# Patient Record
Sex: Female | Born: 1940
Health system: Southern US, Community
[De-identification: ages and names within clinical notes are randomized; demographics above are authoritative.]

## PROBLEM LIST (undated history)

## (undated) DIAGNOSIS — Z2239 Carrier of other specified bacterial diseases: Secondary | ICD-10-CM

## (undated) DIAGNOSIS — M3500 Sicca syndrome, unspecified: Secondary | ICD-10-CM

## (undated) DIAGNOSIS — J449 Chronic obstructive pulmonary disease, unspecified: Secondary | ICD-10-CM

## (undated) DIAGNOSIS — J189 Pneumonia, unspecified organism: Secondary | ICD-10-CM

## (undated) DIAGNOSIS — H9190 Unspecified hearing loss, unspecified ear: Secondary | ICD-10-CM

## (undated) DIAGNOSIS — I10 Essential (primary) hypertension: Secondary | ICD-10-CM

## (undated) DIAGNOSIS — R682 Dry mouth, unspecified: Secondary | ICD-10-CM

## (undated) DIAGNOSIS — M069 Rheumatoid arthritis, unspecified: Secondary | ICD-10-CM

## (undated) DIAGNOSIS — K117 Disturbances of salivary secretion: Secondary | ICD-10-CM

## (undated) DIAGNOSIS — R918 Other nonspecific abnormal finding of lung field: Secondary | ICD-10-CM

## (undated) DIAGNOSIS — J479 Bronchiectasis, uncomplicated: Secondary | ICD-10-CM

## (undated) DIAGNOSIS — Z9221 Personal history of antineoplastic chemotherapy: Secondary | ICD-10-CM

## (undated) DIAGNOSIS — C8590 Non-Hodgkin lymphoma, unspecified, unspecified site: Secondary | ICD-10-CM

## (undated) HISTORY — PX: APPENDECTOMY: SHX54

## (undated) HISTORY — DX: Carrier of other specified bacterial diseases: Z22.39

## (undated) HISTORY — PX: TUBAL LIGATION: SHX77

---

## 2001-11-21 DIAGNOSIS — C8590 Non-Hodgkin lymphoma, unspecified, unspecified site: Secondary | ICD-10-CM

## 2001-11-21 HISTORY — DX: Non-Hodgkin lymphoma, unspecified, unspecified site: C85.90

## 2004-11-10 ENCOUNTER — Ambulatory Visit: Payer: Self-pay | Admitting: Oncology

## 2004-11-21 ENCOUNTER — Ambulatory Visit: Payer: Self-pay | Admitting: Oncology

## 2004-12-28 ENCOUNTER — Ambulatory Visit: Payer: Self-pay | Admitting: Oncology

## 2005-03-30 ENCOUNTER — Ambulatory Visit: Payer: Self-pay | Admitting: Oncology

## 2005-04-21 ENCOUNTER — Ambulatory Visit: Payer: Self-pay | Admitting: Oncology

## 2005-08-25 ENCOUNTER — Ambulatory Visit: Payer: Self-pay | Admitting: Oncology

## 2005-08-26 ENCOUNTER — Ambulatory Visit: Payer: Self-pay | Admitting: Oncology

## 2005-09-21 ENCOUNTER — Ambulatory Visit: Payer: Self-pay | Admitting: Oncology

## 2006-01-19 ENCOUNTER — Ambulatory Visit: Payer: Self-pay | Admitting: Oncology

## 2006-02-09 ENCOUNTER — Ambulatory Visit: Payer: Self-pay

## 2006-02-19 ENCOUNTER — Ambulatory Visit: Payer: Self-pay | Admitting: Oncology

## 2006-04-07 ENCOUNTER — Ambulatory Visit: Payer: Self-pay | Admitting: Oncology

## 2006-04-25 ENCOUNTER — Ambulatory Visit: Payer: Self-pay | Admitting: Oncology

## 2006-04-28 ENCOUNTER — Ambulatory Visit: Payer: Self-pay | Admitting: Oncology

## 2006-06-09 ENCOUNTER — Ambulatory Visit: Payer: Self-pay | Admitting: Oncology

## 2006-06-14 ENCOUNTER — Ambulatory Visit: Payer: Self-pay | Admitting: Oncology

## 2006-09-05 ENCOUNTER — Ambulatory Visit: Payer: Self-pay | Admitting: Oncology

## 2006-09-14 ENCOUNTER — Ambulatory Visit: Payer: Self-pay | Admitting: Oncology

## 2006-09-21 ENCOUNTER — Ambulatory Visit: Payer: Self-pay | Admitting: Oncology

## 2006-12-23 ENCOUNTER — Emergency Department: Payer: Self-pay | Admitting: Unknown Physician Specialty

## 2007-01-12 ENCOUNTER — Ambulatory Visit: Payer: Self-pay | Admitting: Nurse Practitioner

## 2007-01-17 ENCOUNTER — Ambulatory Visit: Payer: Self-pay | Admitting: Oncology

## 2007-01-20 ENCOUNTER — Ambulatory Visit: Payer: Self-pay | Admitting: Internal Medicine

## 2007-01-22 ENCOUNTER — Ambulatory Visit: Payer: Self-pay | Admitting: Internal Medicine

## 2007-02-20 ENCOUNTER — Ambulatory Visit: Payer: Self-pay | Admitting: Internal Medicine

## 2007-03-21 ENCOUNTER — Ambulatory Visit: Payer: Self-pay | Admitting: Oncology

## 2007-03-22 ENCOUNTER — Ambulatory Visit: Payer: Self-pay | Admitting: Oncology

## 2007-03-22 ENCOUNTER — Ambulatory Visit: Payer: Self-pay | Admitting: Internal Medicine

## 2007-04-22 ENCOUNTER — Ambulatory Visit: Payer: Self-pay | Admitting: Oncology

## 2007-04-25 ENCOUNTER — Ambulatory Visit: Payer: Self-pay | Admitting: Oncology

## 2007-04-27 ENCOUNTER — Ambulatory Visit: Payer: Self-pay | Admitting: Oncology

## 2007-05-22 ENCOUNTER — Ambulatory Visit: Payer: Self-pay | Admitting: Oncology

## 2007-05-31 ENCOUNTER — Ambulatory Visit: Payer: Self-pay | Admitting: Internal Medicine

## 2007-07-12 ENCOUNTER — Ambulatory Visit: Payer: Self-pay | Admitting: Specialist

## 2007-11-22 ENCOUNTER — Ambulatory Visit: Payer: Self-pay | Admitting: Oncology

## 2007-11-30 ENCOUNTER — Ambulatory Visit: Payer: Self-pay | Admitting: Oncology

## 2007-12-23 ENCOUNTER — Ambulatory Visit: Payer: Self-pay | Admitting: Oncology

## 2008-02-20 ENCOUNTER — Ambulatory Visit: Payer: Self-pay | Admitting: Oncology

## 2008-02-28 ENCOUNTER — Ambulatory Visit: Payer: Self-pay | Admitting: Oncology

## 2008-03-06 ENCOUNTER — Ambulatory Visit: Payer: Self-pay | Admitting: Oncology

## 2008-03-21 ENCOUNTER — Ambulatory Visit: Payer: Self-pay | Admitting: Oncology

## 2008-03-31 ENCOUNTER — Emergency Department: Payer: Self-pay | Admitting: Emergency Medicine

## 2008-03-31 ENCOUNTER — Other Ambulatory Visit: Payer: Self-pay

## 2008-04-08 ENCOUNTER — Ambulatory Visit: Payer: Self-pay | Admitting: Rheumatology

## 2008-06-21 ENCOUNTER — Ambulatory Visit: Payer: Self-pay | Admitting: Oncology

## 2008-07-14 ENCOUNTER — Ambulatory Visit: Payer: Self-pay | Admitting: Oncology

## 2008-07-22 ENCOUNTER — Ambulatory Visit: Payer: Self-pay | Admitting: Oncology

## 2008-09-16 ENCOUNTER — Emergency Department: Payer: Self-pay | Admitting: Emergency Medicine

## 2008-09-21 ENCOUNTER — Ambulatory Visit: Payer: Self-pay | Admitting: Oncology

## 2008-09-24 ENCOUNTER — Inpatient Hospital Stay: Payer: Self-pay | Admitting: Internal Medicine

## 2008-10-13 ENCOUNTER — Ambulatory Visit: Payer: Self-pay | Admitting: Oncology

## 2008-10-21 ENCOUNTER — Ambulatory Visit: Payer: Self-pay | Admitting: Oncology

## 2008-11-10 ENCOUNTER — Ambulatory Visit: Payer: Self-pay | Admitting: Oncology

## 2008-11-21 ENCOUNTER — Ambulatory Visit: Payer: Self-pay | Admitting: Oncology

## 2008-12-24 ENCOUNTER — Ambulatory Visit: Payer: Self-pay | Admitting: Oncology

## 2008-12-26 ENCOUNTER — Ambulatory Visit: Payer: Self-pay | Admitting: Oncology

## 2009-03-21 ENCOUNTER — Ambulatory Visit: Payer: Self-pay | Admitting: Oncology

## 2009-04-07 ENCOUNTER — Emergency Department: Payer: Self-pay | Admitting: Emergency Medicine

## 2009-04-17 ENCOUNTER — Ambulatory Visit: Payer: Self-pay | Admitting: Oncology

## 2009-04-21 ENCOUNTER — Ambulatory Visit: Payer: Self-pay | Admitting: Oncology

## 2009-05-21 ENCOUNTER — Ambulatory Visit: Payer: Self-pay | Admitting: Oncology

## 2009-05-28 ENCOUNTER — Ambulatory Visit: Payer: Self-pay | Admitting: Oncology

## 2009-06-21 ENCOUNTER — Ambulatory Visit: Payer: Self-pay | Admitting: Oncology

## 2009-11-21 ENCOUNTER — Ambulatory Visit: Payer: Self-pay | Admitting: Internal Medicine

## 2009-11-21 DIAGNOSIS — J479 Bronchiectasis, uncomplicated: Secondary | ICD-10-CM

## 2009-11-21 HISTORY — DX: Bronchiectasis, uncomplicated: J47.9

## 2009-12-14 ENCOUNTER — Ambulatory Visit: Payer: Self-pay | Admitting: Oncology

## 2009-12-22 ENCOUNTER — Ambulatory Visit: Payer: Self-pay | Admitting: Internal Medicine

## 2009-12-22 ENCOUNTER — Ambulatory Visit: Payer: Self-pay | Admitting: Oncology

## 2010-05-07 ENCOUNTER — Ambulatory Visit: Payer: Self-pay | Admitting: Internal Medicine

## 2010-06-25 ENCOUNTER — Ambulatory Visit: Payer: Self-pay | Admitting: Endocrinology

## 2010-09-27 ENCOUNTER — Inpatient Hospital Stay: Payer: Self-pay | Admitting: Endocrinology

## 2011-04-16 ENCOUNTER — Emergency Department: Payer: Self-pay | Admitting: Emergency Medicine

## 2015-02-10 ENCOUNTER — Ambulatory Visit: Payer: Self-pay | Admitting: Rheumatology

## 2015-02-18 DIAGNOSIS — G629 Polyneuropathy, unspecified: Secondary | ICD-10-CM | POA: Insufficient documentation

## 2015-02-18 DIAGNOSIS — R29898 Other symptoms and signs involving the musculoskeletal system: Secondary | ICD-10-CM | POA: Insufficient documentation

## 2015-02-19 DIAGNOSIS — G729 Myopathy, unspecified: Secondary | ICD-10-CM | POA: Insufficient documentation

## 2015-03-25 DIAGNOSIS — R200 Anesthesia of skin: Secondary | ICD-10-CM | POA: Insufficient documentation

## 2015-03-25 DIAGNOSIS — R202 Paresthesia of skin: Secondary | ICD-10-CM | POA: Insufficient documentation

## 2016-01-25 ENCOUNTER — Other Ambulatory Visit: Payer: Self-pay | Admitting: Internal Medicine

## 2016-01-25 ENCOUNTER — Other Ambulatory Visit: Payer: Self-pay | Admitting: Physician Assistant

## 2016-01-25 DIAGNOSIS — R059 Cough, unspecified: Secondary | ICD-10-CM

## 2016-01-25 DIAGNOSIS — R0602 Shortness of breath: Secondary | ICD-10-CM

## 2016-01-25 DIAGNOSIS — R05 Cough: Secondary | ICD-10-CM

## 2016-02-02 ENCOUNTER — Ambulatory Visit
Admission: RE | Admit: 2016-02-02 | Discharge: 2016-02-02 | Disposition: A | Discharge status: Home / Self Care | Payer: Medicare Other | Source: Ambulatory Visit | Attending: Physician Assistant | Admitting: Physician Assistant

## 2016-02-02 DIAGNOSIS — R059 Cough, unspecified: Secondary | ICD-10-CM

## 2016-02-02 DIAGNOSIS — R918 Other nonspecific abnormal finding of lung field: Secondary | ICD-10-CM | POA: Insufficient documentation

## 2016-02-02 DIAGNOSIS — R0602 Shortness of breath: Secondary | ICD-10-CM | POA: Diagnosis not present

## 2016-02-02 DIAGNOSIS — R05 Cough: Secondary | ICD-10-CM | POA: Diagnosis present

## 2016-02-02 DIAGNOSIS — K8020 Calculus of gallbladder without cholecystitis without obstruction: Secondary | ICD-10-CM | POA: Diagnosis not present

## 2016-02-02 DIAGNOSIS — J479 Bronchiectasis, uncomplicated: Secondary | ICD-10-CM | POA: Diagnosis not present

## 2016-02-20 HISTORY — PX: BRONCHOSCOPY: SUR163

## 2016-03-16 ENCOUNTER — Encounter
Admission: RE | Disposition: A | Discharge status: Home / Self Care | Payer: Self-pay | Source: Ambulatory Visit | Attending: Internal Medicine

## 2016-03-16 ENCOUNTER — Ambulatory Visit
Admission: RE | Admit: 2016-03-16 | Discharge: 2016-03-16 | Disposition: A | Discharge status: Home / Self Care | Payer: Medicare Other | Source: Ambulatory Visit | Attending: Internal Medicine | Admitting: Internal Medicine

## 2016-03-16 DIAGNOSIS — Z888 Allergy status to other drugs, medicaments and biological substances status: Secondary | ICD-10-CM | POA: Diagnosis not present

## 2016-03-16 DIAGNOSIS — Z8572 Personal history of non-Hodgkin lymphomas: Secondary | ICD-10-CM | POA: Diagnosis not present

## 2016-03-16 DIAGNOSIS — Z79899 Other long term (current) drug therapy: Secondary | ICD-10-CM | POA: Insufficient documentation

## 2016-03-16 DIAGNOSIS — Z9221 Personal history of antineoplastic chemotherapy: Secondary | ICD-10-CM | POA: Diagnosis not present

## 2016-03-16 DIAGNOSIS — M069 Rheumatoid arthritis, unspecified: Secondary | ICD-10-CM | POA: Diagnosis not present

## 2016-03-16 DIAGNOSIS — J449 Chronic obstructive pulmonary disease, unspecified: Secondary | ICD-10-CM | POA: Insufficient documentation

## 2016-03-16 DIAGNOSIS — J9811 Atelectasis: Secondary | ICD-10-CM | POA: Diagnosis not present

## 2016-03-16 DIAGNOSIS — J479 Bronchiectasis, uncomplicated: Secondary | ICD-10-CM | POA: Diagnosis present

## 2016-03-16 DIAGNOSIS — R911 Solitary pulmonary nodule: Secondary | ICD-10-CM | POA: Diagnosis present

## 2016-03-16 HISTORY — DX: Rheumatoid arthritis, unspecified: M06.9

## 2016-03-16 HISTORY — DX: Non-Hodgkin lymphoma, unspecified, unspecified site: C85.90

## 2016-03-16 HISTORY — DX: Disturbances of salivary secretion: K11.7

## 2016-03-16 HISTORY — PX: FLEXIBLE BRONCHOSCOPY: SHX5094

## 2016-03-16 HISTORY — DX: Dry mouth, unspecified: R68.2

## 2016-03-16 SURGERY — BRONCHOSCOPY, FLEXIBLE
Anesthesia: Moderate Sedation | Laterality: Bilateral

## 2016-03-16 MED ORDER — MIDAZOLAM HCL 5 MG/5ML IJ SOLN
INTRAMUSCULAR | Status: AC
  Filled 2016-03-16: qty 10

## 2016-03-16 MED ORDER — FENTANYL CITRATE (PF) 100 MCG/2ML IJ SOLN
INTRAMUSCULAR | Status: AC | PRN
  Administered 2016-03-16: 50 ug via INTRAVENOUS

## 2016-03-16 MED ORDER — IPRATROPIUM-ALBUTEROL 0.5-2.5 (3) MG/3ML IN SOLN
3.0000 mL | Freq: Once | RESPIRATORY_TRACT | Status: AC
  Administered 2016-03-16: 3 mL via RESPIRATORY_TRACT

## 2016-03-16 MED ORDER — FENTANYL CITRATE (PF) 100 MCG/2ML IJ SOLN
INTRAMUSCULAR | Status: AC
  Filled 2016-03-16: qty 4

## 2016-03-16 MED ORDER — IPRATROPIUM-ALBUTEROL 0.5-2.5 (3) MG/3ML IN SOLN
RESPIRATORY_TRACT | Status: AC
  Administered 2016-03-16: 3 mL
  Filled 2016-03-16: qty 3

## 2016-03-16 MED ORDER — MIDAZOLAM HCL 2 MG/2ML IJ SOLN
INTRAMUSCULAR | Status: AC | PRN
  Administered 2016-03-16: 2 mg via INTRAVENOUS

## 2016-03-16 MED ORDER — SODIUM CHLORIDE 0.9 % IV SOLN
INTRAVENOUS | Status: DC
  Administered 2016-03-16: 13:00:00 via INTRAVENOUS

## 2016-03-16 NOTE — Op Note (Signed)
[facility] Patient Name: Breanna Christian Procedure Date: 03/16/2016 12:43 PM MRN: [id number] Account #: [id number] Date of Birth: [birth year] Admit Type: Outpatient Age: 75 Room: [facility] PROCEDURE RM 01 Gender: Female Note Status: Finalized Attending MD: Allyne Gee , MD Procedure:         Bronchoscopy Indications:       Bilateral atelectasis Providers:         Allyne Gee, MD Referring MD:       Medicines:         Midazolam 2 mg IV, Fentanyl 50 mcg IV Complications:     No immediate complications Procedure:         Pre-Anesthesia Assessment:                    - A History and Physical has been performed. The patient's                     medications, allergies and sensitivities have been                     reviewed.                    - The risks and benefits of the procedure and the sedation                     options and risks were discussed with the patient. All                     questions were answered and informed consent was obtained.                    - Pre-procedure physical examination revealed no                     contraindications to sedation.                    - ASA Grade Assessment: II - A patient with mild systemic                     disease.                    - After reviewing the risks and benefits, the patient was                     deemed in satisfactory condition to undergo the procedure.                    - The anesthesia plan was to use moderate                     sedation/analgesia.                    - Immediately prior to administration of medications, the                     patient was re-assessed for adequacy to receive sedatives.                    - The heart rate, respiratory rate, oxygen saturations,                     blood pressure, adequacy of pulmonary ventilation, and  response to care were monitored throughout the procedure.                    - The physical status of the patient was  re-assessed after                     the procedure.                    After obtaining informed consent, the bronchoscope was                     passed under direct vision. Throughout the procedure, the                     patient's blood pressure, pulse, and oxygen saturations                     were monitored continuously. the Bronchoscope Olympus                     [id number] was introduced through the right                     nostril and advanced to the tracheobronchial tree of both                     lungs. The procedure was accomplished without difficulty.                     The patient tolerated the procedure well. The total                     duration of the procedure was 20 minutes. Findings:      The nasopharynx/oropharynx appears normal. The larynx appears normal.       The vocal cords appear normal. The subglottic space is normal. The       trachea is of normal caliber. The carina is sharp. The tracheobronchial       tree of the left lung was examined to at least the first subsegmental       level. Bronchial mucosa and anatomy in the left lung are normal; there       are no endobronchial lesions, and no secretions.      The nasopharynx/oropharynx appears normal. The larynx appears normal.       The vocal cords appear normal. The subglottic space is normal. The       trachea is of normal caliber. The carina is sharp. The tracheobronchial       tree of the right lung was examined to at least the first subsegmental       level. Bronchial mucosa and anatomy in the right lung are normal; there       are no endobronchial lesions, and no secretions.      Bronchoalveolar lavage was performed in the RML lateral segment (B4) and       in the RML medial segment (B5) of the lung and sent for cell count,       bacterial culture, viral smears & culture, and fungal & AFB analysis and       cytology. 120 mL of fluid were instilled. 60 mL were returned. The       return was  blood-tinged. There were no mucoid plugs in the return fluid.       Multiple specimens were  obtained, and each sent for analysis.      Verification of patient identification for the specimen was done by the       physician and nurse using the patient's name, birth date and medical       record number. Impression:        - Bilateral atelectasis                    - The left lung was normal.                    - The right lung was normal.                    - Bronchoalveolar lavage was performed.                    - The examination was normal. Recommendation:    - Await BAL results. Devona Konig, MD Allyne Gee, MD 03/16/2016 1:47:15 PM This report has been signed electronically. Number of Addenda: 0 Note Initiated On: 03/16/2016 12:43 PM      [facility]

## 2016-03-16 NOTE — Sedation Documentation (Signed)
Patient tolerated procedure well. Currently reporting no pain, total of 59mcg of fentanyl and 2mg  versed administered. Will transport to recovery.

## 2016-03-18 LAB — CYTOLOGY - NON PAP

## 2016-03-19 LAB — CULTURE, RESPIRATORY W GRAM STAIN: Special Requests: NORMAL

## 2016-03-19 LAB — CULTURE, RESPIRATORY

## 2016-04-06 LAB — CULTURE, FUNGUS WITHOUT SMEAR: Special Requests: NORMAL

## 2016-04-25 ENCOUNTER — Encounter: Payer: Medicare Other | Attending: Internal Medicine | Admitting: *Deleted

## 2016-04-25 VITALS — Ht 62.5 in | Wt 108.7 lb

## 2016-04-25 DIAGNOSIS — J449 Chronic obstructive pulmonary disease, unspecified: Secondary | ICD-10-CM | POA: Diagnosis not present

## 2016-04-26 NOTE — Patient Instructions (Signed)
Patient Instructions  Patient Details  Name: Breanna Christian MRN: [id number] Date of Birth: [birth year] Referring Provider:  Lavera Guise, MD  Below are the personal goals you chose as well as exercise and nutrition goals. Our goal is to help you keep on track towards obtaining and maintaining your goals. We will be discussing your progress on these goals with you throughout the program.  Initial Exercise Prescription:     Initial Exercise Prescription - 04/25/16 1500    Date of Initial Exercise RX and Referring Provider   Date 04/25/16   Treadmill   MPH 2   Grade 0   Minutes 10   NuStep   Level 3   Watts 30   Minutes 15   Biostep-RELP   Level 2   Watts 25   Minutes 10   Prescription Details   Frequency (times per week) 3   Duration Progress to 30 minutes of continuous aerobic without signs/symptoms of physical distress   Intensity   THRR 40-80% of Max Heartrate 135-149   Ratings of Perceived Exertion 11-15   Perceived Dyspnea 0-4   Progression   Progression Continue progressive overload as per policy without signs/symptoms or physical distress.   Resistance Training   Training Prescription Yes   Weight 2   Reps 10-15      Exercise Goals: Frequency: Be able to perform aerobic exercise three times per week working toward 3-5 days per week.  Intensity: Work with a perceived exertion of 11 (fairly light) - 15 (hard) as tolerated. Follow your new exercise prescription and watch for changes in prescription as you progress with the program. Changes will be reviewed with you when they are made.  Duration: You should be able to do 30 minutes of continuous aerobic exercise in addition to a 5 minute warm-up and a 5 minute cool-down routine.  Nutrition Goals: Your personal nutrition goals will be established when you do your nutrition analysis with the dietician.  The following are nutrition guidelines to follow: Cholesterol < 200mg /day Sodium < 1500mg /day Fiber: Women  over 50 yrs - 21 grams per day  Personal Goals:     Personal Goals and Risk Factors at Admission - 04/25/16 1456    Core Components/Risk Factors/Patient Goals on Admission    Weight Management Weight Gain;Yes   Intervention Weight Management: Develop a combined nutrition and exercise program designed to reach desired caloric intake, while maintaining appropriate intake of nutrient and fiber, sodium and fats, and appropriate energy expenditure required for the weight goal.   Admit Weight 109 lb (49.442 kg)   Goal Weight: Short Term 115 lb (52.164 kg)   Goal Weight: Long Term 125 lb (56.7 kg)   Expected Outcomes Short Term: Continue to assess and modify interventions until short term weight is achieved;Long Term: Adherence to nutrition and physical activity/exercise program aimed toward attainment of established weight goal;Weight Gain: Understanding of general recommendations for a high calorie, high protein meal plan that promotes weight gain by distributing calorie intake throughout the day with the consumption for 4-5 meals, snacks, and/or supplements   Sedentary Yes   Intervention Provide advice, education, support and counseling about physical activity/exercise needs.;Develop an individualized exercise prescription for aerobic and resistive training based on initial evaluation findings, risk stratification, comorbidities and participant's personal goals.   Expected Outcomes Achievement of increased cardiorespiratory fitness and enhanced flexibility, muscular endurance and strength shown through measurements of functional capacity and personal statement of participant.   Increase Strength and Stamina Yes  Intervention Provide advice, education, support and counseling about physical activity/exercise needs.;Develop an individualized exercise prescription for aerobic and resistive training based on initial evaluation findings, risk stratification, comorbidities and participant's personal goals.    Expected Outcomes Achievement of increased cardiorespiratory fitness and enhanced flexibility, muscular endurance and strength shown through measurements of functional capacity and personal statement of participant.   Improve shortness of breath with ADL's Yes   Intervention Provide education, individualized exercise plan and daily activity instruction to help decrease symptoms of SOB with activities of daily living.   Expected Outcomes Short Term: Achieves a reduction of symptoms when performing activities of daily living.   Develop more efficient breathing techniques such as purse lipped breathing and diaphragmatic breathing; and practicing self-pacing with activity Yes   Intervention Provide education, demonstration and support about specific breathing techniuqes utilized for more efficient breathing. Include techniques such as pursed lipped breathing, diaphragmatic breathing and self-pacing activity.   Expected Outcomes Short Term: Participant will be able to demonstrate and use breathing techniques as needed throughout daily activities.   Increase knowledge of respiratory medications and ability to use respiratory devices properly  Yes   Intervention Provide education and demonstration as needed of appropriate use of medications, inhalers, and oxygen therapy.   Expected Outcomes Short Term: Achieves understanding of medications use. Understands that oxygen is a medication prescribed by physician. Demonstrates appropriate use of inhaler and oxygen therapy.      Tobacco Use Initial Evaluation: History  Smoking status  . Never Smoker   Smokeless tobacco  . Not on file    Copy of goals given to participant.

## 2016-04-26 NOTE — Progress Notes (Signed)
Pulmonary Individual Treatment Plan  Patient Details  Name: BRAELYN HINCK MRN: [id number] Date of Birth: [birth year] Referring Provider:    Initial Encounter Date:       Pulmonary Rehab from 04/25/2016 in [facility] Cardiac and Pulmonary Rehab   Date  04/25/16      Visit Diagnosis: COPD, moderate ([place])  Patient's Home Medications on Admission:  Current outpatient prescriptions:  .  aspirin EC 81 MG tablet, Take by mouth., Disp: , Rfl:  .  Calcium Carbonate-Vitamin D (CALCIUM-VITAMIN D) 500-200 MG-UNIT tablet, Take by mouth., Disp: , Rfl:  .  Cyanocobalamin (RA VITAMIN B-12 TR) 1000 MCG TBCR, Take by mouth., Disp: , Rfl:  .  diltiazem (TIAZAC) 240 MG 24 hr capsule, , Disp: , Rfl:  .  Fluticasone-Salmeterol (ADVAIR) 250-50 MCG/DOSE AEPB, Inhale 1 puff into the lungs 2 (two) times daily., Disp: , Rfl:  .  folic acid (FOLVITE) 1 MG tablet, Take by mouth., Disp: , Rfl:  .  ipratropium-albuterol (DUONEB) 0.5-2.5 (3) MG/3ML SOLN, Take 3 mLs by nebulization., Disp: , Rfl:  .  Multiple Vitamin (MULTI-VITAMINS) TABS, Take by mouth. Reported on 04/25/2016, Disp: , Rfl:  .  roflumilast (DALIRESP) 500 MCG TABS tablet, Take 500 mcg by mouth daily., Disp: , Rfl:  .  tiotropium (SPIRIVA) 18 MCG inhalation capsule, Place 18 mcg into inhaler and inhale daily., Disp: , Rfl:  .  triamterene-hydrochlorothiazide (DYAZIDE) 37.5-25 MG capsule, Take by mouth., Disp: , Rfl:  .  Vitamin D, Ergocalciferol, (DRISDOL) 50000 units CAPS capsule, Take by mouth., Disp: , Rfl:  .  benzonatate (TESSALON) 100 MG capsule, Take by mouth 3 (three) times daily as needed for cough., Disp: , Rfl:  .  predniSONE (DELTASONE) 10 MG tablet, Take 10 mg by mouth daily with breakfast. Reported on 04/25/2016, Disp: , Rfl:   Past Medical History: Past Medical History  Diagnosis Date  . Non-Hodgkin lymphoma ([place]) 2003  . Rheumatoid arthritis ([place])   . Xerostomia     Tobacco Use: History  Smoking status  . Never Smoker   Smokeless  tobacco  . Not on file    Labs: Recent Review Flowsheet Data    There is no flowsheet data to display.       ADL [facility]:     Pulmonary Assessment Scores      04/26/16 1021       ADL [facility]   ADL Phase Entry     SOB Score total 48     Rest 1     Walk 2     Stairs 5     Bath 2     Dress 1     Shop 3        Pulmonary Function Assessment:     Pulmonary Function Assessment - 02/10/16 1017    Pulmonary Function Tests   RV% 154 %   DLCO% 47 %   Initial Spirometry Results   FVC% 55 %   FEV1% 53 %   FEV1/FVC Ratio 77   Post Bronchodilator Spirometry Results   FVC% 68 %   FEV1% 56 %   FEV1/FVC Ratio 66   Breath   Shortness of Breath Yes;Limiting activity      Exercise Target Goals: Date: 04/25/16  Exercise Program Goal: Individual exercise prescription set with THRR, safety & activity barriers. Participant demonstrates ability to understand and report RPE using BORG scale, to self-measure pulse accurately, and to acknowledge the importance of the exercise prescription.  Exercise Prescription Goal: Starting with aerobic  activity 30 plus minutes a day, 3 days per week for initial exercise prescription. Provide home exercise prescription and guidelines that participant acknowledges understanding prior to discharge.  Activity Barriers & Risk Stratification:     Activity Barriers & Cardiac Risk Stratification - 04/25/16 1445    Activity Barriers & Cardiac Risk Stratification   Activity Barriers Deconditioning;Muscular Weakness;Shortness of Breath      6 Minute Walk:     6 Minute Walk      04/25/16 1542       6 Minute Walk   Phase Initial     Distance 1160 feet     Distance % Change 0 %     Walk Time 6 minutes     # of Rest Breaks 0     MPH 2.2     METS 2.68     RPE 12     Perceived Dyspnea  2     Symptoms No     Resting HR 114 bpm     Resting BP 104/62 mmHg     Max Ex. HR 140 bpm     Max Ex. BP 130/62 mmHg        Initial Exercise  Prescription:     Initial Exercise Prescription - 04/25/16 1500    Date of Initial Exercise RX and Referring Provider   Date 04/25/16   Treadmill   MPH 2   Grade 0   Minutes 10   NuStep   Level 3   Watts 30   Minutes 15   Biostep-RELP   Level 2   Watts 25   Minutes 10   Prescription Details   Frequency (times per week) 3   Duration Progress to 30 minutes of continuous aerobic without signs/symptoms of physical distress   Intensity   THRR 40-80% of Max Heartrate 135-149   Ratings of Perceived Exertion 11-15   Perceived Dyspnea 0-4   Progression   Progression Continue progressive overload as per policy without signs/symptoms or physical distress.   Resistance Training   Training Prescription Yes   Weight 2   Reps 10-15      Perform Capillary Blood Glucose checks as needed.  Exercise Prescription Changes:   Exercise Comments:   Discharge Exercise Prescription (Final Exercise Prescription Changes):    Nutrition:  Target Goals: Understanding of nutrition guidelines, daily intake of sodium 1500mg , cholesterol 200mg , calories 30% from fat and 7% or less from saturated fats, daily to have 5 or more servings of fruits and vegetables.  Biometrics:     Pre Biometrics - 04/25/16 1554    Pre Biometrics   Height 5' 2.5" (1.588 m)   Weight 108 lb 11.2 oz (49.306 kg)   Waist Circumference 28.3 inches   Hip Circumference 34 inches   Waist to Hip Ratio 0.83 %   BMI (Calculated) 19.6       Nutrition Therapy Plan and Nutrition Goals:     Nutrition Therapy & Goals - 04/25/16 1440    Intervention Plan   Intervention Prescribe, educate and counsel regarding individualized specific dietary modifications aiming towards targeted core components such as weight, hypertension, lipid management, diabetes, heart failure and other comorbidities.   Expected Outcomes Short Term Goal: Understand basic principles of dietary content, such as calories, fat, sodium, cholesterol and  nutrients.;Short Term Goal: A plan has been developed with personal nutrition goals set during dietitian appointment.;Long Term Goal: Adherence to prescribed nutrition plan.      Nutrition Discharge: Rate Your Plate Scores:  Psychosocial: Target Goals: Acknowledge presence or absence of depression, maximize coping skills, provide positive support system. Participant is able to verbalize types and ability to use techniques and skills needed for reducing stress and depression.  Initial Review & Psychosocial Screening:     Initial Psych Review & Screening - 04/25/16 1441    Initial Review   Current issues with Current Sleep Concerns   Family Dynamics   Good Support System? Yes  Children nearby   Concerns Recent loss of significant other   Comments Spouse died this past 15-Mar-2023  Married since 1973/03/14         Barriers   Psychosocial barriers to participate in program There are no identifiable barriers or psychosocial needs.;The patient should benefit from training in stress management and relaxation.   Screening Interventions   Interventions Encouraged to exercise      Quality of Life Scores:     Quality of Life - 04/26/16 1027    Quality of Life Scores   Health/Function Pre 11.53 %   Socioeconomic Pre 17.1 %   Psych/Spiritual Pre 23.71 %   Family Pre 25.5 %   GLOBAL Pre 16.98 %      PHQ-9:     Recent Review Flowsheet Data    Depression screen [facility] 2/9 04/25/2016   Decreased Interest 0   Down, Depressed, Hopeless 0   PHQ - 2 Score 0   Altered sleeping 3   Tired, decreased energy 3   Change in appetite 3   Feeling bad or failure about yourself  0   Trouble concentrating 0   Moving slowly or fidgety/restless 0   Suicidal thoughts 0   PHQ-9 Score 9   Difficult doing work/chores Somewhat difficult      Psychosocial Evaluation and Intervention:   Psychosocial Re-Evaluation:  Education: Education Goals: Education classes will be provided on a weekly basis, covering  required topics. Participant will state understanding/return demonstration of topics presented.  Learning Barriers/Preferences:     Learning Barriers/Preferences - 04/25/16 1445    Learning Barriers/Preferences   Learning Barriers None   Learning Preferences None      Education Topics: Initial Evaluation Education: - Verbal, written and demonstration of respiratory meds, RPE/PD scales, oximetry and breathing techniques. Instruction on use of nebulizers and MDIs: cleaning and proper use, rinsing mouth with steroid doses and importance of monitoring MDI activations.   General Nutrition Guidelines/Fats and Fiber: -Group instruction provided by verbal, written material, models and posters to present the general guidelines for heart healthy nutrition. Gives an explanation and review of dietary fats and fiber.   Controlling Sodium/Reading Food Labels: -Group verbal and written material supporting the discussion of sodium use in heart healthy nutrition. Review and explanation with models, verbal and written materials for utilization of the food label.   Exercise Physiology & Risk Factors: - Group verbal and written instruction with models to review the exercise physiology of the cardiovascular system and associated critical values. Details cardiovascular disease risk factors and the goals associated with each risk factor.   Aerobic Exercise & Resistance Training: - Gives group verbal and written discussion on the health impact of inactivity. On the components of aerobic and resistive training programs and the benefits of this training and how to safely progress through these programs.   Flexibility, Balance, General Exercise Guidelines: - Provides group verbal and written instruction on the benefits of flexibility and balance training programs. Provides general exercise guidelines with specific guidelines to those with heart or lung disease.  Demonstration and skill practice  provided.   Stress Management: - Provides group verbal and written instruction about the health risks of elevated stress, cause of high stress, and healthy ways to reduce stress.   Depression: - Provides group verbal and written instruction on the correlation between heart/lung disease and depressed mood, treatment options, and the stigmas associated with seeking treatment.   Exercise & Equipment Safety: - Individual verbal instruction and demonstration of equipment use and safety with use of the equipment.          Pulmonary Rehab from 04/25/2016 in [facility] Cardiac and Pulmonary Rehab   Date  04/25/16   Educator  SB   Instruction Review Code  2- meets goals/outcomes      Infection Prevention: - Provides verbal and written material to individual with discussion of infection control including proper hand washing and proper equipment cleaning during exercise session.      Pulmonary Rehab from 04/25/2016 in [facility] Cardiac and Pulmonary Rehab   Date  04/25/16   Educator  Sb   Instruction Review Code  2- meets goals/outcomes      Falls Prevention: - Provides verbal and written material to individual with discussion of falls prevention and safety.      Pulmonary Rehab from 04/25/2016 in [facility] Cardiac and Pulmonary Rehab   Date  04/25/16   Educator  Sb   Instruction Review Code  2- meets goals/outcomes      Diabetes: - Individual verbal and written instruction to review signs/symptoms of diabetes, desired ranges of glucose level fasting, after meals and with exercise. Advice that pre and post exercise glucose checks will be done for 3 sessions at entry of program.   Chronic Lung Diseases: - Group verbal and written instruction to review new updates, new respiratory medications, new advancements in procedures and treatments. Provide informative websites and "800" numbers of self-education.   Lung Procedures: - Group verbal and written instruction to describe testing methods done to  diagnose lung disease. Review the outcome of test results. Describe the treatment choices: Pulmonary Function Tests, ABGs and oximetry.   Energy Conservation: - Provide group verbal and written instruction for methods to conserve energy, plan and organize activities. Instruct on pacing techniques, use of adaptive equipment and posture/positioning to relieve shortness of breath.   Triggers: - Group verbal and written instruction to review types of environmental controls: home humidity, furnaces, filters, dust mite/pet prevention, HEPA vacuums. To discuss weather changes, air quality and the benefits of nasal washing.   Exacerbations: - Group verbal and written instruction to provide: warning signs, infection symptoms, calling MD promptly, preventive modes, and value of vaccinations. Review: effective airway clearance, coughing and/or vibration techniques. Create an Sports administrator.   Oxygen: - Individual and group verbal and written instruction on oxygen therapy. Includes supplement oxygen, available portable oxygen systems, continuous and intermittent flow rates, oxygen safety, concentrators, and Medicare reimbursement for oxygen.   Respiratory Medications: - Group verbal and written instruction to review medications for lung disease. Drug class, frequency, complications, importance of spacers, rinsing mouth after steroid MDI's, and proper cleaning methods for nebulizers.   AED/CPR: - Group verbal and written instruction with the use of models to demonstrate the basic use of the AED with the basic ABC's of resuscitation.   Breathing Retraining: - Provides individuals verbal and written instruction on purpose, frequency, and proper technique of diaphragmatic breathing and pursed-lipped breathing. Applies individual practice skills.   Anatomy and Physiology of the Lungs: - Group verbal and written  instruction with the use of models to provide basic lung anatomy and physiology related to  function, structure and complications of lung disease.   Heart Failure: - Group verbal and written instruction on the basics of heart failure: signs/symptoms, treatments, explanation of ejection fraction, enlarged heart and cardiomyopathy.   Sleep Apnea: - Individual verbal and written instruction to review Obstructive Sleep Apnea. Review of risk factors, methods for diagnosing and types of masks and machines for OSA.   Anxiety: - Provides group, verbal and written instruction on the correlation between heart/lung disease and anxiety, treatment options, and management of anxiety.   Relaxation: - Provides group, verbal and written instruction about the benefits of relaxation for patients with heart/lung disease. Also provides patients with examples of relaxation techniques.   Knowledge Questionnaire Score:     Knowledge Questionnaire Score - 04/26/16 1013    Knowledge Questionnaire Score   Pre Score 3/10       Core Components/Risk Factors/Patient Goals at Admission:     Personal Goals and Risk Factors at Admission - 04/25/16 1456    Core Components/Risk Factors/Patient Goals on Admission    Weight Management Weight Gain;Yes   Intervention Weight Management: Develop a combined nutrition and exercise program designed to reach desired caloric intake, while maintaining appropriate intake of nutrient and fiber, sodium and fats, and appropriate energy expenditure required for the weight goal.   Admit Weight 109 lb (49.442 kg)   Goal Weight: Short Term 115 lb (52.164 kg)   Goal Weight: Long Term 125 lb (56.7 kg)   Expected Outcomes Short Term: Continue to assess and modify interventions until short term weight is achieved;Long Term: Adherence to nutrition and physical activity/exercise program aimed toward attainment of established weight goal;Weight Gain: Understanding of general recommendations for a high calorie, high protein meal plan that promotes weight gain by distributing  calorie intake throughout the day with the consumption for 4-5 meals, snacks, and/or supplements   Sedentary Yes   Intervention Provide advice, education, support and counseling about physical activity/exercise needs.;Develop an individualized exercise prescription for aerobic and resistive training based on initial evaluation findings, risk stratification, comorbidities and participant's personal goals.   Expected Outcomes Achievement of increased cardiorespiratory fitness and enhanced flexibility, muscular endurance and strength shown through measurements of functional capacity and personal statement of participant.   Increase Strength and Stamina Yes   Intervention Provide advice, education, support and counseling about physical activity/exercise needs.;Develop an individualized exercise prescription for aerobic and resistive training based on initial evaluation findings, risk stratification, comorbidities and participant's personal goals.   Expected Outcomes Achievement of increased cardiorespiratory fitness and enhanced flexibility, muscular endurance and strength shown through measurements of functional capacity and personal statement of participant.   Improve shortness of breath with ADL's Yes   Intervention Provide education, individualized exercise plan and daily activity instruction to help decrease symptoms of SOB with activities of daily living.   Expected Outcomes Short Term: Achieves a reduction of symptoms when performing activities of daily living.   Develop more efficient breathing techniques such as purse lipped breathing and diaphragmatic breathing; and practicing self-pacing with activity Yes   Intervention Provide education, demonstration and support about specific breathing techniuqes utilized for more efficient breathing. Include techniques such as pursed lipped breathing, diaphragmatic breathing and self-pacing activity.   Expected Outcomes Short Term: Participant will be able to  demonstrate and use breathing techniques as needed throughout daily activities.   Increase knowledge of respiratory medications and ability to use respiratory devices properly  Yes   Intervention Provide education and demonstration as needed of appropriate use of medications, inhalers, and oxygen therapy.   Expected Outcomes Short Term: Achieves understanding of medications use. Understands that oxygen is a medication prescribed by physician. Demonstrates appropriate use of inhaler and oxygen therapy.      Core Components/Risk Factors/Patient Goals Review:    Core Components/Risk Factors/Patient Goals at Discharge (Final Review):    ITP Comments:     ITP Comments      04/25/16 1439           ITP Comments Initial ITP completed today.            Comments:

## 2016-05-02 ENCOUNTER — Encounter: Payer: Self-pay | Admitting: *Deleted

## 2016-05-02 ENCOUNTER — Encounter: Payer: Medicare Other | Admitting: *Deleted

## 2016-05-02 DIAGNOSIS — J449 Chronic obstructive pulmonary disease, unspecified: Secondary | ICD-10-CM

## 2016-05-02 NOTE — Progress Notes (Signed)
Pulmonary Individual Treatment Plan  Patient Details  Name: Breanna Christian MRN: [id number] Date of Birth: 1941-08-12 Referring Provider:    Initial Encounter Date:       Pulmonary Rehab from 04/25/2016 in [facility] Cardiac and Pulmonary Rehab   Date  04/25/16      Visit Diagnosis: COPD, moderate ([place])  Patient's Home Medications on Admission:  Current outpatient prescriptions:  .  aspirin EC 81 MG tablet, Take by mouth., Disp: , Rfl:  .  benzonatate (TESSALON) 100 MG capsule, Take by mouth 3 (three) times daily as needed for cough., Disp: , Rfl:  .  Calcium Carbonate-Vitamin D (CALCIUM-VITAMIN D) 500-200 MG-UNIT tablet, Take by mouth., Disp: , Rfl:  .  Cyanocobalamin (RA VITAMIN B-12 TR) 1000 MCG TBCR, Take by mouth., Disp: , Rfl:  .  diltiazem (TIAZAC) 240 MG 24 hr capsule, , Disp: , Rfl:  .  Fluticasone-Salmeterol (ADVAIR) 250-50 MCG/DOSE AEPB, Inhale 1 puff into the lungs 2 (two) times daily., Disp: , Rfl:  .  folic acid (FOLVITE) 1 MG tablet, Take by mouth., Disp: , Rfl:  .  ipratropium-albuterol (DUONEB) 0.5-2.5 (3) MG/3ML SOLN, Take 3 mLs by nebulization., Disp: , Rfl:  .  Multiple Vitamin (MULTI-VITAMINS) TABS, Take by mouth. Reported on 04/25/2016, Disp: , Rfl:  .  predniSONE (DELTASONE) 10 MG tablet, Take 10 mg by mouth daily with breakfast. Reported on 04/25/2016, Disp: , Rfl:  .  roflumilast (DALIRESP) 500 MCG TABS tablet, Take 500 mcg by mouth daily., Disp: , Rfl:  .  tiotropium (SPIRIVA) 18 MCG inhalation capsule, Place 18 mcg into inhaler and inhale daily., Disp: , Rfl:  .  triamterene-hydrochlorothiazide (DYAZIDE) 37.5-25 MG capsule, Take by mouth., Disp: , Rfl:  .  Vitamin D, Ergocalciferol, (DRISDOL) 50000 units CAPS capsule, Take by mouth., Disp: , Rfl:   Past Medical History: Past Medical History  Diagnosis Date  . Non-Hodgkin lymphoma ([place]) 2003  . Rheumatoid arthritis ([place])   . Xerostomia     Tobacco Use: History  Smoking status  . Never Smoker   Smokeless  tobacco  . Not on file    Labs: Recent Review Flowsheet Data    There is no flowsheet data to display.       ADL [facility]:     Pulmonary Assessment Scores      04/26/16 1021       ADL [facility]SD   ADL Phase Entry     SOB Score total 48     Rest 1     Walk 2     Stairs 5     Bath 2     Dress 1     Shop 3        Pulmonary Function Assessment:     Pulmonary Function Assessment - 02/10/16 1017    Pulmonary Function Tests   RV% 154 %   DLCO% 47 %   Initial Spirometry Results   FVC% 55 %   FEV1% 53 %   FEV1/FVC Ratio 77   Post Bronchodilator Spirometry Results   FVC% 68 %   FEV1% 56 %   FEV1/FVC Ratio 66   Breath   Shortness of Breath Yes;Limiting activity      Exercise Target Goals:    Exercise Program Goal: Individual exercise prescription set with THRR, safety & activity barriers. Participant demonstrates ability to understand and report RPE using BORG scale, to self-measure pulse accurately, and to acknowledge the importance of the exercise prescription.  Exercise Prescription Goal: Starting with aerobic  activity 30 plus minutes a day, 3 days per week for initial exercise prescription. Provide home exercise prescription and guidelines that participant acknowledges understanding prior to discharge.  Activity Barriers & Risk Stratification:     Activity Barriers & Cardiac Risk Stratification - 04/25/16 1445    Activity Barriers & Cardiac Risk Stratification   Activity Barriers Deconditioning;Muscular Weakness;Shortness of Breath      6 Minute Walk:     6 Minute Walk      04/25/16 1542       6 Minute Walk   Phase Initial     Distance 1160 feet     Distance % Change 0 %     Walk Time 6 minutes     # of Rest Breaks 0     MPH 2.2     METS 2.68     RPE 12     Perceived Dyspnea  2     Symptoms No     Resting HR 114 bpm     Resting BP 104/62 mmHg     Max Ex. HR 140 bpm     Max Ex. BP 130/62 mmHg        Initial Exercise Prescription:      Initial Exercise Prescription - 04/25/16 1500    Date of Initial Exercise RX and Referring Provider   Date 04/25/16   Treadmill   MPH 2   Grade 0   Minutes 10   NuStep   Level 3   Watts 30   Minutes 15   Biostep-RELP   Level 2   Watts 25   Minutes 10   Prescription Details   Frequency (times per week) 3   Duration Progress to 30 minutes of continuous aerobic without signs/symptoms of physical distress   Intensity   THRR 40-80% of Max Heartrate 135-149   Ratings of Perceived Exertion 11-15   Perceived Dyspnea 0-4   Progression   Progression Continue progressive overload as per policy without signs/symptoms or physical distress.   Resistance Training   Training Prescription Yes   Weight 2   Reps 10-15      Perform Capillary Blood Glucose checks as needed.  Exercise Prescription Changes:     Exercise Prescription Changes      05/02/16 1200           Response to Exercise   Blood Pressure (Admit) 124/70 mmHg       Blood Pressure (Exercise) 136/70 mmHg       Blood Pressure (Exit) 106/62 mmHg       Heart Rate (Admit) 95 bpm       Heart Rate (Exercise) 119 bpm       Heart Rate (Exit) 94 bpm       Oxygen Saturation (Admit) 97 %       Oxygen Saturation (Exercise) 93 %       Oxygen Saturation (Exit) 97 %       Rating of Perceived Exertion (Exercise) 11       Perceived Dyspnea (Exercise) 2       Symptoms none       Duration Progress to 30 minutes of continuous aerobic without signs/symptoms of physical distress       Intensity THRR unchanged  135-149       Progression   Progression Continue progressive overload as per policy without signs/symptoms or physical distress.       Resistance Training   Training Prescription Yes       Weight  2       Reps 10-15       Treadmill   MPH 2       Grade 0       Minutes 10       NuStep   Level 3  Omit today due to time. Was oriented to machine.        Watts 30       Minutes 15       Biostep-RELP   Level 2       Watts  25       Minutes 10          Exercise Comments:     Exercise Comments      05/02/16 1158           Exercise Comments First full day of exercise!  Patient was oriented to gym and equipment including functions, settings, policies, and procedures.  Patient's individual exercise prescription and treatment plan were reviewed.  All starting workloads were established based on the results of the 6 minute walk test done at initial orientation visit.  The plan for exercise progression was also introduced and progression will be customized based on patient's performance and goals.          Discharge Exercise Prescription (Final Exercise Prescription Changes):     Exercise Prescription Changes - 05/02/16 1200    Response to Exercise   Blood Pressure (Admit) 124/70 mmHg   Blood Pressure (Exercise) 136/70 mmHg   Blood Pressure (Exit) 106/62 mmHg   Heart Rate (Admit) 95 bpm   Heart Rate (Exercise) 119 bpm   Heart Rate (Exit) 94 bpm   Oxygen Saturation (Admit) 97 %   Oxygen Saturation (Exercise) 93 %   Oxygen Saturation (Exit) 97 %   Rating of Perceived Exertion (Exercise) 11   Perceived Dyspnea (Exercise) 2   Symptoms none   Duration Progress to 30 minutes of continuous aerobic without signs/symptoms of physical distress   Intensity THRR unchanged  135-149   Progression   Progression Continue progressive overload as per policy without signs/symptoms or physical distress.   Resistance Training   Training Prescription Yes   Weight 2   Reps 10-15   Treadmill   MPH 2   Grade 0   Minutes 10   NuStep   Level 3  Omit today due to time. Was oriented to machine.    Watts 30   Minutes 15   Biostep-RELP   Level 2   Watts 25   Minutes 10       Nutrition:  Target Goals: Understanding of nutrition guidelines, daily intake of sodium 1500mg , cholesterol 200mg , calories 30% from fat and 7% or less from saturated fats, daily to have 5 or more servings of fruits and  vegetables.  Biometrics:     Pre Biometrics - 04/25/16 1554    Pre Biometrics   Height 5' 2.5" (1.588 m)   Weight 108 lb 11.2 oz (49.306 kg)   Waist Circumference 28.3 inches   Hip Circumference 34 inches   Waist to Hip Ratio 0.83 %   BMI (Calculated) 19.6       Nutrition Therapy Plan and Nutrition Goals:     Nutrition Therapy & Goals - 04/25/16 1440    Intervention Plan   Intervention Prescribe, educate and counsel regarding individualized specific dietary modifications aiming towards targeted core components such as weight, hypertension, lipid management, diabetes, heart failure and other comorbidities.   Expected Outcomes Short Term Goal: Understand basic  principles of dietary content, such as calories, fat, sodium, cholesterol and nutrients.;Short Term Goal: A plan has been developed with personal nutrition goals set during dietitian appointment.;Long Term Goal: Adherence to prescribed nutrition plan.      Nutrition Discharge: Rate Your Plate Scores:   Psychosocial: Target Goals: Acknowledge presence or absence of depression, maximize coping skills, provide positive support system. Participant is able to verbalize types and ability to use techniques and skills needed for reducing stress and depression.  Initial Review & Psychosocial Screening:     Initial Psych Review & Screening - 04/25/16 1441    Initial Review   Current issues with Current Sleep Concerns   Family Dynamics   Good Support System? Yes  Children nearby   Concerns Recent loss of significant other   Comments Spouse died this past 2023-03-16  Married since 1973-03-15         Barriers   Psychosocial barriers to participate in program There are no identifiable barriers or psychosocial needs.;The patient should benefit from training in stress management and relaxation.   Screening Interventions   Interventions Encouraged to exercise      Quality of Life Scores:     Quality of Life - 04/26/16 1027    Quality  of Life Scores   Health/Function Pre 11.53 %   Socioeconomic Pre 17.1 %   Psych/Spiritual Pre 23.71 %   Family Pre 25.5 %   GLOBAL Pre 16.98 %      PHQ-9:     Recent Review Flowsheet Data    Depression screen [facility] 2/9 04/25/2016   Decreased Interest 0   Down, Depressed, Hopeless 0   PHQ - 2 Score 0   Altered sleeping 3   Tired, decreased energy 3   Change in appetite 3   Feeling bad or failure about yourself  0   Trouble concentrating 0   Moving slowly or fidgety/restless 0   Suicidal thoughts 0   PHQ-9 Score 9   Difficult doing work/chores Somewhat difficult      Psychosocial Evaluation and Intervention:   Psychosocial Re-Evaluation:  Education: Education Goals: Education classes will be provided on a weekly basis, covering required topics. Participant will state understanding/return demonstration of topics presented.  Learning Barriers/Preferences:     Learning Barriers/Preferences - 04/25/16 1445    Learning Barriers/Preferences   Learning Barriers None   Learning Preferences None      Education Topics: Initial Evaluation Education: - Verbal, written and demonstration of respiratory meds, RPE/PD scales, oximetry and breathing techniques. Instruction on use of nebulizers and MDIs: cleaning and proper use, rinsing mouth with steroid doses and importance of monitoring MDI activations.   General Nutrition Guidelines/Fats and Fiber: -Group instruction provided by verbal, written material, models and posters to present the general guidelines for heart healthy nutrition. Gives an explanation and review of dietary fats and fiber.   Controlling Sodium/Reading Food Labels: -Group verbal and written material supporting the discussion of sodium use in heart healthy nutrition. Review and explanation with models, verbal and written materials for utilization of the food label.   Exercise Physiology & Risk Factors: - Group verbal and written instruction with models to  review the exercise physiology of the cardiovascular system and associated critical values. Details cardiovascular disease risk factors and the goals associated with each risk factor.   Aerobic Exercise & Resistance Training: - Gives group verbal and written discussion on the health impact of inactivity. On the components of aerobic and resistive training programs and the benefits  of this training and how to safely progress through these programs.   Flexibility, Balance, General Exercise Guidelines: - Provides group verbal and written instruction on the benefits of flexibility and balance training programs. Provides general exercise guidelines with specific guidelines to those with heart or lung disease. Demonstration and skill practice provided.   Stress Management: - Provides group verbal and written instruction about the health risks of elevated stress, cause of high stress, and healthy ways to reduce stress.   Depression: - Provides group verbal and written instruction on the correlation between heart/lung disease and depressed mood, treatment options, and the stigmas associated with seeking treatment.   Exercise & Equipment Safety: - Individual verbal instruction and demonstration of equipment use and safety with use of the equipment.          Pulmonary Rehab from 04/25/2016 in [facility] Cardiac and Pulmonary Rehab   Date  04/25/16   Educator  SB   Instruction Review Code  2- meets goals/outcomes      Infection Prevention: - Provides verbal and written material to individual with discussion of infection control including proper hand washing and proper equipment cleaning during exercise session.      Pulmonary Rehab from 04/25/2016 in [facility] Cardiac and Pulmonary Rehab   Date  04/25/16   Educator  Sb   Instruction Review Code  2- meets goals/outcomes      Falls Prevention: - Provides verbal and written material to individual with discussion of falls prevention and safety.       Pulmonary Rehab from 04/25/2016 in [facility] Cardiac and Pulmonary Rehab   Date  04/25/16   Educator  Sb   Instruction Review Code  2- meets goals/outcomes      Diabetes: - Individual verbal and written instruction to review signs/symptoms of diabetes, desired ranges of glucose level fasting, after meals and with exercise. Advice that pre and post exercise glucose checks will be done for 3 sessions at entry of program.   Chronic Lung Diseases: - Group verbal and written instruction to review new updates, new respiratory medications, new advancements in procedures and treatments. Provide informative websites and "800" numbers of self-education.   Lung Procedures: - Group verbal and written instruction to describe testing methods done to diagnose lung disease. Review the outcome of test results. Describe the treatment choices: Pulmonary Function Tests, ABGs and oximetry.   Energy Conservation: - Provide group verbal and written instruction for methods to conserve energy, plan and organize activities. Instruct on pacing techniques, use of adaptive equipment and posture/positioning to relieve shortness of breath.   Triggers: - Group verbal and written instruction to review types of environmental controls: home humidity, furnaces, filters, dust mite/pet prevention, HEPA vacuums. To discuss weather changes, air quality and the benefits of nasal washing.   Exacerbations: - Group verbal and written instruction to provide: warning signs, infection symptoms, calling MD promptly, preventive modes, and value of vaccinations. Review: effective airway clearance, coughing and/or vibration techniques. Create an Sports administrator.   Oxygen: - Individual and group verbal and written instruction on oxygen therapy. Includes supplement oxygen, available portable oxygen systems, continuous and intermittent flow rates, oxygen safety, concentrators, and Medicare reimbursement for oxygen.   Respiratory Medications: -  Group verbal and written instruction to review medications for lung disease. Drug class, frequency, complications, importance of spacers, rinsing mouth after steroid MDI's, and proper cleaning methods for nebulizers.   AED/CPR: - Group verbal and written instruction with the use of models to demonstrate the basic use of  the AED with the basic ABC's of resuscitation.   Breathing Retraining: - Provides individuals verbal and written instruction on purpose, frequency, and proper technique of diaphragmatic breathing and pursed-lipped breathing. Applies individual practice skills.   Anatomy and Physiology of the Lungs: - Group verbal and written instruction with the use of models to provide basic lung anatomy and physiology related to function, structure and complications of lung disease.   Heart Failure: - Group verbal and written instruction on the basics of heart failure: signs/symptoms, treatments, explanation of ejection fraction, enlarged heart and cardiomyopathy.   Sleep Apnea: - Individual verbal and written instruction to review Obstructive Sleep Apnea. Review of risk factors, methods for diagnosing and types of masks and machines for OSA.   Anxiety: - Provides group, verbal and written instruction on the correlation between heart/lung disease and anxiety, treatment options, and management of anxiety.   Relaxation: - Provides group, verbal and written instruction about the benefits of relaxation for patients with heart/lung disease. Also provides patients with examples of relaxation techniques.   Knowledge Questionnaire Score:     Knowledge Questionnaire Score - 04/26/16 1013    Knowledge Questionnaire Score   Pre Score 3/10       Core Components/Risk Factors/Patient Goals at Admission:     Personal Goals and Risk Factors at Admission - 04/25/16 1456    Core Components/Risk Factors/Patient Goals on Admission    Weight Management Weight Gain;Yes   Intervention Weight  Management: Develop a combined nutrition and exercise program designed to reach desired caloric intake, while maintaining appropriate intake of nutrient and fiber, sodium and fats, and appropriate energy expenditure required for the weight goal.   Admit Weight 109 lb (49.442 kg)   Goal Weight: Short Term 115 lb (52.164 kg)   Goal Weight: Long Term 125 lb (56.7 kg)   Expected Outcomes Short Term: Continue to assess and modify interventions until short term weight is achieved;Long Term: Adherence to nutrition and physical activity/exercise program aimed toward attainment of established weight goal;Weight Gain: Understanding of general recommendations for a high calorie, high protein meal plan that promotes weight gain by distributing calorie intake throughout the day with the consumption for 4-5 meals, snacks, and/or supplements   Sedentary Yes   Intervention Provide advice, education, support and counseling about physical activity/exercise needs.;Develop an individualized exercise prescription for aerobic and resistive training based on initial evaluation findings, risk stratification, comorbidities and participant's personal goals.   Expected Outcomes Achievement of increased cardiorespiratory fitness and enhanced flexibility, muscular endurance and strength shown through measurements of functional capacity and personal statement of participant.   Increase Strength and Stamina Yes   Intervention Provide advice, education, support and counseling about physical activity/exercise needs.;Develop an individualized exercise prescription for aerobic and resistive training based on initial evaluation findings, risk stratification, comorbidities and participant's personal goals.   Expected Outcomes Achievement of increased cardiorespiratory fitness and enhanced flexibility, muscular endurance and strength shown through measurements of functional capacity and personal statement of participant.   Improve shortness  of breath with ADL's Yes   Intervention Provide education, individualized exercise plan and daily activity instruction to help decrease symptoms of SOB with activities of daily living.   Expected Outcomes Short Term: Achieves a reduction of symptoms when performing activities of daily living.   Develop more efficient breathing techniques such as purse lipped breathing and diaphragmatic breathing; and practicing self-pacing with activity Yes   Intervention Provide education, demonstration and support about specific breathing techniuqes utilized for more efficient breathing.  Include techniques such as pursed lipped breathing, diaphragmatic breathing and self-pacing activity.   Expected Outcomes Short Term: Participant will be able to demonstrate and use breathing techniques as needed throughout daily activities.   Increase knowledge of respiratory medications and ability to use respiratory devices properly  Yes   Intervention Provide education and demonstration as needed of appropriate use of medications, inhalers, and oxygen therapy.   Expected Outcomes Short Term: Achieves understanding of medications use. Understands that oxygen is a medication prescribed by physician. Demonstrates appropriate use of inhaler and oxygen therapy.      Core Components/Risk Factors/Patient Goals Review:      Goals and Risk Factor Review      05/02/16 1210           Core Components/Risk Factors/Patient Goals Review   Personal Goals Review Develop more efficient breathing techniques such as purse lipped breathing and diaphragmatic breathing and practicing self-pacing with activity.       Review Discussed and explained pursed lip breathing. Patient demonstrated understanding of breathing techniques.        Expected Outcomes Patient will independently use pursed lip breathing techniques during exercise to help tolerate SOB.           Core Components/Risk Factors/Patient Goals at Discharge (Final Review):       Goals and Risk Factor Review - 05/02/16 1210    Core Components/Risk Factors/Patient Goals Review   Personal Goals Review Develop more efficient breathing techniques such as purse lipped breathing and diaphragmatic breathing and practicing self-pacing with activity.   Review Discussed and explained pursed lip breathing. Patient demonstrated understanding of breathing techniques.    Expected Outcomes Patient will independently use pursed lip breathing techniques during exercise to help tolerate SOB.       ITP Comments:     ITP Comments      04/25/16 1439           ITP Comments Initial ITP completed today.            Comments: 30 day review

## 2016-05-02 NOTE — Progress Notes (Signed)
Daily Session Note  Patient Details  Name: Breanna Christian MRN: [id number] Date of Birth: [birth year] Referring Provider:    Encounter Date: 05/02/2016  Check In:     Session Check In - 05/02/16 1157    Check-In   Location [facility]-Cardiac & Pulmonary Rehab   Staff Present Earlean Shawl, BS, ACSM CEP, Exercise Physiologist;Laureen Owens Shark, BS, RRT, Respiratory Lennie Hummer, MA, ACSM RCEP, Exercise Physiologist   Supervising physician immediately available to respond to emergencies [facility] immediately available ER MD   Physician(s) Drs. Marcelene [place] and [place]   Medication changes reported     No   Fall or balance concerns reported    No   Warm-up and Cool-down Performed on first and last piece of equipment   Resistance Training Performed Yes   VAD Patient? No   Pain Assessment   Currently in Pain? No/denies   Multiple Pain Sites No         Goals Met:  Proper associated with RPD/PD & O2 Sat Exercise tolerated well Personal goals reviewed Strength training completed today  Goals Unmet:  Not Applicable  Comments: First full day of exercise!  Patient was oriented to gym and equipment including functions, settings, policies, and procedures.  Patient's individual exercise prescription and treatment plan were reviewed.  All starting workloads were established based on the results of the 6 minute walk test done at initial orientation visit.  The plan for exercise progression was also introduced and progression will be customized based on patient's performance and goals.   Dr. Emily Filbert is Medical Director for [place] and [facility] Pulmonary Rehabilitation.

## 2016-05-04 DIAGNOSIS — J449 Chronic obstructive pulmonary disease, unspecified: Secondary | ICD-10-CM | POA: Diagnosis not present

## 2016-05-04 NOTE — Progress Notes (Signed)
Daily Session Note  Patient Details  Name: Breanna Christian MRN: [id number] Date of Birth: Aug 21, 1941 Referring Provider:    Encounter Date: 05/04/2016  Check In:     Session Check In - 05/04/16 1125    Check-In   Location [facility]   Staff Present Heath Lark, RN, BSN, CCRP;Laureen Owens Shark, BS, RRT, Respiratory Dareen Piano, BA, ACSM CEP, Exercise Physiologist   Supervising physician immediately available to respond to emergencies [facility] immediately available ER MD   Physician(s) Archie Balboa and Reita Cliche   Medication changes reported     No   Fall or balance concerns reported    No   Warm-up and Cool-down Performed on first and last piece of equipment   Resistance Training Performed Yes   VAD Patient? No   Pain Assessment   Currently in Pain? No/denies   Multiple Pain Sites No         Goals Met:  Proper associated with RPD/PD & O2 Sat Independence with exercise equipment Exercise tolerated well Strength training completed today  Goals Unmet:  Not Applicable  Comments: [name].  Will continue to monitor for progression.    Dr. Emily Filbert is Medical Director for [place] and [facility].

## 2016-05-06 ENCOUNTER — Encounter: Payer: Medicare Other | Admitting: Respiratory Therapy

## 2016-05-06 DIAGNOSIS — J449 Chronic obstructive pulmonary disease, unspecified: Secondary | ICD-10-CM | POA: Diagnosis not present

## 2016-05-06 NOTE — Progress Notes (Signed)
Daily Session Note  Patient Details  Name: Breanna Christian MRN: [id number] Date of Birth: 04/17/1941 Referring Provider:    Encounter Date: 05/06/2016  Check In:     Session Check In - 05/06/16 1225    Check-In   Location [facility]   Staff Present Heath Lark, RN, BSN, CCRP;Carroll Enterkin, RN, BSN;Eloina Ergle Blanch Media, RRT, RCP, Respiratory Therapist   Supervising physician immediately available to respond to emergencies [facility] immediately available ER MD   Physician(s) Dr. Marcelene Butte and Jacqualine Code   Medication changes reported     No   Fall or balance concerns reported    No   Warm-up and Cool-down Performed on first and last piece of equipment   Resistance Training Performed Yes   VAD Patient? No   Pain Assessment   Currently in Pain? No/denies           Exercise Prescription Changes - 05/06/16 1200    Response to Exercise   Duration Progress to 30 minutes of continuous aerobic without signs/symptoms of physical distress   Intensity THRR unchanged  135-149   Progression   Progression Continue progressive overload as per policy without signs/symptoms or physical distress.   Resistance Training   Training Prescription Yes   Weight 2   Reps 10-15   Treadmill   MPH 2   Grade 0   Minutes 10   NuStep   Level 3  Omit today due to time. Was oriented to machine.    Watts 30   Minutes 15   Biostep-RELP   Level 2   Watts 25   Minutes 12      Goals Met:  Proper associated with RPD/PD & O2 Sat Independence with exercise equipment Using PLB without cueing & demonstrates good technique Exercise tolerated well Strength training completed today  Goals Unmet:  Not Applicable  Comments: [name].  Will continue to monitor for progression.    Dr. Emily Filbert is Medical Director for [place] and [facility].

## 2016-05-09 ENCOUNTER — Encounter: Payer: Medicare Other | Admitting: *Deleted

## 2016-05-09 DIAGNOSIS — J449 Chronic obstructive pulmonary disease, unspecified: Secondary | ICD-10-CM

## 2016-05-09 NOTE — Progress Notes (Signed)
Daily Session Note  Patient Details  Name: SHERIECE JEFCOAT MRN: [id number] Date of Birth: 13-Mar-1941 Referring Provider:    Encounter Date: 05/09/2016  Check In:     Session Check In - 05/09/16 1205    Check-In   Location [facility]   Staff Present Carson Myrtle, BS, RRT, Respiratory Therapist;Andreas Sobolewski Amedeo Plenty, BS, ACSM CEP, Exercise Physiologist;Jessica Luan [place], MA, ACSM RCEP, Exercise Physiologist   Supervising physician immediately available to respond to emergencies [facility] immediately available ER MD   Physician(s) Clearnce Hasten and Joni Fears   Medication changes reported     No   Fall or balance concerns reported    No   Warm-up and Cool-down Performed on first and last piece of equipment   Resistance Training Performed Yes   VAD Patient? No   Pain Assessment   Currently in Pain? No/denies   Multiple Pain Sites No         Goals Met:  Proper associated with RPD/PD & O2 Sat Independence with exercise equipment Exercise tolerated well Strength training completed today  Goals Unmet:  Not Applicable  Comments: Patient completed exercise prescription and all exercise goals during rehab session. The exercise was tolerated well and the patient is progressing in the program.     Dr. Emily Filbert is Medical Director for [place] and [facility].

## 2016-05-11 DIAGNOSIS — J449 Chronic obstructive pulmonary disease, unspecified: Secondary | ICD-10-CM

## 2016-05-11 NOTE — Progress Notes (Signed)
Daily Session Note  Patient Details  Name: Breanna Christian MRN: [id number] Date of Birth: [birth year] Referring Provider:    Encounter Date: 05/11/2016  Check In:     Session Check In - 05/11/16 1207    Check-In   Location [facility]   Staff Present Heath Lark, RN, BSN, CCRP;Laureen Owens Shark, BS, RRT, Respiratory Dareen Piano, BA, ACSM CEP, Exercise Physiologist   Supervising physician immediately available to respond to emergencies [facility] immediately available ER MD   Physician(s) Lovena Le and Paduchowitz   Medication changes reported     No   Fall or balance concerns reported    No   Warm-up and Cool-down Performed on first and last piece of equipment   Resistance Training Performed Yes   VAD Patient? No   Pain Assessment   Currently in Pain? No/denies   Multiple Pain Sites No         Goals Met:  Proper associated with RPD/PD & O2 Sat Independence with exercise equipment Exercise tolerated well Strength training completed today  Goals Unmet:  Not Applicable  Comments: [name].  Will continue to monitor for progression..   Dr. Emily Filbert is Medical Director for [place] and [facility].

## 2016-05-13 DIAGNOSIS — J449 Chronic obstructive pulmonary disease, unspecified: Secondary | ICD-10-CM

## 2016-05-13 LAB — AFB ORGANISM ID BY DNA PROBE
M avium complex: NEGATIVE
M tuberculosis complex: NEGATIVE

## 2016-05-13 LAB — ACID FAST CULTURE WITH REFLEXED SENSITIVITIES (MYCOBACTERIA): Acid Fast Culture: POSITIVE — AB

## 2016-05-13 LAB — RAPID GROWER BROTH SUSCEP.
Amikacin: 8
Cefoxitin: 128
Clarithromycin: 0.5
Doxycycline: >16
Linezolid: 8
Minocycline: >8
Tigecycline: 0.25

## 2016-05-13 LAB — ACID FAST SMEAR (AFB, MYCOBACTERIA): Acid Fast Smear: NEGATIVE

## 2016-05-13 LAB — ORG ID BY SEQUENCING RFLX AST

## 2016-05-13 NOTE — Progress Notes (Signed)
Daily Session Note  Patient Details  Name: LAYAH SKOUSEN MRN: [id number] Date of Birth: 1941-09-27 Referring Provider:    Encounter Date: 05/13/2016  Check In:     Session Check In - 05/13/16 1049    Check-In   Location [facility]   Staff Present Alberteen Sam, MA, ACSM RCEP, Exercise Physiologist;Amanda Oletta Darter, [place], ACSM CEP, Exercise Physiologist;Stacey Blanch Media, RRT, RCP, Respiratory Therapist   Supervising physician immediately available to respond to emergencies [facility] immediately available ER MD   Physician(s) Jimmye Norman and Lord   Medication changes reported     No   Fall or balance concerns reported    No   Warm-up and Cool-down Performed on first and last piece of equipment   Resistance Training Performed Yes   VAD Patient? No   Pain Assessment   Currently in Pain? No/denies         Goals Met:  Proper associated with RPD/PD & O2 Sat Independence with exercise equipment Exercise tolerated well Strength training completed today  Goals Unmet:  Not Applicable  Comments: [name].  Will continue to monitor for progression.    Dr. Emily Filbert is Medical Director for [place] and [facility].

## 2016-05-16 ENCOUNTER — Encounter: Payer: Medicare Other | Admitting: *Deleted

## 2016-05-16 DIAGNOSIS — J449 Chronic obstructive pulmonary disease, unspecified: Secondary | ICD-10-CM

## 2016-05-16 NOTE — Progress Notes (Signed)
Daily Session Note  Patient Details  Name: NOOR WITTE MRN: [id number] Date of Birth: [birth year] Referring Provider:    Encounter Date: 05/16/2016  Check In:     Session Check In - 05/16/16 1141    Check-In   Location [facility]   Staff Present Carson Myrtle, BS, RRT, Respiratory Bertis Ruddy, BS, ACSM CEP, Exercise Physiologist;Mary Kellie Shropshire, RN, BSN, MA   Supervising physician immediately available to respond to emergencies [facility] immediately available ER MD   Physician(s) Marcelene Butte and Corky Downs   Medication changes reported     No   Fall or balance concerns reported    No   Warm-up and Cool-down Performed on first and last piece of equipment   Resistance Training Performed Yes   VAD Patient? No   Pain Assessment   Currently in Pain? No/denies   Multiple Pain Sites No         Goals Met:  Proper associated with RPD/PD & O2 Sat Independence with exercise equipment Exercise tolerated well Strength training completed today  Goals Unmet:  Not Applicable  Comments: Patient completed exercise prescription and all exercise goals during rehab session. The exercise was tolerated well and the patient is progressing in the program.     Dr. Emily Filbert is Medical Director for [place] and [facility].

## 2016-05-18 DIAGNOSIS — J449 Chronic obstructive pulmonary disease, unspecified: Secondary | ICD-10-CM | POA: Diagnosis not present

## 2016-05-18 NOTE — Progress Notes (Signed)
Daily Session Note  Patient Details  Name: Breanna Christian MRN: [id number] Date of Birth: 1941-10-30 Referring Provider:    Encounter Date: 05/18/2016  Check In:     Session Check In - 05/18/16 1247    Check-In   Location [facility]   Staff Present Nyoka Cowden, RN, BSN, Walden Field, BS, RRT, Respiratory Dareen Piano, BA, ACSM CEP, Exercise Physiologist   Supervising physician immediately available to respond to emergencies [facility] immediately available ER MD   Physician(s) Jimmye Norman and Edd Fabian   Medication changes reported     No   Fall or balance concerns reported    No   Warm-up and Cool-down Performed on first and last piece of equipment   Resistance Training Performed Yes   VAD Patient? No   Pain Assessment   Currently in Pain? No/denies   Multiple Pain Sites No         Goals Met:  Proper associated with RPD/PD & O2 Sat Independence with exercise equipment Exercise tolerated well Strength training completed today  Goals Unmet:  Not Applicable  Comments: [name].  Will continue to monitor for progression.   Dr. Emily Filbert is Medical Director for [place] and [facility].

## 2016-05-20 ENCOUNTER — Encounter: Payer: Medicare Other | Admitting: *Deleted

## 2016-05-20 DIAGNOSIS — J449 Chronic obstructive pulmonary disease, unspecified: Secondary | ICD-10-CM | POA: Diagnosis not present

## 2016-05-20 NOTE — Progress Notes (Signed)
Daily Session Note  Patient Details  Name: LEVIE OWENSBY MRN: [id number] Date of Birth: Nov 15, 1941 Referring Provider:    Encounter Date: 05/20/2016  Check In:     Session Check In - 05/20/16 1103    Check-In   Location [facility]   Staff Present Heath Lark, RN, BSN, CCRP;Sharnika Binney, RN, Alex Gardener, DPT, CEEA   Supervising physician immediately available to respond to emergencies [facility] immediately available ER MD   Physician(s) Dr. Jimmye Norman Dr. Corky Downs   Medication changes reported     No   Fall or balance concerns reported    No   Warm-up and Cool-down Performed on first and last piece of equipment   Resistance Training Performed Yes   VAD Patient? No   Pain Assessment   Currently in Pain? No/denies         Goals Met:  Proper associated with RPD/PD & O2 Sat Exercise tolerated well  Goals Unmet:  Not Applicable  Comments:    Dr. Emily Filbert is Medical Director for [place] and [facility].

## 2016-05-23 ENCOUNTER — Encounter: Payer: Medicare Other | Attending: Internal Medicine

## 2016-05-23 DIAGNOSIS — J449 Chronic obstructive pulmonary disease, unspecified: Secondary | ICD-10-CM | POA: Insufficient documentation

## 2016-05-25 ENCOUNTER — Encounter: Payer: Medicare Other | Admitting: *Deleted

## 2016-05-25 DIAGNOSIS — J449 Chronic obstructive pulmonary disease, unspecified: Secondary | ICD-10-CM | POA: Diagnosis not present

## 2016-05-25 NOTE — Progress Notes (Signed)
Daily Session Note  Patient Details  Name: Jayden T Gockel MRN: [id number] Date of Birth: 03/29/1941 Referring Provider:    Encounter Date: 05/25/2016  Check In:     Session Check In - 05/25/16 1120    Check-In   Location [facility]   Staff Present Jessica Hawkins, MA, ACSM RCEP, Exercise Physiologist;Amanda Sommer, BA, ACSM CEP, Exercise Physiologist;Laureen Brown, BS, RRT, Respiratory Therapist   Supervising physician immediately available to respond to emergencies [facility] immediately available ER MD   Physician(s) Veronese and [place]   Medication changes reported     No   Fall or balance concerns reported    No   Warm-up and Cool-down Performed on first and last piece of equipment   Resistance Training Performed Yes   VAD Patient? No   Pain Assessment   Currently in Pain? No/denies   Multiple Pain Sites No         Goals Met:  Proper associated with RPD/PD & O2 Sat Independence with exercise equipment Using PLB without cueing & demonstrates good technique Exercise tolerated well Strength training completed today  Goals Unmet:  Not Applicable  Comments: [name].  Will continue to monitor for progression.    Dr. Mark Miller is Medical Director for [facility] and [facility]. 

## 2016-05-27 ENCOUNTER — Encounter: Payer: Medicare Other | Admitting: *Deleted

## 2016-05-27 DIAGNOSIS — J449 Chronic obstructive pulmonary disease, unspecified: Secondary | ICD-10-CM

## 2016-05-27 NOTE — Progress Notes (Signed)
Daily Session Note  Patient Details  Name: Breanna Christian MRN: [id number] Date of Birth: [birth year] Referring Provider:    Encounter Date: 05/27/2016  Check In:     Session Check In - 05/27/16 1041    Check-In   Location [facility]   Staff Present Nyoka Cowden, RN, BSN, Walden Field, BS, RRT, Respiratory Therapist;Maigen Mozingo, RN, BSN   Supervising physician immediately available to respond to emergencies [facility] immediately available ER MD   Physician(s) Dr. Joni Fears, and Dr. Edd Fabian   Medication changes reported     No   Fall or balance concerns reported    No   Warm-up and Cool-down Performed on first and last piece of equipment   Resistance Training Performed Yes   VAD Patient? No   Pain Assessment   Currently in Pain? No/denies         Goals Met:  Proper associated with RPD/PD & O2 Sat Exercise tolerated well  Goals Unmet:  Not Applicable  Comments:     Dr. Emily Filbert is Medical Director for [place] and [facility].

## 2016-05-30 ENCOUNTER — Encounter: Payer: Medicare Other | Admitting: *Deleted

## 2016-05-30 ENCOUNTER — Encounter: Payer: Self-pay | Admitting: *Deleted

## 2016-05-30 DIAGNOSIS — J449 Chronic obstructive pulmonary disease, unspecified: Secondary | ICD-10-CM

## 2016-05-30 NOTE — Progress Notes (Signed)
Pulmonary Individual Treatment Plan  Patient Details  Name: Breanna Christian MRN: [id number] Date of Birth: 08-Jun-1941 Referring Provider:  Dr. Clayborn Bigness, MD  Initial Encounter Date:       Pulmonary Rehab from 04/25/2016 in [facility] Cardiac and Pulmonary Rehab   Date  04/25/16      Visit Diagnosis: COPD, moderate ([place])  Patient's Home Medications on Admission:  Current outpatient prescriptions:  .  aspirin EC 81 MG tablet, Take by mouth., Disp: , Rfl:  .  benzonatate (TESSALON) 100 MG capsule, Take by mouth 3 (three) times daily as needed for cough., Disp: , Rfl:  .  Calcium Carbonate-Vitamin D (CALCIUM-VITAMIN D) 500-200 MG-UNIT tablet, Take by mouth., Disp: , Rfl:  .  Cyanocobalamin (RA VITAMIN B-12 TR) 1000 MCG TBCR, Take by mouth., Disp: , Rfl:  .  diltiazem (TIAZAC) 240 MG 24 hr capsule, , Disp: , Rfl:  .  Fluticasone-Salmeterol (ADVAIR) 250-50 MCG/DOSE AEPB, Inhale 1 puff into the lungs 2 (two) times daily., Disp: , Rfl:  .  folic acid (FOLVITE) 1 MG tablet, Take by mouth., Disp: , Rfl:  .  ipratropium-albuterol (DUONEB) 0.5-2.5 (3) MG/3ML SOLN, Take 3 mLs by nebulization., Disp: , Rfl:  .  Multiple Vitamin (MULTI-VITAMINS) TABS, Take by mouth. Reported on 04/25/2016, Disp: , Rfl:  .  predniSONE (DELTASONE) 10 MG tablet, Take 10 mg by mouth daily with breakfast. Reported on 04/25/2016, Disp: , Rfl:  .  roflumilast (DALIRESP) 500 MCG TABS tablet, Take 500 mcg by mouth daily., Disp: , Rfl:  .  tiotropium (SPIRIVA) 18 MCG inhalation capsule, Place 18 mcg into inhaler and inhale daily., Disp: , Rfl:  .  triamterene-hydrochlorothiazide (DYAZIDE) 37.5-25 MG capsule, Take by mouth., Disp: , Rfl:  .  Vitamin D, Ergocalciferol, (DRISDOL) 50000 units CAPS capsule, Take by mouth., Disp: , Rfl:   Past Medical History: Past Medical History  Diagnosis Date  . Non-Hodgkin lymphoma ([place]) 2003  . Rheumatoid arthritis ([place])   . Xerostomia     Tobacco Use: History  Smoking status  . Never  Smoker   Smokeless tobacco  . Not on file    Labs: Recent Review Flowsheet Data    There is no flowsheet data to display.       ADL UCSD:     Pulmonary Assessment Scores      04/26/16 1021       ADL UCSD   ADL Phase Entry     SOB Score total 48     Rest 1     Walk 2     Stairs 5     Bath 2     Dress 1     Shop 3        Pulmonary Function Assessment:     Pulmonary Function Assessment - 02/10/16 1017    Pulmonary Function Tests   RV% 154 %   DLCO% 47 %   Initial Spirometry Results   FVC% 55 %   FEV1% 53 %   FEV1/FVC Ratio 77   Post Bronchodilator Spirometry Results   FVC% 68 %   FEV1% 56 %   FEV1/FVC Ratio 66   Breath   Shortness of Breath Yes;Limiting activity      Exercise Target Goals:    Exercise Program Goal: Individual exercise prescription set with THRR, safety & activity barriers. Participant demonstrates ability to understand and report RPE using BORG scale, to self-measure pulse accurately, and to acknowledge the importance of the exercise prescription.  Exercise Prescription Goal:  Starting with aerobic activity 30 plus minutes a day, 3 days per week for initial exercise prescription. Provide home exercise prescription and guidelines that participant acknowledges understanding prior to discharge.  Activity Barriers & Risk Stratification:     Activity Barriers & Cardiac Risk Stratification - 04/25/16 1445    Activity Barriers & Cardiac Risk Stratification   Activity Barriers Deconditioning;Muscular Weakness;Shortness of Breath      6 Minute Walk:     6 Minute Walk      04/25/16 1542       6 Minute Walk   Phase Initial     Distance 1160 feet     Distance % Change 0 %     Walk Time 6 minutes     # of Rest Breaks 0     MPH 2.2     METS 2.68     RPE 12     Perceived Dyspnea  2     Symptoms No     Resting HR 114 bpm     Resting BP 104/62 mmHg     Max Ex. HR 140 bpm     Max Ex. BP 130/62 mmHg        Initial Exercise  Prescription:     Initial Exercise Prescription - 04/25/16 1500    Date of Initial Exercise RX and Referring Provider   Date 04/25/16   Treadmill   MPH 2   Grade 0   Minutes 10   NuStep   Level 3   Watts 30   Minutes 15   Biostep-RELP   Level 2   Watts 25   Minutes 10   Prescription Details   Frequency (times per week) 3   Duration Progress to 30 minutes of continuous aerobic without signs/symptoms of physical distress   Intensity   THRR 40-80% of Max Heartrate 135-149   Ratings of Perceived Exertion 11-15   Perceived Dyspnea 0-4   Progression   Progression Continue progressive overload as per policy without signs/symptoms or physical distress.   Resistance Training   Training Prescription Yes   Weight 2   Reps 10-15      Perform Capillary Blood Glucose checks as needed.  Exercise Prescription Changes:     Exercise Prescription Changes      05/02/16 1200 05/06/16 1200 05/11/16 1400 05/25/16 1400     Exercise Review   Progression   Yes Yes    Response to Exercise   Blood Pressure (Admit) 124/70 mmHg  100/60 mmHg 126/70 mmHg    Blood Pressure (Exercise) 136/70 mmHg  146/68 mmHg 124/64 mmHg    Blood Pressure (Exit) 106/62 mmHg  108/60 mmHg 90/60 mmHg  rck after water 96/60    Heart Rate (Admit) 95 bpm  60 bpm 108 bpm    Heart Rate (Exercise) 119 bpm  126 bpm 122 bpm    Heart Rate (Exit) 94 bpm  92 bpm 101 bpm    Oxygen Saturation (Admit) 97 %  94 % 97 %    Oxygen Saturation (Exercise) 93 %  97 % 98 %    Oxygen Saturation (Exit) 97 %  97 % 97 %    Rating of Perceived Exertion (Exercise) _0 Perceived Dyspnea (Exercise) _1 Symptoms none  none none    Comments    Home Exercise Given 05/18/16    Duration Progress to 30 minutes of continuous aerobic without signs/symptoms of physical distress Progress to 30  minutes of continuous aerobic without signs/symptoms of physical distress Progress to 45 minutes of aerobic exercise without signs/symptoms of  physical distress Progress to 45 minutes of aerobic exercise without signs/symptoms of physical distress    Intensity THRR unchanged  135-149 THRR unchanged  135-149 THRR unchanged THRR unchanged    Progression   Progression Continue progressive overload as per policy without signs/symptoms or physical distress. Continue progressive overload as per policy without signs/symptoms or physical distress. Continue to progress workloads to maintain intensity without signs/symptoms of physical distress. Continue to progress workloads to maintain intensity without signs/symptoms of physical distress.    Average METs   2.5 2.34    Resistance Training   Training Prescription Yes Yes Yes Yes    Weight _0 Reps 10-15 10-15 10-15 10-15    Interval Training   Interval Training   No No    Treadmill   MPH _1 Grade 0 0 0 0    Minutes _2 NuStep   Level 3  Omit today due to time. Was oriented to machine.  3  Omit today due to time. Was oriented to machine.  3 3    Watts _3 2.0 METs    Minutes _4 Biostep-RELP   Level _5 Watts _6 2.5 METs    Minutes _7 Home Exercise Plan   Plans to continue exercise at    Home  Walking at [place] 1 additional day to program exercise sessions.       Exercise Comments:     Exercise Comments      05/02/16 1158 05/11/16 1435 05/18/16 1250 05/25/16 1426     Exercise Comments First full day of exercise!  Patient was oriented to gym and equipment including functions, settings, policies, and procedures.  Patient's individual exercise prescription and treatment plan were reviewed.  All starting workloads were established based on the results of the 6 minute walk test done at initial orientation visit.  The plan for exercise progression was also introduced and progression will be customized based on patient's performance and goals. Coralyn Mark is off to a good start with her  exercise.  She has already increased her time to 15 min on both BioStep and NuStep.  She is increasing her time on the treadmill.  We will continue to work with Coralyn Mark on her progression. Coralyn Mark was sedentary before beginning due to caring for her hisband who passed way - she walks once per week at [place] outside sessions at [facility].  She wants to get back to gardening.  Her son is helping her decide where to exrecise when she graduates LW> Coralyn Mark continues to do well with exercise.  She is making good progress on exercise and is now up to 12 minutes on the treadmill.  We will continue to work with her on progression.       Discharge Exercise Prescription (Final Exercise Prescription Changes):     Exercise Prescription Changes - 05/25/16 1400    Exercise Review   Progression Yes   Response to Exercise   Blood Pressure (Admit) 126/70 mmHg   Blood Pressure (Exercise) 124/64 mmHg   Blood Pressure (Exit) 90/60 mmHg  rck after water 96/60   Heart Rate (Admit) 108  bpm   Heart Rate (Exercise) 122 bpm   Heart Rate (Exit) 101 bpm   Oxygen Saturation (Admit) 97 %   Oxygen Saturation (Exercise) 98 %   Oxygen Saturation (Exit) 97 %   Rating of Perceived Exertion (Exercise) 13   Perceived Dyspnea (Exercise) 3   Symptoms none   Comments Home Exercise Given 05/18/16   Duration Progress to 45 minutes of aerobic exercise without signs/symptoms of physical distress   Intensity THRR unchanged   Progression   Progression Continue to progress workloads to maintain intensity without signs/symptoms of physical distress.   Average METs 2.34   Resistance Training   Training Prescription Yes   Weight 2   Reps 10-15   Interval Training   Interval Training No   Treadmill   MPH 2   Grade 0   Minutes 12   NuStep   Level 3   Watts 25  2.0 METs   Minutes 15   Biostep-RELP   Level 2   Watts 24  2.5 METs   Minutes 15   Home Exercise Plan   Plans to continue exercise at Home  Walking at [place] 1 additional day to program exercise sessions.       Nutrition:  Target Goals: Understanding of nutrition guidelines, daily intake of sodium <1552m, cholesterol <2029m calories 30% from fat and 7% or less from saturated fats, daily to have 5 or more servings of fruits and vegetables.  Biometrics:     Pre Biometrics - 04/25/16 1554    Pre Biometrics   Height 5' 2.5" (1.588 m)   Weight 108 lb 11.2 oz (49.306 kg)   Waist Circumference 28.3 inches   Hip Circumference 34 inches   Waist to Hip Ratio 0.83 %   BMI (Calculated) 19.6       Nutrition Therapy Plan and Nutrition Goals:     Nutrition Therapy & Goals - 04/25/16 1440    Intervention Plan   Intervention Prescribe, educate and counsel regarding individualized specific dietary modifications aiming towards targeted core components such as weight, hypertension, lipid management, diabetes, heart failure and other comorbidities.   Expected Outcomes Short Term Goal: Understand basic principles of dietary content, such as calories, fat, sodium, cholesterol and nutrients.;Short Term Goal: A plan has been developed with personal nutrition goals set during dietitian appointment.;Long Term Goal: Adherence to prescribed nutrition plan.      Nutrition Discharge: Rate Your Plate Scores:   Psychosocial: Target Goals: Acknowledge presence or absence of depression, maximize coping skills, provide positive support system. Participant is able to verbalize types and ability to use techniques and skills needed for reducing stress and depression.  Initial Review & Psychosocial Screening:     Initial Psych Review & Screening - 04/25/16 1441    Initial Review   Current issues with Current Sleep Concerns   Family Dynamics   Good Support System? Yes  Children nearby   Concerns Recent loss of significant other   Comments Spouse died this past Ap04-24-2024Married since 1974         Barriers   Psychosocial barriers to participate in program  There are no identifiable barriers or psychosocial needs.;The patient should benefit from training in stress management and relaxation.   Screening Interventions   Interventions Encouraged to exercise      Quality of Life Scores:     Quality of Life - 04/26/16 1027    Quality of Life Scores   Health/Function Pre 11.53 %  Socioeconomic Pre 17.1 %   Psych/Spiritual Pre 23.71 %   Family Pre 25.5 %   GLOBAL Pre 16.98 %      PHQ-9:     Recent Review Flowsheet Data    Depression screen [facility] 2/9 04/25/2016   Decreased Interest 0   Down, Depressed, Hopeless 0   PHQ - 2 Score 0   Altered sleeping 3   Tired, decreased energy 3   Change in appetite 3   Feeling bad or failure about yourself  0   Trouble concentrating 0   Moving slowly or fidgety/restless 0   Suicidal thoughts 0   PHQ-9 Score 9   Difficult doing work/chores Somewhat difficult      Psychosocial Evaluation and Intervention:     Psychosocial Evaluation - 05/04/16 1037    Psychosocial Evaluation & Interventions   Interventions Encouraged to exercise with the program and follow exercise prescription   Comments Counselor met with Ms. Resh (Terri) today for initial psychosocial evaluation.  She is a 75 year old who is not quite sure what her "lung problem" is.  She has a very strong [place] accent and was difficult to understand at times.  Terri lives alone since her spouse of 42 years passed away several months ago.  Her (3) children live closeby and check in on her often.  Karna Christmas is also involved in the local church.  She states she does not sleep well nor does she have a good appetite and reports losing almost 50 pounds over the past two years while taking care of her spouse's health care needs.  She denies a history of depression or anxiety and admits to some sadness and general loss of interest in doing much since her spouse passed.  Terri reports "not much" stress in her life and has goals to just "feel better" while in  this program.  Counselor will follow with Terri while here to determine if consistent exercise makes a difference in her sleep patterns and appetite.  If not, counselor will recommend speaking with her doctor about a medication evaluation or speaking with a therapist to address her grief/loss impacting her mood.       Continued Psychosocial Services Needed Yes  Karna Christmas will benefit from the psychoeducational components of this program - particularly depression.  She may need to see a Dr. about a medication evaluation for her mood, or a therapist.      Psychosocial Re-Evaluation:  Education: Education Goals: Education classes will be provided on a weekly basis, covering required topics. Participant will state understanding/return demonstration of topics presented.  Learning Barriers/Preferences:     Learning Barriers/Preferences - 04/25/16 1445    Learning Barriers/Preferences   Learning Barriers None   Learning Preferences None      Education Topics: Initial Evaluation Education: - Verbal, written and demonstration of respiratory meds, RPE/PD scales, oximetry and breathing techniques. Instruction on use of nebulizers and MDIs: cleaning and proper use, rinsing mouth with steroid doses and importance of monitoring MDI activations.   General Nutrition Guidelines/Fats and Fiber: -Group instruction provided by verbal, written material, models and posters to present the general guidelines for heart healthy nutrition. Gives an explanation and review of dietary fats and fiber.   Controlling Sodium/Reading Food Labels: -Group verbal and written material supporting the discussion of sodium use in heart healthy nutrition. Review and explanation with models, verbal and written materials for utilization of the food label.          Pulmonary Rehab from 05/16/2016  in [facility] Cardiac and Pulmonary Rehab   Date  05/09/16   Educator  CR   Instruction Review Code  2- meets goals/outcomes       Exercise Physiology & Risk Factors: - Group verbal and written instruction with models to review the exercise physiology of the cardiovascular system and associated critical values. Details cardiovascular disease risk factors and the goals associated with each risk factor.   Aerobic Exercise & Resistance Training: - Gives group verbal and written discussion on the health impact of inactivity. On the components of aerobic and resistive training programs and the benefits of this training and how to safely progress through these programs.   Flexibility, Balance, General Exercise Guidelines: - Provides group verbal and written instruction on the benefits of flexibility and balance training programs. Provides general exercise guidelines with specific guidelines to those with heart or lung disease. Demonstration and skill practice provided.   Stress Management: - Provides group verbal and written instruction about the health risks of elevated stress, cause of high stress, and healthy ways to reduce stress.   Depression: - Provides group verbal and written instruction on the correlation between heart/lung disease and depressed mood, treatment options, and the stigmas associated with seeking treatment.      Pulmonary Rehab from 05/16/2016 in [facility] Cardiac and Pulmonary Rehab   Date  05/04/16   Educator  [facility]   Instruction Review Code  2- meets goals/outcomes      Exercise & Equipment Safety: - Individual verbal instruction and demonstration of equipment use and safety with use of the equipment.      Pulmonary Rehab from 05/16/2016 in [facility] Cardiac and Pulmonary Rehab   Date  04/25/16   Educator  SB   Instruction Review Code  2- meets goals/outcomes      Infection Prevention: - Provides verbal and written material to individual with discussion of infection control including proper hand washing and proper equipment cleaning during exercise session.      Pulmonary Rehab from 05/16/2016 in  [facility] Cardiac and Pulmonary Rehab   Date  04/25/16   Educator  Sb   Instruction Review Code  2- meets goals/outcomes      Falls Prevention: - Provides verbal and written material to individual with discussion of falls prevention and safety.      Pulmonary Rehab from 05/16/2016 in [facility] Cardiac and Pulmonary Rehab   Date  04/25/16   Educator  Sb   Instruction Review Code  2- meets goals/outcomes      Diabetes: - Individual verbal and written instruction to review signs/symptoms of diabetes, desired ranges of glucose level fasting, after meals and with exercise. Advice that pre and post exercise glucose checks will be done for 3 sessions at entry of program.   Chronic Lung Diseases: - Group verbal and written instruction to review new updates, new respiratory medications, new advancements in procedures and treatments. Provide informative websites and "800" numbers of self-education.   Lung Procedures: - Group verbal and written instruction to describe testing methods done to diagnose lung disease. Review the outcome of test results. Describe the treatment choices: Pulmonary Function Tests, ABGs and oximetry.   Energy Conservation: - Provide group verbal and written instruction for methods to conserve energy, plan and organize activities. Instruct on pacing techniques, use of adaptive equipment and posture/positioning to relieve shortness of breath.   Triggers: - Group verbal and written instruction to review types of environmental controls: home humidity, furnaces, filters, dust mite/pet prevention, HEPA vacuums. To discuss  weather changes, air quality and the benefits of nasal washing.   Exacerbations: - Group verbal and written instruction to provide: warning signs, infection symptoms, calling MD promptly, preventive modes, and value of vaccinations. Review: effective airway clearance, coughing and/or vibration techniques. Create an Sports administrator.      Pulmonary Rehab from 05/16/2016  in [facility] Cardiac and Pulmonary Rehab   Date  05/16/16   Educator  LB   Instruction Review Code  2- meets goals/outcomes      Oxygen: - Individual and group verbal and written instruction on oxygen therapy. Includes supplement oxygen, available portable oxygen systems, continuous and intermittent flow rates, oxygen safety, concentrators, and Medicare reimbursement for oxygen.   Respiratory Medications: - Group verbal and written instruction to review medications for lung disease. Drug class, frequency, complications, importance of spacers, rinsing mouth after steroid MDI's, and proper cleaning methods for nebulizers.   AED/CPR: - Group verbal and written instruction with the use of models to demonstrate the basic use of the AED with the basic ABC's of resuscitation.   Breathing Retraining: - Provides individuals verbal and written instruction on purpose, frequency, and proper technique of diaphragmatic breathing and pursed-lipped breathing. Applies individual practice skills.   Anatomy and Physiology of the Lungs: - Group verbal and written instruction with the use of models to provide basic lung anatomy and physiology related to function, structure and complications of lung disease.      Pulmonary Rehab from 05/16/2016 in [facility] Cardiac and Pulmonary Rehab   Date  05/06/16   Educator  [place]   Instruction Review Code  2- meets goals/outcomes      Heart Failure: - Group verbal and written instruction on the basics of heart failure: signs/symptoms, treatments, explanation of ejection fraction, enlarged heart and cardiomyopathy.   Sleep Apnea: - Individual verbal and written instruction to review Obstructive Sleep Apnea. Review of risk factors, methods for diagnosing and types of masks and machines for OSA.   Anxiety: - Provides group, verbal and written instruction on the correlation between heart/lung disease and anxiety, treatment options, and management of  anxiety.   Relaxation: - Provides group, verbal and written instruction about the benefits of relaxation for patients with heart/lung disease. Also provides patients with examples of relaxation techniques.   Knowledge Questionnaire Score:     Knowledge Questionnaire Score - 04/26/16 1013    Knowledge Questionnaire Score   Pre Score 3/10       Core Components/Risk Factors/Patient Goals at Admission:     Personal Goals and Risk Factors at Admission - 04/25/16 1456    Core Components/Risk Factors/Patient Goals on Admission    Weight Management Weight Gain;Yes   Intervention Weight Management: Develop a combined nutrition and exercise program designed to reach desired caloric intake, while maintaining appropriate intake of nutrient and fiber, sodium and fats, and appropriate energy expenditure required for the weight goal.   Admit Weight 109 lb (49.442 kg)   Goal Weight: Short Term 115 lb (52.164 kg)   Goal Weight: Long Term 125 lb (56.7 kg)   Expected Outcomes Short Term: Continue to assess and modify interventions until short term weight is achieved;Long Term: Adherence to nutrition and physical activity/exercise program aimed toward attainment of established weight goal;Weight Gain: Understanding of general recommendations for a high calorie, high protein meal plan that promotes weight gain by distributing calorie intake throughout the day with the consumption for 4-5 meals, snacks, and/or supplements   Sedentary Yes   Intervention Provide advice, education, support and  counseling about physical activity/exercise needs.;Develop an individualized exercise prescription for aerobic and resistive training based on initial evaluation findings, risk stratification, comorbidities and participant's personal goals.   Expected Outcomes Achievement of increased cardiorespiratory fitness and enhanced flexibility, muscular endurance and strength shown through measurements of functional capacity and  personal statement of participant.   Increase Strength and Stamina Yes   Intervention Provide advice, education, support and counseling about physical activity/exercise needs.;Develop an individualized exercise prescription for aerobic and resistive training based on initial evaluation findings, risk stratification, comorbidities and participant's personal goals.   Expected Outcomes Achievement of increased cardiorespiratory fitness and enhanced flexibility, muscular endurance and strength shown through measurements of functional capacity and personal statement of participant.   Improve shortness of breath with ADL's Yes   Intervention Provide education, individualized exercise plan and daily activity instruction to help decrease symptoms of SOB with activities of daily living.   Expected Outcomes Short Term: Achieves a reduction of symptoms when performing activities of daily living.   Develop more efficient breathing techniques such as purse lipped breathing and diaphragmatic breathing; and practicing self-pacing with activity Yes   Intervention Provide education, demonstration and support about specific breathing techniuqes utilized for more efficient breathing. Include techniques such as pursed lipped breathing, diaphragmatic breathing and self-pacing activity.   Expected Outcomes Short Term: Participant will be able to demonstrate and use breathing techniques as needed throughout daily activities.   Increase knowledge of respiratory medications and ability to use respiratory devices properly  Yes   Intervention Provide education and demonstration as needed of appropriate use of medications, inhalers, and oxygen therapy.   Expected Outcomes Short Term: Achieves understanding of medications use. Understands that oxygen is a medication prescribed by physician. Demonstrates appropriate use of inhaler and oxygen therapy.      Core Components/Risk Factors/Patient Goals Review:      Goals and Risk  Factor Review      05/02/16 1210 05/16/16 1000 05/16/16 1552 05/18/16 1248     Core Components/Risk Factors/Patient Goals Review   Personal Goals Review Develop more efficient breathing techniques such as purse lipped breathing and diaphragmatic breathing and practicing self-pacing with activity. Develop more efficient breathing techniques such as purse lipped breathing and diaphragmatic breathing and practicing self-pacing with activity. Weight Management/Obesity Improve shortness of breath with ADL's;Sedentary    Review Discussed and explained pursed lip breathing. Patient demonstrated understanding of breathing techniques.  Ms Hilley uses PLB with her exercise goals and with activities at home. She has good technique and requires no queing. Ms Spratling felt very positive about her weight gain today. She is up 2lbs. Having meet with the dietitian, she is drinking a protein drink called Primar which has 60grams of protein and tastes good to her . Ms Pensabene reports being able to climbsteps better since beginning LW.    Expected Outcomes Patient will independently use pursed lip breathing techniques during exercise to help tolerate SOB.  Continue using PLB for management of her shortness of breath with activity. Continue working on gaining weight - her goals is a weight of 120lbs. Ms Gough will see continued improvement with ADLS as she continues to progress with exercise       Core Components/Risk Factors/Patient Goals at Discharge (Final Review):      Goals and Risk Factor Review - 05/18/16 1248    Core Components/Risk Factors/Patient Goals Review   Personal Goals Review Improve shortness of breath with ADL's;Sedentary   Review Ms Pechacek reports being able to climbsteps better  since beginning LW.   Expected Outcomes Ms Umholtz will see continued improvement with ADLS as she continues to progress with exercise      ITP Comments:     ITP Comments      04/25/16 1439           ITP Comments  Initial ITP completed today.            Comments: 30 Day Review

## 2016-05-30 NOTE — Progress Notes (Signed)
Daily Session Note  Patient Details  Name: DOREENA MAULDEN MRN: [id number] Date of Birth: [birth year] Referring Provider:    Encounter Date: 05/30/2016  Check In:     Session Check In - 05/30/16 1048    Check-In   Location [facility] & Pulmonary Rehab   Staff Present Carson Myrtle, BS, RRT, Respiratory Lennie Hummer, MA, ACSM RCEP, Exercise Physiologist;Gertrude Tarbet Amedeo Plenty, BS, ACSM CEP, Exercise Physiologist   Supervising physician immediately available to respond to emergencies [facility] immediately available ER MD   Physician(s) Darl Householder and Corky Downs   Medication changes reported     No   Fall or balance concerns reported    No   Warm-up and Cool-down Performed on first and last piece of equipment   Resistance Training Performed Yes   VAD Patient? No   Pain Assessment   Currently in Pain? No/denies   Multiple Pain Sites No         Goals Met:  Proper associated with RPD/PD & O2 Sat Independence with exercise equipment Exercise tolerated well Strength training completed today  Goals Unmet:  Not Applicable  Comments: Patient completed exercise prescription and all exercise goals during rehab session. The exercise was tolerated well and the patient is progressing in the program.     Dr. Emily Filbert is Medical Director for [place] and [facility].

## 2016-06-01 ENCOUNTER — Encounter: Payer: Medicare Other | Admitting: *Deleted

## 2016-06-01 DIAGNOSIS — J449 Chronic obstructive pulmonary disease, unspecified: Secondary | ICD-10-CM

## 2016-06-01 NOTE — Progress Notes (Signed)
Daily Session Note  Patient Details  Name: Breanna Christian MRN: [id number] Date of Birth: 09/15/1941 Referring Provider:        Pulmonary Rehab from 05/30/2016 in [facility] and [facility] Rehab   Referring Provider  Khan, Fozia MD      Encounter Date: 06/01/2016  Check In:     Session Check In - 06/01/16 1155    Check-In   Location [facility]   Staff Present Jessica Hawkins, MA, ACSM RCEP, Exercise Physiologist;Amanda Sommer, BA, ACSM CEP, Exercise Physiologist;Laureen Brown, BS, RRT, Respiratory Therapist   Supervising physician immediately available to respond to emergencies [facility] immediately available ER MD   Physician(s) [place] and [place]   Medication changes reported     No   Fall or balance concerns reported    No   Warm-up and Cool-down Performed on first and last piece of equipment   Resistance Training Performed Yes   VAD Patient? No   Pain Assessment   Currently in Pain? No/denies   Multiple Pain Sites No         Goals Met:  Proper associated with RPD/PD & O2 Sat Independence with exercise equipment Using PLB without cueing & demonstrates good technique Exercise tolerated well Strength training completed today  Goals Unmet:  Not Applicable  Comments: [name].  Will continue to monitor for progression.    Dr. Mark Miller is Medical Director for [facility] and [facility]. 

## 2016-06-03 ENCOUNTER — Encounter: Payer: Medicare Other | Admitting: Respiratory Therapy

## 2016-06-03 DIAGNOSIS — J449 Chronic obstructive pulmonary disease, unspecified: Secondary | ICD-10-CM | POA: Diagnosis not present

## 2016-06-03 NOTE — Progress Notes (Signed)
Daily Session Note  Patient Details  Name: Breanna Christian MRN: [id number] Date of Birth: 03-Feb-1941 Referring Provider:        Pulmonary Rehab from 05/30/2016 in [facility] Cardiac and Pulmonary Rehab   Referring Provider  Clayborn Bigness MD      Encounter Date: 06/03/2016  Check In:     Session Check In - 06/03/16 1310    Check-In   Location [facility]   Staff Present Frederich Cha, RRT, RCP, Respiratory Therapist;Carroll Enterkin, RN, Levie Heritage, MA, ACSM RCEP, Exercise Physiologist   Supervising physician immediately available to respond to emergencies [facility] immediately available ER MD   Physician(s) Dr. Kerman Passey and Jimmye Norman   Medication changes reported     No   Fall or balance concerns reported    No   Warm-up and Cool-down Performed on first and last piece of equipment   Resistance Training Performed Yes   VAD Patient? No   Pain Assessment   Currently in Pain? No/denies         Goals Met:  Proper associated with RPD/PD & O2 Sat Independence with exercise equipment Exercise tolerated well Strength training completed today  Goals Unmet:  Not Applicable  Comments: [name].  Will continue to monitor for progression.    Dr. Emily Filbert is Medical Director for [place] and [facility].

## 2016-06-06 ENCOUNTER — Encounter: Payer: Medicare Other | Admitting: *Deleted

## 2016-06-06 DIAGNOSIS — J449 Chronic obstructive pulmonary disease, unspecified: Secondary | ICD-10-CM | POA: Diagnosis not present

## 2016-06-06 NOTE — Progress Notes (Signed)
Daily Session Note  Patient Details  Name: Breanna Christian MRN: [id number] Date of Birth: May 05, 1941 Referring Provider:        Pulmonary Rehab from 05/30/2016 in [facility] Cardiac and Pulmonary Rehab   Referring Provider  Clayborn Bigness MD      Encounter Date: 06/06/2016  Check In:     Session Check In - 06/06/16 1058    Check-In   Location [facility]   Staff Present Carson Myrtle, BS, RRT, Respiratory Therapist;Shaun Runyon Amedeo Plenty, BS, ACSM CEP, Exercise Physiologist;Jessica Luan Pulling, MA, ACSM RCEP, Exercise Physiologist   Supervising physician immediately available to respond to emergencies [facility] immediately available ER MD   Physician(s) Joni Fears and Corky Downs   Medication changes reported     No   Fall or balance concerns reported    No   Warm-up and Cool-down Performed on first and last piece of equipment   Resistance Training Performed Yes   VAD Patient? No   Pain Assessment   Currently in Pain? No/denies   Multiple Pain Sites No         Goals Met:  Proper associated with RPD/PD & O2 Sat Independence with exercise equipment Exercise tolerated well Personal goals reviewed Strength training completed today  Goals Unmet:  Not Applicable  Comments: 6 min walk evaluation done today. Results were reviewed with patient.       6 Minute Walk      04/25/16 1542 06/06/16 1059     6 Minute Walk   Phase Initial Mid Program    Distance 1160 feet 1350 feet    Distance % Change 0 % 16 %    Walk Time 6 minutes 6 minutes    # of Rest Breaks 0 0    MPH 2.2 2.56    METS 2.68 2.96    RPE 12 13    Perceived Dyspnea  2 3    VO2 Peak  10.36    Symptoms No No    Resting HR 114 bpm 80 bpm    Resting BP 104/62 mmHg 116/70 mmHg    Max Ex. HR 140 bpm 125 bpm    Max Ex. BP 130/62 mmHg 160/80 mmHg    2 Minute Post BP  130/60 mmHg    Interval HR   Baseline HR  80    1 Minute HR  114    2 Minute HR  119    3 Minute HR  120    4 Minute HR  123    5 Minute HR  124     6 Minute HR  125    2 Minute Post HR  92    Interval Heart Rate?  Yes    Interval Oxygen   Interval Oxygen?  Yes    Baseline Oxygen Saturation %  99 %    Baseline Liters of Oxygen  0 L    1 Minute Oxygen Saturation %  95 %    1 Minute Liters of Oxygen  0 L    2 Minute Oxygen Saturation %  99 %    2 Minute Liters of Oxygen  0 L    3 Minute Oxygen Saturation %  100 %    3 Minute Liters of Oxygen  0 L    4 Minute Oxygen Saturation %  99 %    4 Minute Liters of Oxygen  0 L    5 Minute Oxygen Saturation %  99 %    5 Minute Liters of Oxygen  0 L    6 Minute Oxygen Saturation %  99 %    6 Minute Liters of Oxygen  0 L    2 Minute Post Oxygen Saturation %  98 %    2 Minute Post Liters of Oxygen  0 L          Dr. Emily Filbert is Medical Director for [place] and [facility].

## 2016-06-08 DIAGNOSIS — J449 Chronic obstructive pulmonary disease, unspecified: Secondary | ICD-10-CM | POA: Diagnosis not present

## 2016-06-08 NOTE — Progress Notes (Signed)
Daily Session Note  Patient Details  Name: Breanna Christian MRN: [id number] Date of Birth: 1941/11/18 Referring Provider:        Pulmonary Rehab from 05/30/2016 in [facility] Cardiac and Pulmonary Rehab   Referring Provider  Clayborn Bigness MD      Encounter Date: 06/08/2016  Check In:     Session Check In - 06/08/16 1114    Check-In   Location [facility]   Staff Present Heath Lark, RN, BSN, CCRP;Laureen Owens Shark, BS, RRT, Respiratory Dareen Piano, BA, ACSM CEP, Exercise Physiologist   Supervising physician immediately available to respond to emergencies [facility] immediately available ER MD   Physician(s) Cinda Quest and Jimmye Norman   Medication changes reported     No   Fall or balance concerns reported    No   Warm-up and Cool-down Performed on first and last piece of equipment   Resistance Training Performed Yes   VAD Patient? No   Pain Assessment   Currently in Pain? No/denies   Multiple Pain Sites No         Goals Met:  Proper associated with RPD/PD & O2 Sat Independence with exercise equipment Exercise tolerated well Strength training completed today  Goals Unmet:  Not Applicable  Comments: [name].  Will continue to monitor for progression.    Dr. Emily Filbert is Medical Director for [place] and [facility].

## 2016-06-10 ENCOUNTER — Encounter: Payer: Medicare Other | Admitting: *Deleted

## 2016-06-10 DIAGNOSIS — J449 Chronic obstructive pulmonary disease, unspecified: Secondary | ICD-10-CM | POA: Diagnosis not present

## 2016-06-10 NOTE — Progress Notes (Signed)
Daily Session Note  Patient Details  Name: Breanna Christian MRN: [id number] Date of Birth: [birth year] Referring Provider:        Pulmonary Rehab from 05/30/2016 in [facility] Cardiac and Pulmonary Rehab   Referring Provider  Clayborn Bigness MD      Encounter Date: 06/10/2016  Check In:     Session Check In - 06/10/16 1148    Check-In   Location [facility]   Staff Present Gerlene Burdock, RN, Vickki Hearing, BA, ACSM CEP, Exercise Physiologist;Stacey Blanch Media, RRT, RCP, Respiratory Therapist   Supervising physician immediately available to respond to emergencies [facility] immediately available ER MD   Physician(s) Dr. Jeral Fruit and Dr. Cinda Quest   Medication changes reported     No   Fall or balance concerns reported    No   Warm-up and Cool-down Performed on first and last piece of equipment   Resistance Training Performed Yes   VAD Patient? No   Pain Assessment   Currently in Pain? No/denies         Goals Met:  Proper associated with RPD/PD & O2 Sat Exercise tolerated well  Goals Unmet:  Not Applicable  Comments:     Dr. Emily Filbert is Medical Director for [place] and [facility].

## 2016-06-13 ENCOUNTER — Encounter: Payer: Medicare Other | Admitting: *Deleted

## 2016-06-13 DIAGNOSIS — J449 Chronic obstructive pulmonary disease, unspecified: Secondary | ICD-10-CM | POA: Diagnosis not present

## 2016-06-13 NOTE — Progress Notes (Signed)
Daily Session Note  Patient Details  Name: Breanna Christian MRN: [id number] Date of Birth: 1941-04-16 Referring Provider:   Flowsheet Row Pulmonary Rehab from 05/30/2016 in [facility] Cardiac and Pulmonary Rehab  Referring Provider  Clayborn Bigness MD      Encounter Date: 06/13/2016  Check In:     Session Check In - 06/13/16 1150      Check-In   Location [facility]   Staff Present Carson Myrtle, BS, RRT, Respiratory Therapist;Seynabou Fults Amedeo Plenty, BS, ACSM CEP, Exercise Physiologist;Jessica Luan Pulling, MA, ACSM RCEP, Exercise Physiologist   Supervising physician immediately available to respond to emergencies [facility] immediately available ER MD   Physician(s) Drs. Archie Balboa and [place]   Medication changes reported     No   Fall or balance concerns reported    No   Warm-up and Cool-down Performed on first and last piece of equipment   Resistance Training Performed Yes   VAD Patient? No     Pain Assessment   Currently in Pain? No/denies   Multiple Pain Sites No         Goals Met:  Proper associated with RPD/PD & O2 Sat Independence with exercise equipment Exercise tolerated well Personal goals reviewed Strength training completed today  Goals Unmet:  Not Applicable  Comments: Patient completed exercise prescription and all exercise goals during rehab session. The exercise was tolerated well and the patient is progressing in the program.     Dr. Emily Filbert is Medical Director for [place] and [facility].

## 2016-06-15 ENCOUNTER — Encounter: Payer: Medicare Other | Admitting: *Deleted

## 2016-06-15 DIAGNOSIS — J449 Chronic obstructive pulmonary disease, unspecified: Secondary | ICD-10-CM | POA: Diagnosis not present

## 2016-06-15 NOTE — Progress Notes (Signed)
Daily Session Note  Patient Details  Name: Breanna Christian MRN: [id number] Date of Birth: Feb 08, 1941 Referring Provider:   Flowsheet Row Pulmonary Rehab from 05/30/2016 in [facility] Cardiac and Pulmonary Rehab  Referring Provider  Clayborn Bigness MD      Encounter Date: 06/15/2016  Check In:     Session Check In - 06/15/16 1316      Check-In   Staff Present Heath Lark, RN, BSN, CCRP;Laureen Owens Shark, BS, RRT, Respiratory Dareen Piano, BA, ACSM CEP, Exercise Physiologist   Supervising physician immediately available to respond to emergencies [facility] immediately available ER MD   Physician(s) Dr. Alfred Levins and Corky Downs   Medication changes reported     No   Fall or balance concerns reported    No   Warm-up and Cool-down Performed on first and last piece of equipment   Resistance Training Performed Yes   VAD Patient? No     Pain Assessment   Currently in Pain? No/denies   Multiple Pain Sites No         Goals Met:  Independence with exercise equipment Exercise tolerated well Strength training completed today  Goals Unmet:  Not Applicable  Comments: Doing well with exercise prescription progression. Required Nebulizer treatment today    Dr. Emily Filbert is Medical Director for [place] and [facility].

## 2016-06-17 ENCOUNTER — Encounter: Payer: Medicare Other | Admitting: *Deleted

## 2016-06-17 DIAGNOSIS — J449 Chronic obstructive pulmonary disease, unspecified: Secondary | ICD-10-CM

## 2016-06-17 NOTE — Progress Notes (Signed)
Daily Session Note  Patient Details  Name: Breanna Christian MRN: [id number] Date of Birth: 11-10-1941 Referring Provider:   Flowsheet Row Pulmonary Rehab from 05/30/2016 in [facility] Cardiac and Pulmonary Rehab  Referring Provider  Clayborn Bigness MD      Encounter Date: 06/17/2016  Check In:     Session Check In - 06/17/16 1125      Check-In   Location [facility]   Staff Present Gerlene Burdock, RN, BSN;Stacey Blanch Media, RRT, RCP, Respiratory Therapist;Mary Kellie Shropshire, RN, BSN, MA   Supervising physician immediately available to respond to emergencies [facility] immediately available ER MD   Physician(s) DR. Edd Fabian and Dr. Clearnce Hasten   Medication changes reported     No   Fall or balance concerns reported    No   Warm-up and Cool-down Performed on first and last piece of equipment   Resistance Training Performed Yes   VAD Patient? No     Pain Assessment   Currently in Pain? No/denies         Goals Met:  Proper associated with RPD/PD & O2 Sat Exercise tolerated well  Goals Unmet:  Not Applicable  Comments:     Dr. Emily Filbert is Medical Director for [place] and [facility].

## 2016-06-20 ENCOUNTER — Encounter: Payer: Medicare Other | Admitting: *Deleted

## 2016-06-20 DIAGNOSIS — J449 Chronic obstructive pulmonary disease, unspecified: Secondary | ICD-10-CM | POA: Diagnosis not present

## 2016-06-20 NOTE — Progress Notes (Signed)
Daily Session Note  Patient Details  Name: Breanna Christian MRN: [id number] Date of Birth: 12-Jun-1941 Referring Provider:   Flowsheet Row Pulmonary Rehab from 05/30/2016 in [facility] Cardiac and Pulmonary Rehab  Referring Provider  Clayborn Bigness MD      Encounter Date: 06/20/2016  Check In:     Session Check In - 06/20/16 1053      Check-In   Location [facility]   Staff Present Carson Myrtle, BS, RRT, Respiratory Therapist;Mystery Schrupp Amedeo Plenty, BS, ACSM CEP, Exercise Physiologist;Jessica Luan Pulling, MA, ACSM RCEP, Exercise Physiologist   Supervising physician immediately available to respond to emergencies [facility] immediately available ER MD   Physician(s) Kerman Passey and [place]    Medication changes reported     No   Fall or balance concerns reported    No   Warm-up and Cool-down Performed on first and last piece of equipment   Resistance Training Performed Yes   VAD Patient? No     Pain Assessment   Currently in Pain? No/denies   Multiple Pain Sites No         Goals Met:  Proper associated with RPD/PD & O2 Sat Independence with exercise equipment Exercise tolerated well Strength training completed today  Goals Unmet:  Not Applicable  Comments: Patient completed exercise prescription and all exercise goals during rehab session. The exercise was tolerated well and the patient is progressing in the program.     Dr. Emily Filbert is Medical Director for [place] and [facility].

## 2016-06-22 ENCOUNTER — Encounter: Payer: Medicare Other | Attending: Internal Medicine | Admitting: *Deleted

## 2016-06-22 DIAGNOSIS — J449 Chronic obstructive pulmonary disease, unspecified: Secondary | ICD-10-CM | POA: Insufficient documentation

## 2016-06-22 NOTE — Progress Notes (Signed)
Daily Session Note  Patient Details  Name: Breanna Christian MRN: [id number] Date of Birth: 07-15-1941 Referring Provider:   Flowsheet Row Pulmonary Rehab from 05/30/2016 in [facility] Cardiac and Pulmonary Rehab  Referring Provider  Clayborn Bigness MD      Encounter Date: 06/22/2016  Check In:     Session Check In - 06/22/16 1200      Check-In   Location [facility]   Staff Present Heath Lark, RN, BSN, CCRP;Laureen Owens Shark, BS, RRT, Respiratory Dareen Piano, BA, ACSM CEP, Exercise Physiologist   Supervising physician immediately available to respond to emergencies [facility] immediately available ER MD   Physician(s) Drs: Quentin Cornwall and Marcelene [place]   Medication changes reported     No   Fall or balance concerns reported    No   Warm-up and Cool-down Performed on first and last piece of equipment   Resistance Training Performed Yes   VAD Patient? No     Pain Assessment   Currently in Pain? No/denies         Goals Met:  Proper associated with RPD/PD & O2 Sat Independence with exercise equipment Exercise tolerated well Strength training completed today  Goals Unmet:  Not Applicable  Comments: Doing well with exercise prescription progression.    Dr. Emily Filbert is Medical Director for [place] and [facility].

## 2016-06-24 ENCOUNTER — Encounter: Payer: Medicare Other | Admitting: *Deleted

## 2016-06-24 DIAGNOSIS — J449 Chronic obstructive pulmonary disease, unspecified: Secondary | ICD-10-CM

## 2016-06-24 NOTE — Progress Notes (Signed)
Daily Session Note  Patient Details  Name: Breanna Christian MRN: [id number] Date of Birth: 09-Jun-1941 Referring Provider:   Flowsheet Row Pulmonary Rehab from 05/30/2016 in [facility] Cardiac and Pulmonary Rehab  Referring Provider  Clayborn Bigness MD      Encounter Date: 06/24/2016  Check In:     Session Check In - 06/24/16 1207      Check-In   Staff Present Heath Lark, RN, BSN, CCRP;Laureen Owens Shark, BS, RRT, Respiratory Therapist;Carroll Enterkin, RN, BSN   Supervising physician immediately available to respond to emergencies [facility] immediately available ER MD   Physician(s) Drs: Kerman Passey and Darl Householder   Medication changes reported     No   Fall or balance concerns reported    No   Warm-up and Cool-down Performed on first and last piece of equipment   Resistance Training Performed Yes   VAD Patient? No     Pain Assessment   Currently in Pain? No/denies         Goals Met:  Independence with exercise equipment Exercise tolerated well Strength training completed today  Goals Unmet:  Not Applicable  Comments: Doing well with exercise prescription progression.    Dr. Emily Filbert is Medical Director for [place] and [facility].

## 2016-06-27 ENCOUNTER — Encounter: Payer: Self-pay | Admitting: *Deleted

## 2016-06-27 ENCOUNTER — Encounter: Payer: Medicare Other | Admitting: *Deleted

## 2016-06-27 DIAGNOSIS — J449 Chronic obstructive pulmonary disease, unspecified: Secondary | ICD-10-CM

## 2016-06-27 NOTE — Progress Notes (Signed)
Daily Session Note  Patient Details  Name: Breanna Christian MRN: [id number] Date of Birth: November 27, 1940 Referring Provider:   Flowsheet Row Pulmonary Rehab from 05/30/2016 in [facility] Cardiac and Pulmonary Rehab  Referring Provider  Clayborn Bigness MD      Encounter Date: 06/27/2016  Check In:     Session Check In - 06/27/16 1249      Check-In   Location [facility]   Staff Present Alberteen Sam, MA, ACSM RCEP, Exercise Physiologist;Kelly Amedeo Plenty, BS, ACSM CEP, Exercise Physiologist;Laureen Janell Quiet, RRT, Respiratory Therapist   Supervising physician immediately available to respond to emergencies [facility] immediately available ER MD   Physician(s) Burlene Arnt and [place]   Medication changes reported     No   Fall or balance concerns reported    No   Warm-up and Cool-down Performed on first and last piece of equipment   Resistance Training Performed Yes   VAD Patient? No     Pain Assessment   Currently in Pain? No/denies   Multiple Pain Sites No         Goals Met:  Proper associated with RPD/PD & O2 Sat Independence with exercise equipment Exercise tolerated well No report of cardiac concerns or symptoms Strength training completed today  Goals Unmet:  Not Applicable  Comments: [name].  Will continue to monitor for progression.  Reviewed home exercise with pt today.  Pt plans to walk at home for exercise.  Reviewed THR, pulse, RPE, sign and symptoms, and when to call 911 or MD.  Pt plans to purchase a pulse oximeter for home use to monitor heart rate and oxygen saturations at home.  Also discussed weather considerations and indoor options.  Pt voiced understanding.   Dr. Emily Filbert is Medical Director for [place] and [facility].

## 2016-06-27 NOTE — Progress Notes (Signed)
Pulmonary Individual Treatment Plan  Patient Details  Name: Breanna Christian MRN: [id number] Date of Birth: 1941/06/15 Referring Provider:   Flowsheet Row Pulmonary Rehab from 05/30/2016 in [facility] Cardiac and Pulmonary Rehab  Referring Provider  Beverely Risen MD      Initial Encounter Date:  Flowsheet Row Pulmonary Rehab from 05/30/2016 in [facility] Cardiac and Pulmonary Rehab  Referring Provider  Beverely Risen MD      Visit Diagnosis: COPD, moderate (HCC)  Patient's Home Medications on Admission:  Current Outpatient Prescriptions:  .  aspirin EC 81 MG tablet, Take by mouth., Disp: , Rfl:  .  benzonatate (TESSALON) 100 MG capsule, Take by mouth 3 (three) times daily as needed for cough., Disp: , Rfl:  .  Calcium Carbonate-Vitamin D (CALCIUM-VITAMIN D) 500-200 MG-UNIT tablet, Take by mouth., Disp: , Rfl:  .  Cyanocobalamin (RA VITAMIN B-12 TR) 1000 MCG TBCR, Take by mouth., Disp: , Rfl:  .  diltiazem (TIAZAC) 240 MG 24 hr capsule, , Disp: , Rfl:  .  Fluticasone-Salmeterol (ADVAIR) 250-50 MCG/DOSE AEPB, Inhale 1 puff into the lungs 2 (two) times daily., Disp: , Rfl:  .  folic acid (FOLVITE) 1 MG tablet, Take by mouth., Disp: , Rfl:  .  ipratropium-albuterol (DUONEB) 0.5-2.5 (3) MG/3ML SOLN, Take 3 mLs by nebulization., Disp: , Rfl:  .  Multiple Vitamin (MULTI-VITAMINS) TABS, Take by mouth. Reported on 04/25/2016, Disp: , Rfl:  .  predniSONE (DELTASONE) 10 MG tablet, Take 10 mg by mouth daily with breakfast. Reported on 04/25/2016, Disp: , Rfl:  .  roflumilast (DALIRESP) 500 MCG TABS tablet, Take 500 mcg by mouth daily., Disp: , Rfl:  .  tiotropium (SPIRIVA) 18 MCG inhalation capsule, Place 18 mcg into inhaler and inhale daily., Disp: , Rfl:  .  triamterene-hydrochlorothiazide (DYAZIDE) 37.5-25 MG capsule, Take by mouth., Disp: , Rfl:  .  Vitamin D, Ergocalciferol, (DRISDOL) 50000 units CAPS capsule, Take by mouth., Disp: , Rfl:   Past Medical History: Past Medical History:  Diagnosis Date  .  Non-Hodgkin lymphoma (HCC) 2003  . Rheumatoid arthritis (HCC)   . Xerostomia     Tobacco Use: History  Smoking Status  . Never Smoker  Smokeless Tobacco  . Not on file    Labs: Recent Review Flowsheet Data    There is no flowsheet data to display.       ADL UCSD:     Pulmonary Assessment Scores    Row Name 04/26/16 1021 06/10/16 1251       ADL UCSD   ADL Phase Entry Mid    SOB Score total 48 73    Rest 1 0    Walk 2 2    Stairs 5 5    Bath 2 0    Dress 1 3    Shop 3 3       Pulmonary Function Assessment:     Pulmonary Function Assessment - 02/10/16 1017      Pulmonary Function Tests   RV% 154 %   DLCO% 47 %     Initial Spirometry Results   FVC% 55 %   FEV1% 53 %   FEV1/FVC Ratio 77     Post Bronchodilator Spirometry Results   FVC% 68 %   FEV1% 56 %   FEV1/FVC Ratio 66     Breath   Shortness of Breath Yes;Limiting activity      Exercise Target Goals:    Exercise Program Goal: Individual exercise prescription set with THRR, safety & activity barriers.  Participant demonstrates ability to understand and report RPE using BORG scale, to self-measure pulse accurately, and to acknowledge the importance of the exercise prescription.  Exercise Prescription Goal: Starting with aerobic activity 30 plus minutes a day, 3 days per week for initial exercise prescription. Provide home exercise prescription and guidelines that participant acknowledges understanding prior to discharge.  Activity Barriers & Risk Stratification:     Activity Barriers & Cardiac Risk Stratification - 04/25/16 1445      Activity Barriers & Cardiac Risk Stratification   Activity Barriers Deconditioning;Muscular Weakness;Shortness of Breath      6 Minute Walk:     6 Minute Walk    Row Name 04/25/16 1542 06/06/16 1059       6 Minute Walk   Phase Initial Mid Program    Distance 1160 feet 1350 feet    Distance % Change 0 % 16 %    Walk Time 6 minutes 6 minutes    # of  Rest Breaks 0 0    MPH 2.2 2.56    METS 2.68 2.96    RPE 12 13    Perceived Dyspnea  2 3    VO2 Peak  - 10.36    Symptoms No No    Resting HR 114 bpm 80 bpm    Resting BP 104/62 116/70    Max Ex. HR 140 bpm 125 bpm    Max Ex. BP 130/62 160/80    2 Minute Post BP  - 130/60      Interval HR   Baseline HR  - 80    1 Minute HR  - 114    2 Minute HR  - 119    3 Minute HR  - 120    4 Minute HR  - 123    5 Minute HR  - 124    6 Minute HR  - 125    2 Minute Post HR  - 92    Interval Heart Rate?  - Yes      Interval Oxygen   Interval Oxygen?  - Yes    Baseline Oxygen Saturation %  - 99 %    Baseline Liters of Oxygen  - 0 L    1 Minute Oxygen Saturation %  - 95 %    1 Minute Liters of Oxygen  - 0 L    2 Minute Oxygen Saturation %  - 99 %    2 Minute Liters of Oxygen  - 0 L    3 Minute Oxygen Saturation %  - 100 %    3 Minute Liters of Oxygen  - 0 L    4 Minute Oxygen Saturation %  - 99 %    4 Minute Liters of Oxygen  - 0 L    5 Minute Oxygen Saturation %  - 99 %    5 Minute Liters of Oxygen  - 0 L    6 Minute Oxygen Saturation %  - 99 %    6 Minute Liters of Oxygen  - 0 L    2 Minute Post Oxygen Saturation %  - 98 %    2 Minute Post Liters of Oxygen  - 0 L       Initial Exercise Prescription:     Initial Exercise Prescription - 05/30/16 1200      Date of Initial Exercise RX and Referring Provider   Referring Provider Beverely Risen MD      Perform Capillary Blood Glucose checks as needed.  Exercise Prescription Changes:     Exercise Prescription Changes    Row Name 05/02/16 1200 05/06/16 1200 05/11/16 1400 05/25/16 1400 06/08/16 1400     Exercise Review   Progression  -  - Yes Yes Yes     Response to Exercise   Blood Pressure (Admit) 124/70  - 100/60 126/70 110/60   Blood Pressure (Exercise) 136/70  - 146/68 124/64 146/62   Blood Pressure (Exit) 106/62  - 108/60 90/60  rck after water 96/60 112/62   Heart Rate (Admit) 95 bpm  - 60 bpm 108 bpm 116 bpm   Heart  Rate (Exercise) 119 bpm  - 126 bpm 122 bpm 130 bpm   Heart Rate (Exit) 94 bpm  - 92 bpm 101 bpm 103 bpm   Oxygen Saturation (Admit) 97 %  - 94 % 97 % 97 %   Oxygen Saturation (Exercise) 93 %  - 97 % 98 % 98 %   Oxygen Saturation (Exit) 97 %  - 97 % 97 % 97 %   Rating of Perceived Exertion (Exercise) 11  - _0 Perceived Dyspnea (Exercise) 2  - _1 Symptoms none  - none none none   Comments  -  -  - Home Exercise Given 05/18/16 Home Exercise Given 05/18/16   Duration Progress to 30 minutes of continuous aerobic without signs/symptoms of physical distress Progress to 30 minutes of continuous aerobic without signs/symptoms of physical distress Progress to 45 minutes of aerobic exercise without signs/symptoms of physical distress Progress to 45 minutes of aerobic exercise without signs/symptoms of physical distress Progress to 45 minutes of aerobic exercise without signs/symptoms of physical distress   Intensity THRR unchanged  135-149 THRR unchanged  135-149 THRR unchanged THRR unchanged THRR unchanged     Progression   Progression Continue progressive overload as per policy without signs/symptoms or physical distress. Continue progressive overload as per policy without signs/symptoms or physical distress. Continue to progress workloads to maintain intensity without signs/symptoms of physical distress. Continue to progress workloads to maintain intensity without signs/symptoms of physical distress. Continue to progress workloads to maintain intensity without signs/symptoms of physical distress.   Average METs  -  - 2.5 2.34 2.47     Resistance Training   Training Prescription _2    Weight _3 Reps 10-15 10-15 10-15 10-15 10-15     Interval Training   Interval Training  -  - No No No     Treadmill   MPH _4 2.5   Grade 0 0 0 0 0   Minutes _5 NuStep   Level 3  Omit today due to time. Was oriented to machine.  3  Omit today due to time.  Was oriented to machine.  _6 Watts _7 2.0 METs -  2.5 METs   Minutes _8 Biostep-RELP   Level _9 Watts _10 2.5 METs -  2.0 METs   Minutes _11 Home Exercise Plan   Plans to continue exercise at  -  -  - Home  Walking at [facility]  Walking at [place] 1 additional day to program exercise  sessions. Add 1 additional day to program exercise sessions.   [place] Name 06/22/16 1500             Exercise Review   Progression Yes         Response to Exercise   Blood Pressure (Admit) 110/60       Blood Pressure (Exercise) 128/60       Blood Pressure (Exit) 134/70       Heart Rate (Admit) 96 bpm       Heart Rate (Exercise) 124 bpm       Heart Rate (Exit) 98 bpm       Oxygen Saturation (Admit) 96 %       Oxygen Saturation (Exercise) 97 %       Oxygen Saturation (Exit) 97 %       Rating of Perceived Exertion (Exercise) 13       Perceived Dyspnea (Exercise) 3       Symptoms none       Comments Home Exercise Given 05/18/16       Duration Progress to 45 minutes of aerobic exercise without signs/symptoms of physical distress       Intensity THRR unchanged         Progression   Progression Continue to progress workloads to maintain intensity without signs/symptoms of physical distress.       Average METs 3.23         Resistance Training   Training Prescription Yes       Weight 2       Reps 10-15         Interval Training   Interval Training No         Treadmill   MPH 3       Grade 0.5       Minutes 15         NuStep   Level 3       Watts -  3.1 METs       Minutes 15         Biostep-RELP   Level 3       Watts -  3.0 METs       Minutes 15         Home Exercise Plan   Plans to continue exercise at Home  Walking at [place] 1 additional day to program exercise sessions.          Exercise Comments:     Exercise Comments    Row Name 05/02/16 1158 05/11/16  1435 05/18/16 1250 05/25/16 1426 06/06/16 1103   Exercise Comments First full day of exercise!  Patient was oriented to gym and equipment including functions, settings, policies, and procedures.  Patient's individual exercise prescription and treatment plan were reviewed.  All starting workloads were established based on the results of the 6 minute walk test done at initial orientation visit.  The plan for exercise progression was also introduced and progression will be customized based on patient's performance and goals. Coralyn Mark is off to a good start with her exercise.  She has already increased her time to 15 min on both BioStep and NuStep.  She is increasing her time on the treadmill.  We will continue to work with Coralyn Mark on her progression. Coralyn Mark was sedentary before beginning due to caring for her hisband who passed way - she walks once per week at [place] outside sessions at [facility]  She wants to get back to  gardening.  Her son is helping her decide where to exrecise when she graduates LW> Coralyn Mark continues to do well with exercise.  She is making good progress on exercise and is now up to 12 minutes on the treadmill.  We will continue to work with her on progression. 6 min walk evaluation done today. Results were reviewed with patient.    [place] Name 06/08/16 1442 06/22/16 1459         Exercise Comments Coralyn Mark is now up to 15 min continuous on the treadmill.  She continues to make good progress with exercise. Coralyn Mark is doing well with exercsie.  She continues to make improvements and is now up to 3.0 mph on treadmill for full 15 min. We will continue to monitor for progression.         Discharge Exercise Prescription (Final Exercise Prescription Changes):     Exercise Prescription Changes - 06/22/16 1500      Exercise Review   Progression Yes     Response to Exercise   Blood Pressure (Admit) 110/60   Blood Pressure (Exercise) 128/60   Blood Pressure (Exit) 134/70   Heart Rate (Admit) 96 bpm   Heart  Rate (Exercise) 124 bpm   Heart Rate (Exit) 98 bpm   Oxygen Saturation (Admit) 96 %   Oxygen Saturation (Exercise) 97 %   Oxygen Saturation (Exit) 97 %   Rating of Perceived Exertion (Exercise) 13   Perceived Dyspnea (Exercise) 3   Symptoms none   Comments Home Exercise Given 05/18/16   Duration Progress to 45 minutes of aerobic exercise without signs/symptoms of physical distress   Intensity THRR unchanged     Progression   Progression Continue to progress workloads to maintain intensity without signs/symptoms of physical distress.   Average METs 3.23     Resistance Training   Training Prescription Yes   Weight 2   Reps 10-15     Interval Training   Interval Training No     Treadmill   MPH 3   Grade 0.5   Minutes 15     NuStep   Level 3   Watts --  3.1 METs   Minutes 15     Biostep-RELP   Level 3   Watts --  3.0 METs   Minutes 15     Home Exercise Plan   Plans to continue exercise at Home  Walking at [place] 1 additional day to program exercise sessions.       Nutrition:  Target Goals: Understanding of nutrition guidelines, daily intake of sodium '1500mg'$ , cholesterol '200mg'$ , calories 30% from fat and 7% or less from saturated fats, daily to have 5 or more servings of fruits and vegetables.  Biometrics:     Pre Biometrics - 04/25/16 1554      Pre Biometrics   Height 5' 2.5" (1.588 m)   Weight 108 lb 11.2 oz (49.3 kg)   Waist Circumference 28.3 inches   Hip Circumference 34 inches   Waist to Hip Ratio 0.83 %   BMI (Calculated) 19.6       Nutrition Therapy Plan and Nutrition Goals:     Nutrition Therapy & Goals - 04/25/16 1440      Intervention Plan   Intervention Prescribe, educate and counsel regarding individualized specific dietary modifications aiming towards targeted core components such as weight, hypertension, lipid management, diabetes, heart failure and other comorbidities.   Expected Outcomes Short Term Goal:  Understand basic principles of dietary content, such as  calories, fat, sodium, cholesterol and nutrients.;Short Term Goal: A plan has been developed with personal nutrition goals set during dietitian appointment.;Long Term Goal: Adherence to prescribed nutrition plan.      Nutrition Discharge: Rate Your Plate Scores:   Psychosocial: Target Goals: Acknowledge presence or absence of depression, maximize coping skills, provide positive support system. Participant is able to verbalize types and ability to use techniques and skills needed for reducing stress and depression.  Initial Review & Psychosocial Screening:     Initial Psych Review & Screening - 04/25/16 1441      Initial Review   Current issues with Current Sleep Concerns     Family Dynamics   Good Support System? Yes  Children nearby   Concerns Recent loss of significant other   Comments Spouse died this past 22-Mar-2023  Married since 1974           Barriers   Psychosocial barriers to participate in program There are no identifiable barriers or psychosocial needs.;The patient should benefit from training in stress management and relaxation.     Screening Interventions   Interventions Encouraged to exercise      Quality of Life Scores:     Quality of Life - 04/26/16 1027      Quality of Life Scores   Health/Function Pre 11.53 %   Socioeconomic Pre 17.1 %   Psych/Spiritual Pre 23.71 %   Family Pre 25.5 %   GLOBAL Pre 16.98 %      PHQ-9: Recent Review Flowsheet Data    Depression screen [facility] 2/9 04/25/2016   Decreased Interest 0   Down, Depressed, Hopeless 0   PHQ - 2 Score 0   Altered sleeping 3   Tired, decreased energy 3   Change in appetite 3   Feeling bad or failure about yourself  0   Trouble concentrating 0   Moving slowly or fidgety/restless 0   Suicidal thoughts 0   PHQ-9 Score 9   Difficult doing work/chores Somewhat difficult      Psychosocial Evaluation and Intervention:     Psychosocial  Evaluation - 05/04/16 1037      Psychosocial Evaluation & Interventions   Interventions Encouraged to exercise with the program and follow exercise prescription   Comments Counselor met with Ms. Laski (Terri) today for initial psychosocial evaluation.  She is a 75 year old who is not quite sure what her "lung problem" is.  She has a very strong [place] accent and was difficult to understand at times.  Terri lives alone since her spouse of 42 years passed away several months ago.  Her (3) children live closeby and check in on her often.  Karna Christmas is also involved in the local church.  She states she does not sleep well nor does she have a good appetite and reports losing almost 50 pounds over the past two years while taking care of her spouse's health care needs.  She denies a history of depression or anxiety and admits to some sadness and general loss of interest in doing much since her spouse passed.  Terri reports "not much" stress in her life and has goals to just "feel better" while in this program.  Counselor will follow with Terri while here to determine if consistent exercise makes a difference in her sleep patterns and appetite.  If not, counselor will recommend speaking with her doctor about a medication evaluation or speaking with a therapist to address her grief/loss impacting her mood.  Continued Psychosocial Services Needed Yes  Karna Christmas will benefit from the psychoeducational components of this program - particularly depression.  She may need to see a Dr. about a medication evaluation for her mood, or a therapist.      Psychosocial Re-Evaluation:     Psychosocial Re-Evaluation    [place] Name 06/08/16 1043 06/22/16 1202           Psychosocial Re-Evaluation   Comments Counselor follow up with Ms. Nigh today reporting feeling "more alive" since coming into this program and working out consistently.  She is able to go up and down stairs with greater ease and states her mood has improved as  well.  She continues to struggle some with sleep issues but states she is okay with taking naps.  counselor commended Ms. Briones for her dedication and commitment to consistent exercise.   At face to face meeting today with Dr Emily Filbert, Coralyn Mark shared that she is feeling much stronger than her first day with the program. She stated that the exercise sessions in Pulmonary Rehab have helped her improve her strength and stamina.  She also stated that she had been depressed since her husbands death in 03-06-2023. Since she started the Pulmonary Rehab program, her depression is improved and she is doing more than sitting at home on the couch.         Education: Education Goals: Education classes will be provided on a weekly basis, covering required topics. Participant will state understanding/return demonstration of topics presented.  Learning Barriers/Preferences:     Learning Barriers/Preferences - 04/25/16 1445      Learning Barriers/Preferences   Learning Barriers None   Learning Preferences None      Education Topics: Initial Evaluation Education: - Verbal, written and demonstration of respiratory meds, RPE/PD scales, oximetry and breathing techniques. Instruction on use of nebulizers and MDIs: cleaning and proper use, rinsing mouth with steroid doses and importance of monitoring MDI activations.   General Nutrition Guidelines/Fats and Fiber: -Group instruction provided by verbal, written material, models and posters to present the general guidelines for heart healthy nutrition. Gives an explanation and review of dietary fats and fiber. Flowsheet Row Pulmonary Rehab from 06/24/2016 in [facility] Cardiac and Pulmonary Rehab  Date  06/13/16  Educator  CR  Instruction Review Code  2- meets goals/outcomes      Controlling Sodium/Reading Food Labels: -Group verbal and written material supporting the discussion of sodium use in heart healthy nutrition. Review and explanation with models, verbal and  written materials for utilization of the food label. Flowsheet Row Pulmonary Rehab from 06/24/2016 in [facility] Cardiac and Pulmonary Rehab  Date  05/09/16  Educator  CR  Instruction Review Code  2- meets goals/outcomes      Exercise Physiology & Risk Factors: - Group verbal and written instruction with models to review the exercise physiology of the cardiovascular system and associated critical values. Details cardiovascular disease risk factors and the goals associated with each risk factor.   Aerobic Exercise & Resistance Training: - Gives group verbal and written discussion on the health impact of inactivity. On the components of aerobic and resistive training programs and the benefits of this training and how to safely progress through these programs.   Flexibility, Balance, General Exercise Guidelines: - Provides group verbal and written instruction on the benefits of flexibility and balance training programs. Provides general exercise guidelines with specific guidelines to those with heart or lung disease. Demonstration and skill practice provided. Flowsheet Row Pulmonary Rehab from  06/24/2016 in [facility] Cardiac and Pulmonary Rehab  Date  06/15/16  Educator  AS  Instruction Review Code  2- meets goals/outcomes      Stress Management: - Provides group verbal and written instruction about the health risks of elevated stress, cause of high stress, and healthy ways to reduce stress.   Depression: - Provides group verbal and written instruction on the correlation between heart/lung disease and depressed mood, treatment options, and the stigmas associated with seeking treatment. Flowsheet Row Pulmonary Rehab from 06/24/2016 in [facility] Cardiac and Pulmonary Rehab  Date  05/04/16  Educator  [facility]  Instruction Review Code  2- meets goals/outcomes      Exercise & Equipment Safety: - Individual verbal instruction and demonstration of equipment use and safety with use of the equipment. Flowsheet Row  Pulmonary Rehab from 06/24/2016 in [facility] Cardiac and Pulmonary Rehab  Date  04/25/16  Educator  SB  Instruction Review Code  2- meets goals/outcomes      Infection Prevention: - Provides verbal and written material to individual with discussion of infection control including proper hand washing and proper equipment cleaning during exercise session. Flowsheet Row Pulmonary Rehab from 06/24/2016 in [facility] Cardiac and Pulmonary Rehab  Date  04/25/16  Educator  Sb  Instruction Review Code  2- meets goals/outcomes      Falls Prevention: - Provides verbal and written material to individual with discussion of falls prevention and safety. Flowsheet Row Pulmonary Rehab from 06/24/2016 in [facility] Cardiac and Pulmonary Rehab  Date  04/25/16  Educator  Sb  Instruction Review Code  2- meets goals/outcomes      Diabetes: - Individual verbal and written instruction to review signs/symptoms of diabetes, desired ranges of glucose level fasting, after meals and with exercise. Advice that pre and post exercise glucose checks will be done for 3 sessions at entry of program.   Chronic Lung Diseases: - Group verbal and written instruction to review new updates, new respiratory medications, new advancements in procedures and treatments. Provide informative websites and "800" numbers of self-education. Flowsheet Row Pulmonary Rehab from 06/24/2016 in [facility] Cardiac and Pulmonary Rehab  Date  06/06/16  Educator  LB  Instruction Review Code  2- meets goals/outcomes      Lung Procedures: - Group verbal and written instruction to describe testing methods done to diagnose lung disease. Review the outcome of test results. Describe the treatment choices: Pulmonary Function Tests, ABGs and oximetry. Flowsheet Row Pulmonary Rehab from 06/24/2016 in [facility] Cardiac and Pulmonary Rehab  Date  06/10/16  Educator  Tamsen Snider, RT  Instruction Review Code  2- meets goals/outcomes      Energy Conservation: - Provide group  verbal and written instruction for methods to conserve energy, plan and organize activities. Instruct on pacing techniques, use of adaptive equipment and posture/positioning to relieve shortness of breath. Flowsheet Row Pulmonary Rehab from 06/24/2016 in [facility] Cardiac and Pulmonary Rehab  Date  06/08/16  Educator  JH/AS  Instruction Review Code  2- meets goals/outcomes      Triggers: - Group verbal and written instruction to review types of environmental controls: home humidity, furnaces, filters, dust mite/pet prevention, HEPA vacuums. To discuss weather changes, air quality and the benefits of nasal washing.   Exacerbations: - Group verbal and written instruction to provide: warning signs, infection symptoms, calling MD promptly, preventive modes, and value of vaccinations. Review: effective airway clearance, coughing and/or vibration techniques. Create an Sport and exercise psychologist. Flowsheet Row Pulmonary Rehab from 06/24/2016 in [facility] Cardiac and Pulmonary  Rehab  Date  05/16/16  Educator  LB  Instruction Review Code  2- meets goals/outcomes      Oxygen: - Individual and group verbal and written instruction on oxygen therapy. Includes supplement oxygen, available portable oxygen systems, continuous and intermittent flow rates, oxygen safety, concentrators, and Medicare reimbursement for oxygen.   Respiratory Medications: - Group verbal and written instruction to review medications for lung disease. Drug class, frequency, complications, importance of spacers, rinsing mouth after steroid MDI's, and proper cleaning methods for nebulizers.   AED/CPR: - Group verbal and written instruction with the use of models to demonstrate the basic use of the AED with the basic ABC's of resuscitation. Flowsheet Row Pulmonary Rehab from 06/24/2016 in [facility] Cardiac and Pulmonary Rehab  Date  06/24/16  Educator  CE  Instruction Review Code  2- meets goals/outcomes      Breathing Retraining: - Provides individuals  verbal and written instruction on purpose, frequency, and proper technique of diaphragmatic breathing and pursed-lipped breathing. Applies individual practice skills.   Anatomy and Physiology of the Lungs: - Group verbal and written instruction with the use of models to provide basic lung anatomy and physiology related to function, structure and complications of lung disease. Flowsheet Row Pulmonary Rehab from 06/24/2016 in [facility] Cardiac and Pulmonary Rehab  Date  05/06/16  Educator  SJ  Instruction Review Code  2- meets goals/outcomes      Heart Failure: - Group verbal and written instruction on the basics of heart failure: signs/symptoms, treatments, explanation of ejection fraction, enlarged heart and cardiomyopathy.   Sleep Apnea: - Individual verbal and written instruction to review Obstructive Sleep Apnea. Review of risk factors, methods for diagnosing and types of masks and machines for OSA.   Anxiety: - Provides group, verbal and written instruction on the correlation between heart/lung disease and anxiety, treatment options, and management of anxiety. Flowsheet Row Pulmonary Rehab from 06/24/2016 in [facility] Cardiac and Pulmonary Rehab  Date  06/01/16  Educator  [facility]  Instruction Review Code  2- Meets goals/outcomes      Relaxation: - Provides group, verbal and written instruction about the benefits of relaxation for patients with heart/lung disease. Also provides patients with examples of relaxation techniques.   Knowledge Questionnaire Score:     Knowledge Questionnaire Score - 04/26/16 1013      Knowledge Questionnaire Score   Pre Score 3/10       Core Components/Risk Factors/Patient Goals at Admission:     Personal Goals and Risk Factors at Admission - 04/25/16 1456      Core Components/Risk Factors/Patient Goals on Admission    Weight Management Weight Gain;Yes   Intervention Weight Management: Develop a combined nutrition and exercise program designed to reach  desired caloric intake, while maintaining appropriate intake of nutrient and fiber, sodium and fats, and appropriate energy expenditure required for the weight goal.   Admit Weight 109 lb (49.4 kg)   Goal Weight: Short Term 115 lb (52.2 kg)   Goal Weight: Long Term 125 lb (56.7 kg)   Expected Outcomes Short Term: Continue to assess and modify interventions until short term weight is achieved;Long Term: Adherence to nutrition and physical activity/exercise program aimed toward attainment of established weight goal;Weight Gain: Understanding of general recommendations for a high calorie, high protein meal plan that promotes weight gain by distributing calorie intake throughout the day with the consumption for 4-5 meals, snacks, and/or supplements   Sedentary Yes   Intervention Provide advice, education, support and counseling about physical  activity/exercise needs.;Develop an individualized exercise prescription for aerobic and resistive training based on initial evaluation findings, risk stratification, comorbidities and participant's personal goals.   Expected Outcomes Achievement of increased cardiorespiratory fitness and enhanced flexibility, muscular endurance and strength shown through measurements of functional capacity and personal statement of participant.   Increase Strength and Stamina Yes   Intervention Provide advice, education, support and counseling about physical activity/exercise needs.;Develop an individualized exercise prescription for aerobic and resistive training based on initial evaluation findings, risk stratification, comorbidities and participant's personal goals.   Expected Outcomes Achievement of increased cardiorespiratory fitness and enhanced flexibility, muscular endurance and strength shown through measurements of functional capacity and personal statement of participant.   Improve shortness of breath with ADL's Yes   Intervention Provide education, individualized exercise  plan and daily activity instruction to help decrease symptoms of SOB with activities of daily living.   Expected Outcomes Short Term: Achieves a reduction of symptoms when performing activities of daily living.   Develop more efficient breathing techniques such as purse lipped breathing and diaphragmatic breathing; and practicing self-pacing with activity Yes   Intervention Provide education, demonstration and support about specific breathing techniuqes utilized for more efficient breathing. Include techniques such as pursed lipped breathing, diaphragmatic breathing and self-pacing activity.   Expected Outcomes Short Term: Participant will be able to demonstrate and use breathing techniques as needed throughout daily activities.   Increase knowledge of respiratory medications and ability to use respiratory devices properly  Yes   Intervention Provide education and demonstration as needed of appropriate use of medications, inhalers, and oxygen therapy.   Expected Outcomes Short Term: Achieves understanding of medications use. Understands that oxygen is a medication prescribed by physician. Demonstrates appropriate use of inhaler and oxygen therapy.      Core Components/Risk Factors/Patient Goals Review:      Goals and Risk Factor Review    Row Name 05/02/16 1210 05/16/16 1000 05/16/16 1552 05/18/16 1248 06/06/16 1542     Core Components/Risk Factors/Patient Goals Review   Personal Goals Review Develop more efficient breathing techniques such as purse lipped breathing and diaphragmatic breathing and practicing self-pacing with activity. Develop more efficient breathing techniques such as purse lipped breathing and diaphragmatic breathing and practicing self-pacing with activity. Weight Management/Obesity Improve shortness of breath with ADL's;Sedentary Sedentary;Improve shortness of breath with ADL's;Develop more efficient breathing techniques such as purse lipped breathing and diaphragmatic  breathing and practicing self-pacing with activity.   Review Discussed and explained pursed lip breathing. Patient demonstrated understanding of breathing techniques.  Ms Bonsignore uses PLB with her exercise goals and with activities at home. She has good technique and requires no queing. Ms Ebersole felt very positive about her weight gain today. She is up 2lbs. Having meet with the dietitian, she is drinking a protein drink called Primar which has 60grams of protein and tastes good to her . Ms Slaydon reports being able to climbsteps better since beginning LW. Ms Eisinger increased her Mid49md by 1983f Minimal Importance Difference for COPD is [id number] states she has increased her energy level and activity at home. She is using PLB with her exercise and activity at home.   Expected Outcomes Patient will independently use pursed lip breathing techniques during exercise to help tolerate SOB.  Continue using PLB for management of her shortness of breath with activity. Continue working on gaining weight - her goals is a weight of 120lbs. Ms Grimme will see continued improvement with ADLS as she continues to progress with exercise  -  [place] Name 06/13/16 1152 06/15/16 1231           Core Components/Risk Factors/Patient Goals Review   Personal Goals Review Increase Strength and Stamina Improve shortness of breath with ADL's;Increase knowledge of respiratory medications and ability to use respiratory devices properly.      Review Coralyn Mark Reported that she was not exercising at home but has been consistantly attending class. She feels much stronger and said she has noticed great improvements in strength and stamina.  Ms Tumminello arrived to [facility] short of breath. Her breath sound were expiratory breath sound and decreased. She was given an SVN with albuterol. She was educated on her nebulizer - explained reservoir tubing, cleaning,. She did take the nebulizer home. Her BBS improved and her shortness of breath improved  as well. Ms Rodier was able to complete her treadmill goal.          Expected Outcomes Coralyn Mark will maintain consistant attendance in class and use breathing techniques to manage SOB in order to be able to continue progression with her workloads.   -         Core Components/Risk Factors/Patient Goals at Discharge (Final Review):      Goals and Risk Factor Review - 06/15/16 1231      Core Components/Risk Factors/Patient Goals Review   Personal Goals Review Improve shortness of breath with ADL's;Increase knowledge of respiratory medications and ability to use respiratory devices properly.   Review Ms Pickard arrived to [facility] short of breath. Her breath sound were expiratory breath sound and decreased. She was given an SVN with albuterol. She was educated on her nebulizer - explained reservoir tubing, cleaning,. She did take the nebulizer home. Her BBS improved and her shortness of breath improved as well. Ms Nery was able to complete her treadmill goal.          ITP Comments:     ITP Comments    Row Name 04/25/16 1439 06/22/16 1201         ITP Comments Initial ITP completed today.   Face to face meeting with Dr Elta Guadeloupe MIller/Program Medical Director completed today         Comments: 14 Day Review

## 2016-06-29 DIAGNOSIS — J449 Chronic obstructive pulmonary disease, unspecified: Secondary | ICD-10-CM | POA: Diagnosis not present

## 2016-06-29 NOTE — Progress Notes (Signed)
Daily Session Note  Patient Details  Name: Breanna Christian MRN: [id number] Date of Birth: 1941/06/20 Referring Provider:   Flowsheet Row Pulmonary Rehab from 05/30/2016 in [facility] Cardiac and Pulmonary Rehab  Referring Provider  Clayborn Bigness MD      Encounter Date: 06/29/2016  Check In:     Session Check In - 06/29/16 1236      Check-In   Location [facility]-Cardiac & Pulmonary Rehab   Staff Present Heath Lark, RN, BSN, CCRP;Laureen Owens Shark, BS, RRT, Respiratory Dareen Piano, BA, ACSM CEP, Exercise Physiologist   Supervising physician immediately available to respond to emergencies [facility] immediately available ER MD   Physician(s) Marcelene Butte and Jimmye Norman   Medication changes reported     No   Fall or balance concerns reported    No   Warm-up and Cool-down Performed on first and last piece of equipment   Resistance Training Performed Yes   VAD Patient? No     Pain Assessment   Currently in Pain? No/denies   Multiple Pain Sites No         Goals Met:  Proper associated with RPD/PD & O2 Sat Independence with exercise equipment Exercise tolerated well Strength training completed today  Goals Unmet:  Not Applicable  Comments: [name].  Will continue to monitor for progression.    Dr. Emily Filbert is Medical Director for [place] and [facility].

## 2016-07-01 ENCOUNTER — Encounter: Payer: Medicare Other | Admitting: *Deleted

## 2016-07-01 DIAGNOSIS — J449 Chronic obstructive pulmonary disease, unspecified: Secondary | ICD-10-CM | POA: Diagnosis not present

## 2016-07-01 NOTE — Progress Notes (Signed)
Daily Session Note  Patient Details  Name: Breanna Christian MRN: [id number] Date of Birth: Oct 29, 1941 Referring Provider:   Flowsheet Row Pulmonary Rehab from 05/30/2016 in [facility] Cardiac and Pulmonary Rehab  Referring Provider  Clayborn Bigness MD      Encounter Date: 07/01/2016  Check In:     Session Check In - 07/01/16 1111      Check-In   Location [facility]-Cardiac & Pulmonary Rehab   Staff Present Heath Lark, RN, BSN, CCRP;Laureen Owens Shark, BS, RRT, Respiratory Therapist;Marlise Fahr, RN, BSN   Physician(s) Dr. Jacqualine Code and Dr. Corky Downs   Medication changes reported     No   Fall or balance concerns reported    No   Warm-up and Cool-down Performed on first and last piece of equipment   Resistance Training Performed Yes   VAD Patient? No     Pain Assessment   Currently in Pain? No/denies         Goals Met:  Proper associated with RPD/PD & O2 Sat Exercise tolerated well  Goals Unmet:  Not Applicable  Comments:     Dr. Emily Filbert is Medical Director for [place] and [facility].

## 2016-07-04 ENCOUNTER — Encounter: Payer: Medicare Other | Admitting: *Deleted

## 2016-07-04 DIAGNOSIS — J449 Chronic obstructive pulmonary disease, unspecified: Secondary | ICD-10-CM

## 2016-07-04 NOTE — Progress Notes (Signed)
Daily Session Note  Patient Details  Name: Breanna Christian MRN: [id number] Date of Birth: 05/08/1941 Referring Provider:   Flowsheet Row Pulmonary Rehab from 05/30/2016 in [facility] Cardiac and Pulmonary Rehab  Referring Provider  Khan, Fozia MD      Encounter Date: 07/04/2016  Check In:     Session Check In - 07/04/16 1019      Check-In   Location [facility]-Cardiac & Pulmonary Rehab   Staff Present Laureen Brown, BS, RRT, Respiratory Therapist;Kelly Hayes, BS, ACSM CEP, Exercise Physiologist;Rebecca Sickles, DPT, CEEA   Supervising physician immediately available to respond to emergencies [facility] immediately available ER MD   Physician(s) Dr. Schevitz and Dr. Quigley   Medication changes reported     No   Fall or balance concerns reported    No   Warm-up and Cool-down Performed on first and last piece of equipment   Resistance Training Performed Yes   VAD Patient? No     Pain Assessment   Currently in Pain? No/denies   Multiple Pain Sites No         Goals Met:  Proper associated with RPD/PD & O2 Sat Independence with exercise equipment Exercise tolerated well Strength training completed today  Goals Unmet:  Not Applicable  Comments: [name].  Will continue to monitor for progression.    Dr. Mark Miller is Medical Director for [facility] and [facility]. 

## 2016-07-06 ENCOUNTER — Encounter: Payer: Medicare Other | Admitting: *Deleted

## 2016-07-06 DIAGNOSIS — J449 Chronic obstructive pulmonary disease, unspecified: Secondary | ICD-10-CM

## 2016-07-06 NOTE — Progress Notes (Signed)
Daily Session Note  Patient Details  Name: Breanna Christian MRN: [id number] Date of Birth: 19-May-1941 Referring Provider:   Flowsheet Row Pulmonary Rehab from 05/30/2016 in [facility] Cardiac and Pulmonary Rehab  Referring Provider  Clayborn Bigness MD      Encounter Date: 07/06/2016  Check In:     Session Check In - 07/06/16 1018      Check-In   Location [facility]-Cardiac & Pulmonary Rehab   Staff Present Alberteen Sam, MA, ACSM RCEP, Exercise Physiologist;Amanda Oletta Darter, BA, ACSM CEP, Exercise Physiologist;Laureen Owens Shark, BS, RRT, Respiratory Therapist   Supervising physician immediately available to respond to emergencies [facility] immediately available ER MD   Physician(s) Drs. Edd Fabian and McShane   Medication changes reported     No   Fall or balance concerns reported    No   Warm-up and Cool-down Performed as group-led Location manager Performed Yes   VAD Patient? No     Pain Assessment   Currently in Pain? No/denies   Multiple Pain Sites No           Exercise Prescription Changes - 07/05/16 1500      Exercise Review   Progression Yes     Response to Exercise   Blood Pressure (Admit) 100/60   Blood Pressure (Exercise) 120/68   Blood Pressure (Exit) 98/60   Heart Rate (Admit) 78 bpm   Heart Rate (Exercise) 118 bpm   Heart Rate (Exit) 87 bpm   Oxygen Saturation (Admit) 97 %   Oxygen Saturation (Exercise) 98 %   Oxygen Saturation (Exit) 98 %   Rating of Perceived Exertion (Exercise) 13   Perceived Dyspnea (Exercise) 3   Symptoms none   Comments Home Exercise Given 06/27/16   Duration Progress to 45 minutes of aerobic exercise without signs/symptoms of physical distress   Intensity THRR unchanged     Progression   Progression Continue to progress workloads to maintain intensity without signs/symptoms of physical distress.   Average METs 3.5     Resistance Training   Training Prescription Yes   Weight 3 lbs   Reps 10-15     Interval Training   Interval Training No     Treadmill   MPH 3   Grade 1   Minutes 15     NuStep   Level 3   Watts --  2.8 METs   Minutes 15     Biostep-RELP   Level 4   Watts --  4.0 METs   Minutes 15     Home Exercise Plan   Plans to continue exercise at Home  Walking at [place] 2 additional days to program exercise sessions.      Goals Met:  Proper associated with RPD/PD & O2 Sat Independence with exercise equipment Using PLB without cueing & demonstrates good technique Exercise tolerated well Strength training completed today  Goals Unmet:  Not Applicable  Comments: [name].  Will continue to monitor for progression.    Dr. Emily Filbert is Medical Director for [place] and [facility].

## 2016-07-08 DIAGNOSIS — J449 Chronic obstructive pulmonary disease, unspecified: Secondary | ICD-10-CM | POA: Diagnosis not present

## 2016-07-08 NOTE — Progress Notes (Signed)
Daily Session Note  Patient Details  Name: Breanna Christian MRN: [id number] Date of Birth: 06/22/1941 Referring Provider:   Flowsheet Row Pulmonary Rehab from 05/30/2016 in [facility] Cardiac and Pulmonary Rehab  Referring Provider  Clayborn Bigness MD      Encounter Date: 07/08/2016  Check In:     Session Check In - 07/08/16 1039      Check-In   Location [facility]-Cardiac & Pulmonary Rehab   Staff Present Gerlene Burdock, RN, BSN;Stacey Blanch Media, RRT, RCP, Respiratory Dareen Piano, BA, ACSM CEP, Exercise Physiologist   Supervising physician immediately available to respond to emergencies [facility] immediately available ER MD   Physician(s) Alfred Levins and Jimmye Norman   Medication changes reported     No   Fall or balance concerns reported    No   Warm-up and Cool-down Performed on first and last piece of equipment   Resistance Training Performed Yes   VAD Patient? No     Pain Assessment   Currently in Pain? No/denies   Multiple Pain Sites No         Goals Met:  Proper associated with RPD/PD & O2 Sat Independence with exercise equipment Exercise tolerated well Strength training completed today  Goals Unmet:  Not Applicable  Comments: [name].  Will continue to monitor for progression.    Dr. Emily Filbert is Medical Director for [name] and [facility].

## 2016-07-11 ENCOUNTER — Encounter: Payer: Medicare Other | Admitting: *Deleted

## 2016-07-11 DIAGNOSIS — J449 Chronic obstructive pulmonary disease, unspecified: Secondary | ICD-10-CM | POA: Diagnosis not present

## 2016-07-11 NOTE — Progress Notes (Signed)
Daily Session Note  Patient Details  Name: Breanna Christian MRN: [id number] Date of Birth: 15-Jul-1941 Referring Provider:   Flowsheet Row Pulmonary Rehab from 05/30/2016 in [facility] Cardiac and Pulmonary Rehab  Referring Provider  Breanna Bigness MD      Encounter Date: 07/11/2016  Check In:     Session Check In - 07/11/16 1007      Check-In   Location [facility]-Cardiac & Pulmonary Rehab   Staff Present Carson Myrtle, BS, RRT, Respiratory Therapist;Afnan Cadiente Amedeo Plenty, BS, ACSM CEP, Exercise Physiologist;Jessica Luan Pulling, MA, ACSM RCEP, Exercise Physiologist   Supervising physician immediately available to respond to emergencies [facility] immediately available ER MD   Physician(s) Alfred Levins and Jimmye Norman    Medication changes reported     No   Fall or balance concerns reported    No   Warm-up and Cool-down Performed on first and last piece of equipment   Resistance Training Performed Yes   VAD Patient? No     Pain Assessment   Currently in Pain? No/denies   Multiple Pain Sites No           Exercise Prescription Changes - 07/11/16 1000      Exercise Review   Progression Yes     Response to Exercise   Symptoms none   Comments Home Exercise Given 06/27/16   Duration Progress to 45 minutes of aerobic exercise without signs/symptoms of physical distress   Intensity THRR unchanged     Progression   Progression Continue to progress workloads to maintain intensity without signs/symptoms of physical distress.   Average METs 3.5     Resistance Training   Training Prescription Yes   Weight 3 lbs   Reps 10-15     Interval Training   Interval Training No     Treadmill   MPH 3   Grade 1   Minutes 15     NuStep   Level 3   Watts --  2.8 METs   Minutes 15     Biostep-RELP   Level 4   Watts --  4.0 METs   Minutes 15     Home Exercise Plan   Plans to continue exercise at [employer] (comment)  looking into different community facilities    Frequency Add 2 additional days to  program exercise sessions.      Goals Met:  Proper associated with RPD/PD & O2 Sat Independence with exercise equipment Exercise tolerated well Personal goals reviewed Strength training completed today  Goals Unmet:  Not Applicable  Comments:      [place] Name 04/25/16 1542 06/06/16 1059 07/11/16 1027     6 Minute Walk   Phase Initial Mid Program Discharge   Distance 1160 feet 1350 feet 1442 feet   Distance % Change 0 % 16 % 24 %   Walk Time 6 minutes 6 minutes 6 minutes   # of Rest Breaks 0 0 0   MPH 2.2 2.56 2.73   METS 2.68 2.96 3.09   RPE '12 13 15   ' Perceived Dyspnea  '2 3 4   ' VO2 Peak  - 10.36 10.83   Symptoms No No No   Resting HR 114 bpm 80 bpm 98 bpm   Resting BP 104/62 116/70 124/60   Max Ex. HR 140 bpm 125 bpm 135 bpm   Max Ex. BP 130/62 160/80 140/70   2 Minute Post BP  - 130/60 120/64     Interval HR   Baseline HR  -  80 98   1 Minute HR  - 114 100   2 Minute HR  - 119 112   3 Minute HR  - 120 133   4 Minute HR  - 123 135   5 Minute HR  - 124 135   6 Minute HR  - 125 120   2 Minute Post HR  - 92 103   Interval Heart Rate?  - Yes Yes     Interval Oxygen   Interval Oxygen?  - Yes Yes   Baseline Oxygen Saturation %  - 99 % 100 %   Baseline Liters of Oxygen  - 0 L 0 L   1 Minute Oxygen Saturation %  - 95 % 97 %   1 Minute Liters of Oxygen  - 0 L 0 L   2 Minute Oxygen Saturation %  - 99 % 98 %   2 Minute Liters of Oxygen  - 0 L 0 L   3 Minute Oxygen Saturation %  - 100 % 98 %   3 Minute Liters of Oxygen  - 0 L 0 L   4 Minute Oxygen Saturation %  - 99 % 96 %   4 Minute Liters of Oxygen  - 0 L 0 L   5 Minute Oxygen Saturation %  - 99 % 96 %   5 Minute Liters of Oxygen  - 0 L 0 L   6 Minute Oxygen Saturation %  - 99 % 96 %   6 Minute Liters of Oxygen  - 0 L 0 L   2 Minute Post Oxygen Saturation %  - 98 % 93 %   2 Minute Post Liters of Oxygen  - 0 L 0 L        Dr. Emily Filbert is Medical Director for [name] and [facility].

## 2016-07-13 DIAGNOSIS — J449 Chronic obstructive pulmonary disease, unspecified: Secondary | ICD-10-CM | POA: Diagnosis not present

## 2016-07-13 NOTE — Progress Notes (Signed)
Daily Session Note  Patient Details  Name: Breanna Christian MRN: [id number] Date of Birth: Nov 07, 1941 Referring Provider:   Flowsheet Row Pulmonary Rehab from 05/30/2016 in [facility] Cardiac and Pulmonary Rehab  Referring Provider  Clayborn Bigness MD      Encounter Date: 07/13/2016  Check In:     Session Check In - 07/13/16 1257      Check-In   Location [facility]-Cardiac & Pulmonary Rehab   Staff Present Nyoka Cowden, RN, BSN, [place], BS, RRT, Respiratory Dareen Piano, BA, ACSM CEP, Exercise Physiologist   Supervising physician immediately available to respond to emergencies [facility] immediately available ER MD   Physician(s) Alfred Levins and Jimmye Norman   Medication changes reported     No   Fall or balance concerns reported    No   Warm-up and Cool-down Performed as group-led instruction   Resistance Training Performed Yes   VAD Patient? No     Pain Assessment   Currently in Pain? No/denies   Multiple Pain Sites No         Goals Met:  Proper associated with RPD/PD & O2 Sat Independence with exercise equipment Exercise tolerated well Strength training completed today  Goals Unmet:  Not Applicable  Comments: [name].  Will continue to monitor for progression.    Dr. Emily Filbert is Medical Director for [place] and [facility].

## 2016-07-15 ENCOUNTER — Encounter: Payer: Medicare Other | Admitting: *Deleted

## 2016-07-15 DIAGNOSIS — J449 Chronic obstructive pulmonary disease, unspecified: Secondary | ICD-10-CM | POA: Diagnosis not present

## 2016-07-15 NOTE — Progress Notes (Signed)
Daily Session Note  Patient Details  Name: Breanna Christian MRN: [id number] Date of Birth: [birth year] Referring Provider:   Flowsheet Row Pulmonary Rehab from 05/30/2016 in [facility]  Referring Provider  Clayborn Bigness MD      Encounter Date: 07/15/2016  Check In:     Session Check In - 07/15/16 1152      Check-In   Staff Present Heath Lark, RN, BSN, CCRP;Mary Kellie Shropshire, RN, BSN, Cathlean Cower, RRT, RCP, Respiratory Therapist   Supervising physician immediately available to respond to emergencies [facility] immediately available ER MD   Physician(s) Drs: Clearnce Hasten and Archie Balboa   Medication changes reported     No   Fall or balance concerns reported    No   Warm-up and Cool-down Performed as group-led instruction   Resistance Training Performed Yes   VAD Patient? No     Pain Assessment   Currently in Pain? No/denies         Goals Met:  Proper associated with RPD/PD & O2 Sat Independence with exercise equipment Exercise tolerated well Strength training completed today  Goals Unmet:  Not Applicable  Comments: Doing well with exercise prescription progression.    Dr. Emily Filbert is Medical Director for [facility] and [facility].

## 2016-07-18 ENCOUNTER — Encounter: Payer: Self-pay | Admitting: Respiratory Therapy

## 2016-07-18 ENCOUNTER — Encounter: Payer: Medicare Other | Admitting: *Deleted

## 2016-07-18 DIAGNOSIS — J449 Chronic obstructive pulmonary disease, unspecified: Secondary | ICD-10-CM

## 2016-07-18 NOTE — Progress Notes (Signed)
Pulmonary Individual Treatment Plan  Patient Details  Name: Breanna Christian MRN: [id number] Date of Birth: December 17, 1940 Referring Provider:   Flowsheet Row Pulmonary Rehab from 05/30/2016 in [facility] Cardiac and Pulmonary Rehab  Referring Provider  Clayborn Bigness MD      Initial Encounter Date:  Flowsheet Row Pulmonary Rehab from 05/30/2016 in [facility] Cardiac and Pulmonary Rehab  Referring Provider  Clayborn Bigness MD      Visit Diagnosis: COPD, moderate ([place])  Patient's Home Medications on Admission:  Current Outpatient Prescriptions:    aspirin EC 81 MG tablet, Take by mouth., Disp: , Rfl:    benzonatate (TESSALON) 100 MG capsule, Take by mouth 3 (three) times daily as needed for cough., Disp: , Rfl:    Calcium Carbonate-Vitamin D (CALCIUM-VITAMIN D) 500-200 MG-UNIT tablet, Take by mouth., Disp: , Rfl:    Cyanocobalamin (RA VITAMIN B-12 TR) 1000 MCG TBCR, Take by mouth., Disp: , Rfl:    diltiazem (TIAZAC) 240 MG 24 hr capsule, , Disp: , Rfl:    Fluticasone-Salmeterol (ADVAIR) 250-50 MCG/DOSE AEPB, Inhale 1 puff into the lungs 2 (two) times daily., Disp: , Rfl:    folic acid (FOLVITE) 1 MG tablet, Take by mouth., Disp: , Rfl:    ipratropium-albuterol (DUONEB) 0.5-2.5 (3) MG/3ML SOLN, Take 3 mLs by nebulization., Disp: , Rfl:    Multiple Vitamin (MULTI-VITAMINS) TABS, Take by mouth. Reported on 04/25/2016, Disp: , Rfl:    predniSONE (DELTASONE) 10 MG tablet, Take 10 mg by mouth daily with breakfast. Reported on 04/25/2016, Disp: , Rfl:    roflumilast (DALIRESP) 500 MCG TABS tablet, Take 500 mcg by mouth daily., Disp: , Rfl:    tiotropium (SPIRIVA) 18 MCG inhalation capsule, Place 18 mcg into inhaler and inhale daily., Disp: , Rfl:    triamterene-hydrochlorothiazide (DYAZIDE) 37.5-25 MG capsule, Take by mouth., Disp: , Rfl:    Vitamin D, Ergocalciferol, (DRISDOL) 50000 units CAPS capsule, Take by mouth., Disp: , Rfl:   Past Medical History: Past Medical History:  Diagnosis Date    Non-Hodgkin lymphoma ([place]) 2003   Rheumatoid arthritis ([place])    Xerostomia     Tobacco Use: History  Smoking Status   Never Smoker  Smokeless Tobacco   Not on file    Labs: Recent Review Flowsheet Data    There is no flowsheet data to display.       ADL UCSD:     Pulmonary Assessment Scores    Row Name 04/26/16 1021 06/10/16 1251 07/18/16 1509     ADL UCSD   ADL Phase Entry Mid Exit   SOB Score total 48 73 74   Rest 1 0 2   Walk '2 2 3   ' Stairs '5 5 4   ' Bath 2 0 4   Dress '1 3 3   ' Shop '3 3 3      ' Pulmonary Function Assessment:     Pulmonary Function Assessment - 02/10/16 1017      Pulmonary Function Tests   RV% 154 %   DLCO% 47 %     Initial Spirometry Results   FVC% 55 %   FEV1% 53 %   FEV1/FVC Ratio 77     Post Bronchodilator Spirometry Results   FVC% 68 %   FEV1% 56 %   FEV1/FVC Ratio 66     Breath   Shortness of Breath Yes;Limiting activity      Exercise Target Goals:    Exercise Program Goal: Individual exercise prescription set with THRR, safety & activity barriers.  Participant demonstrates ability to understand and report RPE using BORG scale, to self-measure pulse accurately, and to acknowledge the importance of the exercise prescription.  Exercise Prescription Goal: Starting with aerobic activity 30 plus minutes a day, 3 days per week for initial exercise prescription. Provide home exercise prescription and guidelines that participant acknowledges understanding prior to discharge.  Activity Barriers & Risk Stratification:     Activity Barriers & Cardiac Risk Stratification - 04/25/16 1445      Activity Barriers & Cardiac Risk Stratification   Activity Barriers Deconditioning;Muscular Weakness;Shortness of Breath      6 Minute Walk:     6 Minute Walk    Row Name 04/25/16 1542 06/06/16 1059 07/11/16 1027     6 Minute Walk   Phase Initial Mid Program Discharge   Distance 1160 feet 1350 feet 1442 feet   Distance % Change  0 % 16 % 24 %   Walk Time 6 minutes 6 minutes 6 minutes   # of Rest Breaks 0 0 0   MPH 2.2 2.56 2.73   METS 2.68 2.96 3.09   RPE '12 13 15   ' Perceived Dyspnea  '2 3 4   ' VO2 Peak  -- 10.36 10.83   Symptoms No No No   Resting HR 114 bpm 80 bpm 98 bpm   Resting BP 104/62 116/70 124/60   Max Ex. HR 140 bpm 125 bpm 135 bpm   Max Ex. BP 130/62 160/80 140/70   2 Minute Post BP  -- 130/60 120/64     Interval HR   Baseline HR  -- 80 98   1 Minute HR  -- 114 100   2 Minute HR  -- 119 112   3 Minute HR  -- 120 133   4 Minute HR  -- 123 135   5 Minute HR  -- 124 135   6 Minute HR  -- 125 120   2 Minute Post HR  -- 92 103   Interval Heart Rate?  -- Yes Yes     Interval Oxygen   Interval Oxygen?  -- Yes Yes   Baseline Oxygen Saturation %  -- 99 % 100 %   Baseline Liters of Oxygen  -- 0 L 0 L   1 Minute Oxygen Saturation %  -- 95 % 97 %   1 Minute Liters of Oxygen  -- 0 L 0 L   2 Minute Oxygen Saturation %  -- 99 % 98 %   2 Minute Liters of Oxygen  -- 0 L 0 L   3 Minute Oxygen Saturation %  -- 100 % 98 %   3 Minute Liters of Oxygen  -- 0 L 0 L   4 Minute Oxygen Saturation %  -- 99 % 96 %   4 Minute Liters of Oxygen  -- 0 L 0 L   5 Minute Oxygen Saturation %  -- 99 % 96 %   5 Minute Liters of Oxygen  -- 0 L 0 L   6 Minute Oxygen Saturation %  -- 99 % 96 %   6 Minute Liters of Oxygen  -- 0 L 0 L   2 Minute Post Oxygen Saturation %  -- 98 % 93 %   2 Minute Post Liters of Oxygen  -- 0 L 0 L      Initial Exercise Prescription:     Initial Exercise Prescription - 05/30/16 1200      Date of Initial Exercise RX and  Referring Provider   Referring Provider Clayborn Bigness MD      Perform Capillary Blood Glucose checks as needed.  Exercise Prescription Changes:     Exercise Prescription Changes    Row Name 05/02/16 1200 05/06/16 1200 05/11/16 1400 05/25/16 1400 06/08/16 1400     Exercise Review   Progression  --  -- Yes Yes Yes     Response to Exercise   Blood Pressure (Admit)  124/70  -- 100/60 126/70 110/60   Blood Pressure (Exercise) 136/70  -- 146/68 124/64 146/62   Blood Pressure (Exit) 106/62  -- 108/60 90/60  rck after water 96/60 112/62   Heart Rate (Admit) 95 bpm  -- 60 bpm 108 bpm 116 bpm   Heart Rate (Exercise) 119 bpm  -- 126 bpm 122 bpm 130 bpm   Heart Rate (Exit) 94 bpm  -- 92 bpm 101 bpm 103 bpm   Oxygen Saturation (Admit) 97 %  -- 94 % 97 % 97 %   Oxygen Saturation (Exercise) 93 %  -- 97 % 98 % 98 %   Oxygen Saturation (Exit) 97 %  -- 97 % 97 % 97 %   Rating of Perceived Exertion (Exercise) 11  -- '12 13 15   ' Perceived Dyspnea (Exercise) 2  -- '3 3 3   ' Symptoms none  -- none none none   Comments  --  --  -- Home Exercise Given 05/18/16 Home Exercise Given 05/18/16   Duration Progress to 30 minutes of continuous aerobic without signs/symptoms of physical distress Progress to 30 minutes of continuous aerobic without signs/symptoms of physical distress Progress to 45 minutes of aerobic exercise without signs/symptoms of physical distress Progress to 45 minutes of aerobic exercise without signs/symptoms of physical distress Progress to 45 minutes of aerobic exercise without signs/symptoms of physical distress   Intensity THRR unchanged  135-149 THRR unchanged  135-149 THRR unchanged THRR unchanged THRR unchanged     Progression   Progression Continue progressive overload as per policy without signs/symptoms or physical distress. Continue progressive overload as per policy without signs/symptoms or physical distress. Continue to progress workloads to maintain intensity without signs/symptoms of physical distress. Continue to progress workloads to maintain intensity without signs/symptoms of physical distress. Continue to progress workloads to maintain intensity without signs/symptoms of physical distress.   Average METs  --  -- 2.5 2.34 2.47     Resistance Training   Training Prescription Yes Yes Yes Yes Yes   Weight '2 2 2 2 2   ' Reps 10-15 10-15 10-15 10-15  10-15     Interval Training   Interval Training  --  -- No No No     Treadmill   MPH '2 2 2 2 ' 2.5   Grade 0 0 0 0 0   Minutes '10 10 11 12 15     ' NuStep   Level 3  Omit today due to time. Was oriented to machine.  3  Omit today due to time. Was oriented to machine.  '3 3 3   ' Watts '30 30 30 25  ' 2.0 METs --  2.5 METs   Minutes '15 15 15 15 15     ' Biostep-RELP   Level '2 2 2 2 3   ' Watts '25 25 20 24  ' 2.5 METs --  2.0 METs   Minutes '10 12 13 15 15     ' Home Exercise Plan   Plans to continue exercise at  --  --  -- Home  Walking at  [place]  Walking at [facility]   Frequency  --  --  -- Add 1 additional day to program exercise sessions. Add 1 additional day to program exercise sessions.   Row Name 06/22/16 1500 06/27/16 1200 07/05/16 1500 07/11/16 1000 07/18/16 1500     Exercise Review   Progression Yes Yes Yes Yes Yes     Response to Exercise   Blood Pressure (Admit) 110/60  -- 100/60  -- 122/60   Blood Pressure (Exercise) 128/60  -- 120/68  -- 140/80   Blood Pressure (Exit) 134/70  -- 98/60  -- 102/60   Heart Rate (Admit) 96 bpm  -- 78 bpm  -- 89 bpm   Heart Rate (Exercise) 124 bpm  -- 118 bpm  -- 133 bpm   Heart Rate (Exit) 98 bpm  -- 87 bpm  -- 106 bpm   Oxygen Saturation (Admit) 96 %  -- 97 %  -- 98 %   Oxygen Saturation (Exercise) 97 %  -- 98 %  -- 100 %   Oxygen Saturation (Exit) 97 %  -- 98 %  -- 98 %   Rating of Perceived Exertion (Exercise) 13  -- 13  -- 13   Perceived Dyspnea (Exercise) 3  -- 3  -- 3   Symptoms none none none none none   Comments Home Exercise Given 05/18/16 Home Exercise Given 06/27/16 Home Exercise Given 06/27/16 Home Exercise Given 06/27/16 Home Exercise Given 06/27/16   Duration Progress to 45 minutes of aerobic exercise without signs/symptoms of physical distress Progress to 45 minutes of aerobic exercise without signs/symptoms of physical distress Progress to 45 minutes of aerobic exercise without signs/symptoms of physical distress Progress to 45  minutes of aerobic exercise without signs/symptoms of physical distress Progress to 50 minutes of aerobic without signs/symptoms of physical distress   Intensity THRR unchanged THRR unchanged THRR unchanged THRR unchanged THRR unchanged     Progression   Progression Continue to progress workloads to maintain intensity without signs/symptoms of physical distress. Continue to progress workloads to maintain intensity without signs/symptoms of physical distress. Continue to progress workloads to maintain intensity without signs/symptoms of physical distress. Continue to progress workloads to maintain intensity without signs/symptoms of physical distress. Continue to progress workloads to maintain intensity without signs/symptoms of physical distress.   Average METs 3.23 3.23 3.5 3.5 3.6     Resistance Training   Training Prescription Yes Yes Yes Yes Yes   Weight '2 2 3 ' lbs 3 lbs 3 lbs   Reps 10-15 10-15 10-15 10-15 10-15     Interval Training   Interval Training No No No No No     Treadmill   MPH '3 3 3 3 3   ' Grade 0.5 0.'5 1 1 ' 1.5   Minutes '15 15 15 15 15     ' NuStep   Level '3 3 3 3 4   ' Watts --  3.1 METs --  3.1 METs --  2.8 METs --  2.8 METs  --   Minutes '15 15 15 15 15     ' Biostep-RELP   Level '3 3 4 4 5   ' Watts --  3.0 METs --  3.0 METs --  4.0 METs --  4.0 METs  --   Minutes '15 15 15 15 15     ' Home Exercise Plan   Plans to continue exercise at Home  Walking at [facility]  Walking at [facility]  Walking at [facility] (comment)  looking into different  community facilities  [employer]   Frequency Add 1 additional day to program exercise sessions. Add 2 additional days to program exercise sessions. Add 2 additional days to program exercise sessions. Add 2 additional days to program exercise sessions. Add 2 additional days to program exercise sessions.      Exercise Comments:     Exercise Comments    Row Name 05/02/16 1158 05/11/16 1435 05/18/16 1250  05/25/16 1426 06/06/16 1103   Exercise Comments First full day of exercise!  Patient was oriented to gym and equipment including functions, settings, policies, and procedures.  Patient's individual exercise prescription and treatment plan were reviewed.  All starting workloads were established based on the results of the 6 minute walk test done at initial orientation visit.  The plan for exercise progression was also introduced and progression will be customized based on patient's performance and goals. Coralyn Mark is off to a good start with her exercise.  She has already increased her time to 15 min on both BioStep and NuStep.  She is increasing her time on the treadmill.  We will continue to work with Coralyn Mark on her progression. Coralyn Mark was sedentary before beginning due to caring for her hisband who passed way - she walks once per week at [place] outside sessions at [facility].  She wants to get back to gardening.  Her son is helping her decide where to exrecise when she graduates LW> Coralyn Mark continues to do well with exercise.  She is making good progress on exercise and is now up to 12 minutes on the treadmill.  We will continue to work with her on progression. 6 min walk evaluation done today. Results were reviewed with patient.    Row Name 06/08/16 1442 06/22/16 1459 06/27/16 1251 07/05/16 1526 07/11/16 1031   Exercise Comments Coralyn Mark is now up to 15 min continuous on the treadmill.  She continues to make good progress with exercise. Coralyn Mark is doing well with exercsie.  She continues to make improvements and is now up to 3.0 mph on treadmill for full 15 min. We will continue to monitor for progression. Reviewed home exercise with pt today.  Pt plans to walk at home for exercise.  Reviewed THR, pulse, RPE, sign and symptoms, and when to call 911 or MD.  Pt plans to purchase a pulse oximeter for home use to monitor heart rate and oxygen saturations at home.  Also discussed weather considerations and indoor options.  Pt voiced  understanding. Coralyn Mark is doing great with her exercise.  She is up to a 1% grade on her treadmill now.  We will continue to monitor her for progression. 6 min walk evaluation done today. See 6 min data sheet for detailed results.       Discharge Exercise Prescription (Final Exercise Prescription Changes):     Exercise Prescription Changes - 07/18/16 1500      Exercise Review   Progression Yes     Response to Exercise   Blood Pressure (Admit) 122/60   Blood Pressure (Exercise) 140/80   Blood Pressure (Exit) 102/60   Heart Rate (Admit) 89 bpm   Heart Rate (Exercise) 133 bpm   Heart Rate (Exit) 106 bpm   Oxygen Saturation (Admit) 98 %   Oxygen Saturation (Exercise) 100 %   Oxygen Saturation (Exit) 98 %   Rating of Perceived Exertion (Exercise) 13   Perceived Dyspnea (Exercise) 3   Symptoms none   Comments Home Exercise Given 06/27/16   Duration Progress to 50 minutes of  aerobic without signs/symptoms of physical distress   Intensity THRR unchanged     Progression   Progression Continue to progress workloads to maintain intensity without signs/symptoms of physical distress.   Average METs 3.6     Resistance Training   Training Prescription Yes   Weight 3 lbs   Reps 10-15     Interval Training   Interval Training No     Treadmill   MPH 3   Grade 1.5   Minutes 15     NuStep   Level 4   Minutes 15     Biostep-RELP   Level 5   Minutes 15     Home Exercise Plan   Plans to continue exercise at [place] 2 additional days to program exercise sessions.       Nutrition:  Target Goals: Understanding of nutrition guidelines, daily intake of sodium <1577m, cholesterol <2024m calories 30% from fat and 7% or less from saturated fats, daily to have 5 or more servings of fruits and vegetables.  Biometrics:     Pre Biometrics - 04/25/16 1554      Pre Biometrics   Height 5' 2.5" (1.588 m)   Weight 108 lb 11.2 oz (49.3 kg)   Waist Circumference 28.3  inches   Hip Circumference 34 inches   Waist to Hip Ratio 0.83 %   BMI (Calculated) 19.6       Nutrition Therapy Plan and Nutrition Goals:     Nutrition Therapy & Goals - 04/25/16 1440      Intervention Plan   Intervention Prescribe, educate and counsel regarding individualized specific dietary modifications aiming towards targeted core components such as weight, hypertension, lipid management, diabetes, heart failure and other comorbidities.   Expected Outcomes Short Term Goal: Understand basic principles of dietary content, such as calories, fat, sodium, cholesterol and nutrients.;Short Term Goal: A plan has been developed with personal nutrition goals set during dietitian appointment.;Long Term Goal: Adherence to prescribed nutrition plan.      Nutrition Discharge: Rate Your Plate Scores:   Psychosocial: Target Goals: Acknowledge presence or absence of depression, maximize coping skills, provide positive support system. Participant is able to verbalize types and ability to use techniques and skills needed for reducing stress and depression.  Initial Review & Psychosocial Screening:     Initial Psych Review & Screening - 04/25/16 1441      Initial Review   Current issues with Current Sleep Concerns     Family Dynamics   Good Support System? Yes  Children nearby   Concerns Recent loss of significant other   Comments Spouse died this past Ap2024-05-05Married since 1974           Barriers   Psychosocial barriers to participate in program There are no identifiable barriers or psychosocial needs.;The patient should benefit from training in stress management and relaxation.     Screening Interventions   Interventions Encouraged to exercise      Quality of Life Scores:     Quality of Life - 07/18/16 1510      Quality of Life Scores   Health/Function Pre 11.53 %   Health/Function Post 20 %   Health/Function % Change 73.46 %   Socioeconomic Pre 17.1 %   Socioeconomic Post  18.33 %   Socioeconomic % Change  7.19 %   Psych/Spiritual Pre 23.71 %   Psych/Spiritual Post 18.21 %   Psych/Spiritual % Change -23.2 %   Family Pre 25.5 %  Family Post 27.5 %   Family % Change 7.84 %   GLOBAL Pre 16.98 %   GLOBAL Post 20.23 %   GLOBAL % Change 19.14 %      PHQ-9: Recent Review Flowsheet Data    Depression screen [facility] 2/9 07/18/2016 04/25/2016   Decreased Interest 1 0   Down, Depressed, Hopeless 0 0   PHQ - 2 Score 1 0   Altered sleeping 0 3   Tired, decreased energy 2 3   Change in appetite 1 3   Feeling bad or failure about yourself  0 0   Trouble concentrating 0 0   Moving slowly or fidgety/restless 0 0   Suicidal thoughts 0 0   PHQ-9 Score 4 9   Difficult doing work/chores Not difficult at all Somewhat difficult      Psychosocial Evaluation and Intervention:     Psychosocial Evaluation - 05/04/16 1037      Psychosocial Evaluation & Interventions   Interventions Encouraged to exercise with the program and follow exercise prescription   Comments Counselor met with Ms. Vazques (Terri) today for initial psychosocial evaluation.  She is a 75 year old who is not quite sure what her "lung problem" is.  She has a very strong [place] accent and was difficult to understand at times.  Terri lives alone since her spouse of 42 years passed away several months ago.  Her (3) children live closeby and check in on her often.  Karna Christmas is also involved in the local church.  She states she does not sleep well nor does she have a good appetite and reports losing almost 50 pounds over the past two years while taking care of her spouse's health care needs.  She denies a history of depression or anxiety and admits to some sadness and general loss of interest in doing much since her spouse passed.  Terri reports "not much" stress in her life and has goals to just "feel better" while in this program.  Counselor will follow with Terri while here to determine if consistent exercise makes a  difference in her sleep patterns and appetite.  If not, counselor will recommend speaking with her doctor about a medication evaluation or speaking with a therapist to address her grief/loss impacting her mood.       Continued Psychosocial Services Needed Yes  Karna Christmas will benefit from the psychoeducational components of this program - particularly depression.  She may need to see a Dr. about a medication evaluation for her mood, or a therapist.      Psychosocial Re-Evaluation:     Psychosocial Re-Evaluation    [place] Name 06/08/16 1043 06/22/16 1202 07/04/16 1139         Psychosocial Re-Evaluation   Comments Counselor follow up with Ms. Dalgleish today reporting feeling "more alive" since coming into this program and working out consistently.  She is able to go up and down stairs with greater ease and states her mood has improved as well.  She continues to struggle some with sleep issues but states she is okay with taking naps.  counselor commended Ms. Estrada for her dedication and commitment to consistent exercise.   At face to face meeting today with Dr Emily Filbert, Coralyn Mark shared that she is feeling much stronger than her first day with the program. She stated that the exercise sessions in Pulmonary Rehab have helped her improve her strength and stamina.  She also stated that she had been depressed since her husbands death in Apr 08, 2023.  Since she started the Pulmonary Rehab program, her depression is improved and she is doing more than sitting at home on the couch.  Counselor follow up with Ms. F today stating she has seen progress in the areas of her mood; her energy; her appetite; her strength; and her legs are strong enough for her to be out working in her flower garden.  She continues to gain weight which was one of her goals as well.  Ms. Wanda Plump continues to struggle with sleeping more than 4 hours/night.  She reports medications have been prescribed but she has been hesitant to take them.  Counselor encouraged  trying maybe 1/2 dose if she is concerned about living alone and possible side effects.  She takes naps daily and does enjoy being awake at night to communicate or follow up on her family in [place], so there is a purpose for her sleep issues.  Counselor will continue to follow with Ms. Wanda Plump and commended her on her progress in so many areas.       Education: Education Goals: Education classes will be provided on a weekly basis, covering required topics. Participant will state understanding/return demonstration of topics presented.  Learning Barriers/Preferences:     Learning Barriers/Preferences - 04/25/16 1445      Learning Barriers/Preferences   Learning Barriers None   Learning Preferences None      Education Topics: Initial Evaluation Education: - Verbal, written and demonstration of respiratory meds, RPE/PD scales, oximetry and breathing techniques. Instruction on use of nebulizers and MDIs: cleaning and proper use, rinsing mouth with steroid doses and importance of monitoring MDI activations.   General Nutrition Guidelines/Fats and Fiber: -Group instruction provided by verbal, written material, models and posters to present the general guidelines for heart healthy nutrition. Gives an explanation and review of dietary fats and fiber. Flowsheet Row Pulmonary Rehab from 07/15/2016 in [facility] Cardiac and Pulmonary Rehab  Date  06/13/16  Educator  CR  Instruction Review Code  2- meets goals/outcomes      Controlling Sodium/Reading Food Labels: -Group verbal and written material supporting the discussion of sodium use in heart healthy nutrition. Review and explanation with models, verbal and written materials for utilization of the food label. Flowsheet Row Pulmonary Rehab from 07/15/2016 in [facility] Cardiac and Pulmonary Rehab  Date  05/09/16  Educator  CR  Instruction Review Code  2- meets goals/outcomes      Exercise Physiology & Risk Factors: - Group verbal and written instruction  with models to review the exercise physiology of the cardiovascular system and associated critical values. Details cardiovascular disease risk factors and the goals associated with each risk factor. Flowsheet Row Pulmonary Rehab from 07/15/2016 in [facility] Cardiac and Pulmonary Rehab  Date  07/13/16  Educator  AS  Instruction Review Code  2- meets goals/outcomes      Aerobic Exercise & Resistance Training: - Gives group verbal and written discussion on the health impact of inactivity. On the components of aerobic and resistive training programs and the benefits of this training and how to safely progress through these programs.   Flexibility, Balance, General Exercise Guidelines: - Provides group verbal and written instruction on the benefits of flexibility and balance training programs. Provides general exercise guidelines with specific guidelines to those with heart or lung disease. Demonstration and skill practice provided. Flowsheet Row Pulmonary Rehab from 07/15/2016 in [facility] Cardiac and Pulmonary Rehab  Date  06/15/16  Educator  AS  Instruction Review Code  2- meets goals/outcomes  Stress Management: - Provides group verbal and written instruction about the health risks of elevated stress, cause of high stress, and healthy ways to reduce stress.   Depression: - Provides group verbal and written instruction on the correlation between heart/lung disease and depressed mood, treatment options, and the stigmas associated with seeking treatment. Flowsheet Row Pulmonary Rehab from 07/15/2016 in [facility] Cardiac and Pulmonary Rehab  Date  05/04/16  Educator  [facility]  Instruction Review Code  2- meets goals/outcomes      Exercise & Equipment Safety: - Individual verbal instruction and demonstration of equipment use and safety with use of the equipment. Flowsheet Row Pulmonary Rehab from 07/15/2016 in [facility] Cardiac and Pulmonary Rehab  Date  04/25/16  Educator  SB  Instruction Review Code  2-  meets goals/outcomes      Infection Prevention: - Provides verbal and written material to individual with discussion of infection control including proper hand washing and proper equipment cleaning during exercise session. Flowsheet Row Pulmonary Rehab from 07/15/2016 in [facility] Cardiac and Pulmonary Rehab  Date  04/25/16  Educator  Sb  Instruction Review Code  2- meets goals/outcomes      Falls Prevention: - Provides verbal and written material to individual with discussion of falls prevention and safety. Flowsheet Row Pulmonary Rehab from 07/15/2016 in [facility] Cardiac and Pulmonary Rehab  Date  04/25/16  Educator  Sb  Instruction Review Code  2- meets goals/outcomes      Diabetes: - Individual verbal and written instruction to review signs/symptoms of diabetes, desired ranges of glucose level fasting, after meals and with exercise. Advice that pre and post exercise glucose checks will be done for 3 sessions at entry of program.   Chronic Lung Diseases: - Group verbal and written instruction to review new updates, new respiratory medications, new advancements in procedures and treatments. Provide informative websites and "800" numbers of self-education. Flowsheet Row Pulmonary Rehab from 07/15/2016 in [facility] Cardiac and Pulmonary Rehab  Date  06/06/16  Educator  LB  Instruction Review Code  2- meets goals/outcomes      Lung Procedures: - Group verbal and written instruction to describe testing methods done to diagnose lung disease. Review the outcome of test results. Describe the treatment choices: Pulmonary Function Tests, ABGs and oximetry. Flowsheet Row Pulmonary Rehab from 07/15/2016 in [facility] Cardiac and Pulmonary Rehab  Date  06/10/16  Educator  Frederich Cha, RT  Instruction Review Code  2- meets goals/outcomes      Energy Conservation: - Provide group verbal and written instruction for methods to conserve energy, plan and organize activities. Instruct on pacing techniques, use  of adaptive equipment and posture/positioning to relieve shortness of breath. Flowsheet Row Pulmonary Rehab from 07/15/2016 in [facility] Cardiac and Pulmonary Rehab  Date  06/08/16  Educator  JH/AS  Instruction Review Code  2- meets goals/outcomes      Triggers: - Group verbal and written instruction to review types of environmental controls: home humidity, furnaces, filters, dust mite/pet prevention, HEPA vacuums. To discuss weather changes, air quality and the benefits of nasal washing.   Exacerbations: - Group verbal and written instruction to provide: warning signs, infection symptoms, calling MD promptly, preventive modes, and value of vaccinations. Review: effective airway clearance, coughing and/or vibration techniques. Create an Sports administrator. Flowsheet Row Pulmonary Rehab from 07/15/2016 in [facility] Cardiac and Pulmonary Rehab  Date  05/16/16  Educator  LB  Instruction Review Code  2- meets goals/outcomes      Oxygen: - Individual and group verbal  and written instruction on oxygen therapy. Includes supplement oxygen, available portable oxygen systems, continuous and intermittent flow rates, oxygen safety, concentrators, and Medicare reimbursement for oxygen.   Respiratory Medications: - Group verbal and written instruction to review medications for lung disease. Drug class, frequency, complications, importance of spacers, rinsing mouth after steroid MDI's, and proper cleaning methods for nebulizers.   AED/CPR: - Group verbal and written instruction with the use of models to demonstrate the basic use of the AED with the basic ABC's of resuscitation. Flowsheet Row Pulmonary Rehab from 07/15/2016 in [facility] Cardiac and Pulmonary Rehab  Date  06/24/16  Educator  CE  Instruction Review Code  2- meets goals/outcomes      Breathing Retraining: - Provides individuals verbal and written instruction on purpose, frequency, and proper technique of diaphragmatic breathing and pursed-lipped  breathing. Applies individual practice skills.   Anatomy and Physiology of the Lungs: - Group verbal and written instruction with the use of models to provide basic lung anatomy and physiology related to function, structure and complications of lung disease. Flowsheet Row Pulmonary Rehab from 07/15/2016 in [facility] Cardiac and Pulmonary Rehab  Date  07/15/16  Educator  Gray  Instruction Review Code  2- meets goals/outcomes      Heart Failure: - Group verbal and written instruction on the basics of heart failure: signs/symptoms, treatments, explanation of ejection fraction, enlarged heart and cardiomyopathy. Flowsheet Row Pulmonary Rehab from 07/15/2016 in [facility] Cardiac and Pulmonary Rehab  Date  07/08/16 [facility] your numbers]  Educator  CE  Instruction Review Code  2- meets goals/outcomes      Sleep Apnea: - Individual verbal and written instruction to review Obstructive Sleep Apnea. Review of risk factors, methods for diagnosing and types of masks and machines for OSA.   Anxiety: - Provides group, verbal and written instruction on the correlation between heart/lung disease and anxiety, treatment options, and management of anxiety. Flowsheet Row Pulmonary Rehab from 07/15/2016 in [facility] Cardiac and Pulmonary Rehab  Date  06/01/16  Educator  [facility]  Instruction Review Code  2- Meets goals/outcomes      Relaxation: - Provides group, verbal and written instruction about the benefits of relaxation for patients with heart/lung disease. Also provides patients with examples of relaxation techniques. Flowsheet Row Pulmonary Rehab from 07/15/2016 in [facility] Cardiac and Pulmonary Rehab  Date  06/29/16  Educator  [facility]  Instruction Review Code  2- Meets goals/outcomes      Knowledge Questionnaire Score:     Knowledge Questionnaire Score - 07/18/16 1510      Knowledge Questionnaire Score   Post Score 10/10       Core Components/Risk Factors/Patient Goals at Admission:     Personal Goals and Risk  Factors at Admission - 04/25/16 1456      Core Components/Risk Factors/Patient Goals on Admission    Weight Management Weight Gain;Yes   Intervention Weight Management: Develop a combined nutrition and exercise program designed to reach desired caloric intake, while maintaining appropriate intake of nutrient and fiber, sodium and fats, and appropriate energy expenditure required for the weight goal.   Admit Weight 109 lb (49.4 kg)   Goal Weight: Short Term 115 lb (52.2 kg)   Goal Weight: Long Term 125 lb (56.7 kg)   Expected Outcomes Short Term: Continue to assess and modify interventions until short term weight is achieved;Long Term: Adherence to nutrition and physical activity/exercise program aimed toward attainment of established weight goal;Weight Gain: Understanding of general recommendations for a high calorie, high protein  meal plan that promotes weight gain by distributing calorie intake throughout the day with the consumption for 4-5 meals, snacks, and/or supplements   Sedentary Yes   Intervention Provide advice, education, support and counseling about physical activity/exercise needs.;Develop an individualized exercise prescription for aerobic and resistive training based on initial evaluation findings, risk stratification, comorbidities and participant's personal goals.   Expected Outcomes Achievement of increased cardiorespiratory fitness and enhanced flexibility, muscular endurance and strength shown through measurements of functional capacity and personal statement of participant.   Increase Strength and Stamina Yes   Intervention Provide advice, education, support and counseling about physical activity/exercise needs.;Develop an individualized exercise prescription for aerobic and resistive training based on initial evaluation findings, risk stratification, comorbidities and participant's personal goals.   Expected Outcomes Achievement of increased cardiorespiratory fitness and enhanced  flexibility, muscular endurance and strength shown through measurements of functional capacity and personal statement of participant.   Improve shortness of breath with ADL's Yes   Intervention Provide education, individualized exercise plan and daily activity instruction to help decrease symptoms of SOB with activities of daily living.   Expected Outcomes Short Term: Achieves a reduction of symptoms when performing activities of daily living.   Develop more efficient breathing techniques such as purse lipped breathing and diaphragmatic breathing; and practicing self-pacing with activity Yes   Intervention Provide education, demonstration and support about specific breathing techniuqes utilized for more efficient breathing. Include techniques such as pursed lipped breathing, diaphragmatic breathing and self-pacing activity.   Expected Outcomes Short Term: Participant will be able to demonstrate and use breathing techniques as needed throughout daily activities.   Increase knowledge of respiratory medications and ability to use respiratory devices properly  Yes   Intervention Provide education and demonstration as needed of appropriate use of medications, inhalers, and oxygen therapy.   Expected Outcomes Short Term: Achieves understanding of medications use. Understands that oxygen is a medication prescribed by physician. Demonstrates appropriate use of inhaler and oxygen therapy.      Core Components/Risk Factors/Patient Goals Review:      Goals and Risk Factor Review    Row Name 05/02/16 1210 05/16/16 1000 05/16/16 1552 05/18/16 1248 06/06/16 1542     Core Components/Risk Factors/Patient Goals Review   Personal Goals Review Develop more efficient breathing techniques such as purse lipped breathing and diaphragmatic breathing and practicing self-pacing with activity. Develop more efficient breathing techniques such as purse lipped breathing and diaphragmatic breathing and practicing self-pacing  with activity. Weight Management/Obesity Improve shortness of breath with ADL's;Sedentary Sedentary;Improve shortness of breath with ADL's;Develop more efficient breathing techniques such as purse lipped breathing and diaphragmatic breathing and practicing self-pacing with activity.   Review Discussed and explained pursed lip breathing. Patient demonstrated understanding of breathing techniques.  Ms Yanni uses PLB with her exercise goals and with activities at home. She has good technique and requires no queing. Ms Twedt felt very positive about her weight gain today. She is up 2lbs. Having meet with the dietitian, she is drinking a protein drink called Primar which has 60grams of protein and tastes good to her . Ms Diltz reports being able to climbsteps better since beginning LW. Ms Llamas increased her Mid57md by 192f Minimal Importance Difference for COPD is 98.25f62fShe states she has increased her energy level and activity at home. She is using PLB with her exercise and activity at home.   Expected Outcomes Patient will independently use pursed lip breathing techniques during exercise to help tolerate SOB.  Continue using PLB  for management of her shortness of breath with activity. Continue working on gaining weight - her goals is a weight of 120lbs. Ms Ammons will see continued improvement with ADLS as she continues to progress with exercise  --   Row Name 06/13/16 1152 06/15/16 1231 07/05/16 0727 07/18/16 1514 07/18/16 1523     Core Components/Risk Factors/Patient Goals Review   Personal Goals Review Increase Strength and Stamina Improve shortness of breath with ADL's;Increase knowledge of respiratory medications and ability to use respiratory devices properly. Sedentary;Increase Strength and Stamina;Improve shortness of breath with ADL's;Increase knowledge of respiratory medications and ability to use respiratory devices properly.;Develop more efficient breathing techniques such as purse lipped  breathing and diaphragmatic breathing and practicing self-pacing with activity. Sedentary;Increase Strength and Stamina;Improve shortness of breath with ADL's;Develop more efficient breathing techniques such as purse lipped breathing and diaphragmatic breathing and practicing self-pacing with activity.;Increase knowledge of respiratory medications and ability to use respiratory devices properly.  --   Review Coralyn Mark Reported that she was not exercising at home but has been consistantly attending class. She feels much stronger and said she has noticed great improvements in strength and stamina.  Ms Andre arrived to [facility] short of breath. Her breath sound were expiratory breath sound and decreased. She was given an SVN with albuterol. She was educated on her nebulizer - explained reservoir tubing, cleaning,. She did take the nebulizer home. Her BBS improved and her shortness of breath improved as well. Ms Schlender was able to complete her treadmill goal.     Ms Crusoe states she is doing so much better, both physically and mentally. She is now working in the garden with her flowers - pacing herself with PLB and using her inhalers as ordered for her shortness of breath. She is looking into [employer], since her graduation is soon. Ms Vierling will be graduating in 2 more sessions. She improved her post 80md by 2863f- Minimal Importance Difference for COPD is 98.3f51fShe is looking forward to continued exercise in [employer] is very active with her garden and flowers. She has a good understnading of her MDI's and shortness of breath - using PLB and pacing. She states she has improved her life tremendously after participating in [facility] [place] be graduating in 2 more sessions. She improved her post [id number] Importance Difference for COPD is 98.3ft. 79f is looking forward to continued exercise in [employer] very active with her garden and flowers. She has a good understanding of  her MDI's and shortness of breath - using PLB and pacing. She states she has improved her life tremendously after participating in [facility]pected Outcomes Terry Coralyn Markmaintain consistant attendance in class and use breathing techniques to manage SOB in order to be able to continue progression with her workloads.   --  -- Continue exercising and being active and continue managing her COPD with proper MDI techniques, PLB, early treatment of an exacerbation, and learning more about COPD through education material.  --      Core Components/Risk Factors/Patient Goals at Discharge (Final Review):      Goals and Risk Factor Review - 07/18/16 1523      Core Components/Risk Factors/Patient Goals Review   Review Ms Linenberger will be graduating in 2 more sessions. She improved her post 6mwd [id number] - 75fmal Importance Difference for COPD is 98.3ft. She56f looking forward to continued exercise in [employer] active  with her garden and flowers. She has a good understanding of her MDI's and shortness of breath - using PLB and pacing. She states she has improved her life tremendously after participating in [place].      ITP Comments:     ITP Comments    Row Name 04/25/16 1439 06/22/16 1201 07/18/16 1536       ITP Comments Initial ITP completed today.   Face to face meeting with Dr Elta Guadeloupe MIller/Program Medical Director completed today Face to face meeting with Dr Emily Filbert, Medical Director of [place], completed today before discharge.        Comments: Ms Letendre met face to face with Dr Emily Filbert, Medical Director of [place]

## 2016-07-18 NOTE — Progress Notes (Signed)
Daily Session Note  Patient Details  Name: Breanna Christian MRN: [id number] Date of Birth: [birth year] Referring Provider:   Flowsheet Row Pulmonary Rehab from 05/30/2016 in [facility] Cardiac and Pulmonary Rehab  Referring Provider  Clayborn Bigness MD      Encounter Date: 07/18/2016  Check In:     Session Check In - 07/18/16 1019      Check-In   Location [facility]-Cardiac & Pulmonary Rehab   Staff Present Alberteen Sam, MA, ACSM RCEP, Exercise Physiologist;Kelly Amedeo Plenty, BS, ACSM CEP, Exercise Physiologist;Laureen Owens Shark, BS, RRT, Respiratory Therapist   Supervising physician immediately available to respond to emergencies [facility] immediately available ER MD   Physician(s) Drs. Edd Fabian and Yao   Medication changes reported     No   Fall or balance concerns reported    No   Warm-up and Cool-down Performed as group-led Location manager Performed Yes   VAD Patient? No     Pain Assessment   Currently in Pain? No/denies   Multiple Pain Sites No         Goals Met:  Proper associated with RPD/PD & O2 Sat Independence with exercise equipment Using PLB without cueing & demonstrates good technique Exercise tolerated well Strength training completed today  Goals Unmet:  Not Applicable   Comments: [name].  Will continue to monitor for progression.    Dr. Emily Filbert is Medical Director for [place] and [facility] Pulmonary Rehabilitation.

## 2016-07-18 NOTE — Progress Notes (Signed)
Pulmonary Individual Treatment Plan  Patient Details  Name: Breanna Christian MRN: [id number] Date of Birth: 1941/06/25 Referring Provider:   Flowsheet Row Pulmonary Rehab from 05/30/2016 in [facility] Cardiac and Pulmonary Rehab  Referring Provider  Clayborn Bigness MD      Initial Encounter Date:  Flowsheet Row Pulmonary Rehab from 05/30/2016 in [facility] Cardiac and Pulmonary Rehab  Referring Provider  Clayborn Bigness MD      Visit Diagnosis: COPD, moderate ([place])  Patient's Home Medications on Admission:  Current Outpatient Prescriptions:    aspirin EC 81 MG tablet, Take by mouth., Disp: , Rfl:    benzonatate (TESSALON) 100 MG capsule, Take by mouth 3 (three) times daily as needed for cough., Disp: , Rfl:    Calcium Carbonate-Vitamin D (CALCIUM-VITAMIN D) 500-200 MG-UNIT tablet, Take by mouth., Disp: , Rfl:    Cyanocobalamin (RA VITAMIN B-12 TR) 1000 MCG TBCR, Take by mouth., Disp: , Rfl:    diltiazem (TIAZAC) 240 MG 24 hr capsule, , Disp: , Rfl:    Fluticasone-Salmeterol (ADVAIR) 250-50 MCG/DOSE AEPB, Inhale 1 puff into the lungs 2 (two) times daily., Disp: , Rfl:    folic acid (FOLVITE) 1 MG tablet, Take by mouth., Disp: , Rfl:    ipratropium-albuterol (DUONEB) 0.5-2.5 (3) MG/3ML SOLN, Take 3 mLs by nebulization., Disp: , Rfl:    Multiple Vitamin (MULTI-VITAMINS) TABS, Take by mouth. Reported on 04/25/2016, Disp: , Rfl:    predniSONE (DELTASONE) 10 MG tablet, Take 10 mg by mouth daily with breakfast. Reported on 04/25/2016, Disp: , Rfl:    roflumilast (DALIRESP) 500 MCG TABS tablet, Take 500 mcg by mouth daily., Disp: , Rfl:    tiotropium (SPIRIVA) 18 MCG inhalation capsule, Place 18 mcg into inhaler and inhale daily., Disp: , Rfl:    triamterene-hydrochlorothiazide (DYAZIDE) 37.5-25 MG capsule, Take by mouth., Disp: , Rfl:    Vitamin D, Ergocalciferol, (DRISDOL) 50000 units CAPS capsule, Take by mouth., Disp: , Rfl:   Past Medical History: Past Medical History:  Diagnosis Date    Non-Hodgkin lymphoma ([place]) 2003   Rheumatoid arthritis ([place])    Xerostomia     Tobacco Use: History  Smoking Status   Never Smoker  Smokeless Tobacco   Not on file    Labs: Recent Review Flowsheet Data    There is no flowsheet data to display.       ADL UCSD:     Pulmonary Assessment Scores    Row Name 04/26/16 1021 06/10/16 1251 07/18/16 1509     ADL UCSD   ADL Phase Entry Mid Exit   SOB Score total 48 73 74   Rest 1 0 2   Walk '2 2 3   ' Stairs '5 5 4   ' Bath 2 0 4   Dress '1 3 3   ' Shop '3 3 3      ' Pulmonary Function Assessment:     Pulmonary Function Assessment - 02/10/16 1017      Pulmonary Function Tests   RV% 154 %   DLCO% 47 %     Initial Spirometry Results   FVC% 55 %   FEV1% 53 %   FEV1/FVC Ratio 77     Post Bronchodilator Spirometry Results   FVC% 68 %   FEV1% 56 %   FEV1/FVC Ratio 66     Breath   Shortness of Breath Yes;Limiting activity      Exercise Target Goals:    Exercise Program Goal: Individual exercise prescription set with THRR, safety & activity barriers.  Participant demonstrates ability to understand and report RPE using BORG scale, to self-measure pulse accurately, and to acknowledge the importance of the exercise prescription.  Exercise Prescription Goal: Starting with aerobic activity 30 plus minutes a day, 3 days per week for initial exercise prescription. Provide home exercise prescription and guidelines that participant acknowledges understanding prior to discharge.  Activity Barriers & Risk Stratification:     Activity Barriers & Cardiac Risk Stratification - 04/25/16 1445      Activity Barriers & Cardiac Risk Stratification   Activity Barriers Deconditioning;Muscular Weakness;Shortness of Breath      6 Minute Walk:     6 Minute Walk    Row Name 04/25/16 1542 06/06/16 1059 07/11/16 1027     6 Minute Walk   Phase Initial Mid Program Discharge   Distance 1160 feet 1350 feet 1442 feet   Distance % Change  0 % 16 % 24 %   Walk Time 6 minutes 6 minutes 6 minutes   # of Rest Breaks 0 0 0   MPH 2.2 2.56 2.73   METS 2.68 2.96 3.09   RPE '12 13 15   ' Perceived Dyspnea  '2 3 4   ' VO2 Peak  -- 10.36 10.83   Symptoms No No No   Resting HR 114 bpm 80 bpm 98 bpm   Resting BP 104/62 116/70 124/60   Max Ex. HR 140 bpm 125 bpm 135 bpm   Max Ex. BP 130/62 160/80 140/70   2 Minute Post BP  -- 130/60 120/64     Interval HR   Baseline HR  -- 80 98   1 Minute HR  -- 114 100   2 Minute HR  -- 119 112   3 Minute HR  -- 120 133   4 Minute HR  -- 123 135   5 Minute HR  -- 124 135   6 Minute HR  -- 125 120   2 Minute Post HR  -- 92 103   Interval Heart Rate?  -- Yes Yes     Interval Oxygen   Interval Oxygen?  -- Yes Yes   Baseline Oxygen Saturation %  -- 99 % 100 %   Baseline Liters of Oxygen  -- 0 L 0 L   1 Minute Oxygen Saturation %  -- 95 % 97 %   1 Minute Liters of Oxygen  -- 0 L 0 L   2 Minute Oxygen Saturation %  -- 99 % 98 %   2 Minute Liters of Oxygen  -- 0 L 0 L   3 Minute Oxygen Saturation %  -- 100 % 98 %   3 Minute Liters of Oxygen  -- 0 L 0 L   4 Minute Oxygen Saturation %  -- 99 % 96 %   4 Minute Liters of Oxygen  -- 0 L 0 L   5 Minute Oxygen Saturation %  -- 99 % 96 %   5 Minute Liters of Oxygen  -- 0 L 0 L   6 Minute Oxygen Saturation %  -- 99 % 96 %   6 Minute Liters of Oxygen  -- 0 L 0 L   2 Minute Post Oxygen Saturation %  -- 98 % 93 %   2 Minute Post Liters of Oxygen  -- 0 L 0 L      Initial Exercise Prescription:     Initial Exercise Prescription - 05/30/16 1200      Date of Initial Exercise RX and  Referring Provider   Referring Provider Clayborn Bigness MD      Perform Capillary Blood Glucose checks as needed.  Exercise Prescription Changes:     Exercise Prescription Changes    Row Name 05/02/16 1200 05/06/16 1200 05/11/16 1400 05/25/16 1400 06/08/16 1400     Exercise Review   Progression  --  -- Yes Yes Yes     Response to Exercise   Blood Pressure (Admit)  124/70  -- 100/60 126/70 110/60   Blood Pressure (Exercise) 136/70  -- 146/68 124/64 146/62   Blood Pressure (Exit) 106/62  -- 108/60 90/60  rck after water 96/60 112/62   Heart Rate (Admit) 95 bpm  -- 60 bpm 108 bpm 116 bpm   Heart Rate (Exercise) 119 bpm  -- 126 bpm 122 bpm 130 bpm   Heart Rate (Exit) 94 bpm  -- 92 bpm 101 bpm 103 bpm   Oxygen Saturation (Admit) 97 %  -- 94 % 97 % 97 %   Oxygen Saturation (Exercise) 93 %  -- 97 % 98 % 98 %   Oxygen Saturation (Exit) 97 %  -- 97 % 97 % 97 %   Rating of Perceived Exertion (Exercise) 11  -- '12 13 15   ' Perceived Dyspnea (Exercise) 2  -- '3 3 3   ' Symptoms none  -- none none none   Comments  --  --  -- Home Exercise Given 05/18/16 Home Exercise Given 05/18/16   Duration Progress to 30 minutes of continuous aerobic without signs/symptoms of physical distress Progress to 30 minutes of continuous aerobic without signs/symptoms of physical distress Progress to 45 minutes of aerobic exercise without signs/symptoms of physical distress Progress to 45 minutes of aerobic exercise without signs/symptoms of physical distress Progress to 45 minutes of aerobic exercise without signs/symptoms of physical distress   Intensity THRR unchanged  135-149 THRR unchanged  135-149 THRR unchanged THRR unchanged THRR unchanged     Progression   Progression Continue progressive overload as per policy without signs/symptoms or physical distress. Continue progressive overload as per policy without signs/symptoms or physical distress. Continue to progress workloads to maintain intensity without signs/symptoms of physical distress. Continue to progress workloads to maintain intensity without signs/symptoms of physical distress. Continue to progress workloads to maintain intensity without signs/symptoms of physical distress.   Average METs  --  -- 2.5 2.34 2.47     Resistance Training   Training Prescription Yes Yes Yes Yes Yes   Weight '2 2 2 2 2   ' Reps 10-15 10-15 10-15 10-15  10-15     Interval Training   Interval Training  --  -- No No No     Treadmill   MPH '2 2 2 2 ' 2.5   Grade 0 0 0 0 0   Minutes '10 10 11 12 15     ' NuStep   Level 3  Omit today due to time. Was oriented to machine.  3  Omit today due to time. Was oriented to machine.  '3 3 3   ' Watts '30 30 30 25  ' 2.0 METs --  2.5 METs   Minutes '15 15 15 15 15     ' Biostep-RELP   Level '2 2 2 2 3   ' Watts '25 25 20 24  ' 2.5 METs --  2.0 METs   Minutes '10 12 13 15 15     ' Home Exercise Plan   Plans to continue exercise at  --  --  -- Home  Walking at  [place]  Walking at [facility]   Frequency  --  --  -- Add 1 additional day to program exercise sessions. Add 1 additional day to program exercise sessions.   Row Name 06/22/16 1500 06/27/16 1200 07/05/16 1500 07/11/16 1000 07/18/16 1500     Exercise Review   Progression Yes Yes Yes Yes Yes     Response to Exercise   Blood Pressure (Admit) 110/60  -- 100/60  -- 122/60   Blood Pressure (Exercise) 128/60  -- 120/68  -- 140/80   Blood Pressure (Exit) 134/70  -- 98/60  -- 102/60   Heart Rate (Admit) 96 bpm  -- 78 bpm  -- 89 bpm   Heart Rate (Exercise) 124 bpm  -- 118 bpm  -- 133 bpm   Heart Rate (Exit) 98 bpm  -- 87 bpm  -- 106 bpm   Oxygen Saturation (Admit) 96 %  -- 97 %  -- 98 %   Oxygen Saturation (Exercise) 97 %  -- 98 %  -- 100 %   Oxygen Saturation (Exit) 97 %  -- 98 %  -- 98 %   Rating of Perceived Exertion (Exercise) 13  -- 13  -- 13   Perceived Dyspnea (Exercise) 3  -- 3  -- 3   Symptoms none none none none none   Comments Home Exercise Given 05/18/16 Home Exercise Given 06/27/16 Home Exercise Given 06/27/16 Home Exercise Given 06/27/16 Home Exercise Given 06/27/16   Duration Progress to 45 minutes of aerobic exercise without signs/symptoms of physical distress Progress to 45 minutes of aerobic exercise without signs/symptoms of physical distress Progress to 45 minutes of aerobic exercise without signs/symptoms of physical distress Progress to 45  minutes of aerobic exercise without signs/symptoms of physical distress Progress to 50 minutes of aerobic without signs/symptoms of physical distress   Intensity THRR unchanged THRR unchanged THRR unchanged THRR unchanged THRR unchanged     Progression   Progression Continue to progress workloads to maintain intensity without signs/symptoms of physical distress. Continue to progress workloads to maintain intensity without signs/symptoms of physical distress. Continue to progress workloads to maintain intensity without signs/symptoms of physical distress. Continue to progress workloads to maintain intensity without signs/symptoms of physical distress. Continue to progress workloads to maintain intensity without signs/symptoms of physical distress.   Average METs 3.23 3.23 3.5 3.5 3.6     Resistance Training   Training Prescription Yes Yes Yes Yes Yes   Weight '2 2 3 ' lbs 3 lbs 3 lbs   Reps 10-15 10-15 10-15 10-15 10-15     Interval Training   Interval Training No No No No No     Treadmill   MPH '3 3 3 3 3   ' Grade 0.5 0.'5 1 1 ' 1.5   Minutes '15 15 15 15 15     ' NuStep   Level '3 3 3 3 4   ' Watts --  3.1 METs --  3.1 METs --  2.8 METs --  2.8 METs  --   Minutes '15 15 15 15 15     ' Biostep-RELP   Level '3 3 4 4 5   ' Watts --  3.0 METs --  3.0 METs --  4.0 METs --  4.0 METs  --   Minutes '15 15 15 15 15     ' Home Exercise Plan   Plans to continue exercise at Home  Walking at [facility]  Walking at [facility]  Walking at [facility] (comment)  looking into different  community facilities  [employer]   Frequency Add 1 additional day to program exercise sessions. Add 2 additional days to program exercise sessions. Add 2 additional days to program exercise sessions. Add 2 additional days to program exercise sessions. Add 2 additional days to program exercise sessions.      Exercise Comments:     Exercise Comments    Row Name 05/02/16 1158 05/11/16 1435 05/18/16 1250  05/25/16 1426 06/06/16 1103   Exercise Comments First full day of exercise!  Patient was oriented to gym and equipment including functions, settings, policies, and procedures.  Patient's individual exercise prescription and treatment plan were reviewed.  All starting workloads were established based on the results of the 6 minute walk test done at initial orientation visit.  The plan for exercise progression was also introduced and progression will be customized based on patient's performance and goals. Breanna Christian is off to a good start with her exercise.  She has already increased her time to 15 min on both BioStep and NuStep.  She is increasing her time on the treadmill.  We will continue to work with Breanna Christian on her progression. Breanna Christian was sedentary before beginning due to caring for her hisband who passed way - she walks once per week at [place] outside sessions at [facility].  She wants to get back to gardening.  Her son is helping her decide where to exrecise when she graduates LW> Breanna Christian continues to do well with exercise.  She is making good progress on exercise and is now up to 12 minutes on the treadmill.  We will continue to work with her on progression. 6 min walk evaluation done today. Results were reviewed with patient.    Row Name 06/08/16 1442 06/22/16 1459 06/27/16 1251 07/05/16 1526 07/11/16 1031   Exercise Comments Breanna Christian is now up to 15 min continuous on the treadmill.  She continues to make good progress with exercise. Breanna Christian is doing well with exercsie.  She continues to make improvements and is now up to 3.0 mph on treadmill for full 15 min. We will continue to monitor for progression. Reviewed home exercise with pt today.  Pt plans to walk at home for exercise.  Reviewed THR, pulse, RPE, sign and symptoms, and when to call 911 or MD.  Pt plans to purchase a pulse oximeter for home use to monitor heart rate and oxygen saturations at home.  Also discussed weather considerations and indoor options.  Pt voiced  understanding. Breanna Christian is doing great with her exercise.  She is up to a 1% grade on her treadmill now.  We will continue to monitor her for progression. 6 min walk evaluation done today. See 6 min data sheet for detailed results.       Discharge Exercise Prescription (Final Exercise Prescription Changes):     Exercise Prescription Changes - 07/18/16 1500      Exercise Review   Progression Yes     Response to Exercise   Blood Pressure (Admit) 122/60   Blood Pressure (Exercise) 140/80   Blood Pressure (Exit) 102/60   Heart Rate (Admit) 89 bpm   Heart Rate (Exercise) 133 bpm   Heart Rate (Exit) 106 bpm   Oxygen Saturation (Admit) 98 %   Oxygen Saturation (Exercise) 100 %   Oxygen Saturation (Exit) 98 %   Rating of Perceived Exertion (Exercise) 13   Perceived Dyspnea (Exercise) 3   Symptoms none   Comments Home Exercise Given 06/27/16   Duration Progress to 50 minutes of  aerobic without signs/symptoms of physical distress   Intensity THRR unchanged     Progression   Progression Continue to progress workloads to maintain intensity without signs/symptoms of physical distress.   Average METs 3.6     Resistance Training   Training Prescription Yes   Weight 3 lbs   Reps 10-15     Interval Training   Interval Training No     Treadmill   MPH 3   Grade 1.5   Minutes 15     NuStep   Level 4   Minutes 15     Biostep-RELP   Level 5   Minutes 15     Home Exercise Plan   Plans to continue exercise at [place] 2 additional days to program exercise sessions.       Nutrition:  Target Goals: Understanding of nutrition guidelines, daily intake of sodium <1544m, cholesterol <2034m calories 30% from fat and 7% or less from saturated fats, daily to have 5 or more servings of fruits and vegetables.  Biometrics:     Pre Biometrics - 04/25/16 1554      Pre Biometrics   Height 5' 2.5" (1.588 m)   Weight 108 lb 11.2 oz (49.3 kg)   Waist Circumference 28.3  inches   Hip Circumference 34 inches   Waist to Hip Ratio 0.83 %   BMI (Calculated) 19.6       Nutrition Therapy Plan and Nutrition Goals:     Nutrition Therapy & Goals - 04/25/16 1440      Intervention Plan   Intervention Prescribe, educate and counsel regarding individualized specific dietary modifications aiming towards targeted core components such as weight, hypertension, lipid management, diabetes, heart failure and other comorbidities.   Expected Outcomes Short Term Goal: Understand basic principles of dietary content, such as calories, fat, sodium, cholesterol and nutrients.;Short Term Goal: A plan has been developed with personal nutrition goals set during dietitian appointment.;Long Term Goal: Adherence to prescribed nutrition plan.      Nutrition Discharge: Rate Your Plate Scores:   Psychosocial: Target Goals: Acknowledge presence or absence of depression, maximize coping skills, provide positive support system. Participant is able to verbalize types and ability to use techniques and skills needed for reducing stress and depression.  Initial Review & Psychosocial Screening:     Initial Psych Review & Screening - 04/25/16 1441      Initial Review   Current issues with Current Sleep Concerns     Family Dynamics   Good Support System? Yes  Children nearby   Concerns Recent loss of significant other   Comments Spouse died this past Ap05-09-2024Married since 1974           Barriers   Psychosocial barriers to participate in program There are no identifiable barriers or psychosocial needs.;The patient should benefit from training in stress management and relaxation.     Screening Interventions   Interventions Encouraged to exercise      Quality of Life Scores:     Quality of Life - 07/18/16 1510      Quality of Life Scores   Health/Function Pre 11.53 %   Health/Function Post 20 %   Health/Function % Change 73.46 %   Socioeconomic Pre 17.1 %   Socioeconomic Post  18.33 %   Socioeconomic % Change  7.19 %   Psych/Spiritual Pre 23.71 %   Psych/Spiritual Post 18.21 %   Psych/Spiritual % Change -23.2 %   Family Pre 25.5 %  Family Post 27.5 %   Family % Change 7.84 %   GLOBAL Pre 16.98 %   GLOBAL Post 20.23 %   GLOBAL % Change 19.14 %      PHQ-9: Recent Review Flowsheet Data    Depression screen [facility] 2/9 07/18/2016 04/25/2016   Decreased Interest 1 0   Down, Depressed, Hopeless 0 0   PHQ - 2 Score 1 0   Altered sleeping 0 3   Tired, decreased energy 2 3   Change in appetite 1 3   Feeling bad or failure about yourself  0 0   Trouble concentrating 0 0   Moving slowly or fidgety/restless 0 0   Suicidal thoughts 0 0   PHQ-9 Score 4 9   Difficult doing work/chores Not difficult at all Somewhat difficult      Psychosocial Evaluation and Intervention:     Psychosocial Evaluation - 05/04/16 1037      Psychosocial Evaluation & Interventions   Interventions Encouraged to exercise with the program and follow exercise prescription   Comments Counselor met with Breanna. Raine (Breanna Christian) today for initial psychosocial evaluation.  She is a 75 year old who is not quite sure what her "lung problem" is.  She has a very strong [place] accent and was difficult to understand at times.  Breanna Christian lives alone since her spouse of 42 years passed away several months ago.  Her (3) children live closeby and check in on her often.  Breanna Christian is also involved in the local church.  She states she does not sleep well nor does she have a good appetite and reports losing almost 50 pounds over the past two years while taking care of her spouse's health care needs.  She denies a history of depression or anxiety and admits to some sadness and general loss of interest in doing much since her spouse passed.  Breanna Christian reports "not much" stress in her life and has goals to just "feel better" while in this program.  Counselor will follow with Breanna Christian while here to determine if consistent exercise makes a  difference in her sleep patterns and appetite.  If not, counselor will recommend speaking with her doctor about a medication evaluation or speaking with a therapist to address her grief/loss impacting her mood.       Continued Psychosocial Services Needed Yes  Breanna Christian will benefit from the psychoeducational components of this program - particularly depression.  She may need to see a Dr. about a medication evaluation for her mood, or a therapist.      Psychosocial Re-Evaluation:     Psychosocial Re-Evaluation    [place] Name 06/08/16 1043 06/22/16 1202 07/04/16 1139         Psychosocial Re-Evaluation   Comments Counselor follow up with Breanna Christian today reporting feeling "more alive" since coming into this program and working out consistently.  She is able to go up and down stairs with greater ease and states her mood has improved as well.  She continues to struggle some with sleep issues but states she is okay with taking naps.  counselor commended Breanna Christian for her dedication and commitment to consistent exercise.   At face to face meeting today with Dr Emily Filbert, Breanna Christian shared that she is feeling much stronger than her first day with the program. She stated that the exercise sessions in Pulmonary Rehab have helped her improve her strength and stamina.  She also stated that she had been depressed since her husbands death in 2023/04/04.  Since she started the Pulmonary Rehab program, her depression is improved and she is doing more than sitting at home on the couch.  Counselor follow up with Breanna Christian today stating she has seen progress in the areas of her mood; her energy; her appetite; her strength; and her legs are strong enough for her to be out working in her flower garden.  She continues to gain weight which was one of her goals as well.  Breanna Christian continues to struggle with sleeping more than 4 hours/night.  She reports medications have been prescribed but she has been hesitant to take them.  Counselor encouraged  trying maybe 1/2 dose if she is concerned about living alone and possible side effects.  She takes naps daily and does enjoy being awake at night to communicate or follow up on her family in [place], so there is a purpose for her sleep issues.  Counselor will continue to follow with Breanna Christian and commended her on her progress in so many areas.       Education: Education Goals: Education classes will be provided on a weekly basis, covering required topics. Participant will state understanding/return demonstration of topics presented.  Learning Barriers/Preferences:     Learning Barriers/Preferences - 04/25/16 1445      Learning Barriers/Preferences   Learning Barriers None   Learning Preferences None      Education Topics: Initial Evaluation Education: - Verbal, written and demonstration of respiratory meds, RPE/PD scales, oximetry and breathing techniques. Instruction on use of nebulizers and MDIs: cleaning and proper use, rinsing mouth with steroid doses and importance of monitoring MDI activations.   General Nutrition Guidelines/Fats and Fiber: -Group instruction provided by verbal, written material, models and posters to present the general guidelines for heart healthy nutrition. Gives an explanation and review of dietary fats and fiber. Flowsheet Row Pulmonary Rehab from 07/15/2016 in [facility] Cardiac and Pulmonary Rehab  Date  06/13/16  Educator  CR  Instruction Review Code  2- meets goals/outcomes      Controlling Sodium/Reading Food Labels: -Group verbal and written material supporting the discussion of sodium use in heart healthy nutrition. Review and explanation with models, verbal and written materials for utilization of the food label. Flowsheet Row Pulmonary Rehab from 07/15/2016 in [facility] Cardiac and Pulmonary Rehab  Date  05/09/16  Educator  CR  Instruction Review Code  2- meets goals/outcomes      Exercise Physiology & Risk Factors: - Group verbal and written instruction  with models to review the exercise physiology of the cardiovascular system and associated critical values. Details cardiovascular disease risk factors and the goals associated with each risk factor. Flowsheet Row Pulmonary Rehab from 07/15/2016 in [facility] Cardiac and Pulmonary Rehab  Date  07/13/16  Educator  AS  Instruction Review Code  2- meets goals/outcomes      Aerobic Exercise & Resistance Training: - Gives group verbal and written discussion on the health impact of inactivity. On the components of aerobic and resistive training programs and the benefits of this training and how to safely progress through these programs.   Flexibility, Balance, General Exercise Guidelines: - Provides group verbal and written instruction on the benefits of flexibility and balance training programs. Provides general exercise guidelines with specific guidelines to those with heart or lung disease. Demonstration and skill practice provided. Flowsheet Row Pulmonary Rehab from 07/15/2016 in [facility] Cardiac and Pulmonary Rehab  Date  06/15/16  Educator  AS  Instruction Review Code  2- meets goals/outcomes  Stress Management: - Provides group verbal and written instruction about the health risks of elevated stress, cause of high stress, and healthy ways to reduce stress.   Depression: - Provides group verbal and written instruction on the correlation between heart/lung disease and depressed mood, treatment options, and the stigmas associated with seeking treatment. Flowsheet Row Pulmonary Rehab from 07/15/2016 in [facility] Cardiac and Pulmonary Rehab  Date  05/04/16  Educator  [facility]  Instruction Review Code  2- meets goals/outcomes      Exercise & Equipment Safety: - Individual verbal instruction and demonstration of equipment use and safety with use of the equipment. Flowsheet Row Pulmonary Rehab from 07/15/2016 in [facility] Cardiac and Pulmonary Rehab  Date  04/25/16  Educator  SB  Instruction Review Code  2-  meets goals/outcomes      Infection Prevention: - Provides verbal and written material to individual with discussion of infection control including proper hand washing and proper equipment cleaning during exercise session. Flowsheet Row Pulmonary Rehab from 07/15/2016 in [facility] Cardiac and Pulmonary Rehab  Date  04/25/16  Educator  Sb  Instruction Review Code  2- meets goals/outcomes      Falls Prevention: - Provides verbal and written material to individual with discussion of falls prevention and safety. Flowsheet Row Pulmonary Rehab from 07/15/2016 in [facility] Cardiac and Pulmonary Rehab  Date  04/25/16  Educator  Sb  Instruction Review Code  2- meets goals/outcomes      Diabetes: - Individual verbal and written instruction to review signs/symptoms of diabetes, desired ranges of glucose level fasting, after meals and with exercise. Advice that pre and post exercise glucose checks will be done for 3 sessions at entry of program.   Chronic Lung Diseases: - Group verbal and written instruction to review new updates, new respiratory medications, new advancements in procedures and treatments. Provide informative websites and "800" numbers of self-education. Flowsheet Row Pulmonary Rehab from 07/15/2016 in [facility] Cardiac and Pulmonary Rehab  Date  06/06/16  Educator  LB  Instruction Review Code  2- meets goals/outcomes      Lung Procedures: - Group verbal and written instruction to describe testing methods done to diagnose lung disease. Review the outcome of test results. Describe the treatment choices: Pulmonary Function Tests, ABGs and oximetry. Flowsheet Row Pulmonary Rehab from 07/15/2016 in [facility] Cardiac and Pulmonary Rehab  Date  06/10/16  Educator  Frederich Cha, RT  Instruction Review Code  2- meets goals/outcomes      Energy Conservation: - Provide group verbal and written instruction for methods to conserve energy, plan and organize activities. Instruct on pacing techniques, use  of adaptive equipment and posture/positioning to relieve shortness of breath. Flowsheet Row Pulmonary Rehab from 07/15/2016 in [facility] Cardiac and Pulmonary Rehab  Date  06/08/16  Educator  JH/AS  Instruction Review Code  2- meets goals/outcomes      Triggers: - Group verbal and written instruction to review types of environmental controls: home humidity, furnaces, filters, dust mite/pet prevention, HEPA vacuums. To discuss weather changes, air quality and the benefits of nasal washing.   Exacerbations: - Group verbal and written instruction to provide: warning signs, infection symptoms, calling MD promptly, preventive modes, and value of vaccinations. Review: effective airway clearance, coughing and/or vibration techniques. Create an Sports administrator. Flowsheet Row Pulmonary Rehab from 07/15/2016 in [facility] Cardiac and Pulmonary Rehab  Date  05/16/16  Educator  LB  Instruction Review Code  2- meets goals/outcomes      Oxygen: - Individual and group verbal  and written instruction on oxygen therapy. Includes supplement oxygen, available portable oxygen systems, continuous and intermittent flow rates, oxygen safety, concentrators, and Medicare reimbursement for oxygen.   Respiratory Medications: - Group verbal and written instruction to review medications for lung disease. Drug class, frequency, complications, importance of spacers, rinsing mouth after steroid MDI's, and proper cleaning methods for nebulizers.   AED/CPR: - Group verbal and written instruction with the use of models to demonstrate the basic use of the AED with the basic ABC's of resuscitation. Flowsheet Row Pulmonary Rehab from 07/15/2016 in [facility] Cardiac and Pulmonary Rehab  Date  06/24/16  Educator  CE  Instruction Review Code  2- meets goals/outcomes      Breathing Retraining: - Provides individuals verbal and written instruction on purpose, frequency, and proper technique of diaphragmatic breathing and pursed-lipped  breathing. Applies individual practice skills.   Anatomy and Physiology of the Lungs: - Group verbal and written instruction with the use of models to provide basic lung anatomy and physiology related to function, structure and complications of lung disease. Flowsheet Row Pulmonary Rehab from 07/15/2016 in [facility] Cardiac and Pulmonary Rehab  Date  07/15/16  Educator  [place]  Instruction Review Code  2- meets goals/outcomes      Heart Failure: - Group verbal and written instruction on the basics of heart failure: signs/symptoms, treatments, explanation of ejection fraction, enlarged heart and cardiomyopathy. Flowsheet Row Pulmonary Rehab from 07/15/2016 in [facility] Cardiac and Pulmonary Rehab  Date  07/08/16 [facility] your numbers]  Educator  CE  Instruction Review Code  2- meets goals/outcomes      Sleep Apnea: - Individual verbal and written instruction to review Obstructive Sleep Apnea. Review of risk factors, methods for diagnosing and types of masks and machines for OSA.   Anxiety: - Provides group, verbal and written instruction on the correlation between heart/lung disease and anxiety, treatment options, and management of anxiety. Flowsheet Row Pulmonary Rehab from 07/15/2016 in [facility] Cardiac and Pulmonary Rehab  Date  06/01/16  Educator  [facility]  Instruction Review Code  2- Meets goals/outcomes      Relaxation: - Provides group, verbal and written instruction about the benefits of relaxation for patients with heart/lung disease. Also provides patients with examples of relaxation techniques. Flowsheet Row Pulmonary Rehab from 07/15/2016 in [facility] Cardiac and Pulmonary Rehab  Date  06/29/16  Educator  [facility]  Instruction Review Code  2- Meets goals/outcomes      Knowledge Questionnaire Score:     Knowledge Questionnaire Score - 07/18/16 1510      Knowledge Questionnaire Score   Post Score 10/10       Core Components/Risk Factors/Patient Goals at Admission:     Personal Goals and Risk  Factors at Admission - 04/25/16 1456      Core Components/Risk Factors/Patient Goals on Admission    Weight Management Weight Gain;Yes   Intervention Weight Management: Develop a combined nutrition and exercise program designed to reach desired caloric intake, while maintaining appropriate intake of nutrient and fiber, sodium and fats, and appropriate energy expenditure required for the weight goal.   Admit Weight 109 lb (49.4 kg)   Goal Weight: Short Term 115 lb (52.2 kg)   Goal Weight: Long Term 125 lb (56.7 kg)   Expected Outcomes Short Term: Continue to assess and modify interventions until short term weight is achieved;Long Term: Adherence to nutrition and physical activity/exercise program aimed toward attainment of established weight goal;Weight Gain: Understanding of general recommendations for a high calorie, high protein  meal plan that promotes weight gain by distributing calorie intake throughout the day with the consumption for 4-5 meals, snacks, and/or supplements   Sedentary Yes   Intervention Provide advice, education, support and counseling about physical activity/exercise needs.;Develop an individualized exercise prescription for aerobic and resistive training based on initial evaluation findings, risk stratification, comorbidities and participant's personal goals.   Expected Outcomes Achievement of increased cardiorespiratory fitness and enhanced flexibility, muscular endurance and strength shown through measurements of functional capacity and personal statement of participant.   Increase Strength and Stamina Yes   Intervention Provide advice, education, support and counseling about physical activity/exercise needs.;Develop an individualized exercise prescription for aerobic and resistive training based on initial evaluation findings, risk stratification, comorbidities and participant's personal goals.   Expected Outcomes Achievement of increased cardiorespiratory fitness and enhanced  flexibility, muscular endurance and strength shown through measurements of functional capacity and personal statement of participant.   Improve shortness of breath with ADL's Yes   Intervention Provide education, individualized exercise plan and daily activity instruction to help decrease symptoms of SOB with activities of daily living.   Expected Outcomes Short Term: Achieves a reduction of symptoms when performing activities of daily living.   Develop more efficient breathing techniques such as purse lipped breathing and diaphragmatic breathing; and practicing self-pacing with activity Yes   Intervention Provide education, demonstration and support about specific breathing techniuqes utilized for more efficient breathing. Include techniques such as pursed lipped breathing, diaphragmatic breathing and self-pacing activity.   Expected Outcomes Short Term: Participant will be able to demonstrate and use breathing techniques as needed throughout daily activities.   Increase knowledge of respiratory medications and ability to use respiratory devices properly  Yes   Intervention Provide education and demonstration as needed of appropriate use of medications, inhalers, and oxygen therapy.   Expected Outcomes Short Term: Achieves understanding of medications use. Understands that oxygen is a medication prescribed by physician. Demonstrates appropriate use of inhaler and oxygen therapy.      Core Components/Risk Factors/Patient Goals Review:      Goals and Risk Factor Review    Row Name 05/02/16 1210 05/16/16 1000 05/16/16 1552 05/18/16 1248 06/06/16 1542     Core Components/Risk Factors/Patient Goals Review   Personal Goals Review Develop more efficient breathing techniques such as purse lipped breathing and diaphragmatic breathing and practicing self-pacing with activity. Develop more efficient breathing techniques such as purse lipped breathing and diaphragmatic breathing and practicing self-pacing  with activity. Weight Management/Obesity Improve shortness of breath with ADL's;Sedentary Sedentary;Improve shortness of breath with ADL's;Develop more efficient breathing techniques such as purse lipped breathing and diaphragmatic breathing and practicing self-pacing with activity.   Review Discussed and explained pursed lip breathing. Patient demonstrated understanding of breathing techniques.  Breanna Christian uses PLB with her exercise goals and with activities at home. She has good technique and requires no queing. Breanna Christian felt very positive about her weight gain today. She is up 2lbs. Having meet with the dietitian, she is drinking a protein drink called Primar which has 60grams of protein and tastes good to her . Breanna Christian reports being able to climbsteps better since beginning LW. Breanna Christian increased her Mid3md by 1929f Minimal Importance Difference for COPD is 98.62f39fShe states she has increased her energy level and activity at home. She is using PLB with her exercise and activity at home.   Expected Outcomes Patient will independently use pursed lip breathing techniques during exercise to help tolerate SOB.  Continue using PLB  for management of her shortness of breath with activity. Continue working on gaining weight - her goals is a weight of 120lbs. Breanna Christian will see continued improvement with ADLS as she continues to progress with exercise  --   Row Name 06/13/16 1152 06/15/16 1231 07/05/16 0727 07/18/16 1514 07/18/16 1523     Core Components/Risk Factors/Patient Goals Review   Personal Goals Review Increase Strength and Stamina Improve shortness of breath with ADL's;Increase knowledge of respiratory medications and ability to use respiratory devices properly. Sedentary;Increase Strength and Stamina;Improve shortness of breath with ADL's;Increase knowledge of respiratory medications and ability to use respiratory devices properly.;Develop more efficient breathing techniques such as purse lipped  breathing and diaphragmatic breathing and practicing self-pacing with activity. Sedentary;Increase Strength and Stamina;Improve shortness of breath with ADL's;Develop more efficient breathing techniques such as purse lipped breathing and diaphragmatic breathing and practicing self-pacing with activity.;Increase knowledge of respiratory medications and ability to use respiratory devices properly.  --   Review Breanna Christian Reported that she was not exercising at home but has been consistantly attending class. She feels much stronger and said she has noticed great improvements in strength and stamina.  Breanna Christian arrived to [facility] short of breath. Her breath sound were expiratory breath sound and decreased. She was given an SVN with albuterol. She was educated on her nebulizer - explained reservoir tubing, cleaning,. She did take the nebulizer home. Her BBS improved and her shortness of breath improved as well. Breanna Christian was able to complete her treadmill goal.     Breanna Christian states she is doing so much better, both physically and mentally. She is now working in the garden with her flowers - pacing herself with PLB and using her inhalers as ordered for her shortness of breath. She is looking into [employer], since her graduation is soon. Breanna Christian will be graduating in 2 more sessions. She improved her post 90md by 2861f- Minimal Importance Difference for COPD is 98.31f95fShe is looking forward to continued exercise in [employer] is very active with her garden and flowers. She has a good understnading of her MDI's and shortness of breath - using PLB and pacing. She states she has improved her life tremendously after participating in [facility] be [facility] in 2 more sessions. She improved her post [id number] Importance Difference for COPD is 98.31ft. 31f is looking forward to continued exercise in [employer] very active with her garden and flowers. She has a good understanding of  her MDI's and shortness of breath - using PLB and pacing. She states she has improved her life tremendously after participating in [facility] Breanna Markmaintain consistant attendance in class and use breathing techniques to manage SOB in order to be able to continue progression with her workloads.   --  -- Continue exercising and being active and continue managing her COPD with proper MDI techniques, PLB, early treatment of an exacerbation, and learning more about COPD through education material.  --      Core Components/Risk Factors/Patient Goals at Discharge (Final Review):      Goals and Risk Factor Review - 07/18/16 1523      Core Components/Risk Factors/Patient Goals Review   Review Breanna Christian will be graduating in 2 more sessions. She improved her post 6mwd [id number] - 81fmal Importance Difference for COPD is 98.31ft. She42f looking forward to continued exercise in [employer] active  with her garden and flowers. She has a good understanding of her MDI's and shortness of breath - using PLB and pacing. She states she has improved her life tremendously after participating in [place].      ITP Comments:     ITP Comments    Row Name 04/25/16 1439 06/22/16 1201 07/18/16 1536       ITP Comments Initial ITP completed today.   Face to face meeting with Dr Elta Guadeloupe MIller/Program Medical Director completed today Face to face meeting with Dr Emily Filbert, Medical Director of [place], completed today before discharge.        Comments: Breanna Christian will be graduating after 2 additional sessions completed. She plans to join [employer] to continue her exercise.

## 2016-07-20 ENCOUNTER — Encounter: Payer: Medicare Other | Admitting: *Deleted

## 2016-07-20 DIAGNOSIS — J449 Chronic obstructive pulmonary disease, unspecified: Secondary | ICD-10-CM

## 2016-07-20 NOTE — Patient Instructions (Signed)
Discharge Instructions  Patient Details  Name: Breanna Christian MRN: [id number] Date of Birth: [birth year] Referring Provider:  Lavera Guise, MD   Number of Visits:  36  Reason for Discharge:  Patient reached a stable level of exercise. Patient independent in their exercise.  Smoking History:  History  Smoking Status  . Never Smoker  Smokeless Tobacco  . Not on file    Diagnosis:  COPD, moderate ([place])  Initial Exercise Prescription:     Initial Exercise Prescription - 05/30/16 1200      Date of Initial Exercise RX and Referring Provider   Referring Provider Clayborn Bigness MD      Discharge Exercise Prescription (Final Exercise Prescription Changes):     Exercise Prescription Changes - 07/20/16 1500      Exercise Review   Progression Yes     Response to Exercise   Blood Pressure (Admit) 126/66   Blood Pressure (Exercise) 138/66   Blood Pressure (Exit) 106/50   Heart Rate (Admit) 87 bpm   Heart Rate (Exercise) 130 bpm   Heart Rate (Exit) 96 bpm   Oxygen Saturation (Admit) 98 %   Oxygen Saturation (Exercise) 98 %   Oxygen Saturation (Exit) 96 %   Rating of Perceived Exertion (Exercise) 13   Perceived Dyspnea (Exercise) 3   Symptoms none   Comments Home Exercise Given 06/27/16   Duration Progress to 45 minutes of aerobic exercise without signs/symptoms of physical distress   Intensity THRR unchanged     Progression   Progression Continue to progress workloads to maintain intensity without signs/symptoms of physical distress.   Average METs 3.2     Resistance Training   Training Prescription Yes   Weight 3 lbs   Reps 10-15     Interval Training   Interval Training No     Treadmill   MPH 3   Grade 1.5   Minutes 15     NuStep   Level 4   Watts --  2.1   Minutes 15     Biostep-RELP   Level 5   Watts --  2 METs   Minutes 15     Home Exercise Plan   Plans to continue exercise at Home  walking   Frequency Add 2 additional days to program  exercise sessions.      Functional Capacity:     6 Minute Walk    Row Name 04/25/16 1542 06/06/16 1059 07/11/16 1027     6 Minute Walk   Phase Initial Mid Program Discharge   Distance 1160 feet 1350 feet 1442 feet   Distance % Change 0 % 16 % 24 %   Walk Time 6 minutes 6 minutes 6 minutes   # of Rest Breaks 0 0 0   MPH 2.2 2.56 2.73   METS 2.68 2.96 3.09   RPE 12 13 15    Perceived Dyspnea  2 3 4    VO2 Peak  - 10.36 10.83   Symptoms No No No   Resting HR 114 bpm 80 bpm 98 bpm   Resting BP 104/62 116/70 124/60   Max Ex. HR 140 bpm 125 bpm 135 bpm   Max Ex. BP 130/62 160/80 140/70   2 Minute Post BP  - 130/60 120/64     Interval HR   Baseline HR  - 80 98   1 Minute HR  - 114 100   2 Minute HR  - 119 112   3 Minute HR  - 120 133  4 Minute HR  - 123 135   5 Minute HR  - 124 135   6 Minute HR  - 125 120   2 Minute Post HR  - 92 103   Interval Heart Rate?  - Yes Yes     Interval Oxygen   Interval Oxygen?  - Yes Yes   Baseline Oxygen Saturation %  - 99 % 100 %   Baseline Liters of Oxygen  - 0 L 0 L   1 Minute Oxygen Saturation %  - 95 % 97 %   1 Minute Liters of Oxygen  - 0 L 0 L   2 Minute Oxygen Saturation %  - 99 % 98 %   2 Minute Liters of Oxygen  - 0 L 0 L   3 Minute Oxygen Saturation %  - 100 % 98 %   3 Minute Liters of Oxygen  - 0 L 0 L   4 Minute Oxygen Saturation %  - 99 % 96 %   4 Minute Liters of Oxygen  - 0 L 0 L   5 Minute Oxygen Saturation %  - 99 % 96 %   5 Minute Liters of Oxygen  - 0 L 0 L   6 Minute Oxygen Saturation %  - 99 % 96 %   6 Minute Liters of Oxygen  - 0 L 0 L   2 Minute Post Oxygen Saturation %  - 98 % 93 %   2 Minute Post Liters of Oxygen  - 0 L 0 L      Quality of Life:     Quality of Life - 07/18/16 1510      Quality of Life Scores   Health/Function Pre 11.53 %   Health/Function Post 20 %   Health/Function % Change 73.46 %   Socioeconomic Pre 17.1 %   Socioeconomic Post 18.33 %   Socioeconomic % Change  7.19 %    Psych/Spiritual Pre 23.71 %   Psych/Spiritual Post 18.21 %   Psych/Spiritual % Change -23.2 %   Family Pre 25.5 %   Family Post 27.5 %   Family % Change 7.84 %   GLOBAL Pre 16.98 %   GLOBAL Post 20.23 %   GLOBAL % Change 19.14 %      Personal Goals: Goals established at orientation with interventions provided to work toward goal.     Personal Goals and Risk Factors at Admission - 04/25/16 1456      Core Components/Risk Factors/Patient Goals on Admission    Weight Management Weight Gain;Yes   Intervention Weight Management: Develop a combined nutrition and exercise program designed to reach desired caloric intake, while maintaining appropriate intake of nutrient and fiber, sodium and fats, and appropriate energy expenditure required for the weight goal.   Admit Weight 109 lb (49.4 kg)   Goal Weight: Short Term 115 lb (52.2 kg)   Goal Weight: Long Term 125 lb (56.7 kg)   Expected Outcomes Short Term: Continue to assess and modify interventions until short term weight is achieved;Long Term: Adherence to nutrition and physical activity/exercise program aimed toward attainment of established weight goal;Weight Gain: Understanding of general recommendations for a high calorie, high protein meal plan that promotes weight gain by distributing calorie intake throughout the day with the consumption for 4-5 meals, snacks, and/or supplements   Sedentary Yes   Intervention Provide advice, education, support and counseling about physical activity/exercise needs.;Develop an individualized exercise prescription for aerobic and resistive training based on  initial evaluation findings, risk stratification, comorbidities and participant's personal goals.   Expected Outcomes Achievement of increased cardiorespiratory fitness and enhanced flexibility, muscular endurance and strength shown through measurements of functional capacity and personal statement of participant.   Increase Strength and Stamina Yes    Intervention Provide advice, education, support and counseling about physical activity/exercise needs.;Develop an individualized exercise prescription for aerobic and resistive training based on initial evaluation findings, risk stratification, comorbidities and participant's personal goals.   Expected Outcomes Achievement of increased cardiorespiratory fitness and enhanced flexibility, muscular endurance and strength shown through measurements of functional capacity and personal statement of participant.   Improve shortness of breath with ADL's Yes   Intervention Provide education, individualized exercise plan and daily activity instruction to help decrease symptoms of SOB with activities of daily living.   Expected Outcomes Short Term: Achieves a reduction of symptoms when performing activities of daily living.   Develop more efficient breathing techniques such as purse lipped breathing and diaphragmatic breathing; and practicing self-pacing with activity Yes   Intervention Provide education, demonstration and support about specific breathing techniuqes utilized for more efficient breathing. Include techniques such as pursed lipped breathing, diaphragmatic breathing and self-pacing activity.   Expected Outcomes Short Term: Participant will be able to demonstrate and use breathing techniques as needed throughout daily activities.   Increase knowledge of respiratory medications and ability to use respiratory devices properly  Yes   Intervention Provide education and demonstration as needed of appropriate use of medications, inhalers, and oxygen therapy.   Expected Outcomes Short Term: Achieves understanding of medications use. Understands that oxygen is a medication prescribed by physician. Demonstrates appropriate use of inhaler and oxygen therapy.       Personal Goals Discharge:     Goals and Risk Factor Review - 07/18/16 1523      Core Components/Risk Factors/Patient Goals Review   Review Ms  Dube will be graduating in 2 more sessions. She improved her post 57mwd by 256ft - Minimal Importance Difference for COPD is 98.83ft. She is looking forward to continued exercise in [employer] and is very active with her garden and flowers. She has a good understanding of her MDI's and shortness of breath - using PLB and pacing. She states she has improved her life tremendously after participating in [employer].      Nutrition & Weight - Outcomes:     Pre Biometrics - 04/25/16 1554      Pre Biometrics   Height 5' 2.5" (1.588 m)   Weight 108 lb 11.2 oz (49.3 kg)   Waist Circumference 28.3 inches   Hip Circumference 34 inches   Waist to Hip Ratio 0.83 %   BMI (Calculated) 19.6       Nutrition:     Nutrition Therapy & Goals - 04/25/16 1440      Intervention Plan   Intervention Prescribe, educate and counsel regarding individualized specific dietary modifications aiming towards targeted core components such as weight, hypertension, lipid management, diabetes, heart failure and other comorbidities.   Expected Outcomes Short Term Goal: Understand basic principles of dietary content, such as calories, fat, sodium, cholesterol and nutrients.;Short Term Goal: A plan has been developed with personal nutrition goals set during dietitian appointment.;Long Term Goal: Adherence to prescribed nutrition plan.      Nutrition Discharge:   Education Questionnaire Score:     Knowledge Questionnaire Score - 07/18/16 1510      Knowledge Questionnaire Score   Post Score 10/10      Goals  reviewed with patient; copy given to patient.

## 2016-07-20 NOTE — Progress Notes (Signed)
Daily Session Note  Patient Details  Name: Breanna Christian MRN: [id number] Date of Birth: 09-Sep-1941 Referring Provider:   Flowsheet Row Pulmonary Rehab from 05/30/2016 in [facility]  Referring Provider  Clayborn Bigness MD      Encounter Date: 07/20/2016  Check In:     Session Check In - 07/20/16 1514      Check-In   Location [facility]-Cardiac & Pulmonary Rehab   Staff Present Nyoka Cowden, RN, BSN, Kela Millin, BA, ACSM CEP, Exercise Physiologist;Laureen Janell Quiet, RRT, Respiratory Therapist   Supervising physician immediately available to respond to emergencies [facility] immediately available ER MD   Physician(s) Drs. Karma Greaser and Alfred Levins   Medication changes reported     No   Fall or balance concerns reported    No   Warm-up and Cool-down Performed as group-led Location manager Performed Yes   VAD Patient? No     Pain Assessment   Currently in Pain? No/denies   Multiple Pain Sites No         Goals Met:  Proper associated with RPD/PD & O2 Sat Independence with exercise equipment Using PLB without cueing & demonstrates good technique Exercise tolerated well Strength training completed today  Goals Unmet:  Not Applicable  Comments: [name].  Will continue to monitor for progression.    Dr. Emily Filbert is Medical Director for [place] and [facility].

## 2016-07-22 ENCOUNTER — Encounter: Payer: Medicare Other | Attending: Internal Medicine | Admitting: Respiratory Therapy

## 2016-07-22 DIAGNOSIS — J449 Chronic obstructive pulmonary disease, unspecified: Secondary | ICD-10-CM | POA: Diagnosis present

## 2016-07-22 NOTE — Progress Notes (Signed)
Discharge Summary  Patient Details  Name: Breanna Christian MRN: [id number] Date of Birth: 1941-02-12 Referring Provider:   Flowsheet Row Pulmonary Rehab from 05/30/2016 in [facility]  Referring Provider  Clayborn Bigness MD       Number of Visits: 36  Reason for Discharge:  Patient reached a stable level of exercise. Patient independent in their exercise.  Smoking History:  History  Smoking Status  . Never Smoker  Smokeless Tobacco  . Not on file    Diagnosis:  COPD, moderate ([place])  ADL [facility]:     Pulmonary Assessment Scores    Row Name 04/26/16 1021 06/10/16 1251 07/18/16 1509     ADL [facility]   ADL Phase Entry Mid Exit   SOB Score total 48 73 74   Rest 1 0 2   Walk 2 2 3    Stairs 5 5 4    Bath 2 0 4   Dress 1 3 3    Shop 3 3 3       Initial Exercise Prescription:     Initial Exercise Prescription - 05/30/16 1200      Date of Initial Exercise RX and Referring Provider   Referring Provider Clayborn Bigness MD      Discharge Exercise Prescription (Final Exercise Prescription Changes):     Exercise Prescription Changes - 07/20/16 1500      Exercise Review   Progression Yes     Response to Exercise   Blood Pressure (Admit) 126/66   Blood Pressure (Exercise) 138/66   Blood Pressure (Exit) 106/50   Heart Rate (Admit) 87 bpm   Heart Rate (Exercise) 130 bpm   Heart Rate (Exit) 96 bpm   Oxygen Saturation (Admit) 98 %   Oxygen Saturation (Exercise) 98 %   Oxygen Saturation (Exit) 96 %   Rating of Perceived Exertion (Exercise) 13   Perceived Dyspnea (Exercise) 3   Symptoms none   Comments Home Exercise Given 06/27/16   Duration Progress to 45 minutes of aerobic exercise without signs/symptoms of physical distress   Intensity THRR unchanged     Progression   Progression Continue to progress workloads to maintain intensity without signs/symptoms of physical distress.   Average METs 3.2     Resistance Training   Training Prescription Yes    Weight 3 lbs   Reps 10-15     Interval Training   Interval Training No     Treadmill   MPH 3   Grade 1.5   Minutes 15     NuStep   Level 4   Watts --  2.1   Minutes 15     Biostep-RELP   Level 5   Watts --  2 METs   Minutes 15     Home Exercise Plan   Plans to continue exercise at Home  walking   Frequency Add 2 additional days to program exercise sessions.      Functional Capacity:     6 Minute Walk    Row Name 04/25/16 1542 06/06/16 1059 07/11/16 1027     6 Minute Walk   Phase Initial Mid Program Discharge   Distance 1160 feet 1350 feet 1442 feet   Distance % Change 0 % 16 % 24 %   Walk Time 6 minutes 6 minutes 6 minutes   # of Rest Breaks 0 0 0   MPH 2.2 2.56 2.73   METS 2.68 2.96 3.09   RPE 12 13 15    Perceived Dyspnea  2 3 4  VO2 Peak  - 10.36 10.83   Symptoms No No No   Resting HR 114 bpm 80 bpm 98 bpm   Resting BP 104/62 116/70 124/60   Max Ex. HR 140 bpm 125 bpm 135 bpm   Max Ex. BP 130/62 160/80 140/70   2 Minute Post BP  - 130/60 120/64     Interval HR   Baseline HR  - 80 98   1 Minute HR  - 114 100   2 Minute HR  - 119 112   3 Minute HR  - 120 133   4 Minute HR  - 123 135   5 Minute HR  - 124 135   6 Minute HR  - 125 120   2 Minute Post HR  - 92 103   Interval Heart Rate?  - Yes Yes     Interval Oxygen   Interval Oxygen?  - Yes Yes   Baseline Oxygen Saturation %  - 99 % 100 %   Baseline Liters of Oxygen  - 0 L 0 L   1 Minute Oxygen Saturation %  - 95 % 97 %   1 Minute Liters of Oxygen  - 0 L 0 L   2 Minute Oxygen Saturation %  - 99 % 98 %   2 Minute Liters of Oxygen  - 0 L 0 L   3 Minute Oxygen Saturation %  - 100 % 98 %   3 Minute Liters of Oxygen  - 0 L 0 L   4 Minute Oxygen Saturation %  - 99 % 96 %   4 Minute Liters of Oxygen  - 0 L 0 L   5 Minute Oxygen Saturation %  - 99 % 96 %   5 Minute Liters of Oxygen  - 0 L 0 L   6 Minute Oxygen Saturation %  - 99 % 96 %   6 Minute Liters of Oxygen  - 0 L 0 L   2 Minute Post  Oxygen Saturation %  - 98 % 93 %   2 Minute Post Liters of Oxygen  - 0 L 0 L      Psychological, QOL, Others - Outcomes: PHQ 2/9: Depression screen [facility] 2/9 07/18/2016 04/25/2016  Decreased Interest 1 0  Down, Depressed, Hopeless 0 0  PHQ - 2 Score 1 0  Altered sleeping 0 3  Tired, decreased energy 2 3  Change in appetite 1 3  Feeling bad or failure about yourself  0 0  Trouble concentrating 0 0  Moving slowly or fidgety/restless 0 0  Suicidal thoughts 0 0  PHQ-9 Score 4 9  Difficult doing work/chores Not difficult at all Somewhat difficult    Quality of Life:     Quality of Life - 07/18/16 1510      Quality of Life Scores   Health/Function Pre 11.53 %   Health/Function Post 20 %   Health/Function % Change 73.46 %   Socioeconomic Pre 17.1 %   Socioeconomic Post 18.33 %   Socioeconomic % Change  7.19 %   Psych/Spiritual Pre 23.71 %   Psych/Spiritual Post 18.21 %   Psych/Spiritual % Change -23.2 %   Family Pre 25.5 %   Family Post 27.5 %   Family % Change 7.84 %   GLOBAL Pre 16.98 %   GLOBAL Post 20.23 %   GLOBAL % Change 19.14 %      Personal Goals: Goals established at orientation with interventions provided to work  toward goal.     Personal Goals and Risk Factors at Admission - 04/25/16 1456      Core Components/Risk Factors/Patient Goals on Admission    Weight Management Weight Gain;Yes   Intervention Weight Management: Develop a combined nutrition and exercise program designed to reach desired caloric intake, while maintaining appropriate intake of nutrient and fiber, sodium and fats, and appropriate energy expenditure required for the weight goal.   Admit Weight 109 lb (49.4 kg)   Goal Weight: Short Term 115 lb (52.2 kg)   Goal Weight: Long Term 125 lb (56.7 kg)   Expected Outcomes Short Term: Continue to assess and modify interventions until short term weight is achieved;Long Term: Adherence to nutrition and physical activity/exercise program aimed toward  attainment of established weight goal;Weight Gain: Understanding of general recommendations for a high calorie, high protein meal plan that promotes weight gain by distributing calorie intake throughout the day with the consumption for 4-5 meals, snacks, and/or supplements   Sedentary Yes   Intervention Provide advice, education, support and counseling about physical activity/exercise needs.;Develop an individualized exercise prescription for aerobic and resistive training based on initial evaluation findings, risk stratification, comorbidities and participant's personal goals.   Expected Outcomes Achievement of increased cardiorespiratory fitness and enhanced flexibility, muscular endurance and strength shown through measurements of functional capacity and personal statement of participant.   Increase Strength and Stamina Yes   Intervention Provide advice, education, support and counseling about physical activity/exercise needs.;Develop an individualized exercise prescription for aerobic and resistive training based on initial evaluation findings, risk stratification, comorbidities and participant's personal goals.   Expected Outcomes Achievement of increased cardiorespiratory fitness and enhanced flexibility, muscular endurance and strength shown through measurements of functional capacity and personal statement of participant.   Improve shortness of breath with ADL's Yes   Intervention Provide education, individualized exercise plan and daily activity instruction to help decrease symptoms of SOB with activities of daily living.   Expected Outcomes Short Term: Achieves a reduction of symptoms when performing activities of daily living.   Develop more efficient breathing techniques such as purse lipped breathing and diaphragmatic breathing; and practicing self-pacing with activity Yes   Intervention Provide education, demonstration and support about specific breathing techniuqes utilized for more  efficient breathing. Include techniques such as pursed lipped breathing, diaphragmatic breathing and self-pacing activity.   Expected Outcomes Short Term: Participant will be able to demonstrate and use breathing techniques as needed throughout daily activities.   Increase knowledge of respiratory medications and ability to use respiratory devices properly  Yes   Intervention Provide education and demonstration as needed of appropriate use of medications, inhalers, and oxygen therapy.   Expected Outcomes Short Term: Achieves understanding of medications use. Understands that oxygen is a medication prescribed by physician. Demonstrates appropriate use of inhaler and oxygen therapy.       Personal Goals Discharge:     Goals and Risk Factor Review    Row Name 05/02/16 1210 05/16/16 1000 05/16/16 1552 05/18/16 1248 06/06/16 1542     Core Components/Risk Factors/Patient Goals Review   Personal Goals Review Develop more efficient breathing techniques such as purse lipped breathing and diaphragmatic breathing and practicing self-pacing with activity. Develop more efficient breathing techniques such as purse lipped breathing and diaphragmatic breathing and practicing self-pacing with activity. Weight Management/Obesity Improve shortness of breath with ADL's;Sedentary Sedentary;Improve shortness of breath with ADL's;Develop more efficient breathing techniques such as purse lipped breathing and diaphragmatic breathing and practicing self-pacing with activity.   Review  Discussed and explained pursed lip breathing. Patient demonstrated understanding of breathing techniques.  Ms Rovira uses PLB with her exercise goals and with activities at home. She has good technique and requires no queing. Ms Opie felt very positive about her weight gain today. She is up 2lbs. Having meet with the dietitian, she is drinking a protein drink called Primar which has 60grams of protein and tastes good to her . Ms Verdone reports  being able to climbsteps better since beginning LW. Ms Guillet increased her Mid34mwd by 142ft. Minimal Importance Difference for COPD is 98.70ft. She states she has increased her energy level and activity at home. She is using PLB with her exercise and activity at home.   Expected Outcomes Patient will independently use pursed lip breathing techniques during exercise to help tolerate SOB.  Continue using PLB for management of her shortness of breath with activity. Continue working on gaining weight - her goals is a weight of 120lbs. Ms Cissell will see continued improvement with ADLS as she continues to progress with exercise  -   Row Name 06/13/16 1152 06/15/16 1231 07/05/16 0727 07/18/16 1514 07/18/16 1523     Core Components/Risk Factors/Patient Goals Review   Personal Goals Review Increase Strength and Stamina Improve shortness of breath with ADL's;Increase knowledge of respiratory medications and ability to use respiratory devices properly. Sedentary;Increase Strength and Stamina;Improve shortness of breath with ADL's;Increase knowledge of respiratory medications and ability to use respiratory devices properly.;Develop more efficient breathing techniques such as purse lipped breathing and diaphragmatic breathing and practicing self-pacing with activity. Sedentary;Increase Strength and Stamina;Improve shortness of breath with ADL's;Develop more efficient breathing techniques such as purse lipped breathing and diaphragmatic breathing and practicing self-pacing with activity.;Increase knowledge of respiratory medications and ability to use respiratory devices properly.  -   Review Coralyn Mark Reported that she was not exercising at home but has been consistantly attending class. She feels much stronger and said she has noticed great improvements in strength and stamina.  Ms Cruces arrived to [facility] short of breath. Her breath sound were expiratory breath sound and decreased. She was given an SVN with albuterol.  She was educated on her nebulizer - explained reservoir tubing, cleaning,. She did take the nebulizer home. Her BBS improved and her shortness of breath improved as well. Ms Bley was able to complete her treadmill goal.     Ms Moothart states she is doing so much better, both physically and mentally. She is now working in the garden with her flowers - pacing herself with PLB and using her inhalers as ordered for her shortness of breath. She is looking into [employer], since her graduation is soon. Ms Brooner will be graduating in 2 more sessions. She improved her post 60mwd by 274ft - Minimal Importance Difference for COPD is 98.6ft. She is looking forward to continued exercise in [employer] and is very active with her garden and flowers. She has a good understnading of her MDI's and shortness of breath - using PLB and pacing. She states she has improved her life tremendously after participating in [place]. Ms Cody will be graduating in 2 more sessions. She improved her post 17mwd by 252ft - Minimal Importance Difference for COPD is 98.11ft. She is looking forward to continued exercise in [employer] and is very active with her garden and flowers. She has a good understanding of her MDI's and shortness of breath - using PLB and pacing. She states she has improved her life tremendously after participating in  [facility].   Expected Outcomes Coralyn Mark will maintain consistant attendance in class and use breathing techniques to manage SOB in order to be able to continue progression with her workloads.   -  - Continue exercising and being active and continue managing her COPD with proper MDI techniques, PLB, early treatment of an exacerbation, and learning more about COPD through education material.  -      Nutrition & Weight - Outcomes:     Pre Biometrics - 04/25/16 1554      Pre Biometrics   Height 5' 2.5" (1.588 m)   Weight 108 lb 11.2 oz (49.3 kg)   Waist Circumference 28.3 inches   Hip Circumference 34  inches   Waist to Hip Ratio 0.83 %   BMI (Calculated) 19.6       Nutrition:     Nutrition Therapy & Goals - 04/25/16 1440      Intervention Plan   Intervention Prescribe, educate and counsel regarding individualized specific dietary modifications aiming towards targeted core components such as weight, hypertension, lipid management, diabetes, heart failure and other comorbidities.   Expected Outcomes Short Term Goal: Understand basic principles of dietary content, such as calories, fat, sodium, cholesterol and nutrients.;Short Term Goal: A plan has been developed with personal nutrition goals set during dietitian appointment.;Long Term Goal: Adherence to prescribed nutrition plan.      Nutrition Discharge:   Education Questionnaire Score:     Knowledge Questionnaire Score - 07/18/16 1510      Knowledge Questionnaire Score   Post Score 10/10      Goals reviewed with patient; copy given to patient.

## 2016-07-22 NOTE — Progress Notes (Signed)
Daily Session Note  Patient Details  Name: Breanna Christian MRN: [id number] Date of Birth: 1941/10/03 Referring Provider:   Flowsheet Row Pulmonary Rehab from 05/30/2016 in [facility] Cardiac and Pulmonary Rehab  Referring Provider  Clayborn Bigness MD      Encounter Date: 07/22/2016  Check In:     Session Check In - 07/22/16 1057      Check-In   Location [facility]-Cardiac & Pulmonary Rehab   Staff Present Gerlene Burdock, RN, BSN;Stacey Blanch Media, RRT, RCP, Respiratory Dareen Piano, BA, ACSM CEP, Exercise Physiologist   Supervising physician immediately available to respond to emergencies [facility] immediately available ER MD   Physician(s) Drs. Jimmye Norman and Archie Balboa   Medication changes reported     No   Fall or balance concerns reported    No   Warm-up and Cool-down Performed as group-led Location manager Performed Yes   VAD Patient? No     Pain Assessment   Currently in Pain? No/denies   Multiple Pain Sites No         Goals Met:  Independence with exercise equipment Using PLB without cueing & demonstrates good technique Exercise tolerated well Personal goals reviewed Strength training completed today  Goals Unmet:  Not Applicable  Comments:  Breanna Christian graduated today from cardiac rehab with 36 sessions completed.  Details of the patient's exercise prescription and what She needs to do in order to continue the prescription and progress were discussed with patient.  Patient was given a copy of prescription and goals.  Patient verbalized understanding.  Breanna Christian plans to continue to exercise by walking and coming to [employer].    Dr. Emily Filbert is Medical Director for [place] and [facility].

## 2016-07-26 ENCOUNTER — Encounter: Payer: Self-pay | Admitting: *Deleted

## 2016-07-26 DIAGNOSIS — J449 Chronic obstructive pulmonary disease, unspecified: Secondary | ICD-10-CM

## 2016-07-26 NOTE — Progress Notes (Signed)
Pulmonary Individual Treatment Plan  Patient Details  Name: Breanna Christian MRN: [id number] Date of Birth: 1941-11-21 Referring Provider:   Flowsheet Row Pulmonary Rehab from 05/30/2016 in [facility] Cardiac and Pulmonary Rehab  Referring Provider  Clayborn Bigness MD      Initial Encounter Date:  Flowsheet Row Pulmonary Rehab from 05/30/2016 in [facility] Cardiac and Pulmonary Rehab  Referring Provider  Clayborn Bigness MD      Visit Diagnosis: COPD, moderate ([place])  Patient's Home Medications on Admission:  Current Outpatient Prescriptions:  .  aspirin EC 81 MG tablet, Take by mouth., Disp: , Rfl:  .  benzonatate (TESSALON) 100 MG capsule, Take by mouth 3 (three) times daily as needed for cough., Disp: , Rfl:  .  Calcium Carbonate-Vitamin D (CALCIUM-VITAMIN D) 500-200 MG-UNIT tablet, Take by mouth., Disp: , Rfl:  .  Cyanocobalamin (RA VITAMIN B-12 TR) 1000 MCG TBCR, Take by mouth., Disp: , Rfl:  .  diltiazem (TIAZAC) 240 MG 24 hr capsule, , Disp: , Rfl:  .  Fluticasone-Salmeterol (ADVAIR) 250-50 MCG/DOSE AEPB, Inhale 1 puff into the lungs 2 (two) times daily., Disp: , Rfl:  .  folic acid (FOLVITE) 1 MG tablet, Take by mouth., Disp: , Rfl:  .  ipratropium-albuterol (DUONEB) 0.5-2.5 (3) MG/3ML SOLN, Take 3 mLs by nebulization., Disp: , Rfl:  .  Multiple Vitamin (MULTI-VITAMINS) TABS, Take by mouth. Reported on 04/25/2016, Disp: , Rfl:  .  predniSONE (DELTASONE) 10 MG tablet, Take 10 mg by mouth daily with breakfast. Reported on 04/25/2016, Disp: , Rfl:  .  roflumilast (DALIRESP) 500 MCG TABS tablet, Take 500 mcg by mouth daily., Disp: , Rfl:  .  tiotropium (SPIRIVA) 18 MCG inhalation capsule, Place 18 mcg into inhaler and inhale daily., Disp: , Rfl:  .  triamterene-hydrochlorothiazide (DYAZIDE) 37.5-25 MG capsule, Take by mouth., Disp: , Rfl:  .  Vitamin D, Ergocalciferol, (DRISDOL) 50000 units CAPS capsule, Take by mouth., Disp: , Rfl:   Past Medical History: Past Medical History:  Diagnosis Date  .  Non-Hodgkin lymphoma ([place]) 2003  . Rheumatoid arthritis ([place])   . Xerostomia     Tobacco Use: History  Smoking Status  . Never Smoker  Smokeless Tobacco  . Not on file    Labs: Recent Review Flowsheet Data    There is no flowsheet data to display.       ADL UCSD:     Pulmonary Assessment Scores    Row Name 06/10/16 1251 07/18/16 1509       ADL UCSD   ADL Phase Mid Exit    SOB Score total 73 74    Rest 0 2    Walk 2 3    Stairs 5 4    Bath 0 4    Dress 3 3    Shop 3 3       Pulmonary Function Assessment:   Exercise Target Goals:    Exercise Program Goal: Individual exercise prescription set with THRR, safety & activity barriers. Participant demonstrates ability to understand and report RPE using BORG scale, to self-measure pulse accurately, and to acknowledge the importance of the exercise prescription.  Exercise Prescription Goal: Starting with aerobic activity 30 plus minutes a day, 3 days per week for initial exercise prescription. Provide home exercise prescription and guidelines that participant acknowledges understanding prior to discharge.  Activity Barriers & Risk Stratification:   6 Minute Walk:     6 Minute Walk    Row Name 06/06/16 1059 07/11/16 1027  6 Minute Walk   Phase Mid Program Discharge    Distance 1350 feet 1442 feet    Distance % Change 16 % 24 %    Walk Time 6 minutes 6 minutes    # of Rest Breaks 0 0    MPH 2.56 2.73    METS 2.96 3.09    RPE 13 15    Perceived Dyspnea  3 4    VO2 Peak 10.36 10.83    Symptoms No No    Resting HR 80 bpm 98 bpm    Resting BP 116/70 124/60    Max Ex. HR 125 bpm 135 bpm    Max Ex. BP 160/80 140/70    2 Minute Post BP 130/60 120/64      Interval HR   Baseline HR 80 98    1 Minute HR 114 100    2 Minute HR 119 112    3 Minute HR 120 133    4 Minute HR 123 135    5 Minute HR 124 135    6 Minute HR 125 120    2 Minute Post HR 92 103    Interval Heart Rate? Yes Yes       Interval Oxygen   Interval Oxygen? Yes Yes    Baseline Oxygen Saturation % 99 % 100 %    Baseline Liters of Oxygen 0 L 0 L    1 Minute Oxygen Saturation % 95 % 97 %    1 Minute Liters of Oxygen 0 L 0 L    2 Minute Oxygen Saturation % 99 % 98 %    2 Minute Liters of Oxygen 0 L 0 L    3 Minute Oxygen Saturation % 100 % 98 %    3 Minute Liters of Oxygen 0 L 0 L    4 Minute Oxygen Saturation % 99 % 96 %    4 Minute Liters of Oxygen 0 L 0 L    5 Minute Oxygen Saturation % 99 % 96 %    5 Minute Liters of Oxygen 0 L 0 L    6 Minute Oxygen Saturation % 99 % 96 %    6 Minute Liters of Oxygen 0 L 0 L    2 Minute Post Oxygen Saturation % 98 % 93 %    2 Minute Post Liters of Oxygen 0 L 0 L       Initial Exercise Prescription:     Initial Exercise Prescription - 05/30/16 1200      Date of Initial Exercise RX and Referring Provider   Referring Provider Clayborn Bigness MD      Perform Capillary Blood Glucose checks as needed.  Exercise Prescription Changes:     Exercise Prescription Changes    Row Name 06/08/16 1400 06/22/16 1500 06/27/16 1200 07/05/16 1500 07/11/16 1000     Exercise Review   Progression Yes Yes Yes Yes Yes     Response to Exercise   Blood Pressure (Admit) 110/60 110/60  - 100/60  -   Blood Pressure (Exercise) 146/62 128/60  - 120/68  -   Blood Pressure (Exit) 112/62 134/70  - 98/60  -   Heart Rate (Admit) 116 bpm 96 bpm  - 78 bpm  -   Heart Rate (Exercise) 130 bpm 124 bpm  - 118 bpm  -   Heart Rate (Exit) 103 bpm 98 bpm  - 87 bpm  -   Oxygen Saturation (Admit) 97 % 96 %  -  97 %  -   Oxygen Saturation (Exercise) 98 % 97 %  - 98 %  -   Oxygen Saturation (Exit) 97 % 97 %  - 98 %  -   Rating of Perceived Exertion (Exercise) 15 13  - 13  -   Perceived Dyspnea (Exercise) 3 3  - 3  -   Symptoms none none none none none   Comments Home Exercise Given 05/18/16 Home Exercise Given 05/18/16 Home Exercise Given 06/27/16 Home Exercise Given 06/27/16 Home Exercise Given 06/27/16    Duration Progress to 45 minutes of aerobic exercise without signs/symptoms of physical distress Progress to 45 minutes of aerobic exercise without signs/symptoms of physical distress Progress to 45 minutes of aerobic exercise without signs/symptoms of physical distress Progress to 45 minutes of aerobic exercise without signs/symptoms of physical distress Progress to 45 minutes of aerobic exercise without signs/symptoms of physical distress   Intensity THRR unchanged THRR unchanged THRR unchanged THRR unchanged THRR unchanged     Progression   Progression Continue to progress workloads to maintain intensity without signs/symptoms of physical distress. Continue to progress workloads to maintain intensity without signs/symptoms of physical distress. Continue to progress workloads to maintain intensity without signs/symptoms of physical distress. Continue to progress workloads to maintain intensity without signs/symptoms of physical distress. Continue to progress workloads to maintain intensity without signs/symptoms of physical distress.   Average METs 2.47 3.23 3.23 3.5 3.5     Resistance Training   Training Prescription Yes Yes Yes Yes Yes   Weight 2 2 2 3  lbs 3 lbs   Reps 10-15 10-15 10-15 10-15 10-15     Interval Training   Interval Training No No No No No     Treadmill   MPH 2.5 3 3 3 3    Grade 0 0.5 0.5 1 1    Minutes 15 15 15 15 15      NuStep   Level 3 3 3 3 3    Watts -  2.5 METs -  3.1 METs -  3.1 METs -  2.8 METs -  2.8 METs   Minutes 15 15 15 15 15      Biostep-RELP   Level 3 3 3 4 4    Watts -  2.0 METs -  3.0 METs -  3.0 METs -  4.0 METs -  4.0 METs   Minutes 15 15 15 15 15      Home Exercise Plan   Plans to continue exercise at Home  Walking at [facility]  Walking at [facility]  Walking at [facility]  Walking at [facility] (comment)  looking into different community facilities    Frequency Add 1 additional day to program exercise sessions.  Add 1 additional day to program exercise sessions. Add 2 additional days to program exercise sessions. Add 2 additional days to program exercise sessions. Add 2 additional days to program exercise sessions.   [place] Name 07/18/16 1500 07/20/16 1500           Exercise Review   Progression Yes Yes        Response to Exercise   Blood Pressure (Admit) 122/60 126/66      Blood Pressure (Exercise) 140/80 138/66      Blood Pressure (Exit) 102/60 106/50      Heart Rate (Admit) 89 bpm 87 bpm      Heart Rate (Exercise) 133 bpm 130 bpm      Heart Rate (Exit) 106 bpm 96 bpm  Oxygen Saturation (Admit) 98 % 98 %      Oxygen Saturation (Exercise) 100 % 98 %      Oxygen Saturation (Exit) 98 % 96 %      Rating of Perceived Exertion (Exercise) 13 13      Perceived Dyspnea (Exercise) 3 3      Symptoms none none      Comments Home Exercise Given 06/27/16 Home Exercise Given 06/27/16      Duration Progress to 50 minutes of aerobic without signs/symptoms of physical distress Progress to 45 minutes of aerobic exercise without signs/symptoms of physical distress      Intensity THRR unchanged THRR unchanged        Progression   Progression Continue to progress workloads to maintain intensity without signs/symptoms of physical distress. Continue to progress workloads to maintain intensity without signs/symptoms of physical distress.      Average METs 3.6 3.2        Resistance Training   Training Prescription Yes Yes      Weight 3 lbs 3 lbs      Reps 10-15 10-15        Interval Training   Interval Training No No        Treadmill   MPH 3 3      Grade 1.5 1.5      Minutes 15 15        NuStep   Level 4 4      Watts  - -  2.1      Minutes 15 15        Biostep-RELP   Level 5 5      Watts  - -  2 METs      Minutes 15 15        Home Exercise Plan   Plans to continue exercise at [facility]  walking      Frequency Add 2 additional days to program exercise sessions. Add 2 additional days to  program exercise sessions.         Exercise Comments:     Exercise Comments    Row Name 06/06/16 1103 06/08/16 1442 06/22/16 1459 06/27/16 1251 07/05/16 1526   Exercise Comments 6 min walk evaluation done today. Results were reviewed with patient.  Coralyn Mark is now up to 15 min continuous on the treadmill.  She continues to make good progress with exercise. Coralyn Mark is doing well with exercsie.  She continues to make improvements and is now up to 3.0 mph on treadmill for full 15 min. We will continue to monitor for progression. Reviewed home exercise with pt today.  Pt plans to walk at home for exercise.  Reviewed THR, pulse, RPE, sign and symptoms, and when to call 911 or MD.  Pt plans to purchase a pulse oximeter for home use to monitor heart rate and oxygen saturations at home.  Also discussed weather considerations and indoor options.  Pt voiced understanding. Coralyn Mark is doing great with her exercise.  She is up to a 1% grade on her treadmill now.  We will continue to monitor her for progression.   [place] Name 07/11/16 1031 07/20/16 1515 07/22/16 1059       Exercise Comments 6 min walk evaluation done today. See 6 min data sheet for detailed results.  Coralyn Mark is set to graduate on Friday with plans to continue to exercise by walking at home and coming to [employer]. Mirtie graduated today from cardiac rehab with 36 sessions completed.  Details of the patient's exercise prescription and what She needs to do in order to continue the prescription and progress were discussed with patient.  Patient was given a copy of prescription and goals.  Patient verbalized understanding.  Norene plans to continue to exercise by walking and coming to [employer].        Discharge Exercise Prescription (Final Exercise Prescription Changes):     Exercise Prescription Changes - 07/20/16 1500      Exercise Review   Progression Yes     Response to Exercise   Blood Pressure (Admit) 126/66   Blood Pressure  (Exercise) 138/66   Blood Pressure (Exit) 106/50   Heart Rate (Admit) 87 bpm   Heart Rate (Exercise) 130 bpm   Heart Rate (Exit) 96 bpm   Oxygen Saturation (Admit) 98 %   Oxygen Saturation (Exercise) 98 %   Oxygen Saturation (Exit) 96 %   Rating of Perceived Exertion (Exercise) 13   Perceived Dyspnea (Exercise) 3   Symptoms none   Comments Home Exercise Given 06/27/16   Duration Progress to 45 minutes of aerobic exercise without signs/symptoms of physical distress   Intensity THRR unchanged     Progression   Progression Continue to progress workloads to maintain intensity without signs/symptoms of physical distress.   Average METs 3.2     Resistance Training   Training Prescription Yes   Weight 3 lbs   Reps 10-15     Interval Training   Interval Training No     Treadmill   MPH 3   Grade 1.5   Minutes 15     NuStep   Level 4   Watts --  2.1   Minutes 15     Biostep-RELP   Level 5   Watts --  2 METs   Minutes 15     Home Exercise Plan   Plans to continue exercise at Home  walking   Frequency Add 2 additional days to program exercise sessions.       Nutrition:  Target Goals: Understanding of nutrition guidelines, daily intake of sodium 1500mg , cholesterol 200mg , calories 30% from fat and 7% or less from saturated fats, daily to have 5 or more servings of fruits and vegetables.  Biometrics:    Nutrition Therapy Plan and Nutrition Goals:   Nutrition Discharge: Rate Your Plate Scores:   Psychosocial: Target Goals: Acknowledge presence or absence of depression, maximize coping skills, provide positive support system. Participant is able to verbalize types and ability to use techniques and skills needed for reducing stress and depression.  Initial Review & Psychosocial Screening:   Quality of Life Scores:     Quality of Life - 07/18/16 1510      Quality of Life Scores   Health/Function Pre 11.53 %   Health/Function Post 20 %   Health/Function %  Change 73.46 %   Socioeconomic Pre 17.1 %   Socioeconomic Post 18.33 %   Socioeconomic % Change  7.19 %   Psych/Spiritual Pre 23.71 %   Psych/Spiritual Post 18.21 %   Psych/Spiritual % Change -23.2 %   Family Pre 25.5 %   Family Post 27.5 %   Family % Change 7.84 %   GLOBAL Pre 16.98 %   GLOBAL Post 20.23 %   GLOBAL % Change 19.14 %      PHQ-9: Recent Review Flowsheet Data    Depression screen [facility] 2/9 07/18/2016 04/25/2016   Decreased Interest 1 0   Down, Depressed, Hopeless 0 0  PHQ - 2 Score 1 0   Altered sleeping 0 3   Tired, decreased energy 2 3   Change in appetite 1 3   Feeling bad or failure about yourself  0 0   Trouble concentrating 0 0   Moving slowly or fidgety/restless 0 0   Suicidal thoughts 0 0   PHQ-9 Score 4 9   Difficult doing work/chores Not difficult at all Somewhat difficult      Psychosocial Evaluation and Intervention:   Psychosocial Re-Evaluation:     Psychosocial Re-Evaluation    Row Name 06/08/16 1043 06/22/16 1202 07/04/16 1139         Psychosocial Re-Evaluation   Comments Counselor follow up with Ms. Rainey today reporting feeling "more alive" since coming into this program and working out consistently.  She is able to go up and down stairs with greater ease and states her mood has improved as well.  She continues to struggle some with sleep issues but states she is okay with taking naps.  counselor commended Ms. Frericks for her dedication and commitment to consistent exercise.   At face to face meeting today with Dr Emily Filbert, Coralyn Mark shared that she is feeling much stronger than her first day with the program. She stated that the exercise sessions in Pulmonary Rehab have helped her improve her strength and stamina.  She also stated that she had been depressed since her husbands death in Mar 12, 2023. Since she started the Pulmonary Rehab program, her depression is improved and she is doing more than sitting at home on the couch.  Counselor follow up  with Ms. F today stating she has seen progress in the areas of her mood; her energy; her appetite; her strength; and her legs are strong enough for her to be out working in her flower garden.  She continues to gain weight which was one of her goals as well.  Ms. Wanda Plump continues to struggle with sleeping more than 4 hours/night.  She reports medications have been prescribed but she has been hesitant to take them.  Counselor encouraged trying maybe 1/2 dose if she is concerned about living alone and possible side effects.  She takes naps daily and does enjoy being awake at night to communicate or follow up on her family in [place], so there is a purpose for her sleep issues.  Counselor will continue to follow with Ms. Wanda Plump and commended her on her progress in so many areas.       Education: Education Goals: Education classes will be provided on a weekly basis, covering required topics. Participant will state understanding/return demonstration of topics presented.  Learning Barriers/Preferences:   Education Topics: Initial Evaluation Education: - Verbal, written and demonstration of respiratory meds, RPE/PD scales, oximetry and breathing techniques. Instruction on use of nebulizers and MDIs: cleaning and proper use, rinsing mouth with steroid doses and importance of monitoring MDI activations.   General Nutrition Guidelines/Fats and Fiber: -Group instruction provided by verbal, written material, models and posters to present the general guidelines for heart healthy nutrition. Gives an explanation and review of dietary fats and fiber. Flowsheet Row Pulmonary Rehab from 07/15/2016 in [facility] Cardiac and Pulmonary Rehab  Date  06/13/16  Educator  CR  Instruction Review Code  2- meets goals/outcomes      Controlling Sodium/Reading Food Labels: -Group verbal and written material supporting the discussion of sodium use in heart healthy nutrition. Review and explanation with models, verbal and written materials  for utilization of the food label.  Flowsheet Row Pulmonary Rehab from 07/15/2016 in [facility] Cardiac and Pulmonary Rehab  Date  05/09/16  Educator  CR  Instruction Review Code  2- meets goals/outcomes      Exercise Physiology & Risk Factors: - Group verbal and written instruction with models to review the exercise physiology of the cardiovascular system and associated critical values. Details cardiovascular disease risk factors and the goals associated with each risk factor. Flowsheet Row Pulmonary Rehab from 07/15/2016 in [facility] Cardiac and Pulmonary Rehab  Date  07/13/16  Educator  AS  Instruction Review Code  2- meets goals/outcomes      Aerobic Exercise & Resistance Training: - Gives group verbal and written discussion on the health impact of inactivity. On the components of aerobic and resistive training programs and the benefits of this training and how to safely progress through these programs.   Flexibility, Balance, General Exercise Guidelines: - Provides group verbal and written instruction on the benefits of flexibility and balance training programs. Provides general exercise guidelines with specific guidelines to those with heart or lung disease. Demonstration and skill practice provided. Flowsheet Row Pulmonary Rehab from 07/15/2016 in [facility] Cardiac and Pulmonary Rehab  Date  06/15/16  Educator  AS  Instruction Review Code  2- meets goals/outcomes      Stress Management: - Provides group verbal and written instruction about the health risks of elevated stress, cause of high stress, and healthy ways to reduce stress.   Depression: - Provides group verbal and written instruction on the correlation between heart/lung disease and depressed mood, treatment options, and the stigmas associated with seeking treatment. Flowsheet Row Pulmonary Rehab from 07/15/2016 in [facility] Cardiac and Pulmonary Rehab  Date  05/04/16  Educator  [facility]  Instruction Review Code  2- meets goals/outcomes       Exercise & Equipment Safety: - Individual verbal instruction and demonstration of equipment use and safety with use of the equipment. Flowsheet Row Pulmonary Rehab from 07/15/2016 in [facility] Cardiac and Pulmonary Rehab  Date  04/25/16  Educator  SB  Instruction Review Code  2- meets goals/outcomes      Infection Prevention: - Provides verbal and written material to individual with discussion of infection control including proper hand washing and proper equipment cleaning during exercise session. Flowsheet Row Pulmonary Rehab from 07/15/2016 in [facility] Cardiac and Pulmonary Rehab  Date  04/25/16  Educator  Sb  Instruction Review Code  2- meets goals/outcomes      Falls Prevention: - Provides verbal and written material to individual with discussion of falls prevention and safety. Flowsheet Row Pulmonary Rehab from 07/15/2016 in [facility] Cardiac and Pulmonary Rehab  Date  04/25/16  Educator  Sb  Instruction Review Code  2- meets goals/outcomes      Diabetes: - Individual verbal and written instruction to review signs/symptoms of diabetes, desired ranges of glucose level fasting, after meals and with exercise. Advice that pre and post exercise glucose checks will be done for 3 sessions at entry of program.   Chronic Lung Diseases: - Group verbal and written instruction to review new updates, new respiratory medications, new advancements in procedures and treatments. Provide informative websites and "800" numbers of self-education. Flowsheet Row Pulmonary Rehab from 07/15/2016 in [facility] Cardiac and Pulmonary Rehab  Date  06/06/16  Educator  LB  Instruction Review Code  2- meets goals/outcomes      Lung Procedures: - Group verbal and written instruction to describe testing methods done to diagnose lung disease. Review the outcome of test results.  Describe the treatment choices: Pulmonary Function Tests, ABGs and oximetry. Flowsheet Row Pulmonary Rehab from 07/15/2016 in [facility] Cardiac and  Pulmonary Rehab  Date  06/10/16  Educator  Frederich Cha, RT  Instruction Review Code  2- meets goals/outcomes      Energy Conservation: - Provide group verbal and written instruction for methods to conserve energy, plan and organize activities. Instruct on pacing techniques, use of adaptive equipment and posture/positioning to relieve shortness of breath. Flowsheet Row Pulmonary Rehab from 07/15/2016 in [facility] Cardiac and Pulmonary Rehab  Date  06/08/16  Educator  JH/AS  Instruction Review Code  2- meets goals/outcomes      Triggers: - Group verbal and written instruction to review types of environmental controls: home humidity, furnaces, filters, dust mite/pet prevention, HEPA vacuums. To discuss weather changes, air quality and the benefits of nasal washing.   Exacerbations: - Group verbal and written instruction to provide: warning signs, infection symptoms, calling MD promptly, preventive modes, and value of vaccinations. Review: effective airway clearance, coughing and/or vibration techniques. Create an Sports administrator. Flowsheet Row Pulmonary Rehab from 07/15/2016 in [facility] Cardiac and Pulmonary Rehab  Date  05/16/16  Educator  LB  Instruction Review Code  2- meets goals/outcomes      Oxygen: - Individual and group verbal and written instruction on oxygen therapy. Includes supplement oxygen, available portable oxygen systems, continuous and intermittent flow rates, oxygen safety, concentrators, and Medicare reimbursement for oxygen.   Respiratory Medications: - Group verbal and written instruction to review medications for lung disease. Drug class, frequency, complications, importance of spacers, rinsing mouth after steroid MDI's, and proper cleaning methods for nebulizers.   AED/CPR: - Group verbal and written instruction with the use of models to demonstrate the basic use of the AED with the basic ABC's of resuscitation. Flowsheet Row Pulmonary Rehab from 07/15/2016 in [facility]  Cardiac and Pulmonary Rehab  Date  06/24/16  Educator  CE  Instruction Review Code  2- meets goals/outcomes      Breathing Retraining: - Provides individuals verbal and written instruction on purpose, frequency, and proper technique of diaphragmatic breathing and pursed-lipped breathing. Applies individual practice skills.   Anatomy and Physiology of the Lungs: - Group verbal and written instruction with the use of models to provide basic lung anatomy and physiology related to function, structure and complications of lung disease. Flowsheet Row Pulmonary Rehab from 07/15/2016 in [facility] Cardiac and Pulmonary Rehab  Date  07/15/16  Educator  [place]  Instruction Review Code  2- meets goals/outcomes      Heart Failure: - Group verbal and written instruction on the basics of heart failure: signs/symptoms, treatments, explanation of ejection fraction, enlarged heart and cardiomyopathy. Flowsheet Row Pulmonary Rehab from 07/15/2016 in [facility] Cardiac and Pulmonary Rehab  Date  07/08/16 [facility] your numbers]  Educator  CE  Instruction Review Code  2- meets goals/outcomes      Sleep Apnea: - Individual verbal and written instruction to review Obstructive Sleep Apnea. Review of risk factors, methods for diagnosing and types of masks and machines for OSA.   Anxiety: - Provides group, verbal and written instruction on the correlation between heart/lung disease and anxiety, treatment options, and management of anxiety. Flowsheet Row Pulmonary Rehab from 07/15/2016 in [facility] Cardiac and Pulmonary Rehab  Date  06/01/16  Educator  [facility]  Instruction Review Code  2- Meets goals/outcomes      Relaxation: - Provides group, verbal and written instruction about the benefits of relaxation for patients with heart/lung disease. Also  provides patients with examples of relaxation techniques. Flowsheet Row Pulmonary Rehab from 07/15/2016 in [facility] Cardiac and Pulmonary Rehab  Date  06/29/16  Educator  [facility]  Instruction  Review Code  2- Meets goals/outcomes      Knowledge Questionnaire Score:     Knowledge Questionnaire Score - 07/18/16 1510      Knowledge Questionnaire Score   Post Score 10/10       Core Components/Risk Factors/Patient Goals at Admission:   Core Components/Risk Factors/Patient Goals Review:      Goals and Risk Factor Review    Row Name 06/06/16 1542 06/13/16 1152 06/15/16 1231 07/05/16 0727 07/18/16 1514     Core Components/Risk Factors/Patient Goals Review   Personal Goals Review Sedentary;Improve shortness of breath with ADL's;Develop more efficient breathing techniques such as purse lipped breathing and diaphragmatic breathing and practicing self-pacing with activity. Increase Strength and Stamina Improve shortness of breath with ADL's;Increase knowledge of respiratory medications and ability to use respiratory devices properly. Sedentary;Increase Strength and Stamina;Improve shortness of breath with ADL's;Increase knowledge of respiratory medications and ability to use respiratory devices properly.;Develop more efficient breathing techniques such as purse lipped breathing and diaphragmatic breathing and practicing self-pacing with activity. Sedentary;Increase Strength and Stamina;Improve shortness of breath with ADL's;Develop more efficient breathing techniques such as purse lipped breathing and diaphragmatic breathing and practicing self-pacing with activity.;Increase knowledge of respiratory medications and ability to use respiratory devices properly.   Review Ms Guinther increased her Mid26mwd by 171ft. Minimal Importance Difference for COPD is 98.32ft. She states she has increased her energy level and activity at home. She is using PLB with her exercise and activity at home. Coralyn Mark Reported that she was not exercising at home but has been consistantly attending class. She feels much stronger and said she has noticed great improvements in strength and stamina.  Ms Leamy arrived to  [facility] short of breath. Her breath sound were expiratory breath sound and decreased. She was given an SVN with albuterol. She was educated on her nebulizer - explained reservoir tubing, cleaning,. She did take the nebulizer home. Her BBS improved and her shortness of breath improved as well. Ms Holdman was able to complete her treadmill goal.     Ms Hahm states she is doing so much better, both physically and mentally. She is now working in the garden with her flowers - pacing herself with PLB and using her inhalers as ordered for her shortness of breath. She is looking into [employer], since her graduation is soon. Ms Gongora will be graduating in 2 more sessions. She improved her post 20mwd by 290ft - Minimal Importance Difference for COPD is 98.104ft. She is looking forward to continued exercise in [employer] and is very active with her garden and flowers. She has a good understnading of her MDI's and shortness of breath - using PLB and pacing. She states she has improved her life tremendously after participating in [place].   Expected Outcomes  - Coralyn Mark will maintain consistant attendance in class and use breathing techniques to manage SOB in order to be able to continue progression with her workloads.   -  - Continue exercising and being active and continue managing her COPD with proper MDI techniques, PLB, early treatment of an exacerbation, and learning more about COPD through education material.   [place] Name 07/18/16 1523             Core Components/Risk Factors/Patient Goals Review   Review Ms Dorfman will be graduating in 2 more  sessions. She improved her post 34mwd by 279ft - Minimal Importance Difference for COPD is 98.58ft. She is looking forward to continued exercise in [employer] and is very active with her garden and flowers. She has a good understanding of her MDI's and shortness of breath - using PLB and pacing. She states she has improved her life tremendously after participating in  [place].          Core Components/Risk Factors/Patient Goals at Discharge (Final Review):      Goals and Risk Factor Review - 07/18/16 1523      Core Components/Risk Factors/Patient Goals Review   Review Ms Kirkeby will be graduating in 2 more sessions. She improved her post 66mwd by 254ft - Minimal Importance Difference for COPD is 98.37ft. She is looking forward to continued exercise in [employer] and is very active with her garden and flowers. She has a good understanding of her MDI's and shortness of breath - using PLB and pacing. She states she has improved her life tremendously after participating in [place].      ITP Comments:     ITP Comments    Row Name 06/22/16 1201 07/18/16 1536         ITP Comments Face to face meeting with Dr Elta Guadeloupe MIller/Program Medical Director completed today Face to face meeting with Dr Emily Filbert, Medical Director of [place] [facility], completed today before discharge.         Comments: 30 Day Review

## 2016-10-12 ENCOUNTER — Encounter: Payer: Self-pay | Admitting: *Deleted

## 2016-10-25 NOTE — H&P (Signed)
See scanned note.

## 2016-10-26 ENCOUNTER — Ambulatory Visit
Admission: RE | Admit: 2016-10-26 | Discharge: 2016-10-26 | Disposition: A | Discharge status: Home / Self Care | Payer: Medicare Other | Source: Ambulatory Visit | Attending: Ophthalmology | Admitting: Ophthalmology

## 2016-10-26 ENCOUNTER — Encounter: Payer: Self-pay | Admitting: *Deleted

## 2016-10-26 ENCOUNTER — Ambulatory Visit: Payer: Medicare Other | Admitting: Anesthesiology

## 2016-10-26 ENCOUNTER — Encounter
Admission: RE | Disposition: A | Discharge status: Home / Self Care | Payer: Self-pay | Source: Ambulatory Visit | Attending: Ophthalmology

## 2016-10-26 DIAGNOSIS — J449 Chronic obstructive pulmonary disease, unspecified: Secondary | ICD-10-CM | POA: Diagnosis not present

## 2016-10-26 DIAGNOSIS — H2511 Age-related nuclear cataract, right eye: Secondary | ICD-10-CM | POA: Diagnosis present

## 2016-10-26 DIAGNOSIS — I10 Essential (primary) hypertension: Secondary | ICD-10-CM | POA: Diagnosis not present

## 2016-10-26 DIAGNOSIS — Z79899 Other long term (current) drug therapy: Secondary | ICD-10-CM | POA: Diagnosis not present

## 2016-10-26 HISTORY — DX: Bronchiectasis, uncomplicated: J47.9

## 2016-10-26 HISTORY — DX: Sjogren syndrome, unspecified: M35.00

## 2016-10-26 HISTORY — DX: Other nonspecific abnormal finding of lung field: R91.8

## 2016-10-26 HISTORY — DX: Unspecified hearing loss, unspecified ear: H91.90

## 2016-10-26 HISTORY — DX: Chronic obstructive pulmonary disease, unspecified: J44.9

## 2016-10-26 HISTORY — DX: Pneumonia, unspecified organism: J18.9

## 2016-10-26 HISTORY — DX: Essential (primary) hypertension: I10

## 2016-10-26 HISTORY — PX: CATARACT EXTRACTION W/PHACO: SHX586

## 2016-10-26 SURGERY — PHACOEMULSIFICATION, CATARACT, WITH IOL INSERTION
Anesthesia: General | Site: Eye | Laterality: Right | Wound class: Clean

## 2016-10-26 MED ORDER — PHENYLEPHRINE HCL 10 % OP SOLN
OPHTHALMIC | Status: AC
  Administered 2016-10-26: 1 [drp] via OPHTHALMIC
  Filled 2016-10-26: qty 5

## 2016-10-26 MED ORDER — ONDANSETRON HCL 4 MG/2ML IJ SOLN: 4.0000 mg | Freq: Once | INTRAMUSCULAR | Status: DC | PRN

## 2016-10-26 MED ORDER — TETRACAINE HCL 0.5 % OP SOLN
OPHTHALMIC | Status: DC | PRN
  Administered 2016-10-26: 1 [drp] via OPHTHALMIC

## 2016-10-26 MED ORDER — SODIUM CHLORIDE 0.9 % IV SOLN
INTRAVENOUS | Status: DC
  Administered 2016-10-26: 07:00:00 via INTRAVENOUS

## 2016-10-26 MED ORDER — CYCLOPENTOLATE HCL 2 % OP SOLN
1.0000 [drp] | OPHTHALMIC | Status: AC
  Administered 2016-10-26: 1 [drp] via OPHTHALMIC

## 2016-10-26 MED ORDER — TETRACAINE HCL 0.5 % OP SOLN
OPHTHALMIC | Status: AC
  Filled 2016-10-26: qty 2

## 2016-10-26 MED ORDER — CEFUROXIME OPHTHALMIC INJECTION 1 MG/0.1 ML
INJECTION | OPHTHALMIC | Status: AC
  Filled 2016-10-26: qty 0.1

## 2016-10-26 MED ORDER — LIDOCAINE HCL (PF) 4 % IJ SOLN
INTRAMUSCULAR | Status: AC
  Filled 2016-10-26: qty 5

## 2016-10-26 MED ORDER — BUPIVACAINE HCL (PF) 0.75 % IJ SOLN
INTRAMUSCULAR | Status: AC
  Filled 2016-10-26: qty 10

## 2016-10-26 MED ORDER — MIDAZOLAM HCL 2 MG/2ML IJ SOLN
INTRAMUSCULAR | Status: DC | PRN
  Administered 2016-10-26: 1 mg via INTRAVENOUS

## 2016-10-26 MED ORDER — PHENYLEPHRINE HCL 10 % OP SOLN
1.0000 [drp] | OPHTHALMIC | Status: AC
  Administered 2016-10-26: 1 [drp] via OPHTHALMIC

## 2016-10-26 MED ORDER — EPINEPHRINE PF 1 MG/ML IJ SOLN
INTRAMUSCULAR | Status: AC
  Filled 2016-10-26: qty 2

## 2016-10-26 MED ORDER — CARBACHOL 0.01 % IO SOLN
INTRAOCULAR | Status: DC | PRN
  Administered 2016-10-26: 0.5 mL via INTRAOCULAR

## 2016-10-26 MED ORDER — LIDOCAINE HCL (PF) 4 % IJ SOLN
INTRAMUSCULAR | Status: DC | PRN
  Administered 2016-10-26: 9 mL via OPHTHALMIC

## 2016-10-26 MED ORDER — FENTANYL CITRATE (PF) 100 MCG/2ML IJ SOLN: 25.0000 ug | INTRAMUSCULAR | Status: DC | PRN

## 2016-10-26 MED ORDER — MOXIFLOXACIN HCL 0.5 % OP SOLN
1.0000 [drp] | OPHTHALMIC | Status: AC
  Administered 2016-10-26: 1 [drp] via OPHTHALMIC

## 2016-10-26 MED ORDER — CYCLOPENTOLATE HCL 2 % OP SOLN
OPHTHALMIC | Status: AC
  Administered 2016-10-26: 1 [drp] via OPHTHALMIC
  Filled 2016-10-26: qty 2

## 2016-10-26 MED ORDER — POVIDONE-IODINE 5 % OP SOLN
OPHTHALMIC | Status: DC | PRN
  Administered 2016-10-26: 1 via OPHTHALMIC

## 2016-10-26 MED ORDER — HYALURONIDASE HUMAN 150 UNIT/ML IJ SOLN
INTRAMUSCULAR | Status: AC
  Filled 2016-10-26: qty 1

## 2016-10-26 MED ORDER — CEFUROXIME OPHTHALMIC INJECTION 1 MG/0.1 ML
INJECTION | OPHTHALMIC | Status: DC | PRN
  Administered 2016-10-26: 0.1 mL via INTRACAMERAL

## 2016-10-26 MED ORDER — MOXIFLOXACIN HCL 0.5 % OP SOLN
OPHTHALMIC | Status: DC | PRN
  Administered 2016-10-26: 1 [drp] via OPHTHALMIC

## 2016-10-26 MED ORDER — ALFENTANIL 500 MCG/ML IJ INJ
INJECTION | INTRAMUSCULAR | Status: DC | PRN
  Administered 2016-10-26: 500 ug via INTRAVENOUS

## 2016-10-26 MED ORDER — LIDOCAINE HCL (PF) 4 % IJ SOLN
INTRAMUSCULAR | Status: DC | PRN
  Administered 2016-10-26: 4 mL via OPHTHALMIC

## 2016-10-26 MED ORDER — NA CHONDROIT SULF-NA HYALURON 40-17 MG/ML IO SOLN
INTRAOCULAR | Status: AC
  Filled 2016-10-26: qty 1

## 2016-10-26 MED ORDER — EPINEPHRINE PF 1 MG/ML IJ SOLN
INTRAOCULAR | Status: DC | PRN
  Administered 2016-10-26: 1 mL via OPHTHALMIC

## 2016-10-26 MED ORDER — POVIDONE-IODINE 5 % OP SOLN
OPHTHALMIC | Status: AC
  Filled 2016-10-26: qty 30

## 2016-10-26 MED ORDER — ONDANSETRON HCL 4 MG/2ML IJ SOLN
INTRAMUSCULAR | Status: DC | PRN
  Administered 2016-10-26: 4 mg via INTRAVENOUS

## 2016-10-26 MED ORDER — MOXIFLOXACIN HCL 0.5 % OP SOLN
OPHTHALMIC | Status: AC
  Administered 2016-10-26: 1 [drp] via OPHTHALMIC
  Filled 2016-10-26: qty 3

## 2016-10-26 MED ORDER — NA CHONDROIT SULF-NA HYALURON 40-17 MG/ML IO SOLN
INTRAOCULAR | Status: DC | PRN
  Administered 2016-10-26: 1 mL via INTRAOCULAR

## 2016-10-26 MED ORDER — PROPARACAINE HCL 0.5 % OP SOLN
OPHTHALMIC | Status: AC
  Filled 2016-10-26: qty 15

## 2016-10-26 SURGICAL SUPPLY — 30 items
CANNULA ANT/CHMB 27GA (MISCELLANEOUS) ×2 IMPLANT
CORD BIP STRL DISP 12FT (MISCELLANEOUS) ×2 IMPLANT
CUP MEDICINE 2OZ PLAST GRAD ST (MISCELLANEOUS) ×2 IMPLANT
DRAPE XRAY CASSETTE 23X24 (DRAPES) ×2 IMPLANT
ERASER HMR WETFIELD 18G (MISCELLANEOUS) ×2 IMPLANT
GLOVE BIO SURGEON STRL SZ8 (GLOVE) ×2 IMPLANT
GLOVE SURG LX 6.5 MICRO (GLOVE) ×1
GLOVE SURG LX 8.0 MICRO (GLOVE) ×1
GLOVE SURG LX STRL 6.5 MICRO (GLOVE) ×1 IMPLANT
GLOVE SURG LX STRL 8.0 MICRO (GLOVE) ×1 IMPLANT
GOWN STRL REUS W/ TWL LRG LVL3 (GOWN DISPOSABLE) ×1 IMPLANT
GOWN STRL REUS W/ TWL XL LVL3 (GOWN DISPOSABLE) ×1 IMPLANT
GOWN STRL REUS W/TWL LRG LVL3 (GOWN DISPOSABLE) ×1
GOWN STRL REUS W/TWL XL LVL3 (GOWN DISPOSABLE) ×1
LENS IOL ACRYSOF IQ 22.5 (Intraocular Lens) ×2 IMPLANT
PACK CATARACT (MISCELLANEOUS) ×2 IMPLANT
PACK CATARACT DINGLEDEIN LX (MISCELLANEOUS) ×2 IMPLANT
PACK EYE AFTER SURG (MISCELLANEOUS) ×2 IMPLANT
SHLD EYE VISITEC  UNIV (MISCELLANEOUS) ×2 IMPLANT
SOL BSS BAG (MISCELLANEOUS) ×2
SOL PREP PVP 2OZ (MISCELLANEOUS) ×2
SOLUTION BSS BAG (MISCELLANEOUS) ×1 IMPLANT
SOLUTION PREP PVP 2OZ (MISCELLANEOUS) ×1 IMPLANT
SUT ETHILON 10 0 CS140 6 (SUTURE) ×2 IMPLANT
SUT SILK 5-0 (SUTURE) ×2 IMPLANT
SYR 3ML LL SCALE MARK (SYRINGE) ×2 IMPLANT
SYR 5ML LL (SYRINGE) ×2 IMPLANT
SYR TB 1ML 27GX1/2 LL (SYRINGE) ×2 IMPLANT
WATER STERILE IRR 250ML POUR (IV SOLUTION) ×2 IMPLANT
WIPE NON LINTING 3.25X3.25 (MISCELLANEOUS) ×2 IMPLANT

## 2016-10-26 NOTE — Interval H&P Note (Signed)
History and Physical Interval Note:  10/26/2016 7:22 AM  Breanna Christian  has presented today for surgery, with the diagnosis of NUCLEARN SCLEROTIC CATARACT RIGHT EYE  The various methods of treatment have been discussed with the patient and family. After consideration of risks, benefits and other options for treatment, the patient has consented to  Procedure(s) with comments: CATARACT EXTRACTION PHACO AND INTRAOCULAR LENS PLACEMENT (IOC) (Right) - [place] AP% CDE Fluid pack lot # [id number] as a surgical intervention .  The patient's history has been reviewed, patient examined, no change in status, stable for surgery.  I have reviewed the patient's chart and labs.  Questions were answered to the patient's satisfaction.     Kamuela Magos

## 2016-10-26 NOTE — Anesthesia Preprocedure Evaluation (Signed)
Anesthesia Evaluation  Patient identified by MRN, date of birth, ID band Patient awake    Reviewed: Allergy & Precautions, NPO status , Patient's Chart, lab work & pertinent test results  Airway Mallampati: II       Dental  (+) Upper Dentures, Lower Dentures   Pulmonary COPD,    breath sounds clear to auscultation       Cardiovascular Exercise Tolerance: Good hypertension, Pt. on medications  Rhythm:Regular Rate:Normal     Neuro/Psych negative neurological ROS     GI/Hepatic negative GI ROS, Neg liver ROS,   Endo/Other  negative endocrine ROS  Renal/GU negative Renal ROS     Musculoskeletal   Abdominal Normal abdominal exam  (+)   Peds  Hematology negative hematology ROS (+)   Anesthesia Other Findings   Reproductive/Obstetrics                             Anesthesia Physical Anesthesia Plan  ASA: II  Anesthesia Plan: General   Post-op Pain Management:    Induction: Intravenous  Airway Management Planned: Natural Airway and Nasal Cannula  Additional Equipment:   Intra-op Plan:   Post-operative Plan:   Informed Consent: I have reviewed the patients History and Physical, chart, labs and discussed the procedure including the risks, benefits and alternatives for the proposed anesthesia with the patient or authorized representative who has indicated his/her understanding and acceptance.     Plan Discussed with: CRNA  Anesthesia Plan Comments:         Anesthesia Quick Evaluation

## 2016-10-26 NOTE — Anesthesia Postprocedure Evaluation (Signed)
Anesthesia Post Note  Patient: Breanna Christian  Procedure(s) Performed: Procedure(s) (LRB): CATARACT EXTRACTION PHACO AND INTRAOCULAR LENS PLACEMENT (IOC) (Right)  Patient location during evaluation: PACU Anesthesia Type: General Level of consciousness: awake Pain management: satisfactory to patient Vital Signs Assessment: post-procedure vital signs reviewed and stable Respiratory status: spontaneous breathing Cardiovascular status: stable Anesthetic complications: no    Last Vitals:  Vitals:   10/26/16 0658 10/26/16 0827  BP: 110/73 115/69  Pulse: (!) 104 86  Resp: 16 16  Temp: 36.6 C 36.9 C    Last Pain:  Vitals:   10/26/16 0658  TempSrc: Oral                 VAN STAVEREN,Lynell Greenhouse

## 2016-10-26 NOTE — Anesthesia Procedure Notes (Signed)
Procedure Name: MAC Date/Time: 10/26/2016 7:45 AM Performed by: Doreen Salvage Pre-anesthesia Checklist: Patient identified, Emergency Drugs available, Suction available and Patient being monitored Patient Re-evaluated:Patient Re-evaluated prior to inductionOxygen Delivery Method: Nasal cannula

## 2016-10-26 NOTE — Discharge Instructions (Signed)
Eye Surgery Discharge Instructions  Expect mild scratchy sensation or mild soreness. DO NOT RUB YOUR EYE!  The day of surgery:  Minimal physical activity, but bed rest is not required  No reading, computer work, or close hand work  No bending, lifting, or straining.  May watch TV  For 24 hours:  No driving, legal decisions, or alcoholic beverages  Safety precautions  Eat anything you prefer: It is better to start with liquids, then soup then solid foods.  _____ Eye patch should be worn until postoperative exam tomorrow.  ____ Solar shield eyeglasses should be worn for comfort in the sunlight/patch while sleeping  Resume all regular medications including aspirin or Coumadin if these were discontinued prior to surgery. You may shower, bathe, shave, or wash your hair. Tylenol may be taken for mild discomfort.  Call your doctor if you experience significant pain, nausea, or vomiting, fever > 101 or other signs of infection. [phone] or [phone] Specific instructions:  Follow-up Information    Aviona Martenson, MD Follow up.   Specialty:  Ophthalmology Why:  December 7 at 10:30am Contact information: [address]   [place] [address] [phone]

## 2016-10-26 NOTE — Transfer of Care (Signed)
Immediate Anesthesia Transfer of Care Note  Patient: Breanna Christian  Procedure(s) Performed: Procedure(s) with comments: CATARACT EXTRACTION PHACO AND INTRAOCULAR LENS PLACEMENT (IOC) (Right) - [place] 2.17 AP% 26.5 CDE 56.86 Fluid pack lot # [id number]  Patient Location: PHASE II  Anesthesia Type:MAC  Level of Consciousness: Awake, Alert, Oriented  Airway & Oxygen Therapy: Patient Spontanous Breathing and Patient on room air   Post-op Assessment: Report given to RN and Post -op Vital signs reviewed and stable  Post vital signs: Reviewed and stable  Last Vitals:  Vitals:   10/26/16 0658 10/26/16 0827  BP: 110/73 115/69  Pulse: (!) 104 86  Resp: 16 16  Temp: 36.6 C 123456 C    Complications: No apparent anesthesia complications

## 2016-10-26 NOTE — Op Note (Signed)
Date of Surgery: 10/26/2016 Date of Dictation: 10/26/2016 8:24 AM Pre-operative Diagnosis:  Nuclear Sclerotic Cataract right Eye Post-operative Diagnosis: same Procedure performed: Extra-capsular Cataract Extraction (ECCE) with placement of a posterior chamber intraocular lens (IOL) right Eye IOL:  Implant Name Type Inv. Item Serial No. Manufacturer Lot No. LRB No. Used  LENS IOL ACRYSOF IQ 22.5 - [id number] Intraocular Lens LENS IOL ACRYSOF IQ 22.5 [id number] ALCON   Right 1   Anesthesia: 2% Lidocaine and 4% Marcaine in a 50/50 mixture with 10 unites/ml of Hylenex given as a peribulbar Anesthesiologist: Anesthesiologist: Gijsbertus F [place] Service, MD CRNA: Doreen Salvage, CRNA; Johnna Acosta, CRNA Complications: none Estimated Blood Loss: less than 1 ml  Description of procedure:  The patient was given anesthesia and sedation via intravenous access. The patient was then prepped and draped in the usual fashion. A 25-gauge needle was bent for initiating the capsulorhexis. A 5-0 silk suture was placed through the conjunctiva superior and inferiorly to serve as bridle sutures. Hemostasis was obtained at the superior limbus using an eraser cautery. A partial thickness groove was made at the anterior surgical limbus with a 64 Beaver blade and this was dissected anteriorly with an [employer]. The anterior chamber was entered at 10 o'clock with a 1.0 mm paracentesis knife and through the lamellar dissection with a 2.6 mm Alcon keratome. Epi-Shugarcaine 0.5 CC [9 cc BSS Plus (Alcon), 3 cc 4% preservative-free lidocaine (Hospira) and 4 cc 1:1000 preservative-free, bisulfite-free epinephrine] was injected into the anterior chamber via the paracentesis tract. Epi-Shugarcaine 0.5 CC [9 cc BSS Plus (Alcon), 3 cc 4% preservative-free lidocaine (Hospira) and 4 cc 1:1000 preservative-free, bisulfite-free epinephrine] was injected into the anterior chamber via the paracentesis tract. DiscoVisc was  injected to replace the aqueous and a continuous tear curvilinear capsulorhexis was performed using a bent 25-gauge needle.  Balance salt on a syringe was used to perform hydro-dissection and phacoemulsification was carried out using a divide and conquer technique. Procedure(s) with comments: CATARACT EXTRACTION PHACO AND INTRAOCULAR LENS PLACEMENT (IOC) (Right) - [place] 2.17 AP% 26.5 CDE 56.86 Fluid pack lot # [id number]. Irrigation/aspiration was used to remove the residual cortex and the capsular bag was inflated with DiscoVisc. The intraocular lens was inserted into the capsular bag using a pre-loaded UltraSert Delivery System. Irrigation/aspiration was used to remove the residual DiscoVisc. The wound was inflated with balanced salt and checked for leaks. None were found. Miostat was injected via the paracentesis track and 0.1 ml of cefuroxime containing 1 mg of drug  was injected via the paracentesis track. The wound was checked for leaks again and none were found.   The bridal sutures were removed and two drops of Vigamox were placed on the eye. An eye shield was placed to protect the eye and the patient was discharged to the recovery area in good condition.   Shanyla Marconi MD

## 2017-01-19 DIAGNOSIS — E538 Deficiency of other specified B group vitamins: Secondary | ICD-10-CM | POA: Diagnosis not present

## 2017-01-19 DIAGNOSIS — R634 Abnormal weight loss: Secondary | ICD-10-CM | POA: Diagnosis not present

## 2017-01-19 DIAGNOSIS — M3500 Sicca syndrome, unspecified: Secondary | ICD-10-CM | POA: Diagnosis not present

## 2017-01-19 DIAGNOSIS — M0560 Rheumatoid arthritis of unspecified site with involvement of other organs and systems: Secondary | ICD-10-CM | POA: Diagnosis not present

## 2017-01-19 DIAGNOSIS — E559 Vitamin D deficiency, unspecified: Secondary | ICD-10-CM | POA: Diagnosis not present

## 2017-01-19 DIAGNOSIS — I10 Essential (primary) hypertension: Secondary | ICD-10-CM | POA: Diagnosis not present

## 2017-01-19 DIAGNOSIS — E782 Mixed hyperlipidemia: Secondary | ICD-10-CM | POA: Diagnosis not present

## 2017-01-19 DIAGNOSIS — D72829 Elevated white blood cell count, unspecified: Secondary | ICD-10-CM | POA: Diagnosis not present

## 2017-01-19 DIAGNOSIS — C8332 Diffuse large B-cell lymphoma, intrathoracic lymph nodes: Secondary | ICD-10-CM | POA: Diagnosis not present

## 2017-01-25 DIAGNOSIS — E876 Hypokalemia: Secondary | ICD-10-CM | POA: Diagnosis not present

## 2017-01-25 DIAGNOSIS — M3500 Sicca syndrome, unspecified: Secondary | ICD-10-CM | POA: Diagnosis not present

## 2017-01-25 DIAGNOSIS — C8332 Diffuse large B-cell lymphoma, intrathoracic lymph nodes: Secondary | ICD-10-CM | POA: Diagnosis not present

## 2017-01-25 DIAGNOSIS — D529 Folate deficiency anemia, unspecified: Secondary | ICD-10-CM | POA: Diagnosis not present

## 2017-01-25 DIAGNOSIS — J441 Chronic obstructive pulmonary disease with (acute) exacerbation: Secondary | ICD-10-CM | POA: Diagnosis not present

## 2017-01-25 DIAGNOSIS — I10 Essential (primary) hypertension: Secondary | ICD-10-CM | POA: Diagnosis not present

## 2017-01-25 DIAGNOSIS — E782 Mixed hyperlipidemia: Secondary | ICD-10-CM | POA: Diagnosis not present

## 2017-01-25 DIAGNOSIS — M0560 Rheumatoid arthritis of unspecified site with involvement of other organs and systems: Secondary | ICD-10-CM | POA: Diagnosis not present

## 2017-01-25 DIAGNOSIS — J84112 Idiopathic pulmonary fibrosis: Secondary | ICD-10-CM | POA: Diagnosis not present

## 2017-01-25 DIAGNOSIS — R0789 Other chest pain: Secondary | ICD-10-CM | POA: Diagnosis not present

## 2017-01-25 DIAGNOSIS — D72829 Elevated white blood cell count, unspecified: Secondary | ICD-10-CM | POA: Diagnosis not present

## 2017-02-20 DIAGNOSIS — J449 Chronic obstructive pulmonary disease, unspecified: Secondary | ICD-10-CM | POA: Diagnosis not present

## 2017-02-20 DIAGNOSIS — A310 Pulmonary mycobacterial infection: Secondary | ICD-10-CM | POA: Diagnosis not present

## 2017-02-20 DIAGNOSIS — R0602 Shortness of breath: Secondary | ICD-10-CM | POA: Diagnosis not present

## 2017-03-03 ENCOUNTER — Ambulatory Visit
Admission: RE | Admit: 2017-03-03 | Discharge: 2017-03-03 | Disposition: A | Discharge status: Home / Self Care | Payer: Medicare Other | Source: Ambulatory Visit | Attending: Internal Medicine | Admitting: Internal Medicine

## 2017-03-03 ENCOUNTER — Encounter
Admission: RE | Disposition: A | Discharge status: Home / Self Care | Payer: Self-pay | Source: Ambulatory Visit | Attending: Internal Medicine

## 2017-03-03 DIAGNOSIS — J449 Chronic obstructive pulmonary disease, unspecified: Secondary | ICD-10-CM | POA: Insufficient documentation

## 2017-03-03 DIAGNOSIS — R042 Hemoptysis: Secondary | ICD-10-CM | POA: Insufficient documentation

## 2017-03-03 DIAGNOSIS — M069 Rheumatoid arthritis, unspecified: Secondary | ICD-10-CM | POA: Insufficient documentation

## 2017-03-03 DIAGNOSIS — Z888 Allergy status to other drugs, medicaments and biological substances status: Secondary | ICD-10-CM | POA: Diagnosis not present

## 2017-03-03 DIAGNOSIS — R05 Cough: Secondary | ICD-10-CM | POA: Diagnosis not present

## 2017-03-03 DIAGNOSIS — Z8572 Personal history of non-Hodgkin lymphomas: Secondary | ICD-10-CM | POA: Insufficient documentation

## 2017-03-03 DIAGNOSIS — Z79899 Other long term (current) drug therapy: Secondary | ICD-10-CM | POA: Diagnosis not present

## 2017-03-03 DIAGNOSIS — R918 Other nonspecific abnormal finding of lung field: Secondary | ICD-10-CM | POA: Diagnosis not present

## 2017-03-03 DIAGNOSIS — J849 Interstitial pulmonary disease, unspecified: Secondary | ICD-10-CM | POA: Diagnosis not present

## 2017-03-03 DIAGNOSIS — Z7951 Long term (current) use of inhaled steroids: Secondary | ICD-10-CM | POA: Diagnosis not present

## 2017-03-03 HISTORY — PX: FLEXIBLE BRONCHOSCOPY: SHX5094

## 2017-03-03 SURGERY — BRONCHOSCOPY, FLEXIBLE
Anesthesia: Moderate Sedation

## 2017-03-03 MED ORDER — FENTANYL CITRATE (PF) 100 MCG/2ML IJ SOLN
INTRAMUSCULAR | Status: AC | PRN
  Administered 2017-03-03: 50 ug via INTRAVENOUS

## 2017-03-03 MED ORDER — FENTANYL CITRATE (PF) 100 MCG/2ML IJ SOLN
INTRAMUSCULAR | Status: AC
  Filled 2017-03-03: qty 4

## 2017-03-03 MED ORDER — MIDAZOLAM HCL 5 MG/5ML IJ SOLN
INTRAMUSCULAR | Status: AC | PRN
  Administered 2017-03-03: 2 mg via INTRAVENOUS

## 2017-03-03 MED ORDER — PHENYLEPHRINE HCL 0.25 % NA SOLN
1.0000 | Freq: Four times a day (QID) | NASAL | Status: DC | PRN
  Filled 2017-03-03: qty 15

## 2017-03-03 MED ORDER — MIDAZOLAM HCL 5 MG/5ML IJ SOLN
INTRAMUSCULAR | Status: AC
  Filled 2017-03-03: qty 10

## 2017-03-03 MED ORDER — SODIUM CHLORIDE 0.45 % IV SOLN: Freq: Once | INTRAVENOUS | Status: DC

## 2017-03-03 MED ORDER — LIDOCAINE HCL 2 % EX GEL: 1.0000 "application " | Freq: Once | CUTANEOUS | Status: DC

## 2017-03-03 MED ORDER — BUTAMBEN-TETRACAINE-BENZOCAINE 2-2-14 % EX AERO
1.0000 | INHALATION_SPRAY | Freq: Once | CUTANEOUS | Status: DC
  Filled 2017-03-03: qty 20

## 2017-03-03 NOTE — Op Note (Signed)
[facility] Patient Name: Breanna Christian Procedure Date: 03/03/2017 11:39 AM MRN: [id number] Account #: [id number] Date of Birth: 1941/04/28 Admit Type: Outpatient Age: 76 Room: [facility] PROCEDURE RM 01 Gender: Female Note Status: Finalized Attending MD: Allyne Gee , MD Procedure:         Bronchoscopy Indications:       Hemoptysis with abnormal CXR, Interstitial lung disease,                     Chronic cough Providers:         Allyne Gee, MD Referring MD:       Medicines:         Midazolam 2 mg IV, Fentanyl 50 mcg IV Complications:     No immediate complications Procedure:         Pre-Anesthesia Assessment:                    - A History and Physical has been performed. The patient's                     medications, allergies and sensitivities have been                     reviewed.                    - The risks and benefits of the procedure and the sedation                     options and risks were discussed with the patient. All                     questions were answered and informed consent was obtained.                    - Pre-procedure physical examination revealed no                     contraindications to sedation.                    - ASA Grade Assessment: II - A patient with mild systemic                     disease.                    - After reviewing the risks and benefits, the patient was                     deemed in satisfactory condition to undergo the procedure.                    - The anesthesia plan was to use moderate                     sedation/analgesia.                    - Immediately prior to administration of medications, the                     patient was re-assessed for adequacy to receive sedatives.                    - The heart rate, respiratory rate, oxygen saturations,  blood pressure, adequacy of pulmonary ventilation, and                     response to care were monitored throughout the  procedure.                    - The physical status of the patient was re-assessed after                     the procedure.                    After obtaining informed consent, the bronchoscope was                     passed under direct vision. Throughout the procedure, the                     patient's blood pressure, pulse, and oxygen saturations                     were monitored continuously. the Bronchoscope was                     introduced through the right nostril and advanced to the                     tracheobronchial tree of both lungs. The procedure was                     accomplished without difficulty. The patient tolerated the                     procedure well. The total duration of the procedure was 10                     minutes. Findings:      The endotracheal tube is in good position. The trachea is of normal       caliber. The carina is sharp. The tracheobronchial tree was examined to       at least the first subsegmental level. Bronchial mucosa and anatomy are       normal; there are no endobronchial lesions, and no secretions.      Bronchoalveolar lavage was performed in the RML lateral segment (B4) and       in the RLL superior segment (B6) of the lung and sent for cell count,       bacterial culture, viral smears & culture, and fungal & AFB analysis. 90       mL of fluid were instilled. 50 mL were returned. The return was       cellular. There were no mucoid plugs in the return fluid. Multiple       specimens were obtained, and each sent for analysis.      Verification of patient identification for the specimen was done by the       physician and nurse using the patient's name, birth date and medical       record number. Impression:        - Hemoptysis with abnormal CXR                    - Interstitial lung disease                    - Chronic cough                    -  The examination was normal.                    - Bronchoalveolar lavage was performed.                     - The examination was normal. Recommendation:    - Await BAL results. Devona Konig, MD Allyne Gee, MD 03/03/2017 12:44:20 PM This report has been signed electronically. Number of Addenda: 0 Note Initiated On: 03/03/2017 11:39 AM      [facility]

## 2017-03-03 NOTE — Discharge Instructions (Signed)
Flexible Bronchoscopy, Care After These instructions give you information on caring for yourself after your procedure. Your doctor may also give you more specific instructions. Call your doctor if you have any problems or questions after your procedure. Follow these instructions at home:  Do not eat or drink anything for 2 hours after your procedure. If you try to eat or drink before the medicine wears off, food or drink could go into your lungs. You could also burn yourself.  After 2 hours have passed and when you can cough and gag normally, you may eat soft food and drink liquids slowly.  The day after the test, you may eat your normal diet.  You may do your normal activities.  Keep all doctor visits. Get help right away if:  You get more and more short of breath.  You get light-headed.  You feel like you are going to pass out (faint).  You have chest pain.  You have new problems that worry you.  You cough up more than a little blood.  You cough up more blood than before. This information is not intended to replace advice given to you by your health care provider. Make sure you discuss any questions you have with your health care provider. Document Released: 09/04/2009 Document Revised: 04/14/2016 Document Reviewed: 07/12/2013 Elsevier Interactive Patient Education  2017 [employer].

## 2017-03-04 LAB — ACID FAST SMEAR (AFB, MYCOBACTERIA): Acid Fast Smear: NEGATIVE

## 2017-03-06 ENCOUNTER — Encounter: Payer: Self-pay | Admitting: Internal Medicine

## 2017-03-06 LAB — CULTURE, RESPIRATORY W GRAM STAIN

## 2017-03-06 LAB — CULTURE, RESPIRATORY: Culture: NORMAL

## 2017-03-24 LAB — CULTURE, FUNGUS WITHOUT SMEAR

## 2017-04-03 DIAGNOSIS — J449 Chronic obstructive pulmonary disease, unspecified: Secondary | ICD-10-CM | POA: Diagnosis not present

## 2017-04-03 DIAGNOSIS — A310 Pulmonary mycobacterial infection: Secondary | ICD-10-CM | POA: Diagnosis not present

## 2017-04-03 DIAGNOSIS — R0602 Shortness of breath: Secondary | ICD-10-CM | POA: Diagnosis not present

## 2017-04-06 DIAGNOSIS — I10 Essential (primary) hypertension: Secondary | ICD-10-CM | POA: Diagnosis not present

## 2017-04-06 DIAGNOSIS — R531 Weakness: Secondary | ICD-10-CM | POA: Diagnosis not present

## 2017-04-06 DIAGNOSIS — R000 Tachycardia, unspecified: Secondary | ICD-10-CM | POA: Diagnosis not present

## 2017-04-06 DIAGNOSIS — A310 Pulmonary mycobacterial infection: Secondary | ICD-10-CM | POA: Diagnosis not present

## 2017-04-06 DIAGNOSIS — R0602 Shortness of breath: Secondary | ICD-10-CM | POA: Diagnosis not present

## 2017-04-07 DIAGNOSIS — J849 Interstitial pulmonary disease, unspecified: Secondary | ICD-10-CM | POA: Diagnosis not present

## 2017-04-07 DIAGNOSIS — R918 Other nonspecific abnormal finding of lung field: Secondary | ICD-10-CM | POA: Diagnosis not present

## 2017-04-07 DIAGNOSIS — E782 Mixed hyperlipidemia: Secondary | ICD-10-CM | POA: Diagnosis not present

## 2017-04-07 DIAGNOSIS — R0602 Shortness of breath: Secondary | ICD-10-CM | POA: Diagnosis not present

## 2017-04-07 DIAGNOSIS — I10 Essential (primary) hypertension: Secondary | ICD-10-CM | POA: Diagnosis not present

## 2017-04-17 LAB — ACID FAST CULTURE WITH REFLEXED SENSITIVITIES (MYCOBACTERIA): Acid Fast Culture: NEGATIVE

## 2017-05-01 DIAGNOSIS — J449 Chronic obstructive pulmonary disease, unspecified: Secondary | ICD-10-CM | POA: Diagnosis not present

## 2017-05-01 DIAGNOSIS — R0602 Shortness of breath: Secondary | ICD-10-CM | POA: Diagnosis not present

## 2017-05-01 DIAGNOSIS — A310 Pulmonary mycobacterial infection: Secondary | ICD-10-CM | POA: Diagnosis not present

## 2017-06-08 IMAGING — CT CT CHEST W/O CM
2 of 3 series · 16 of 46 positions shown, 18 images · non-contrast
Comparison: CT chest of 09/27/2010

CLINICAL DATA: Cough for several years, no smoking history, some
shortness of breath

EXAM:
CT CHEST WITHOUT CONTRAST
TECHNIQUE: Multidetector CT imaging of the chest was performed following the
standard protocol without IV contrast.

[Series 2: routine chest wo · axial · 0.68mm/px · z∈[-536,-246]mm · 13 of 68 slices shown, 15 images]
[im 5/68  soft-tissue]
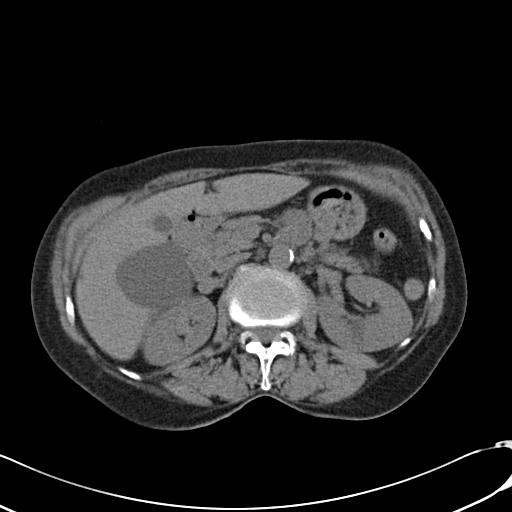
[im 5/68  bone]
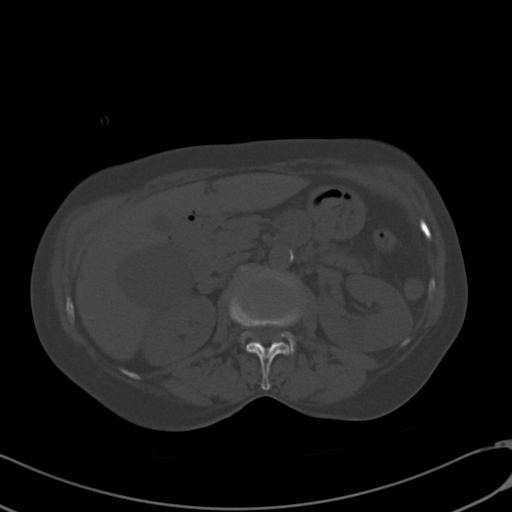
[im 9/68  soft-tissue]
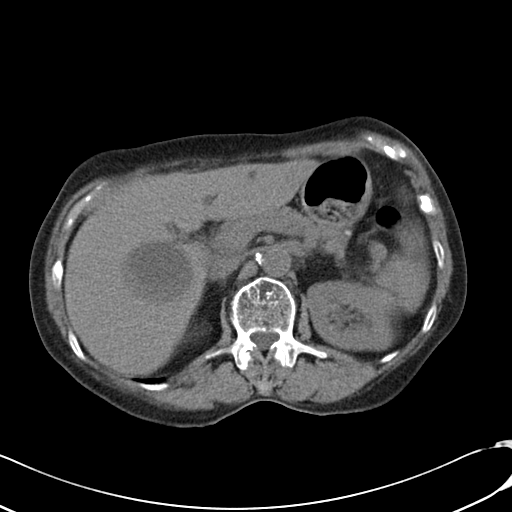
[im 13/68  soft-tissue]
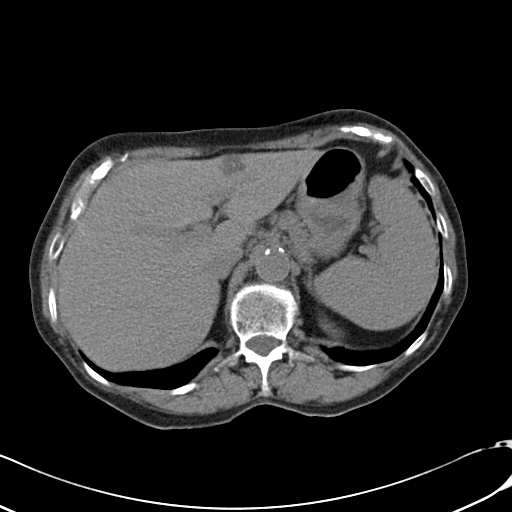
[im 20/68  soft-tissue]
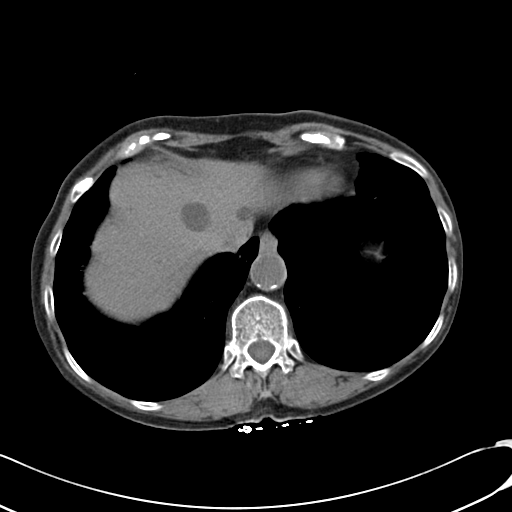
[im 24/68  soft-tissue]
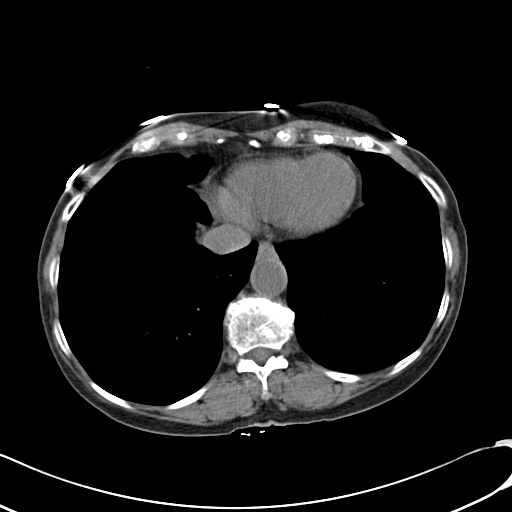
[im 29/68  soft-tissue]
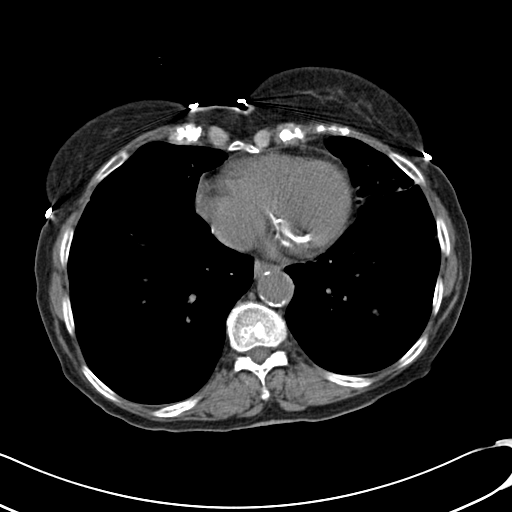
[im 35/68  soft-tissue]
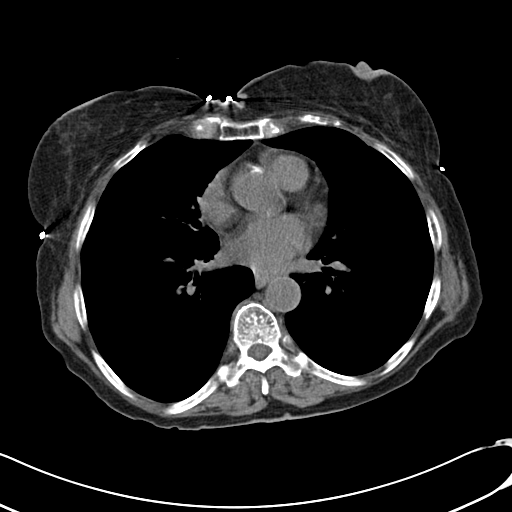
[im 39/68  soft-tissue]
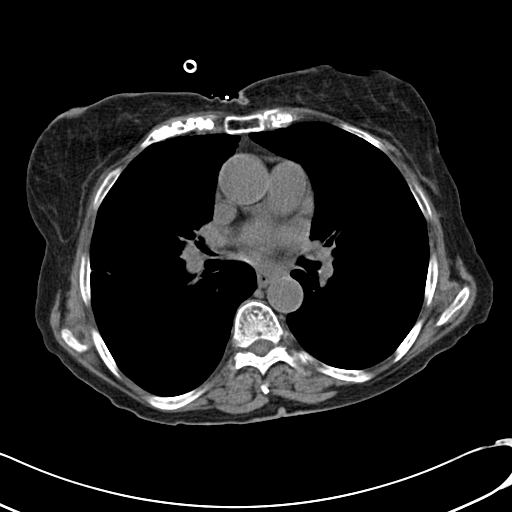
[im 44/68  soft-tissue]
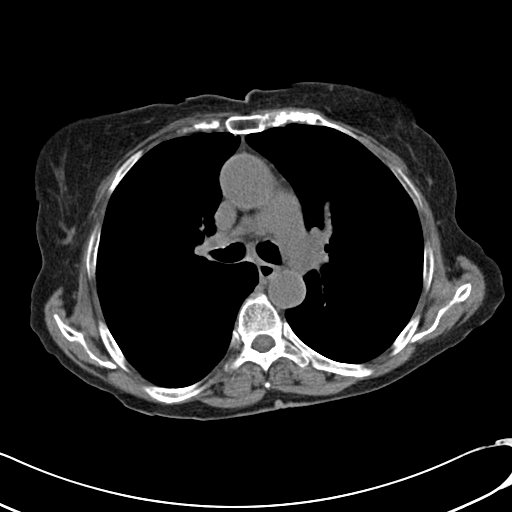
[im 44/68  bone]
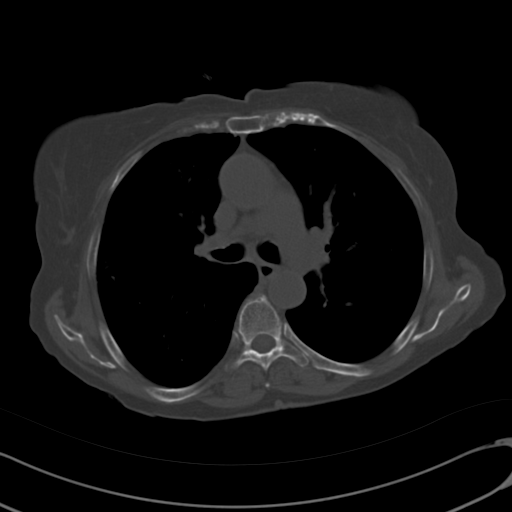
[im 48/68  soft-tissue]
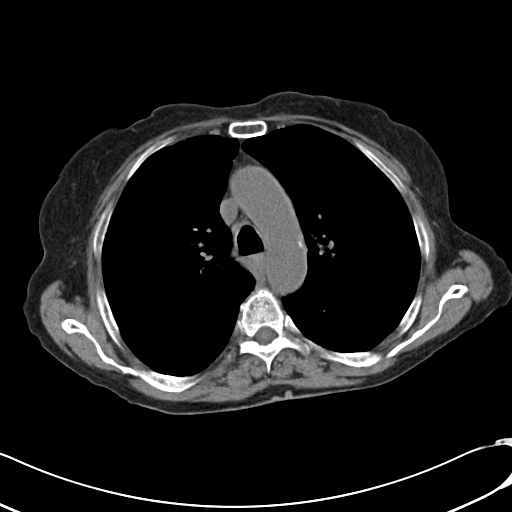
[im 55/68  soft-tissue]
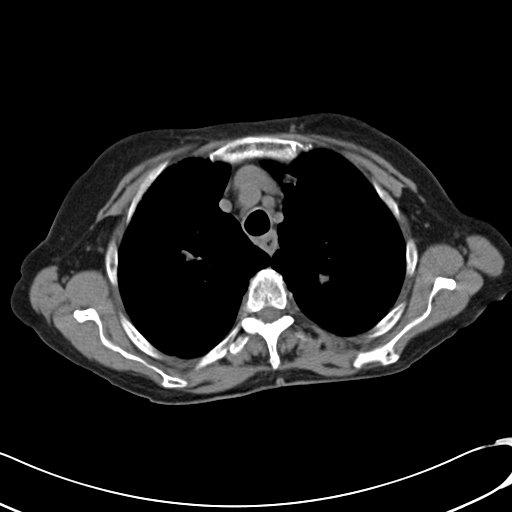
[im 59/68  soft-tissue]
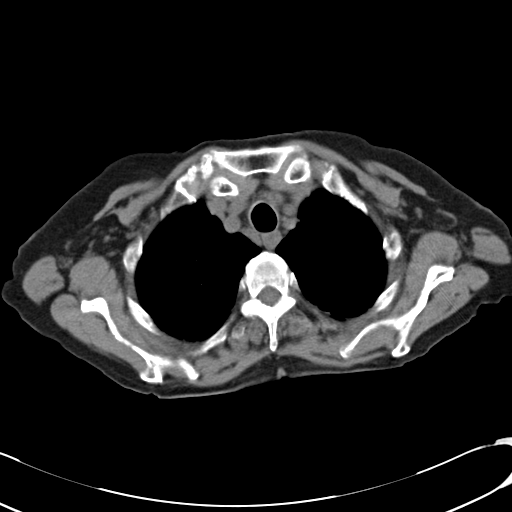
[im 63/68  soft-tissue]
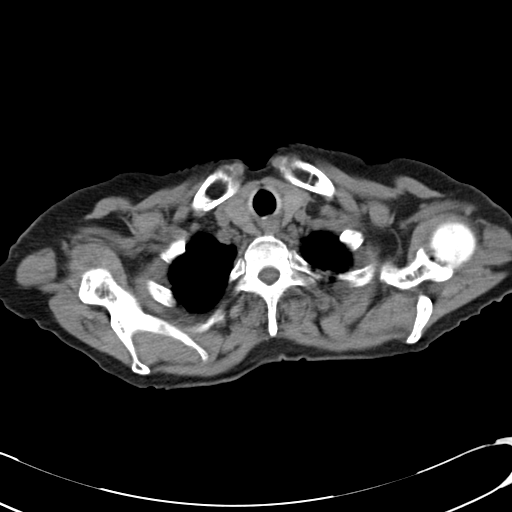

[Series 5: routine chest wo cor · coronal · 0.67mm/px · 3 of 134 slices shown]
[im 45/134  soft-tissue]
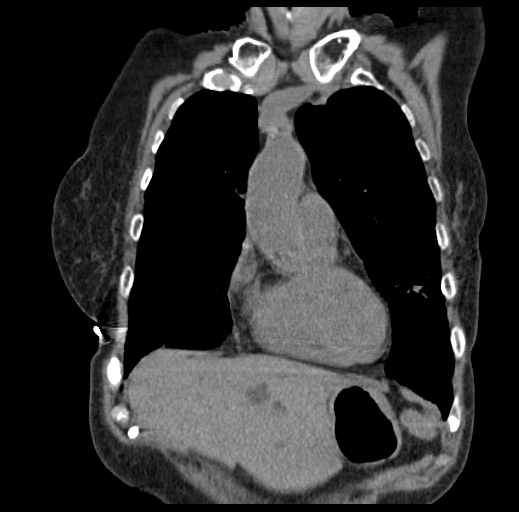
[im 60/134  soft-tissue]
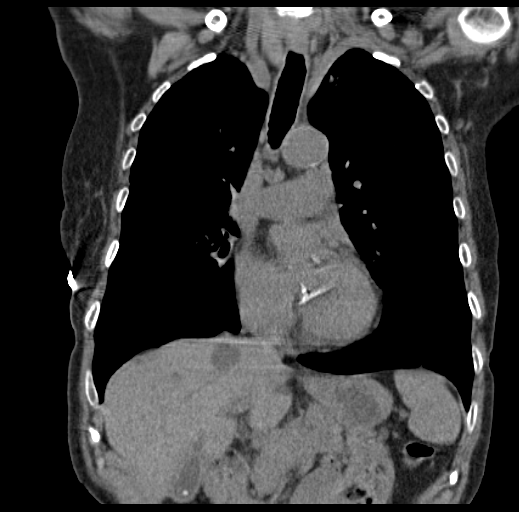
[im 74/134  soft-tissue]
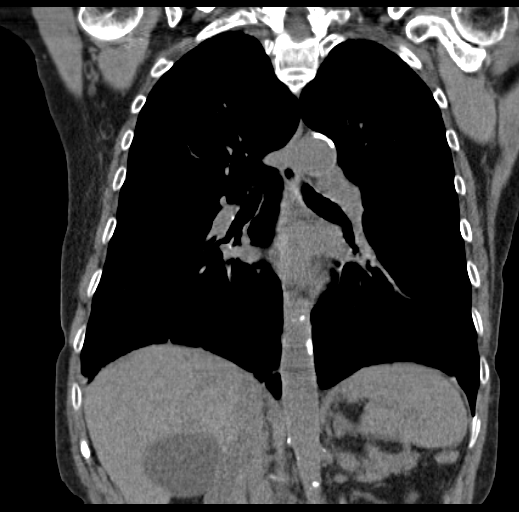

[16 of 46 positions shown; findings below may reference images not displayed]

FINDINGS: On lung window images, there are diffuse changes of peribronchial
vascular nodularity and tree in bud opacities peripherally. In
conjunction with the bronchiectatic changes primarily in the right
middle lobe and lingula, these findings are most consistent with an
atypical inflammatory or infectious process, such as mycobacterium
avium complex. Also, there are multiple nodules scattered throughout
the lungs many of which appear new when compared to the prior CT. On
image 15 within the right upper lobe there is a 12 mm poorly defined
nodular lesion present. Within the right lower lobe on image 46
there are 2 adjacent poorly defined nodules of 6 and 4 mm
respectively. On the left, there is a small nodule anteriorly in the
left upper lobe medially of 6 mm in diameter on image 11, with an
irregularly marginated 19 mm lesion within the left upper lobe on
image 13. Adjacent nodular opacities are present within the lingula
on image 37 of 5 and 6 mm in diameter respectively. An additional
nodular lesion in the lingula more inferiorly and laterally measures
8 mm on image 39 with a 12 mm poorly defined nodular lesion in the
posterior left lower lobe on the same image. These findings can be
seen with atypical pneumonia such as mycobacterium avium complex as
well. Pulmonary consultation is recommended. The central airway is
patent. No pleural effusion is seen

On soft tissue window images, the thyroid gland is unremarkable. On
this unenhanced study, there are small mediastinal lymph nodes
present none of which are pathologically enlarged. The largest lymph
node is precarinal measuring 11 mm in short axis diameter. No
definite hilar adenopathy is seen. Multiple hepatic cysts are again
noted. Incidental small gallstones layer within the gallbladder. A
left upper pole renal cyst also is present.
IMPRESSION: 1. Peribronchial vascular nodularity bilaterally with bilateral
poorly defined lung nodules and changes of bronchiectasis primarily
involving the right middle lobe and lingula. These findings suggest
an atypical process such as mycobacterium avium complex and
pulmonary consultation is recommended.
2. Incidental gallstones.

## 2017-08-03 DIAGNOSIS — M3501 Sicca syndrome with keratoconjunctivitis: Secondary | ICD-10-CM | POA: Diagnosis not present

## 2017-10-03 DIAGNOSIS — H9190 Unspecified hearing loss, unspecified ear: Secondary | ICD-10-CM | POA: Diagnosis not present

## 2017-10-03 DIAGNOSIS — J449 Chronic obstructive pulmonary disease, unspecified: Secondary | ICD-10-CM | POA: Diagnosis not present

## 2017-10-03 DIAGNOSIS — Z23 Encounter for immunization: Secondary | ICD-10-CM | POA: Diagnosis not present

## 2017-10-03 DIAGNOSIS — I10 Essential (primary) hypertension: Secondary | ICD-10-CM | POA: Diagnosis not present

## 2017-11-29 DIAGNOSIS — H6123 Impacted cerumen, bilateral: Secondary | ICD-10-CM | POA: Diagnosis not present

## 2017-11-29 DIAGNOSIS — H903 Sensorineural hearing loss, bilateral: Secondary | ICD-10-CM | POA: Diagnosis not present

## 2017-12-11 ENCOUNTER — Ambulatory Visit (INDEPENDENT_AMBULATORY_CARE_PROVIDER_SITE_OTHER): Payer: Medicare Other | Admitting: Internal Medicine

## 2017-12-11 ENCOUNTER — Encounter: Payer: Self-pay | Admitting: Internal Medicine

## 2017-12-11 VITALS — BP 147/74 | HR 101 | Temp 96.1°F | Resp 16 | Ht 62.0 in | Wt 137.0 lb

## 2017-12-11 DIAGNOSIS — J440 Chronic obstructive pulmonary disease with acute lower respiratory infection: Secondary | ICD-10-CM | POA: Diagnosis not present

## 2017-12-11 DIAGNOSIS — J209 Acute bronchitis, unspecified: Secondary | ICD-10-CM | POA: Diagnosis not present

## 2017-12-11 DIAGNOSIS — J449 Chronic obstructive pulmonary disease, unspecified: Secondary | ICD-10-CM | POA: Diagnosis not present

## 2017-12-11 DIAGNOSIS — A310 Pulmonary mycobacterial infection: Secondary | ICD-10-CM | POA: Diagnosis not present

## 2017-12-11 DIAGNOSIS — R0602 Shortness of breath: Secondary | ICD-10-CM

## 2017-12-11 MED ORDER — LEVOFLOXACIN 500 MG PO TABS
500.0000 mg | ORAL_TABLET | Freq: Every day | ORAL | 0 refills | Status: DC
Start: 2017-12-11 — End: 2019-07-23

## 2017-12-11 MED ORDER — IPRATROPIUM-ALBUTEROL 0.5-2.5 (3) MG/3ML IN SOLN: 3.0000 mL | Freq: Once | RESPIRATORY_TRACT | Status: DC

## 2017-12-11 MED ORDER — PREDNISONE 10 MG PO TABS: 10.0000 mg | ORAL_TABLET | Freq: Every day | ORAL | 0 refills | Status: DC

## 2017-12-11 NOTE — Progress Notes (Signed)
Spir

## 2017-12-11 NOTE — Progress Notes (Signed)
[facility] [place], [place] [address]  Pulmonary Sleep Medicine  Office Visit Note  Patient Name: Breanna Christian DOB: Apr 09, 1941 MRN [id number]  Date of Service: 12/11/2017  Complaints/HPI:  Patient comes in as a walk-in.  She has been having cough congestion.  She is bringing up sputum.  Patient has no chest pain.  Her chest does feel little bit tight.  She has had no fevers no chills noted.  She has been doing well otherwise.  She has had no admissions to the hospital.  She denies having any hemoptysis.  She does have some sputum which is dark.  She has been sick for about 2 days prior to coming into the office.  She decided to get attention because of her underlying pulmonary disease.  ROS  General: (-) fever, (-) chills, (-) night sweats, (-) weakness Skin: (-) rashes, (-) itching,. Eyes: (-) visual changes, (-) redness, (-) itching. Nose and Sinuses: (-) nasal stuffiness or itchiness, (-) postnasal drip, (-) nosebleeds, (-) sinus trouble. Mouth and Throat: (-) sore throat, (-) hoarseness. Neck: (-) swollen glands, (-) enlarged thyroid, (-) neck pain. Respiratory: + cough, (-) bloody sputum, + shortness of breath, + wheezing. Cardiovascular: - ankle swelling, (-) chest pain. Lymphatic: (-) lymph node enlargement. Neurologic: (-) numbness, (-) tingling. Psychiatric: (-) anxiety, (-) depression   Current Medication: Outpatient Encounter Medications as of 12/11/2017  Medication Sig  . albuterol (PROVENTIL HFA;VENTOLIN HFA) 108 (90 Base) MCG/ACT inhaler Inhale 2 puffs into the lungs every 6 (six) hours as needed for wheezing or shortness of breath.  Marland Kitchen aspirin EC 81 MG tablet Take 81 mg by mouth at bedtime.   Marland Kitchen diltiazem (TIAZAC) 240 MG 24 hr capsule Take 240 mg by mouth daily.   . folic acid (FOLVITE) 1 MG tablet Take 1 mg by mouth daily.   Marland Kitchen ibuprofen (ADVIL,MOTRIN) 200 MG tablet Take 200 mg by mouth as needed.  . Multiple Vitamins-Minerals (OCUVITE  PO) Take 1 tablet by mouth daily.  . Omega-3 Fatty Acids (OMEGA-3 FISH OIL) 300 MG CAPS Take by mouth daily.  Marland Kitchen tiotropium (SPIRIVA) 18 MCG inhalation capsule Place 18 mcg into inhaler and inhale daily.  . Vitamin D, Ergocalciferol, (DRISDOL) 50000 units CAPS capsule Take 50,000 Units by mouth every 7 (seven) days. Takes on Sundays  . ARTIFICIAL TEAR OP Place 1 drop into both eyes every 6 (six) hours as needed (dry eyes). A product from [place].  Marland Kitchen azithromycin (ZITHROMAX) 250 MG tablet Take 250 mg by mouth daily.   . Cyanocobalamin (RA VITAMIN B-12 TR) 1000 MCG TBCR Take 1,000 mcg by mouth daily.   Marland Kitchen ethambutol (MYAMBUTOL) 400 MG tablet Take 400 mg by mouth 2 (two) times daily.  . methotrexate (RHEUMATREX) 2.5 MG tablet Take 15 mg by mouth once a week. Takes 6 tablets every Thursday.  . Multiple Vitamin (MULTI-VITAMINS) TABS Take 1 tablet by mouth daily. Reported on 04/25/2016  . rifampin (RIFADIN) 150 MG capsule Take 150 mg by mouth 2 (two) times daily.  . traZODone (DESYREL) 100 MG tablet Take 100 mg by mouth at bedtime as needed for sleep.   Facility-Administered Encounter Medications as of 12/11/2017  Medication  . ipratropium-albuterol (DUONEB) 0.5-2.5 (3) MG/3ML nebulizer solution 3 mL    Surgical History: Past Surgical History:  Procedure Laterality Date  . APPENDECTOMY    . BRONCHOSCOPY  02/2016  . CATARACT EXTRACTION W/PHACO Right 10/26/2016   Procedure: CATARACT EXTRACTION PHACO AND INTRAOCULAR LENS PLACEMENT (IOC);  Surgeon:  Estill Cotta, MD;  Location: [facility] ORS;  Service: Ophthalmology;  Laterality: Right;  [place] 2.17 AP% 26.5 CDE 56.86 Fluid pack lot # [id number]  . FLEXIBLE BRONCHOSCOPY Bilateral 03/16/2016   Procedure: FLEXIBLE BRONCHOSCOPY;  Surgeon: Allyne Gee, MD;  Location: [facility] ORS;  Service: Pulmonary;  Laterality: Bilateral;  . FLEXIBLE BRONCHOSCOPY N/A 03/03/2017   Procedure: FLEXIBLE BRONCHOSCOPY;  Surgeon: Allyne Gee, MD;  Location: [facility] ORS;  Service:  Pulmonary;  Laterality: N/A;  . TUBAL LIGATION      Medical History: Past Medical History:  Diagnosis Date  . Bronchiectasis ([place]) 2011  . COPD (chronic obstructive pulmonary disease) ([place])   . HOH (hard of hearing)   . Hypertension   . Lung nodules   . Non-Hodgkin lymphoma ([place]) 2003  . Pneumonia    FREQUENT IN PAST    H/O MYCOBACTERIAL  . Rheumatoid arthritis ([place])   . Sjogren's syndrome ([place])   . Xerostomia     Family History: No family history on file.  Social History: Social History   Socioeconomic History  . Marital status: Married    Spouse name: Not on file  . Number of children: Not on file  . Years of education: Not on file  . Highest education level: Not on file  Social Needs  . Financial resource strain: Not on file  . Food insecurity - worry: Not on file  . Food insecurity - inability: Not on file  . Transportation needs - medical: Not on file  . Transportation needs - non-medical: Not on file  Occupational History  . Not on file  Tobacco Use  . Smoking status: Former Smoker    Last attempt to quit: 09/01/1997    Years since quitting: 20.2  . Smokeless tobacco: Never Used  Substance and Sexual Activity  . Alcohol use: No  . Drug use: No  . Sexual activity: Not on file  Other Topics Concern  . Not on file  Social History Narrative  . Not on file    Vital Signs: Blood pressure (!) 147/74, pulse (!) 101, temperature (!) 96.1 F (35.6 C), resp. rate 16, height _0  (1.575 m), weight 137 lb (62.1 kg), SpO2 97 %.  Examination: General Appearance: The patient is well-developed, well-nourished, and in no distress. Skin: Gross inspection of skin unremarkable. Head: normocephalic, no gross deformities. Eyes: no gross deformities noted. ENT: ears appear grossly normal no exudates. Neck: Supple. No thyromegaly. No LAD. Respiratory:   Acute rhonchi noted bilaterally more pronounced in the mid to lower lung zones.  There is no crackles noted at this  time.. Cardiovascular: Normal S1 and S2 without murmur or rub. Extremities: No cyanosis. pulses are equal. Neurologic: Alert and oriented. No involuntary movements.  LABS: No results found for this or any previous visit (from the past 2160 hour(s)).  Radiology: No results found.  No results found.  No results found.    Assessment and Plan: There are no active problems to display for this patient.   1.   Acute exacerbation of COPD   she was given a respiratory treatment and check spirometry We will give her antibiotics in the form Levaquin We also prescribed for prednisone Dosepak  Follow-up chest x-ray will be ordered  2. Mycobacterium avium complex   she is actually much better she has been treated clinically I will get a follow-up x-ray make certain that this is not a flare-up of her MAI  3. Acute shortness of breath  secondary to above She  was given bronchodilator treatment here in the office   General Counseling: I have discussed the findings of the evaluation and examination with Alysandra.  I have also discussed any further diagnostic evaluation thatmay be needed or ordered today. Michaelah verbalizes understanding of the findings of todays visit. We also reviewed her medications today and discussed drug interactions and side effects including but not limited excessive drowsiness and altered mental states. We also discussed that there is always a risk not just to her but also people around her. she has been encouraged to call the office with any questions or concerns that should arise related to todays visit.    Time spent: 30mn  I have personally obtained a history, examined the patient, evaluated laboratory and imaging results, formulated the assessment and plan and placed orders.    SAllyne Gee MD [facility]

## 2017-12-11 NOTE — Progress Notes (Unsigned)
Patient did not want to schedule 2 week f/u/tat.

## 2017-12-11 NOTE — Patient Instructions (Signed)

## 2018-02-01 DIAGNOSIS — H16223 Keratoconjunctivitis sicca, not specified as Sjogren's, bilateral: Secondary | ICD-10-CM | POA: Diagnosis not present

## 2018-02-06 ENCOUNTER — Telehealth: Payer: Self-pay

## 2018-02-06 NOTE — Telephone Encounter (Signed)
Per DSK I called in authorized rx for strerapred #[id number] 1qd w/breakfast no refills to [facility]

## 2018-02-08 ENCOUNTER — Ambulatory Visit (INDEPENDENT_AMBULATORY_CARE_PROVIDER_SITE_OTHER): Payer: Medicare Other | Admitting: Internal Medicine

## 2018-02-08 ENCOUNTER — Encounter: Payer: Self-pay | Admitting: Internal Medicine

## 2018-02-08 VITALS — BP 114/70 | HR 117 | Temp 98.1°F | Resp 16 | Ht 61.0 in | Wt 134.1 lb

## 2018-02-08 DIAGNOSIS — J440 Chronic obstructive pulmonary disease with acute lower respiratory infection: Secondary | ICD-10-CM

## 2018-02-08 DIAGNOSIS — R05 Cough: Secondary | ICD-10-CM | POA: Diagnosis not present

## 2018-02-08 DIAGNOSIS — J209 Acute bronchitis, unspecified: Secondary | ICD-10-CM | POA: Diagnosis not present

## 2018-02-08 DIAGNOSIS — R0602 Shortness of breath: Secondary | ICD-10-CM

## 2018-02-08 DIAGNOSIS — R059 Cough, unspecified: Secondary | ICD-10-CM

## 2018-02-08 DIAGNOSIS — A310 Pulmonary mycobacterial infection: Secondary | ICD-10-CM | POA: Diagnosis not present

## 2018-02-08 LAB — POC INFLUENZA A&B (BINAX/QUICKVUE)
Influenza A, POC: NEGATIVE
Influenza B, POC: NEGATIVE

## 2018-02-08 LAB — POCT RAPID STREP A (OFFICE): Rapid Strep A Screen: NEGATIVE

## 2018-02-08 MED ORDER — IPRATROPIUM-ALBUTEROL 0.5-2.5 (3) MG/3ML IN SOLN
3.0000 mL | Freq: Once | RESPIRATORY_TRACT | Status: AC
  Administered 2018-02-08: 3 mL via RESPIRATORY_TRACT

## 2018-02-08 NOTE — Progress Notes (Signed)
[facility] [place], [place] [address]  Pulmonary Sleep Medicine  Office Visit Note  Patient Name: Breanna Christian DOB: 19-Aug-1941 MRN [id number]  Date of Service: 02/08/2018  Complaints/HPI:  She comes in because she is having increasing cough and shortness of breath.  She thinks she may have been exposed to someone that was sick.  She has not had a flu test done and should probably get a flu swab.  Right now she has had no fevers no chills.  She just has been having a lot of wheezing.  Should be known that she does have a history of MAI and has completed a course.  My concern of course would be that she may be having a resurgence of the bacteria. She was given antibiotics and a prednisone taper she feels a little bit better but is not quite back to baseline.  ROS  General: (-) fever, (-) chills, (-) night sweats, (-) weakness Skin: (-) rashes, (-) itching,. Eyes: (-) visual changes, (-) redness, (-) itching. Nose and Sinuses: (-) nasal stuffiness or itchiness, (-) postnasal drip, (-) nosebleeds, (-) sinus trouble. Mouth and Throat: (-) sore throat, (-) hoarseness. Neck: (-) swollen glands, (-) enlarged thyroid, (-) neck pain. Respiratory: + cough, (-) bloody sputum, + shortness of breath, + wheezing. Cardiovascular: - ankle swelling, (-) chest pain. Lymphatic: (-) lymph node enlargement. Neurologic: (-) numbness, (-) tingling. Psychiatric: (-) anxiety, (-) depression   Current Medication: Outpatient Encounter Medications as of 02/08/2018  Medication Sig  . albuterol (PROVENTIL HFA;VENTOLIN HFA) 108 (90 Base) MCG/ACT inhaler Inhale 2 puffs into the lungs every 6 (six) hours as needed for wheezing or shortness of breath.  . ARTIFICIAL TEAR OP Place 1 drop into both eyes every 6 (six) hours as needed (dry eyes). A product from [place].  Marland Kitchen aspirin EC 81 MG tablet Take 81 mg by mouth at bedtime.   Marland Kitchen azithromycin (ZITHROMAX) 250 MG tablet Take 250 mg by mouth  daily.   . Cyanocobalamin (RA VITAMIN B-12 TR) 1000 MCG TBCR Take 1,000 mcg by mouth daily.   Marland Kitchen diltiazem (TIAZAC) 240 MG 24 hr capsule Take 240 mg by mouth daily.   Marland Kitchen ethambutol (MYAMBUTOL) 400 MG tablet Take 400 mg by mouth 2 (two) times daily.  . folic acid (FOLVITE) 1 MG tablet Take 1 mg by mouth daily.   Marland Kitchen ibuprofen (ADVIL,MOTRIN) 200 MG tablet Take 200 mg by mouth as needed.  Marland Kitchen levofloxacin (LEVAQUIN) 500 MG tablet Take 1 tablet (500 mg total) by mouth daily.  . methotrexate (RHEUMATREX) 2.5 MG tablet Take 15 mg by mouth once a week. Takes 6 tablets every Thursday.  . Multiple Vitamin (MULTI-VITAMINS) TABS Take 1 tablet by mouth daily. Reported on 04/25/2016  . Multiple Vitamins-Minerals (OCUVITE PO) Take 1 tablet by mouth daily.  . Omega-3 Fatty Acids (OMEGA-3 FISH OIL) 300 MG CAPS Take by mouth daily.  . predniSONE (DELTASONE) 10 MG tablet Take 1 tablet (10 mg total) by mouth daily with breakfast.  . rifampin (RIFADIN) 150 MG capsule Take 150 mg by mouth 2 (two) times daily.  Marland Kitchen tiotropium (SPIRIVA) 18 MCG inhalation capsule Place 18 mcg into inhaler and inhale daily.  . traZODone (DESYREL) 100 MG tablet Take 100 mg by mouth at bedtime as needed for sleep.  . Vitamin D, Ergocalciferol, (DRISDOL) 50000 units CAPS capsule Take 50,000 Units by mouth every 7 (seven) days. Takes on Sundays   Facility-Administered Encounter Medications as of 02/08/2018  Medication  .  ipratropium-albuterol (DUONEB) 0.5-2.5 (3) MG/3ML nebulizer solution 3 mL    Surgical History: Past Surgical History:  Procedure Laterality Date  . APPENDECTOMY    . BRONCHOSCOPY  02/2016  . CATARACT EXTRACTION W/PHACO Right 10/26/2016   Procedure: CATARACT EXTRACTION PHACO AND INTRAOCULAR LENS PLACEMENT (IOC);  Surgeon: Estill Cotta, MD;  Location: [facility] ORS;  Service: Ophthalmology;  Laterality: Right;  [place] 2.17 AP% 26.5 CDE 56.86 Fluid pack lot # [id number]  . FLEXIBLE BRONCHOSCOPY Bilateral 03/16/2016   Procedure:  FLEXIBLE BRONCHOSCOPY;  Surgeon: Allyne Gee, MD;  Location: [facility] ORS;  Service: Pulmonary;  Laterality: Bilateral;  . FLEXIBLE BRONCHOSCOPY N/A 03/03/2017   Procedure: FLEXIBLE BRONCHOSCOPY;  Surgeon: Allyne Gee, MD;  Location: [facility] ORS;  Service: Pulmonary;  Laterality: N/A;  . TUBAL LIGATION      Medical History: Past Medical History:  Diagnosis Date  . Bronchiectasis ([place]) 2011  . COPD (chronic obstructive pulmonary disease) ([place])   . HOH (hard of hearing)   . Hypertension   . Lung nodules   . Non-Hodgkin lymphoma ([place]) 2003  . Pneumonia    FREQUENT IN PAST    H/O MYCOBACTERIAL  . Rheumatoid arthritis ([place])   . Sjogren's syndrome ([place])   . Xerostomia     Family History: History reviewed. No pertinent family history.  Social History: Social History   Socioeconomic History  . Marital status: Married    Spouse name: Not on file  . Number of children: Not on file  . Years of education: Not on file  . Highest education level: Not on file  Occupational History  . Not on file  Social Needs  . Financial resource strain: Not on file  . Food insecurity:    Worry: Not on file    Inability: Not on file  . Transportation needs:    Medical: Not on file    Non-medical: Not on file  Tobacco Use  . Smoking status: Former Smoker    Last attempt to quit: 09/01/1997    Years since quitting: 20.4  . Smokeless tobacco: Never Used  Substance and Sexual Activity  . Alcohol use: No  . Drug use: No  . Sexual activity: Not on file  Lifestyle  . Physical activity:    Days per week: Not on file    Minutes per session: Not on file  . Stress: Not on file  Relationships  . Social connections:    Talks on phone: Not on file    Gets together: Not on file    Attends religious service: Not on file    Active member of club or organization: Not on file    Attends meetings of clubs or organizations: Not on file    Relationship status: Not on file  . Intimate partner violence:     Fear of current or ex partner: Not on file    Emotionally abused: Not on file    Physically abused: Not on file    Forced sexual activity: Not on file  Other Topics Concern  . Not on file  Social History Narrative  . Not on file    Vital Signs: Blood pressure 114/70, pulse (!) 117, temperature 98.1 F (36.7 C), temperature source Oral, resp. rate 16, height _0  (1.549 m), weight 134 lb 1.6 oz (60.8 kg), SpO2 98 %.  Examination: General Appearance: The patient is well-developed, well-nourished, and in no distress. Skin: Gross inspection of skin unremarkable. Head: normocephalic, no gross deformities. Eyes: no gross deformities noted. ENT:  ears appear grossly normal no exudates. Neck: Supple. No thyromegaly. No LAD. Respiratory: she has some rhonchi noted. Cardiovascular: Normal S1 and S2 without murmur or rub. Extremities: No cyanosis. pulses are equal. Neurologic: Alert and oriented. No involuntary movements.  LABS: No results found for this or any previous visit (from the past 2160 hour(s)).  Radiology: No results found.  No results found.  No results found.    Assessment and Plan: Patient Active Problem List   Diagnosis Date Noted  . Numbness and tingling 03/25/2015  . Myopathy 02/19/2015  . Polyneuropathy 02/18/2015  . Weakness of both lower extremities 02/18/2015    1. COPD  In the midst of an acute exacerbation.  She will continue with her prednisone taper and she will also continue with antibiotics as were prescribed.  I will see her back next week to see how she is doing 2. Acute bronchitis as noted she will get her antibiotics and also will continue with prednisone taper I have asked her also to go back on her oxygen as previously prescribed 24 hr a day 3. MAI she will continue with supportive care for now we might need to resume her mycobacterium therapy as noted above this may represent a resurgence of her disease 4. COugh she will continue with  antibiotics and steroids as already indicated above  General Counseling: I have discussed the findings of the evaluation and examination with Carlise.  I have also discussed any further diagnostic evaluation thatmay be needed or ordered today. Kariah verbalizes understanding of the findings of todays visit. We also reviewed her medications today and discussed drug interactions and side effects including but not limited excessive drowsiness and altered mental states. We also discussed that there is always a risk not just to her but also people around her. she has been encouraged to call the office with any questions or concerns that should arise related to todays visit.    Time spent:  15 min   I have personally obtained a history, examined the patient, evaluated laboratory and imaging results, formulated the assessment and plan and placed orders.    Allyne Gee, MD [facility] Pulmonary and Critical Care Sleep medicine

## 2018-02-08 NOTE — Patient Instructions (Signed)

## 2018-02-09 ENCOUNTER — Telehealth: Payer: Self-pay

## 2018-02-09 NOTE — Telephone Encounter (Signed)
Per DSK I called in duoneb q6h prn w/ 2rfs to [employer]; pt's son has been advised as well.titania

## 2018-02-19 ENCOUNTER — Ambulatory Visit (INDEPENDENT_AMBULATORY_CARE_PROVIDER_SITE_OTHER): Payer: Medicare Other | Admitting: Internal Medicine

## 2018-02-19 ENCOUNTER — Encounter: Payer: Self-pay | Admitting: Internal Medicine

## 2018-02-19 VITALS — BP 128/72 | HR 89 | Ht 63.0 in | Wt 140.0 lb

## 2018-02-19 DIAGNOSIS — J449 Chronic obstructive pulmonary disease, unspecified: Secondary | ICD-10-CM | POA: Diagnosis not present

## 2018-02-19 DIAGNOSIS — J9611 Chronic respiratory failure with hypoxia: Secondary | ICD-10-CM

## 2018-02-19 DIAGNOSIS — J209 Acute bronchitis, unspecified: Secondary | ICD-10-CM | POA: Diagnosis not present

## 2018-02-19 DIAGNOSIS — A310 Pulmonary mycobacterial infection: Secondary | ICD-10-CM

## 2018-02-19 DIAGNOSIS — J440 Chronic obstructive pulmonary disease with acute lower respiratory infection: Secondary | ICD-10-CM

## 2018-02-19 NOTE — Progress Notes (Signed)
[facility] [place], [place] [address]  Pulmonary Sleep Medicine   Office Visit Note  Patient Name: Breanna Christian DOB: 1941/09/19 MRN [id number]  Date of Service: 02/19/2018  Complaints/HPI:  She is feeling much better.  She has less shortness of breath noted.  Denies having any cough.  She has been using her oxygen and states that it does benefit her.  She would like to know however if she is able to come off of the oxygen for any period of time.  She has not had a 6 walk which we will recheck.  She has completed her antibiotics also has completed her steroids.  The question is whether not to resume her antimycobacterial medications and I am weaning at this point towards placing her back on the medications since she had such a dramatic improvement while she was on the medications.  It is also likely that she has had resurgence of the MAI and she would gain benefit from being placed on medications again  ROS  General: (-) fever, (-) chills, (-) night sweats, (-) weakness Skin: (-) rashes, (-) itching,. Eyes: (-) visual changes, (-) redness, (-) itching. Nose and Sinuses: (-) nasal stuffiness or itchiness, (-) postnasal drip, (-) nosebleeds, (-) sinus trouble. Mouth and Throat: (-) sore throat, (-) hoarseness. Neck: (-) swollen glands, (-) enlarged thyroid, (-) neck pain. Respiratory: - cough, (-) bloody sputum, - shortness of breath, - wheezing. Cardiovascular: - ankle swelling, (-) chest pain. Lymphatic: (-) lymph node enlargement. Neurologic: (-) numbness, (-) tingling. Psychiatric: (-) anxiety, (-) depression   Current Medication: Outpatient Encounter Medications as of 02/19/2018  Medication Sig  . albuterol (PROVENTIL HFA;VENTOLIN HFA) 108 (90 Base) MCG/ACT inhaler Inhale 2 puffs into the lungs every 6 (six) hours as needed for wheezing or shortness of breath.  Marland Kitchen aspirin EC 81 MG tablet Take 81 mg by mouth at bedtime.   Marland Kitchen azithromycin (ZITHROMAX) 250  MG tablet Take 250 mg by mouth daily.   . Cyanocobalamin (RA VITAMIN B-12 TR) 1000 MCG TBCR Take 1,000 mcg by mouth daily.   Marland Kitchen diltiazem (TIAZAC) 240 MG 24 hr capsule Take 240 mg by mouth daily.   . folic acid (FOLVITE) 1 MG tablet Take 1 mg by mouth daily.   Marland Kitchen ibuprofen (ADVIL,MOTRIN) 200 MG tablet Take 200 mg by mouth as needed.  . Multiple Vitamin (MULTI-VITAMINS) TABS Take 1 tablet by mouth daily. Reported on 04/25/2016  . Omega-3 Fatty Acids (OMEGA-3 FISH OIL) 300 MG CAPS Take by mouth daily.  . predniSONE (DELTASONE) 10 MG tablet Take 1 tablet (10 mg total) by mouth daily with breakfast.  . traZODone (DESYREL) 100 MG tablet Take 100 mg by mouth at bedtime as needed for sleep.  . Vitamin D, Ergocalciferol, (DRISDOL) 50000 units CAPS capsule Take 50,000 Units by mouth every 7 (seven) days. Takes on Sundays  . ARTIFICIAL TEAR OP Place 1 drop into both eyes every 6 (six) hours as needed (dry eyes). A product from [place].  Marland Kitchen ethambutol (MYAMBUTOL) 400 MG tablet Take 400 mg by mouth 2 (two) times daily.  Marland Kitchen levofloxacin (LEVAQUIN) 500 MG tablet Take 1 tablet (500 mg total) by mouth daily. (Patient not taking: Reported on 02/19/2018)  . methotrexate (RHEUMATREX) 2.5 MG tablet Take 15 mg by mouth once a week. Takes 6 tablets every Thursday.  . Multiple Vitamins-Minerals (OCUVITE PO) Take 1 tablet by mouth daily.  . rifampin (RIFADIN) 150 MG capsule Take 150 mg by mouth 2 (two) times  daily.  . tiotropium (SPIRIVA) 18 MCG inhalation capsule Place 18 mcg into inhaler and inhale daily.   Facility-Administered Encounter Medications as of 02/19/2018  Medication  . ipratropium-albuterol (DUONEB) 0.5-2.5 (3) MG/3ML nebulizer solution 3 mL    Surgical History: Past Surgical History:  Procedure Laterality Date  . APPENDECTOMY    . BRONCHOSCOPY  02/2016  . CATARACT EXTRACTION W/PHACO Right 10/26/2016   Procedure: CATARACT EXTRACTION PHACO AND INTRAOCULAR LENS PLACEMENT (IOC);  Surgeon: Estill Cotta, MD;   Location: [facility] ORS;  Service: Ophthalmology;  Laterality: Right;  [place] 2.17 AP% 26.5 CDE 56.86 Fluid pack lot # [id number]  . FLEXIBLE BRONCHOSCOPY Bilateral 03/16/2016   Procedure: FLEXIBLE BRONCHOSCOPY;  Surgeon: Allyne Gee, MD;  Location: [facility] ORS;  Service: Pulmonary;  Laterality: Bilateral;  . FLEXIBLE BRONCHOSCOPY N/A 03/03/2017   Procedure: FLEXIBLE BRONCHOSCOPY;  Surgeon: Allyne Gee, MD;  Location: [facility] ORS;  Service: Pulmonary;  Laterality: N/A;  . TUBAL LIGATION      Medical History: Past Medical History:  Diagnosis Date  . Bronchiectasis ([place]) 2011  . COPD (chronic obstructive pulmonary disease) ([place])   . HOH (hard of hearing)   . Hypertension   . Lung nodules   . Non-Hodgkin lymphoma ([place]) 2003  . Pneumonia    FREQUENT IN PAST    H/O MYCOBACTERIAL  . Rheumatoid arthritis ([place])   . Sjogren's syndrome ([place])   . Xerostomia     Family History: No family history on file.  Social History: Social History   Socioeconomic History  . Marital status: Married    Spouse name: Not on file  . Number of children: Not on file  . Years of education: Not on file  . Highest education level: Not on file  Occupational History  . Not on file  Social Needs  . Financial resource strain: Not on file  . Food insecurity:    Worry: Not on file    Inability: Not on file  . Transportation needs:    Medical: Not on file    Non-medical: Not on file  Tobacco Use  . Smoking status: Former Smoker    Last attempt to quit: 09/01/1997    Years since quitting: 20.4  . Smokeless tobacco: Never Used  Substance and Sexual Activity  . Alcohol use: No  . Drug use: No  . Sexual activity: Not on file  Lifestyle  . Physical activity:    Days per week: Not on file    Minutes per session: Not on file  . Stress: Not on file  Relationships  . Social connections:    Talks on phone: Not on file    Gets together: Not on file    Attends religious service: Not on file    Active member of club  or organization: Not on file    Attends meetings of clubs or organizations: Not on file    Relationship status: Not on file  . Intimate partner violence:    Fear of current or ex partner: Not on file    Emotionally abused: Not on file    Physically abused: Not on file    Forced sexual activity: Not on file  Other Topics Concern  . Not on file  Social History Narrative  . Not on file    Vital Signs: Blood pressure 128/72, pulse 89, height _0  (1.6 m), weight 140 lb (63.5 kg), SpO2 98 %.  Examination: General Appearance: The patient is well-developed, well-nourished, and in no distress. Skin: Gross inspection  of skin unremarkable. Head: normocephalic, no gross deformities. Eyes: no gross deformities noted. ENT: ears appear grossly normal no exudates. Neck: Supple. No thyromegaly. No LAD. Respiratory: no rhonchi noted at this time. Cardiovascular: Normal S1 and S2 without murmur or rub. Extremities: No cyanosis. pulses are equal. Neurologic: Alert and oriented. No involuntary movements.  LABS: Recent Results (from the past 2160 hour(s))  POC Influenza A&B (Binax test)     Status: None   Collection Time: 02/08/18 12:33 PM  Result Value Ref Range   Influenza A, POC Negative Negative   Influenza B, POC Negative Negative  POCT rapid strep A     Status: None   Collection Time: 02/08/18 12:34 PM  Result Value Ref Range   Rapid Strep A Screen Negative Negative    Radiology: No results found.  No results found.  No results found.    Assessment and Plan: Patient Active Problem List   Diagnosis Date Noted  . MAI (mycobacterium avium-intracellulare) ([place]) 02/08/2018  . Numbness and tingling 03/25/2015  . Myopathy 02/19/2015  . Polyneuropathy 02/18/2015  . Weakness of both lower extremities 02/18/2015    1. MAI will consider restarting the anti mycobacterial see if she has another flare up of her pulmonary issue she understands and she is willing to resume them if  needed 2. Acute Bronchitis current acute bronchitis has improved will continue to monitor closely 3. COPD severe disease by last pulmonary function evaluation will continue with inhalers 4. Chronic respiratory failure with hypoxia she has been using her oxygen and gaining benefit from it will continue with full supportive care and will also check a 6 min walk off the oxygen  General Counseling: I have discussed the findings of the evaluation and examination with Nerine.  I have also discussed any further diagnostic evaluation thatmay be needed or ordered today. Danya verbalizes understanding of the findings of todays visit. We also reviewed her medications today and discussed drug interactions and side effects including but not limited excessive drowsiness and altered mental states. We also discussed that there is always a risk not just to her but also people around her. she has been encouraged to call the office with any questions or concerns that should arise related to todays visit.    Time spent: 23mn  I have personally obtained a history, examined the patient, evaluated laboratory and imaging results, formulated the assessment and plan and placed orders.    SAllyne Gee MD FDoctors Surgery Center Of WestminsterPulmonary and Critical Care Sleep medicine

## 2018-02-19 NOTE — Patient Instructions (Signed)

## 2018-03-12 ENCOUNTER — Encounter: Payer: Self-pay | Admitting: Internal Medicine

## 2018-03-12 ENCOUNTER — Ambulatory Visit
Admission: RE | Admit: 2018-03-12 | Discharge: 2018-03-12 | Disposition: A | Discharge status: Home / Self Care | Payer: Medicare Other | Source: Ambulatory Visit | Attending: Internal Medicine | Admitting: Internal Medicine

## 2018-03-12 ENCOUNTER — Ambulatory Visit (INDEPENDENT_AMBULATORY_CARE_PROVIDER_SITE_OTHER): Payer: Medicare Other | Admitting: Internal Medicine

## 2018-03-12 VITALS — BP 108/62 | HR 110 | Temp 97.8°F | Resp 20 | Ht 62.0 in | Wt 135.0 lb

## 2018-03-12 DIAGNOSIS — J9611 Chronic respiratory failure with hypoxia: Secondary | ICD-10-CM

## 2018-03-12 DIAGNOSIS — J449 Chronic obstructive pulmonary disease, unspecified: Secondary | ICD-10-CM

## 2018-03-12 DIAGNOSIS — A310 Pulmonary mycobacterial infection: Secondary | ICD-10-CM

## 2018-03-12 DIAGNOSIS — J209 Acute bronchitis, unspecified: Secondary | ICD-10-CM | POA: Diagnosis not present

## 2018-03-12 DIAGNOSIS — J440 Chronic obstructive pulmonary disease with acute lower respiratory infection: Secondary | ICD-10-CM | POA: Diagnosis not present

## 2018-03-12 DIAGNOSIS — J410 Simple chronic bronchitis: Secondary | ICD-10-CM | POA: Diagnosis not present

## 2018-03-12 DIAGNOSIS — R05 Cough: Secondary | ICD-10-CM | POA: Diagnosis not present

## 2018-03-12 DIAGNOSIS — R0602 Shortness of breath: Secondary | ICD-10-CM

## 2018-03-12 MED ORDER — RIFAMPIN 150 MG PO CAPS: 150.0000 mg | ORAL_CAPSULE | Freq: Two times a day (BID) | ORAL | 5 refills | Status: DC

## 2018-03-12 MED ORDER — TIOTROPIUM BROMIDE MONOHYDRATE 18 MCG IN CAPS: 18.0000 ug | ORAL_CAPSULE | Freq: Every day | RESPIRATORY_TRACT | 12 refills | Status: DC

## 2018-03-12 MED ORDER — AZITHROMYCIN 250 MG PO TABS: ORAL_TABLET | ORAL | 5 refills | Status: DC

## 2018-03-12 MED ORDER — ETHAMBUTOL HCL 400 MG PO TABS: 400.0000 mg | ORAL_TABLET | Freq: Every day | ORAL | 5 refills | Status: DC

## 2018-03-12 MED ORDER — BUDESONIDE-FORMOTEROL FUMARATE 80-4.5 MCG/ACT IN AERO: 2.0000 | INHALATION_SPRAY | Freq: Two times a day (BID) | RESPIRATORY_TRACT | 12 refills | Status: DC

## 2018-03-12 NOTE — Patient Instructions (Signed)

## 2018-03-12 NOTE — Progress Notes (Signed)
[facility] [place], [place] [address]  Pulmonary Sleep Medicine   Office Visit Note  Patient Name: Breanna Christian DOB: 11-Mar-1941 MRN [id number]  Date of Service: 03/12/2018  Complaints/HPI:  She is back with increasing cough and congestion.  She has been treated now for multiple bouts of flare ups.  My concern is that she may be having a resurgence of the MAI.  I think that at this point would be prudent to place her back on triple antibiotic therapy.  She has not benefitted with treatment for the multiple bouts of flare-up that she has had.  She is having cough congestion.  She is feeling more short of breath.  She denies having any chest pain but is feeling somewhat tight in her chest.  She is using her oxygen as prescribed however still feels short of breath.  In addition she has been on steroids without much improvement  ROS  General: (-) fever, (-) chills, (-) night sweats, (-) weakness Skin: (-) rashes, (-) itching,. Eyes: (-) visual changes, (-) redness, (-) itching. Nose and Sinuses: (-) nasal stuffiness or itchiness, (-) postnasal drip, (-) nosebleeds, (-) sinus trouble. Mouth and Throat: (-) sore throat, (-) hoarseness. Neck: (-) swollen glands, (-) enlarged thyroid, (-) neck pain. Respiratory: + cough, (-) bloody sputum, + shortness of breath, + wheezing. Cardiovascular: - ankle swelling, (-) chest pain. Lymphatic: (-) lymph node enlargement. Neurologic: (-) numbness, (-) tingling. Psychiatric: (-) anxiety, (-) depression   Current Medication: Outpatient Encounter Medications as of 03/12/2018  Medication Sig  . albuterol (PROVENTIL HFA;VENTOLIN HFA) 108 (90 Base) MCG/ACT inhaler Inhale 2 puffs into the lungs every 6 (six) hours as needed for wheezing or shortness of breath.  . ARTIFICIAL TEAR OP Place 1 drop into both eyes every 6 (six) hours as needed (dry eyes). A product from [place].  Marland Kitchen aspirin EC 81 MG tablet Take 81 mg by mouth at  bedtime.   Marland Kitchen azithromycin (ZITHROMAX) 250 MG tablet Take 250 mg by mouth daily.   . Cyanocobalamin (RA VITAMIN B-12 TR) 1000 MCG TBCR Take 1,000 mcg by mouth daily.   Marland Kitchen diltiazem (TIAZAC) 240 MG 24 hr capsule Take 240 mg by mouth daily.   Marland Kitchen ethambutol (MYAMBUTOL) 400 MG tablet Take 400 mg by mouth 2 (two) times daily.  . folic acid (FOLVITE) 1 MG tablet Take 1 mg by mouth daily.   Marland Kitchen ibuprofen (ADVIL,MOTRIN) 200 MG tablet Take 200 mg by mouth as needed.  Marland Kitchen levofloxacin (LEVAQUIN) 500 MG tablet Take 1 tablet (500 mg total) by mouth daily. (Patient not taking: Reported on 02/19/2018)  . methotrexate (RHEUMATREX) 2.5 MG tablet Take 15 mg by mouth once a week. Takes 6 tablets every Thursday.  . Multiple Vitamin (MULTI-VITAMINS) TABS Take 1 tablet by mouth daily. Reported on 04/25/2016  . Multiple Vitamins-Minerals (OCUVITE PO) Take 1 tablet by mouth daily.  . Omega-3 Fatty Acids (OMEGA-3 FISH OIL) 300 MG CAPS Take by mouth daily.  . predniSONE (DELTASONE) 10 MG tablet Take 1 tablet (10 mg total) by mouth daily with breakfast.  . rifampin (RIFADIN) 150 MG capsule Take 150 mg by mouth 2 (two) times daily.  Marland Kitchen tiotropium (SPIRIVA) 18 MCG inhalation capsule Place 18 mcg into inhaler and inhale daily.  . traZODone (DESYREL) 100 MG tablet Take 100 mg by mouth at bedtime as needed for sleep.  . Vitamin D, Ergocalciferol, (DRISDOL) 50000 units CAPS capsule Take 50,000 Units by mouth every 7 (seven) days. Takes on  Sundays   Facility-Administered Encounter Medications as of 03/12/2018  Medication  . ipratropium-albuterol (DUONEB) 0.5-2.5 (3) MG/3ML nebulizer solution 3 mL    Surgical History: Past Surgical History:  Procedure Laterality Date  . APPENDECTOMY    . BRONCHOSCOPY  02/2016  . CATARACT EXTRACTION W/PHACO Right 10/26/2016   Procedure: CATARACT EXTRACTION PHACO AND INTRAOCULAR LENS PLACEMENT (IOC);  Surgeon: Estill Cotta, MD;  Location: [facility] ORS;  Service: Ophthalmology;  Laterality: Right;  [place]  2.17 AP% 26.5 CDE 56.86 Fluid pack lot # [id number]  . FLEXIBLE BRONCHOSCOPY Bilateral 03/16/2016   Procedure: FLEXIBLE BRONCHOSCOPY;  Surgeon: Allyne Gee, MD;  Location: [facility] ORS;  Service: Pulmonary;  Laterality: Bilateral;  . FLEXIBLE BRONCHOSCOPY N/A 03/03/2017   Procedure: FLEXIBLE BRONCHOSCOPY;  Surgeon: Allyne Gee, MD;  Location: [facility] ORS;  Service: Pulmonary;  Laterality: N/A;  . TUBAL LIGATION      Medical History: Past Medical History:  Diagnosis Date  . Bronchiectasis ([place]) 2011  . COPD (chronic obstructive pulmonary disease) ([place])   . HOH (hard of hearing)   . Hypertension   . Lung nodules   . Non-Hodgkin lymphoma ([place]) 2003  . Pneumonia    FREQUENT IN PAST    H/O MYCOBACTERIAL  . Rheumatoid arthritis ([place])   . Sjogren's syndrome ([place])   . Xerostomia     Family History: No family history on file.  Social History: Social History   Socioeconomic History  . Marital status: Married    Spouse name: Not on file  . Number of children: Not on file  . Years of education: Not on file  . Highest education level: Not on file  Occupational History  . Not on file  Social Needs  . Financial resource strain: Not on file  . Food insecurity:    Worry: Not on file    Inability: Not on file  . Transportation needs:    Medical: Not on file    Non-medical: Not on file  Tobacco Use  . Smoking status: Former Smoker    Last attempt to quit: 09/01/1997    Years since quitting: 20.5  . Smokeless tobacco: Never Used  Substance and Sexual Activity  . Alcohol use: No  . Drug use: No  . Sexual activity: Not on file  Lifestyle  . Physical activity:    Days per week: Not on file    Minutes per session: Not on file  . Stress: Not on file  Relationships  . Social connections:    Talks on phone: Not on file    Gets together: Not on file    Attends religious service: Not on file    Active member of club or organization: Not on file    Attends meetings of clubs or  organizations: Not on file    Relationship status: Not on file  . Intimate partner violence:    Fear of current or ex partner: Not on file    Emotionally abused: Not on file    Physically abused: Not on file    Forced sexual activity: Not on file  Other Topics Concern  . Not on file  Social History Narrative  . Not on file    Vital Signs: Blood pressure 108/62, pulse (!) 110, temperature 97.8 F (36.6 C), resp. rate 20, height _0  (1.575 m), weight 135 lb (61.2 kg), SpO2 96 %.  Examination: General Appearance: The patient is well-developed, well-nourished, and in no distress. Skin: Gross inspection of skin unremarkable. Head: normocephalic, no gross  deformities. Eyes: no gross deformities noted. ENT: ears appear grossly normal no exudates. Neck: Supple. No thyromegaly. No LAD. Respiratory: scattered rhonchi noted. Cardiovascular: Normal S1 and S2 without murmur or rub. Extremities: No cyanosis. pulses are equal. Neurologic: Alert and oriented. No involuntary movements.  LABS: Recent Results (from the past 2160 hour(s))  POC Influenza A&B (Binax test)     Status: None   Collection Time: 02/08/18 12:33 PM  Result Value Ref Range   Influenza A, POC Negative Negative   Influenza B, POC Negative Negative  POCT rapid strep A     Status: None   Collection Time: 02/08/18 12:34 PM  Result Value Ref Range   Rapid Strep A Screen Negative Negative    Radiology: No results found.  No results found.  No results found.    Assessment and Plan: Patient Active Problem List   Diagnosis Date Noted  . MAI (mycobacterium avium-intracellulare) ([place]) 02/08/2018  . Numbness and tingling 03/25/2015  . Myopathy 02/19/2015  . Polyneuropathy 02/18/2015  . Weakness of both lower extremities 02/18/2015    1. COPD severe disease with underlying MAI infection.  Recommended placing her back on antibiotic therapy.  She will continue with inhalers also gave her prescription for the  Symbicort as well as Spiriva.  Apparently the trilogy is not covered by her insurance 2. MAI started her back on  Rifampin ethambutol and azithromycin 3. Acute Bronchitis flare-up secondary to underlying chronic infection as noted above she will be started back on antibiotic therapy 4. Chronic Respiratory failure with hypoxia she needs to go back on her oxygen and be compliant with I gave her counseling on this that she does need to be 100% compliant with her O2 therapy as prescribed  General Counseling: I have discussed the findings of the evaluation and examination with Daliya.  I have also discussed any further diagnostic evaluation thatmay be needed or ordered today. Loletta verbalizes understanding of the findings of todays visit. We also reviewed her medications today and discussed drug interactions and side effects including but not limited excessive drowsiness and altered mental states. We also discussed that there is always a risk not just to her but also people around her. she has been encouraged to call the office with any questions or concerns that should arise related to todays visit.    Time spent: 40mn  I have personally obtained a history, examined the patient, evaluated laboratory and imaging results, formulated the assessment and plan and placed orders.    SAllyne Gee MD FNorthshore Ambulatory Surgery Center LLCPulmonary and Critical Care Sleep medicine

## 2018-04-01 ENCOUNTER — Other Ambulatory Visit: Payer: Self-pay | Admitting: Internal Medicine

## 2018-04-02 ENCOUNTER — Other Ambulatory Visit: Payer: Self-pay | Admitting: Internal Medicine

## 2018-04-06 ENCOUNTER — Encounter: Payer: Self-pay | Admitting: Nurse Practitioner

## 2018-04-06 ENCOUNTER — Ambulatory Visit (INDEPENDENT_AMBULATORY_CARE_PROVIDER_SITE_OTHER): Payer: Medicare Other | Admitting: Nurse Practitioner

## 2018-04-06 VITALS — BP 129/67 | HR 88 | Resp 16 | Ht 62.0 in | Wt 132.0 lb

## 2018-04-06 DIAGNOSIS — Z0001 Encounter for general adult medical examination with abnormal findings: Secondary | ICD-10-CM | POA: Diagnosis not present

## 2018-04-06 DIAGNOSIS — Z0000 Encounter for general adult medical examination without abnormal findings: Secondary | ICD-10-CM | POA: Diagnosis not present

## 2018-04-06 DIAGNOSIS — I10 Essential (primary) hypertension: Secondary | ICD-10-CM

## 2018-04-06 DIAGNOSIS — Z1231 Encounter for screening mammogram for malignant neoplasm of breast: Secondary | ICD-10-CM

## 2018-04-06 DIAGNOSIS — J449 Chronic obstructive pulmonary disease, unspecified: Secondary | ICD-10-CM | POA: Diagnosis not present

## 2018-04-06 DIAGNOSIS — Z1239 Encounter for other screening for malignant neoplasm of breast: Secondary | ICD-10-CM

## 2018-04-06 NOTE — Progress Notes (Signed)
[facility] [place], [place] [address]  Internal MEDICINE  Office Visit Note  Patient Name: Breanna Christian  [id number]  [id number]  Date of Service: 04/29/2018    Pt is here for routine health maintenance examination  Chief Complaint  Patient presents with  . Annual Exam  . Hypertension  . COPD     Hypertension  This is a chronic problem. The current episode started more than 1 year ago. The problem is unchanged. The problem is controlled. Associated symptoms include shortness of breath. Pertinent negatives include no chest pain, headaches, neck pain or palpitations. Risk factors for coronary artery disease include post-menopausal state. Past treatments include calcium channel blockers. The current treatment provides moderate improvement. There are no compliance problems.   COPD  She complains of shortness of breath and wheezing. There is no cough. This is a chronic problem. The current episode started more than 1 year ago. The problem has been unchanged. Associated symptoms include dyspnea on exertion. Pertinent negatives include no chest pain, headaches, postnasal drip, rhinorrhea, sneezing or sore throat. Her symptoms are aggravated by pollen. Her symptoms are alleviated by beta-agonist and steroid inhaler. She reports moderate improvement on treatment. Risk factors for lung disease include smoking/tobacco exposure. Her past medical history is significant for COPD.     Current Medication: Outpatient Encounter Medications as of 04/06/2018  Medication Sig  . ARTIFICIAL TEAR OP Place 1 drop into both eyes every 6 (six) hours as needed (dry eyes). A product from [place].  Marland Kitchen aspirin EC 81 MG tablet Take 81 mg by mouth at bedtime.   Marland Kitchen azithromycin (ZITHROMAX) 250 MG tablet 1 tab po daily  . budesonide-formoterol (SYMBICORT) 80-4.5 MCG/ACT inhaler Inhale 2 puffs into the lungs 2 times daily at 12 noon and 4 pm.  . Cyanocobalamin (RA VITAMIN B-12 TR) 1000  MCG TBCR Take 1,000 mcg by mouth daily.   Marland Kitchen diltiazem (CARDIZEM CD) 240 MG 24 hr capsule TAKE 1 CAPSULE BY MOUTH EVERY DAY  . diltiazem (TIAZAC) 240 MG 24 hr capsule Take 240 mg by mouth daily.   Marland Kitchen ethambutol (MYAMBUTOL) 400 MG tablet Take 1 tablet (400 mg total) by mouth daily.  . folic acid (FOLVITE) 1 MG tablet Take 1 mg by mouth daily.   Marland Kitchen ibuprofen (ADVIL,MOTRIN) 200 MG tablet Take 200 mg by mouth as needed.  Marland Kitchen ipratropium-albuterol (DUONEB) 0.5-2.5 (3) MG/3ML SOLN USE EVERY 6 HOURS AS NEEDED  . Multiple Vitamin (MULTI-VITAMINS) TABS Take 1 tablet by mouth daily. Reported on 04/25/2016  . Multiple Vitamins-Minerals (OCUVITE PO) Take 1 tablet by mouth daily.  . Omega-3 Fatty Acids (OMEGA-3 FISH OIL) 300 MG CAPS Take by mouth daily.  . rifampin (RIFADIN) 150 MG capsule Take 1 capsule (150 mg total) by mouth 2 (two) times daily.  Marland Kitchen tiotropium (SPIRIVA HANDIHALER) 18 MCG inhalation capsule Place 1 capsule (18 mcg total) into inhaler and inhale daily.  . Vitamin D, Ergocalciferol, (DRISDOL) 50000 units CAPS capsule Take 50,000 Units by mouth every 7 (seven) days. Takes on Sundays  . levofloxacin (LEVAQUIN) 500 MG tablet Take 1 tablet (500 mg total) by mouth daily. (Patient not taking: Reported on 02/19/2018)  . methotrexate (RHEUMATREX) 2.5 MG tablet Take 15 mg by mouth once a week. Takes 6 tablets every Thursday.  . predniSONE (DELTASONE) 10 MG tablet Take 1 tablet (10 mg total) by mouth daily with breakfast.  . traZODone (DESYREL) 100 MG tablet Take 100 mg by mouth at bedtime as needed for sleep.  . [  DISCONTINUED] albuterol (PROVENTIL HFA;VENTOLIN HFA) 108 (90 Base) MCG/ACT inhaler Inhale 2 puffs into the lungs every 6 (six) hours as needed for wheezing or shortness of breath.   Facility-Administered Encounter Medications as of 04/06/2018  Medication  . ipratropium-albuterol (DUONEB) 0.5-2.5 (3) MG/3ML nebulizer solution 3 mL    Surgical History: Past Surgical History:  Procedure Laterality  Date  . APPENDECTOMY    . BRONCHOSCOPY  02/2016  . CATARACT EXTRACTION W/PHACO Right 10/26/2016   Procedure: CATARACT EXTRACTION PHACO AND INTRAOCULAR LENS PLACEMENT (IOC);  Surgeon: Estill Cotta, MD;  Location: [facility] ORS;  Service: Ophthalmology;  Laterality: Right;  [place] 2.17 AP% 26.5 CDE 56.86 Fluid pack lot # [id number]  . FLEXIBLE BRONCHOSCOPY Bilateral 03/16/2016   Procedure: FLEXIBLE BRONCHOSCOPY;  Surgeon: Allyne Gee, MD;  Location: [facility] ORS;  Service: Pulmonary;  Laterality: Bilateral;  . FLEXIBLE BRONCHOSCOPY N/A 03/03/2017   Procedure: FLEXIBLE BRONCHOSCOPY;  Surgeon: Allyne Gee, MD;  Location: [facility] ORS;  Service: Pulmonary;  Laterality: N/A;  . TUBAL LIGATION      Medical History: Past Medical History:  Diagnosis Date  . Bronchiectasis ([place]) 2011  . COPD (chronic obstructive pulmonary disease) ([place])   . HOH (hard of hearing)   . Hypertension   . Lung nodules   . Non-Hodgkin lymphoma ([place]) 2003  . Pneumonia    FREQUENT IN PAST    H/O MYCOBACTERIAL  . Rheumatoid arthritis ([place])   . Sjogren's syndrome ([place])   . Xerostomia     Family History: History reviewed. No pertinent family history.    Review of Systems  Constitutional: Negative for activity change, chills, fatigue and unexpected weight change.  HENT: Negative for congestion, postnasal drip, rhinorrhea, sneezing and sore throat.   Eyes: Negative.  Negative for redness.  Respiratory: Positive for shortness of breath and wheezing. Negative for cough and chest tightness.        Generally well-managed   Cardiovascular: Positive for dyspnea on exertion. Negative for chest pain and palpitations.  Gastrointestinal: Negative for abdominal pain, constipation, diarrhea, nausea and vomiting.  Endocrine: Negative for cold intolerance, heat intolerance, polydipsia, polyphagia and polyuria.  Genitourinary: Negative for dysuria and frequency.  Musculoskeletal: Negative for arthralgias, back pain, joint swelling and  neck pain.  Skin: Negative for rash.  Allergic/Immunologic: Positive for environmental allergies.  Neurological: Negative for dizziness, tremors, numbness and headaches.  Hematological: Negative for adenopathy. Does not bruise/bleed easily.  Psychiatric/Behavioral: Negative for behavioral problems (Depression), dysphoric mood, sleep disturbance and suicidal ideas. The patient is not nervous/anxious.     Today's Vitals   04/06/18 1032  BP: 129/67  Pulse: 88  Resp: 16  SpO2: 96%  Weight: 132 lb (59.9 kg)  Height: _0  (1.575 m)   Physical Exam  Constitutional: She is oriented to person, place, and time. She appears well-developed and well-nourished. No distress.  HENT:  Head: Normocephalic and atraumatic.  Nose: Nose normal.  Mouth/Throat: Oropharynx is clear and moist. No oropharyngeal exudate.  Eyes: Pupils are equal, round, and reactive to light. Conjunctivae and EOM are normal.  Neck: Normal range of motion. Neck supple. No JVD present. Carotid bruit is not present. No tracheal deviation present. No thyromegaly present.  Cardiovascular: Normal rate, regular rhythm, normal heart sounds and intact distal pulses. Exam reveals no gallop and no friction rub.  No murmur heard. Pulmonary/Chest: Effort normal and breath sounds normal. No respiratory distress. She has no wheezes. She has no rales. She exhibits no tenderness.  Some mild congestion which clears with  cough.   Abdominal: Soft. Bowel sounds are normal. There is no tenderness.  Musculoskeletal: Normal range of motion.  Lymphadenopathy:    She has no cervical adenopathy.  Neurological: She is alert and oriented to person, place, and time. She displays normal reflexes. No cranial nerve deficit. Coordination normal.  Skin: Skin is warm and dry. Capillary refill takes 2 to 3 seconds. She is not diaphoretic.  Psychiatric: She has a normal mood and affect. Her behavior is normal. Judgment and thought content normal.  Nursing note  and vitals reviewed.   LABS: Recent Results (from the past 2160 hour(s))  POC Influenza A&B (Binax test)     Status: None   Collection Time: 02/08/18 12:33 PM  Result Value Ref Range   Influenza A, POC Negative Negative   Influenza B, POC Negative Negative  POCT rapid strep A     Status: None   Collection Time: 02/08/18 12:34 PM  Result Value Ref Range   Rapid Strep A Screen Negative Negative  Urinalysis, Routine w reflex microscopic     Status: None   Collection Time: 04/06/18  1:59 PM  Result Value Ref Range   Specific Gravity, UA 1.021 1.005 - 1.030   pH, UA 5.5 5.0 - 7.5   Color, UA Yellow Yellow   Appearance Ur Clear Clear   Leukocytes, UA Negative Negative   Protein, UA Negative Negative/Trace   Glucose, UA Negative Negative   Ketones, UA Negative Negative   RBC, UA Negative Negative   Bilirubin, UA Negative Negative   Urobilinogen, Ur 0.2 0.2 - 1.0 mg/dL   Nitrite, UA Negative Negative   Microscopic Examination Comment     Comment: Microscopic not indicated and not performed.  Comprehensive metabolic panel     Status: Abnormal   Collection Time: 04/20/18 11:36 AM  Result Value Ref Range   Glucose 87 65 - 99 mg/dL   BUN 11 8 - 27 mg/dL   Creatinine, Ser 0.66 0.57 - 1.00 mg/dL   GFR calc non Af Amer 87 >59 mL/min/1.73   GFR calc Af Amer 100 >59 mL/min/1.73   BUN/Creatinine Ratio 17 12 - 28   Sodium 140 134 - 144 mmol/L   Potassium 5.3 (H) 3.5 - 5.2 mmol/L   Chloride 101 96 - 106 mmol/L   CO2 27 20 - 29 mmol/L   Calcium 9.9 8.7 - 10.3 mg/dL   Total Protein 6.9 6.0 - 8.5 g/dL   Albumin 4.2 3.5 - 4.8 g/dL   Globulin, Total 2.7 1.5 - 4.5 g/dL   Albumin/Globulin Ratio 1.6 1.2 - 2.2   Bilirubin Total 0.3 0.0 - 1.2 mg/dL   Alkaline Phosphatase 108 39 - 117 IU/L   AST 28 0 - 40 IU/L   ALT 20 0 - 32 IU/L  CBC     Status: None   Collection Time: 04/20/18 11:36 AM  Result Value Ref Range   WBC 6.3 3.4 - 10.8 x10E3/uL   RBC 4.57 3.77 - 5.28 x10E6/uL   Hemoglobin  12.9 11.1 - 15.9 g/dL   Hematocrit 37.2 34.0 - 46.6 %   MCV 81 79 - 97 fL   MCH 28.2 26.6 - 33.0 pg   MCHC 34.7 31.5 - 35.7 g/dL   RDW 14.4 12.3 - 15.4 %   Platelets 295 150 - 450 x10E3/uL    Comment:               **Please note reference interval change**  Lipid Panel w/o Chol/HDL Ratio  Status: None   Collection Time: 04/20/18 11:36 AM  Result Value Ref Range   Cholesterol, Total 187 100 - 199 mg/dL   Triglycerides 98 0 - 149 mg/dL   HDL 68 >39 mg/dL   VLDL Cholesterol Cal 20 5 - 40 mg/dL   LDL Calculated 99 0 - 99 mg/dL  T4, free     Status: None   Collection Time: 04/20/18 11:36 AM  Result Value Ref Range   Free T4 1.15 0.82 - 1.77 ng/dL  TSH     Status: None   Collection Time: 04/20/18 11:36 AM  Result Value Ref Range   TSH 0.942 0.450 - 4.500 uIU/mL  VITAMIN D 25 Hydroxy (Vit-D Deficiency, Fractures)     Status: None   Collection Time: 04/20/18 11:36 AM  Result Value Ref Range   Vit D, 25-Hydroxy 55.9 30.0 - 100.0 ng/mL    Comment: Vitamin D deficiency has been defined by the [place] and an Endocrine Society practice guideline as a level of serum 25-OH vitamin D less than 20 ng/mL (1,2). The Endocrine Society went on to further define vitamin D insufficiency as a level between 21 and 29 ng/mL (2). 1. IOM (Institute of Medicine). 2010. Dietary reference    intakes for calcium and D. [place]: The    [employer]. 2. Holick MF, Binkley [place], Bischoff-Ferrari HA, et al.    Evaluation, treatment, and prevention of vitamin D    deficiency: an Endocrine Society clinical practice    guideline. JCEM. 2011 Jul; 96(7):1911-30.   Fecal occult blood, imunochemical     Status: None   Collection Time: 04/23/18 12:00 AM  Result Value Ref Range   Fecal Occult Bld Negative Negative    Assessment/Plan: 1. Encounter for general adult medical examination with abnormal findings Annual wellness visit today. - Urinalysis, Routine w reflex  microscopic  2. Essential hypertension Stable. Continue bp medication as prescribed.   3. Obstructive chronic bronchitis without exacerbation ([place]) Continue inhalers and respiratory medication as prescribed. Regular visits with Dr. Devona Konig as scheduled.   4. Screening for breast cancer Mammogram ordered.   General Counseling: Lucindy verbalizes understanding of the findings of todays visit and agrees with plan of treatment. I have discussed any further diagnostic evaluation that may be needed or ordered today. We also reviewed her medications today. she has been encouraged to call the office with any questions or concerns that should arise related to todays visit.    Counseling:  This patient was seen by Leretha Pol, FNP- C in Collaboration with Dr Lavera Guise as a part of collaborative care agreement  Orders Placed This Encounter  Procedures  . Urinalysis, Routine w reflex microscopic     Time spent: [place], MD  Internal Medicine

## 2018-04-07 LAB — URINALYSIS, ROUTINE W REFLEX MICROSCOPIC
Bilirubin, UA: NEGATIVE
Glucose, UA: NEGATIVE
Ketones, UA: NEGATIVE
Leukocytes, UA: NEGATIVE
Nitrite, UA: NEGATIVE
Protein, UA: NEGATIVE
RBC, UA: NEGATIVE
Specific Gravity, UA: 1.021 (ref 1.005–1.030)
Urobilinogen, Ur: 0.2 mg/dL (ref 0.2–1.0)
pH, UA: 5.5 (ref 5.0–7.5)

## 2018-04-09 ENCOUNTER — Encounter: Payer: Self-pay | Admitting: Internal Medicine

## 2018-04-09 ENCOUNTER — Other Ambulatory Visit: Payer: Self-pay | Admitting: Internal Medicine

## 2018-04-09 ENCOUNTER — Ambulatory Visit (INDEPENDENT_AMBULATORY_CARE_PROVIDER_SITE_OTHER): Payer: Medicare Other | Admitting: Internal Medicine

## 2018-04-09 VITALS — BP 120/80 | HR 82 | Resp 16 | Ht 62.0 in | Wt 133.0 lb

## 2018-04-09 DIAGNOSIS — J209 Acute bronchitis, unspecified: Secondary | ICD-10-CM

## 2018-04-09 DIAGNOSIS — J9611 Chronic respiratory failure with hypoxia: Secondary | ICD-10-CM | POA: Diagnosis not present

## 2018-04-09 DIAGNOSIS — J449 Chronic obstructive pulmonary disease, unspecified: Secondary | ICD-10-CM

## 2018-04-09 DIAGNOSIS — R0602 Shortness of breath: Secondary | ICD-10-CM

## 2018-04-09 DIAGNOSIS — A310 Pulmonary mycobacterial infection: Secondary | ICD-10-CM

## 2018-04-09 DIAGNOSIS — J440 Chronic obstructive pulmonary disease with acute lower respiratory infection: Secondary | ICD-10-CM

## 2018-04-09 NOTE — Progress Notes (Signed)
[facility] [place], [place] [address]  Pulmonary Sleep Medicine   Office Visit Note  Patient Name: Breanna Christian DOB: 09/12/1941 MRN [id number]  Date of Service: 04/09/2018  Complaints/HPI:  She is doing much better since the medications were restarted for her MA I. She states cough is improved she states her shortness of breath has improved.  She states that she is no longer needing to use the oxygen constantly in can actually take it off for short periods.  She has been using her inhalers as prescribed also.  She is planning on a trip to [place] and so therefore had some questions about her oxygen.  I instructed her to take her oxygen with her so that she will have it available while she is traveling.  ROS  General: (-) fever, (-) chills, (-) night sweats, (-) weakness Skin: (-) rashes, (-) itching,. Eyes: (-) visual changes, (-) redness, (-) itching. Nose and Sinuses: (-) nasal stuffiness or itchiness, (-) postnasal drip, (-) nosebleeds, (-) sinus trouble. Mouth and Throat: (-) sore throat, (-) hoarseness. Neck: (-) swollen glands, (-) enlarged thyroid, (-) neck pain. Respiratory: - cough, (-) bloody sputum, + shortness of breath, - wheezing. Cardiovascular: - ankle swelling, (-) chest pain. Lymphatic: (-) lymph node enlargement. Neurologic: (-) numbness, (-) tingling. Psychiatric: (-) anxiety, (-) depression   Current Medication: Outpatient Encounter Medications as of 04/09/2018  Medication Sig  . albuterol (PROVENTIL HFA;VENTOLIN HFA) 108 (90 Base) MCG/ACT inhaler Inhale 2 puffs into the lungs every 6 (six) hours as needed for wheezing or shortness of breath.  . ARTIFICIAL TEAR OP Place 1 drop into both eyes every 6 (six) hours as needed (dry eyes). A product from [place].  Marland Kitchen aspirin EC 81 MG tablet Take 81 mg by mouth at bedtime.   Marland Kitchen azithromycin (ZITHROMAX) 250 MG tablet 1 tab po daily  . budesonide-formoterol (SYMBICORT) 80-4.5 MCG/ACT inhaler  Inhale 2 puffs into the lungs 2 times daily at 12 noon and 4 pm.  . Cyanocobalamin (RA VITAMIN B-12 TR) 1000 MCG TBCR Take 1,000 mcg by mouth daily.   Marland Kitchen diltiazem (CARDIZEM CD) 240 MG 24 hr capsule TAKE 1 CAPSULE BY MOUTH EVERY DAY  . diltiazem (TIAZAC) 240 MG 24 hr capsule Take 240 mg by mouth daily.   Marland Kitchen ethambutol (MYAMBUTOL) 400 MG tablet Take 1 tablet (400 mg total) by mouth daily.  . folic acid (FOLVITE) 1 MG tablet Take 1 mg by mouth daily.   Marland Kitchen ibuprofen (ADVIL,MOTRIN) 200 MG tablet Take 200 mg by mouth as needed.  Marland Kitchen ipratropium-albuterol (DUONEB) 0.5-2.5 (3) MG/3ML SOLN USE EVERY 6 HOURS AS NEEDED  . levofloxacin (LEVAQUIN) 500 MG tablet Take 1 tablet (500 mg total) by mouth daily. (Patient not taking: Reported on 02/19/2018)  . methotrexate (RHEUMATREX) 2.5 MG tablet Take 15 mg by mouth once a week. Takes 6 tablets every Thursday.  . Multiple Vitamin (MULTI-VITAMINS) TABS Take 1 tablet by mouth daily. Reported on 04/25/2016  . Multiple Vitamins-Minerals (OCUVITE PO) Take 1 tablet by mouth daily.  . Omega-3 Fatty Acids (OMEGA-3 FISH OIL) 300 MG CAPS Take by mouth daily.  . predniSONE (DELTASONE) 10 MG tablet Take 1 tablet (10 mg total) by mouth daily with breakfast.  . rifampin (RIFADIN) 150 MG capsule Take 1 capsule (150 mg total) by mouth 2 (two) times daily.  Marland Kitchen tiotropium (SPIRIVA HANDIHALER) 18 MCG inhalation capsule Place 1 capsule (18 mcg total) into inhaler and inhale daily.  . traZODone (DESYREL) 100 MG  tablet Take 100 mg by mouth at bedtime as needed for sleep.  . Vitamin D, Ergocalciferol, (DRISDOL) 50000 units CAPS capsule Take 50,000 Units by mouth every 7 (seven) days. Takes on Sundays   Facility-Administered Encounter Medications as of 04/09/2018  Medication  . ipratropium-albuterol (DUONEB) 0.5-2.5 (3) MG/3ML nebulizer solution 3 mL    Surgical History: Past Surgical History:  Procedure Laterality Date  . APPENDECTOMY    . BRONCHOSCOPY  02/2016  . CATARACT EXTRACTION  W/PHACO Right 10/26/2016   Procedure: CATARACT EXTRACTION PHACO AND INTRAOCULAR LENS PLACEMENT (IOC);  Surgeon: Estill Cotta, MD;  Location: [facility] ORS;  Service: Ophthalmology;  Laterality: Right;  [place] 2.17 AP% 26.5 CDE 56.86 Fluid pack lot # [id number]  . FLEXIBLE BRONCHOSCOPY Bilateral 03/16/2016   Procedure: FLEXIBLE BRONCHOSCOPY;  Surgeon: Allyne Gee, MD;  Location: [facility] OR[facility];  Service: Pulmonary;  Laterality: Bilateral;  . FLEXIBLE BRONCHOSCOPY N/A 03/03/2017   Procedure: FLEXIBLE BRONCHOSCOPY;  Surgeon: Allyne Gee, MD;  Location: [facility] ORS;  Service: Pulmonary;  Laterality: N/A;  . TUBAL LIGATION      Medical History: Past Medical History:  Diagnosis Date  . Bronchiectasis ([place]) 2011  . COPD (chronic obstructive pulmonary disease) ([place])   . HOH (hard of hearing)   . Hypertension   . Lung nodules   . Non-Hodgkin lymphoma ([place]) 2003  . Pneumonia    FREQUENT IN PAST    H/O MYCOBACTERIAL  . Rheumatoid arthritis ([place])   . Sjogren's syndrome ([place])   . Xerostomia     Family History: History reviewed. No pertinent family history.  Social History: Social History   Socioeconomic History  . Marital status: Married    Spouse name: Not on file  . Number of children: Not on file  . Years of education: Not on file  . Highest education level: Not on file  Occupational History  . Not on file  Social Needs  . Financial resource strain: Not on file  . Food insecurity:    Worry: Not on file    Inability: Not on file  . Transportation needs:    Medical: Not on file    Non-medical: Not on file  Tobacco Use  . Smoking status: Former Smoker    Last attempt to quit: 09/01/1997    Years since quitting: 20.6  . Smokeless tobacco: Never Used  Substance and Sexual Activity  . Alcohol use: No  . Drug use: No  . Sexual activity: Not on file  Lifestyle  . Physical activity:    Days per week: Not on file    Minutes per session: Not on file  . Stress: Not on file  Relationships   . Social connections:    Talks on phone: Not on file    Gets together: Not on file    Attends religious service: Not on file    Active member of club or organization: Not on file    Attends meetings of clubs or organizations: Not on file    Relationship status: Not on file  . Intimate partner violence:    Fear of current or ex partner: Not on file    Emotionally abused: Not on file    Physically abused: Not on file    Forced sexual activity: Not on file  Other Topics Concern  . Not on file  Social History Narrative  . Not on file    Vital Signs: Blood pressure 120/80, pulse 82, resp. rate 16, height _0  (1.575 m), weight 133 lb (  60.3 kg), SpO2 95 %.  Examination: General Appearance: The patient is well-developed, well-nourished, and in no distress. Skin: Gross inspection of skin unremarkable. Head: normocephalic, no gross deformities. Eyes: no gross deformities noted. ENT: ears appear grossly normal no exudates. Neck: Supple. No thyromegaly. No LAD. Respiratory: no rhonchi noted. Cardiovascular: Normal S1 and S2 without murmur or rub. Extremities: No cyanosis. pulses are equal. Neurologic: Alert and oriented. No involuntary movements.  LABS: Recent Results (from the past 2160 hour(s))  POC Influenza A&B (Binax test)     Status: None   Collection Time: 02/08/18 12:33 PM  Result Value Ref Range   Influenza A, POC Negative Negative   Influenza B, POC Negative Negative  POCT rapid strep A     Status: None   Collection Time: 02/08/18 12:34 PM  Result Value Ref Range   Rapid Strep A Screen Negative Negative  Urinalysis, Routine w reflex microscopic     Status: None   Collection Time: 04/06/18  1:59 PM  Result Value Ref Range   Specific Gravity, UA 1.021 1.005 - 1.030   pH, UA 5.5 5.0 - 7.5   Color, UA Yellow Yellow   Appearance Ur Clear Clear   Leukocytes, UA Negative Negative   Protein, UA Negative Negative/Trace   Glucose, UA Negative Negative   Ketones, UA  Negative Negative   RBC, UA Negative Negative   Bilirubin, UA Negative Negative   Urobilinogen, Ur 0.2 0.2 - 1.0 mg/dL   Nitrite, UA Negative Negative   Microscopic Examination Comment     Comment: Microscopic not indicated and not performed.    Radiology: Dg Chest 2 View  Result Date: 03/12/2018 CLINICAL DATA:  Chronic bronchitis. EXAM: CHEST - 2 VIEW COMPARISON:  02/02/2016 FINDINGS: There is bilateral chronic bilateral interstitial disease. There is no focal consolidation. There is no pleural effusion or pneumothorax. The heart and mediastinal contours are unremarkable. The osseous structures are unremarkable. IMPRESSION: No active cardiopulmonary disease. Electronically Signed   By: Kathreen Devoid   On: 03/12/2018 17:14    No results found.  Dg Chest 2 View  Result Date: 03/12/2018 CLINICAL DATA:  Chronic bronchitis. EXAM: CHEST - 2 VIEW COMPARISON:  02/02/2016 FINDINGS: There is bilateral chronic bilateral interstitial disease. There is no focal consolidation. There is no pleural effusion or pneumothorax. The heart and mediastinal contours are unremarkable. The osseous structures are unremarkable. IMPRESSION: No active cardiopulmonary disease. Electronically Signed   By: Kathreen Devoid   On: 03/12/2018 17:14      Assessment and Plan: Patient Active Problem List   Diagnosis Date Noted  . MAI (mycobacterium avium-intracellulare) ([place]) 02/08/2018  . Numbness and tingling 03/25/2015  . Myopathy 02/19/2015  . Polyneuropathy 02/18/2015  . Weakness of both lower extremities 02/18/2015    1. MAI doing much better at this time on abx. She states that she is taking two but is supposed to be on 3 tabs.   She will check with her pharmacy and she is going to make sure that she has the appropriate amount of medications. 2. COPD   Severe disease the spirometry was reviewed once again with her as well as with her son.  She is to continue with her inhalers as previously ordered. 3. Acute  Bronchitis  This has clinically resolved will continue with supportive care 4. Chronic respiratory failure with hypoxia oxygen therapy will be continue with the son is going to try to see about getting failure a portable concentrator since she is planning going to  decrease  General Counseling: I have discussed the findings of the evaluation and examination with Debi.  I have also discussed any further diagnostic evaluation thatmay be needed or ordered today. Faithanne verbalizes understanding of the findings of todays visit. We also reviewed her medications today and discussed drug interactions and side effects including but not limited excessive drowsiness and altered mental states. We also discussed that there is always a risk not just to her but also people around her. she has been encouraged to call the office with any questions or concerns that should arise related to todays visit.    Time spent: 85mn  I have personally obtained a history, examined the patient, evaluated laboratory and imaging results, formulated the assessment and plan and placed orders.    SAllyne Gee MD FRice Medical CenterPulmonary and Critical Care Sleep medicine

## 2018-04-09 NOTE — Patient Instructions (Signed)

## 2018-04-17 ENCOUNTER — Telehealth: Payer: Self-pay

## 2018-04-20 ENCOUNTER — Telehealth: Payer: Self-pay

## 2018-04-20 ENCOUNTER — Other Ambulatory Visit: Payer: Self-pay | Admitting: Nurse Practitioner

## 2018-04-20 DIAGNOSIS — Z0001 Encounter for general adult medical examination with abnormal findings: Secondary | ICD-10-CM | POA: Diagnosis not present

## 2018-04-20 DIAGNOSIS — E559 Vitamin D deficiency, unspecified: Secondary | ICD-10-CM | POA: Diagnosis not present

## 2018-04-20 DIAGNOSIS — I10 Essential (primary) hypertension: Secondary | ICD-10-CM | POA: Diagnosis not present

## 2018-04-20 NOTE — Telephone Encounter (Signed)
Axed new order to [facility] for portable oxygen.  dbs

## 2018-04-21 LAB — VITAMIN D 25 HYDROXY (VIT D DEFICIENCY, FRACTURES): Vit D, 25-Hydroxy: 55.9 ng/mL (ref 30.0–100.0)

## 2018-04-21 LAB — LIPID PANEL W/O CHOL/HDL RATIO
Cholesterol, Total: 187 mg/dL (ref 100–199)
HDL: 68 mg/dL (ref >39)
LDL Calculated: 99 mg/dL (ref 0–99)
Triglycerides: 98 mg/dL (ref 0–149)
VLDL Cholesterol Cal: 20 mg/dL (ref 5–40)

## 2018-04-21 LAB — CBC
Hematocrit: 37.2 % (ref 34.0–46.6)
Hemoglobin: 12.9 g/dL (ref 11.1–15.9)
MCH: 28.2 pg (ref 26.6–33.0)
MCHC: 34.7 g/dL (ref 31.5–35.7)
MCV: 81 fL (ref 79–97)
Platelets: 295 10*3/uL (ref 150–450)
RBC: 4.57 x10E6/uL (ref 3.77–5.28)
RDW: 14.4 % (ref 12.3–15.4)
WBC: 6.3 10*3/uL (ref 3.4–10.8)

## 2018-04-21 LAB — COMPREHENSIVE METABOLIC PANEL
ALT: 20 IU/L (ref 0–32)
AST: 28 IU/L (ref 0–40)
Albumin/Globulin Ratio: 1.6 (ref 1.2–2.2)
Albumin: 4.2 g/dL (ref 3.5–4.8)
Alkaline Phosphatase: 108 IU/L (ref 39–117)
BUN/Creatinine Ratio: 17 (ref 12–28)
BUN: 11 mg/dL (ref 8–27)
Bilirubin Total: 0.3 mg/dL (ref 0.0–1.2)
CO2: 27 mmol/L (ref 20–29)
Calcium: 9.9 mg/dL (ref 8.7–10.3)
Chloride: 101 mmol/L (ref 96–106)
Creatinine, Ser: 0.66 mg/dL (ref 0.57–1.00)
GFR calc Af Amer: 100 mL/min/{1.73_m2} (ref >59)
GFR calc non Af Amer: 87 mL/min/{1.73_m2} (ref >59)
Globulin, Total: 2.7 g/dL (ref 1.5–4.5)
Glucose: 87 mg/dL (ref 65–99)
Potassium: 5.3 mmol/L — ABNORMAL HIGH (ref 3.5–5.2)
Sodium: 140 mmol/L (ref 134–144)
Total Protein: 6.9 g/dL (ref 6.0–8.5)

## 2018-04-21 LAB — T4, FREE: Free T4: 1.15 ng/dL (ref 0.82–1.77)

## 2018-04-21 LAB — TSH: TSH: 0.942 u[IU]/mL (ref 0.450–4.500)

## 2018-04-23 ENCOUNTER — Other Ambulatory Visit: Payer: Self-pay | Admitting: Nurse Practitioner

## 2018-04-23 DIAGNOSIS — R195 Other fecal abnormalities: Secondary | ICD-10-CM | POA: Diagnosis not present

## 2018-04-23 DIAGNOSIS — Z125 Encounter for screening for malignant neoplasm of prostate: Secondary | ICD-10-CM | POA: Diagnosis not present

## 2018-04-25 LAB — FECAL OCCULT BLOOD, IMMUNOCHEMICAL: Fecal Occult Bld: NEGATIVE

## 2018-04-26 ENCOUNTER — Telehealth: Payer: Self-pay

## 2018-04-26 NOTE — Telephone Encounter (Signed)
Spoke to [employer] at [facility] and she advised that they did receive the order for a lightweight portable oxygen machine.  They stated that the order takes time to process and I spoke to [facility] and she is calling pt and let them know its being processed.  dbs

## 2018-04-29 DIAGNOSIS — Z1239 Encounter for other screening for malignant neoplasm of breast: Secondary | ICD-10-CM | POA: Insufficient documentation

## 2018-04-29 DIAGNOSIS — I10 Essential (primary) hypertension: Secondary | ICD-10-CM | POA: Insufficient documentation

## 2018-04-29 DIAGNOSIS — J449 Chronic obstructive pulmonary disease, unspecified: Secondary | ICD-10-CM | POA: Insufficient documentation

## 2018-05-28 ENCOUNTER — Ambulatory Visit: Payer: Self-pay | Admitting: Internal Medicine

## 2018-07-17 ENCOUNTER — Encounter: Payer: Self-pay | Admitting: Adult Health

## 2018-07-17 ENCOUNTER — Other Ambulatory Visit: Payer: Self-pay | Admitting: Adult Health

## 2018-07-17 ENCOUNTER — Ambulatory Visit (INDEPENDENT_AMBULATORY_CARE_PROVIDER_SITE_OTHER): Payer: Medicare Other | Admitting: Adult Health

## 2018-07-17 ENCOUNTER — Ambulatory Visit: Payer: Medicare Other | Admitting: Nurse Practitioner

## 2018-07-17 VITALS — BP 130/80 | HR 88 | Temp 97.5°F | Resp 16 | Ht 62.0 in | Wt 122.0 lb

## 2018-07-17 DIAGNOSIS — N3001 Acute cystitis with hematuria: Secondary | ICD-10-CM

## 2018-07-17 DIAGNOSIS — M545 Low back pain, unspecified: Secondary | ICD-10-CM

## 2018-07-17 DIAGNOSIS — R300 Dysuria: Secondary | ICD-10-CM

## 2018-07-17 DIAGNOSIS — I10 Essential (primary) hypertension: Secondary | ICD-10-CM

## 2018-07-17 DIAGNOSIS — R109 Unspecified abdominal pain: Secondary | ICD-10-CM | POA: Diagnosis not present

## 2018-07-17 LAB — POCT URINALYSIS DIPSTICK
Bilirubin, UA: NEGATIVE
Glucose, UA: NEGATIVE
Ketones, UA: NEGATIVE
Nitrite, UA: NEGATIVE
Protein, UA: NEGATIVE
Spec Grav, UA: 1.015 (ref 1.010–1.025)
Urobilinogen, UA: 0.2 E.U./dL
pH, UA: 5 (ref 5.0–8.0)

## 2018-07-17 MED ORDER — NITROFURANTOIN MONOHYD MACRO 100 MG PO CAPS
100.0000 mg | ORAL_CAPSULE | Freq: Two times a day (BID) | ORAL | 0 refills | Status: DC
Start: 2018-07-17 — End: 2018-10-15

## 2018-07-17 NOTE — Progress Notes (Signed)
[facility] [place], [place] [address]  Internal MEDICINE  Office Visit Note  Patient Name: Breanna Christian  [id number]  [id number]  Date of Service: 07/17/2018  Chief Complaint  Patient presents with  . Back Pain    going on few days    HPI Pt is here for a sick visit. Pt reports bilateral lower back and flank pain. She reports the pain has been there for a week since she traveled home from [place].  She reports the pain has been getting worse over the last few days.  She reports urinary frequency, and that her urine stops mid stream at times.  She denies hematuria or foul odor. She also denies fever/chills.    Current Medication:  Outpatient Encounter Medications as of 07/17/2018  Medication Sig  . ARTIFICIAL TEAR OP Place 1 drop into both eyes every 6 (six) hours as needed (dry eyes). A product from [place].  Marland Kitchen aspirin EC 81 MG tablet Take 81 mg by mouth at bedtime.   Marland Kitchen azithromycin (ZITHROMAX) 250 MG tablet 1 tab po daily  . budesonide-formoterol (SYMBICORT) 80-4.5 MCG/ACT inhaler Inhale 2 puffs into the lungs 2 times daily at 12 noon and 4 pm.  . Cyanocobalamin (RA VITAMIN B-12 TR) 1000 MCG TBCR Take 1,000 mcg by mouth daily.   Marland Kitchen diltiazem (CARDIZEM CD) 240 MG 24 hr capsule TAKE 1 CAPSULE BY MOUTH EVERY DAY  . diltiazem (TIAZAC) 240 MG 24 hr capsule Take 240 mg by mouth daily.   Marland Kitchen ethambutol (MYAMBUTOL) 400 MG tablet Take 1 tablet (400 mg total) by mouth daily.  . folic acid (FOLVITE) 1 MG tablet Take 1 mg by mouth daily.   Marland Kitchen ibuprofen (ADVIL,MOTRIN) 200 MG tablet Take 200 mg by mouth as needed.  Marland Kitchen ipratropium-albuterol (DUONEB) 0.5-2.5 (3) MG/3ML SOLN USE EVERY 6 HOURS AS NEEDED  . levofloxacin (LEVAQUIN) 500 MG tablet Take 1 tablet (500 mg total) by mouth daily.  . methotrexate (RHEUMATREX) 2.5 MG tablet Take 15 mg by mouth once a week. Takes 6 tablets every Thursday.  . Multiple Vitamin (MULTI-VITAMINS) TABS Take 1 tablet by mouth daily. Reported  on 04/25/2016  . Multiple Vitamins-Minerals (OCUVITE PO) Take 1 tablet by mouth daily.  . Omega-3 Fatty Acids (OMEGA-3 FISH OIL) 300 MG CAPS Take by mouth daily.  . predniSONE (DELTASONE) 10 MG tablet Take 1 tablet (10 mg total) by mouth daily with breakfast.  . PROAIR HFA 108 (90 Base) MCG/ACT inhaler INHALE 2 PUFFS EVERY 6 HOURS AS NEEDED  . rifampin (RIFADIN) 150 MG capsule Take 1 capsule (150 mg total) by mouth 2 (two) times daily.  Marland Kitchen tiotropium (SPIRIVA HANDIHALER) 18 MCG inhalation capsule Place 1 capsule (18 mcg total) into inhaler and inhale daily.  . traZODone (DESYREL) 100 MG tablet Take 100 mg by mouth at bedtime as needed for sleep.  . Vitamin D, Ergocalciferol, (DRISDOL) 50000 units CAPS capsule Take 50,000 Units by mouth every 7 (seven) days. Takes on Sundays   Facility-Administered Encounter Medications as of 07/17/2018  Medication  . ipratropium-albuterol (DUONEB) 0.5-2.5 (3) MG/3ML nebulizer solution 3 mL   Medical History: Past Medical History:  Diagnosis Date  . Bronchiectasis ([place]) 2011  . COPD (chronic obstructive pulmonary disease) ([place])   . HOH (hard of hearing)   . Hypertension   . Lung nodules   . Non-Hodgkin lymphoma ([place]) 2003  . Pneumonia    FREQUENT IN PAST    H/O MYCOBACTERIAL  . Rheumatoid arthritis ([place])   .  Sjogren's syndrome ([place])   . Xerostomia    Vital Signs: BP 130/80   Pulse 88   Temp (!) 97.5 F (36.4 C)   Resp 16   Ht 5\' 2"  (1.575 m)   Wt 122 lb (55.3 kg)   SpO2 96%   BMI 22.31 kg/m   Review of Systems  Constitutional: Negative for chills, fatigue and unexpected weight change.  HENT: Negative for congestion, rhinorrhea, sneezing and sore throat.   Eyes: Negative for photophobia, pain and redness.  Respiratory: Negative for cough, chest tightness and shortness of breath.   Cardiovascular: Negative for chest pain and palpitations.  Gastrointestinal: Negative for abdominal pain, constipation, diarrhea, nausea and vomiting.  Endocrine:  Negative.   Genitourinary: Negative for dysuria and frequency.  Musculoskeletal: Negative for arthralgias, back pain, joint swelling and neck pain.  Skin: Negative for rash.  Allergic/Immunologic: Negative.   Neurological: Negative for tremors and numbness.  Hematological: Negative for adenopathy. Does not bruise/bleed easily.  Psychiatric/Behavioral: Negative for behavioral problems and sleep disturbance. The patient is not nervous/anxious.     Physical Exam  Constitutional: She is oriented to person, place, and time. She appears well-developed and well-nourished. No distress.  HENT:  Head: Normocephalic and atraumatic.  Mouth/Throat: Oropharynx is clear and moist. No oropharyngeal exudate.  Eyes: Pupils are equal, round, and reactive to light. EOM are normal.  Neck: Normal range of motion. Neck supple. No JVD present. No tracheal deviation present. No thyromegaly present.  Cardiovascular: Normal rate, regular rhythm and normal heart sounds. Exam reveals no gallop and no friction rub.  No murmur heard. Pulmonary/Chest: Effort normal and breath sounds normal. No respiratory distress. She has no wheezes. She has no rales. She exhibits no tenderness.  Abdominal: Soft. There is no tenderness. There is CVA tenderness. There is no guarding.  Musculoskeletal: Normal range of motion.       Lumbar back: She exhibits pain. She exhibits no tenderness.  Lymphadenopathy:    She has no cervical adenopathy.  Neurological: She is alert and oriented to person, place, and time. No cranial nerve deficit.  Skin: Skin is warm and dry. She is not diaphoretic.  Psychiatric: She has a normal mood and affect. Her behavior is normal. Judgment and thought content normal.  Nursing note and vitals reviewed.  Assessment/Plan: 1. Acute cystitis with hematuria Follow up as needed. - nitrofurantoin, macrocrystal-monohydrate, (MACROBID) 100 MG capsule; Take 1 capsule (100 mg total) by mouth 2 (two) times daily.   Dispense: 10 capsule; Refill: 0  2. Flank pain, acute - nitrofurantoin, macrocrystal-monohydrate, (MACROBID) 100 MG capsule; Take 1 capsule (100 mg total) by mouth 2 (two) times daily.  Dispense: 10 capsule; Refill: 0  3. Low back pain without sciatica, unspecified back pain laterality, unspecified chronicity Take OTC motrin for pain.  Will follow up if pain does not resolve in 1 week.  - POCT Urinalysis Dipstick  4. Essential hypertension Controlled at this time. Continue current therapy.   General Counseling: Evelyn verbalizes understanding of the findings of todays visit and agrees with plan of treatment. I have discussed any further diagnostic evaluation that may be needed or ordered today. We also reviewed her medications today. she has been encouraged to call the office with any questions or concerns that should arise related to todays visit.   Orders Placed This Encounter  Procedures  . POCT Urinalysis Dipstick   Time spent: 25 Minutes  This patient was seen by Orson Gear AGNP-C in Collaboration with Dr Lavera Guise  as a part of collaborative care agreement

## 2018-07-17 NOTE — Patient Instructions (Signed)

## 2018-07-20 LAB — CULTURE, URINE COMPREHENSIVE

## 2018-07-24 ENCOUNTER — Ambulatory Visit: Payer: Self-pay | Admitting: Internal Medicine

## 2018-07-24 ENCOUNTER — Ambulatory Visit (INDEPENDENT_AMBULATORY_CARE_PROVIDER_SITE_OTHER): Payer: Medicare Other | Admitting: Adult Health

## 2018-07-24 ENCOUNTER — Encounter: Payer: Self-pay | Admitting: Adult Health

## 2018-07-24 VITALS — BP 116/66 | HR 98 | Temp 97.5°F | Resp 16 | Ht 62.0 in | Wt 123.6 lb

## 2018-07-24 DIAGNOSIS — F419 Anxiety disorder, unspecified: Secondary | ICD-10-CM

## 2018-07-24 DIAGNOSIS — M545 Low back pain, unspecified: Secondary | ICD-10-CM

## 2018-07-24 DIAGNOSIS — J449 Chronic obstructive pulmonary disease, unspecified: Secondary | ICD-10-CM | POA: Diagnosis not present

## 2018-07-24 DIAGNOSIS — M62830 Muscle spasm of back: Secondary | ICD-10-CM

## 2018-07-24 DIAGNOSIS — F411 Generalized anxiety disorder: Secondary | ICD-10-CM

## 2018-07-24 MED ORDER — CLONAZEPAM 0.5 MG PO TABS: 0.2500 mg | ORAL_TABLET | ORAL | 0 refills | Status: DC | PRN

## 2018-07-24 MED ORDER — CYCLOBENZAPRINE HCL 10 MG PO TABS: 10.0000 mg | ORAL_TABLET | Freq: Every day | ORAL | 0 refills | Status: DC

## 2018-07-24 NOTE — Patient Instructions (Signed)

## 2018-07-24 NOTE — Progress Notes (Signed)
[facility] [place], [place] [address]  Internal MEDICINE  Office Visit Note  Patient Name: Breanna Christian  [id number]  [id number]  Date of Service: 08/05/2018  Chief Complaint  Patient presents with  . Back Pain    left lower back , no clearance with antibiotic   . Shortness of Breath    HPI Pt is here for a sick visit.  Pt has returned today for continued left flank/back pain. She finished her antibiotics and continues to have pain that is so sharp it makes her body "jerk" and is like a catching motion.  She has been trying to rest, with no success.  It has now been 12 days and she would like to have an MRI of her back. Pt feels like to be more sob with this pain  Current Medication:  Outpatient Encounter Medications as of 07/24/2018  Medication Sig  . ARTIFICIAL TEAR OP Place 1 drop into both eyes every 6 (six) hours as needed (dry eyes). A product from [place].  Marland Kitchen aspirin EC 81 MG tablet Take 81 mg by mouth at bedtime.   . budesonide-formoterol (SYMBICORT) 80-4.5 MCG/ACT inhaler Inhale 2 puffs into the lungs 2 times daily at 12 noon and 4 pm.  . Cyanocobalamin (RA VITAMIN B-12 TR) 1000 MCG TBCR Take 1,000 mcg by mouth daily.   Marland Kitchen diltiazem (CARDIZEM CD) 240 MG 24 hr capsule TAKE 1 CAPSULE BY MOUTH EVERY DAY  . diltiazem (TIAZAC) 240 MG 24 hr capsule Take 240 mg by mouth daily.   Marland Kitchen ethambutol (MYAMBUTOL) 400 MG tablet Take 1 tablet (400 mg total) by mouth daily.  . folic acid (FOLVITE) 1 MG tablet Take 1 mg by mouth daily.   Marland Kitchen ibuprofen (ADVIL,MOTRIN) 200 MG tablet Take 200 mg by mouth as needed.  Marland Kitchen ipratropium-albuterol (DUONEB) 0.5-2.5 (3) MG/3ML SOLN USE EVERY 6 HOURS AS NEEDED  . levofloxacin (LEVAQUIN) 500 MG tablet Take 1 tablet (500 mg total) by mouth daily.  . methotrexate (RHEUMATREX) 2.5 MG tablet Take 15 mg by mouth once a week. Takes 6 tablets every Thursday.  . Multiple Vitamin (MULTI-VITAMINS) TABS Take 1 tablet by mouth daily. Reported  on 04/25/2016  . Multiple Vitamins-Minerals (OCUVITE PO) Take 1 tablet by mouth daily.  . Omega-3 Fatty Acids (OMEGA-3 FISH OIL) 300 MG CAPS Take by mouth daily.  . predniSONE (DELTASONE) 10 MG tablet Take 1 tablet (10 mg total) by mouth daily with breakfast.  . PROAIR HFA 108 (90 Base) MCG/ACT inhaler INHALE 2 PUFFS EVERY 6 HOURS AS NEEDED  . rifampin (RIFADIN) 150 MG capsule Take 1 capsule (150 mg total) by mouth 2 (two) times daily.  Marland Kitchen tiotropium (SPIRIVA HANDIHALER) 18 MCG inhalation capsule Place 1 capsule (18 mcg total) into inhaler and inhale daily.  . traZODone (DESYREL) 100 MG tablet Take 100 mg by mouth at bedtime as needed for sleep.  . Vitamin D, Ergocalciferol, (DRISDOL) 50000 units CAPS capsule Take 50,000 Units by mouth every 7 (seven) days. Takes on Sundays  . azithromycin (ZITHROMAX) 250 MG tablet 1 tab po daily (Patient not taking: Reported on 07/24/2018)  . clonazePAM (KLONOPIN) 0.5 MG tablet Take 0.5 tablets (0.25 mg total) by mouth as needed for anxiety (Take one Pill before MRI).  Marland Kitchen cyclobenzaprine (FLEXERIL) 10 MG tablet Take 1 tablet (10 mg total) by mouth at bedtime.  . nitrofurantoin, macrocrystal-monohydrate, (MACROBID) 100 MG capsule Take 1 capsule (100 mg total) by mouth 2 (two) times daily. (Patient not taking: Reported  on 07/24/2018)   Facility-Administered Encounter Medications as of 07/24/2018  Medication  . ipratropium-albuterol (DUONEB) 0.5-2.5 (3) MG/3ML nebulizer solution 3 mL      Medical History: Past Medical History:  Diagnosis Date  . Bronchiectasis ([place]) 2011  . COPD (chronic obstructive pulmonary disease) ([place])   . HOH (hard of hearing)   . Hypertension   . Lung nodules   . Non-Hodgkin lymphoma ([place]) 2003  . Pneumonia    FREQUENT IN PAST    H/O MYCOBACTERIAL  . Rheumatoid arthritis ([place])   . Sjogren's syndrome ([place])   . Xerostomia    Vital Signs: BP 116/66   Pulse 98   Temp (!) 97.5 F (36.4 C)   Resp 16   Ht 5\' 2"  (1.575 m)   Wt 123 lb  9.6 oz (56.1 kg)   SpO2 98%   BMI 22.61 kg/m   Review of Systems  Constitutional: Negative for chills, fatigue and unexpected weight change.  HENT: Negative for congestion, rhinorrhea, sneezing and sore throat.   Eyes: Negative for photophobia, pain and redness.  Respiratory: Negative for cough, chest tightness and shortness of breath.   Cardiovascular: Negative for chest pain and palpitations.  Gastrointestinal: Negative for abdominal pain, constipation, diarrhea, nausea and vomiting.  Endocrine: Negative.   Genitourinary: Negative for dysuria and frequency.  Musculoskeletal: Negative for arthralgias, back pain, joint swelling and neck pain.  Skin: Negative for rash.  Allergic/Immunologic: Negative.   Neurological: Negative for tremors and numbness.  Hematological: Negative for adenopathy. Does not bruise/bleed easily.  Psychiatric/Behavioral: Negative for behavioral problems and sleep disturbance. The patient is not nervous/anxious.    Physical Exam  Constitutional: She is oriented to person, place, and time. She appears well-developed and well-nourished. No distress.  HENT:  Head: Normocephalic and atraumatic.  Mouth/Throat: Oropharynx is clear and moist. No oropharyngeal exudate.  Eyes: Pupils are equal, round, and reactive to light. EOM are normal.  Neck: Normal range of motion. Neck supple. No JVD present. No tracheal deviation present. No thyromegaly present.  Cardiovascular: Normal rate, regular rhythm and normal heart sounds. Exam reveals no gallop and no friction rub.  No murmur heard. Pulmonary/Chest: Effort normal and breath sounds normal. No respiratory distress. She has no wheezes. She has no rales. She exhibits no tenderness.  Abdominal: Soft. There is no tenderness. There is no guarding.  Musculoskeletal: Normal range of motion.  Lymphadenopathy:    She has no cervical adenopathy.  Neurological: She is alert and oriented to person, place, and time. No cranial nerve  deficit.  Skin: Skin is warm and dry. She is not diaphoretic.  Psychiatric: She has a normal mood and affect. Her behavior is normal. Judgment and thought content normal.  Nursing note and vitals reviewed.  Assessment/Plan: 1. Muscle spasm of back Use Flexeril at night while sleeping.  - cyclobenzaprine (FLEXERIL) 10 MG tablet; Take 1 tablet (10 mg total) by mouth at bedtime.  Dispense: 15 tablet; Refill: 0 -Lumbar Spine MRI. 2. Low back pain without sciatica, unspecified back pain laterality, unspecified chronicity Back pain continues at this time.  Will follow up with MRI.   3. Anxiety due to invasive procedure Take clonazepam before MRI.   - clonazePAM (KLONOPIN) 0.5 MG tablet; Take 0.5 tablets (0.25 mg total) by mouth as needed for anxiety (Take one Pill before MRI).  Dispense: 4 tablet; Refill: 0  4. Obstructive chronic bronchitis without exacerbation (HCC) Increased sob might be related to back pain, continue MDI, might need optimization of therapy  General Counseling: Blayre verbalizes understanding of the findings of todays visit and agrees with plan of treatment. I have discussed any further diagnostic evaluation that may be needed or ordered today. We also reviewed her medications today. she has been encouraged to call the office with any questions or concerns that should arise related to todays visit.  Orders Placed This Encounter  Procedures  . MR LUMBAR SPINE WO CONTRAST    Meds ordered this encounter  Medications  . cyclobenzaprine (FLEXERIL) 10 MG tablet    Sig: Take 1 tablet (10 mg total) by mouth at bedtime.    Dispense:  15 tablet    Refill:  0  . clonazePAM (KLONOPIN) 0.5 MG tablet    Sig: Take 0.5 tablets (0.25 mg total) by mouth as needed for anxiety (Take one Pill before MRI).    Dispense:  4 tablet    Refill:  0    Time spent: 25 Minutes  This patient was seen by Orson Gear AGNP-C in Collaboration with Dr Lavera Guise as a part of collaborative  care agreement

## 2018-08-06 ENCOUNTER — Ambulatory Visit: Payer: Self-pay | Admitting: Adult Health

## 2018-08-14 ENCOUNTER — Ambulatory Visit: Payer: Medicare Other

## 2018-08-17 ENCOUNTER — Ambulatory Visit: Payer: Self-pay | Admitting: Adult Health

## 2018-09-09 ENCOUNTER — Other Ambulatory Visit: Payer: Self-pay | Admitting: Internal Medicine

## 2018-09-10 ENCOUNTER — Other Ambulatory Visit: Payer: Self-pay

## 2018-10-08 ENCOUNTER — Ambulatory Visit (INDEPENDENT_AMBULATORY_CARE_PROVIDER_SITE_OTHER): Payer: Medicare Other | Admitting: Internal Medicine

## 2018-10-08 ENCOUNTER — Encounter: Payer: Self-pay | Admitting: Internal Medicine

## 2018-10-08 VITALS — BP 136/72 | HR 90 | Resp 16 | Ht 62.0 in | Wt 128.0 lb

## 2018-10-08 DIAGNOSIS — A310 Pulmonary mycobacterial infection: Secondary | ICD-10-CM | POA: Diagnosis not present

## 2018-10-08 DIAGNOSIS — R0602 Shortness of breath: Secondary | ICD-10-CM

## 2018-10-08 DIAGNOSIS — J9611 Chronic respiratory failure with hypoxia: Secondary | ICD-10-CM

## 2018-10-08 NOTE — Patient Instructions (Signed)

## 2018-10-08 NOTE — Progress Notes (Signed)
[facility] [place], [place] [address]  Pulmonary Sleep Medicine   Office Visit Note  Patient Name: Breanna Christian DOB: 1941/09/29 MRN [id number]  Date of Service: 10/08/2018  Complaints/HPI: Pt is here for follow up on MAI, copd, and Chronic respiratory failure with hypoxia.  She denies any difficulty recently. She denies any recent hospitalization.  She is using her inhalers, and nebulizer as directed.  She denies hemoptysis, fever or cough at this time.      ROS  General: (-) fever, (-) chills, (-) night sweats, (-) weakness Skin: (-) rashes, (-) itching,. Eyes: (-) visual changes, (-) redness, (-) itching. Nose and Sinuses: (-) nasal stuffiness or itchiness, (-) postnasal drip, (-) nosebleeds, (-) sinus trouble. Mouth and Throat: (-) sore throat, (-) hoarseness. Neck: (-) swollen glands, (-) enlarged thyroid, (-) neck pain. Respiratory: - cough, (-) bloody sputum, - shortness of breath, - wheezing. Cardiovascular: - ankle swelling, (-) chest pain. Lymphatic: (-) lymph node enlargement. Neurologic: (-) numbness, (-) tingling. Psychiatric: (-) anxiety, (-) depression   Current Medication: Outpatient Encounter Medications as of 10/08/2018  Medication Sig  . ARTIFICIAL TEAR OP Place 1 drop into both eyes every 6 (six) hours as needed (dry eyes). A product from [place].  Marland Kitchen aspirin EC 81 MG tablet Take 81 mg by mouth at bedtime.   . budesonide-formoterol (SYMBICORT) 80-4.5 MCG/ACT inhaler Inhale 2 puffs into the lungs 2 times daily at 12 noon and 4 pm.  . clonazePAM (KLONOPIN) 0.5 MG tablet Take 0.5 tablets (0.25 mg total) by mouth as needed for anxiety (Take one Pill before MRI).  . Cyanocobalamin (RA VITAMIN B-12 TR) 1000 MCG TBCR Take 1,000 mcg by mouth daily.   . cyclobenzaprine (FLEXERIL) 10 MG tablet Take 1 tablet (10 mg total) by mouth at bedtime.  Marland Kitchen diltiazem (CARDIZEM CD) 240 MG 24 hr capsule TAKE 1 CAPSULE BY MOUTH EVERY DAY  . ethambutol  (MYAMBUTOL) 400 MG tablet Take 1 tablet (400 mg total) by mouth daily.  . folic acid (FOLVITE) 1 MG tablet Take 1 mg by mouth daily.   Marland Kitchen ibuprofen (ADVIL,MOTRIN) 200 MG tablet Take 200 mg by mouth as needed.  Marland Kitchen ipratropium-albuterol (DUONEB) 0.5-2.5 (3) MG/3ML SOLN USE EVERY 6 HOURS AS NEEDED  . methotrexate (RHEUMATREX) 2.5 MG tablet Take 15 mg by mouth once a week. Takes 6 tablets every Thursday.  . Multiple Vitamin (MULTI-VITAMINS) TABS Take 1 tablet by mouth daily. Reported on 04/25/2016  . Multiple Vitamins-Minerals (OCUVITE PO) Take 1 tablet by mouth daily.  . nitrofurantoin, macrocrystal-monohydrate, (MACROBID) 100 MG capsule Take 1 capsule (100 mg total) by mouth 2 (two) times daily.  . Omega-3 Fatty Acids (OMEGA-3 FISH OIL) 300 MG CAPS Take by mouth daily.  . predniSONE (DELTASONE) 10 MG tablet Take 1 tablet (10 mg total) by mouth daily with breakfast.  . PROAIR HFA 108 (90 Base) MCG/ACT inhaler INHALE 2 PUFFS EVERY 6 HOURS AS NEEDED  . rifampin (RIFADIN) 150 MG capsule Take 1 capsule (150 mg total) by mouth 2 (two) times daily.  Marland Kitchen tiotropium (SPIRIVA HANDIHALER) 18 MCG inhalation capsule Place 1 capsule (18 mcg total) into inhaler and inhale daily.  . traZODone (DESYREL) 100 MG tablet Take 100 mg by mouth at bedtime as needed for sleep.  . Vitamin D, Ergocalciferol, (DRISDOL) 50000 units CAPS capsule Take 50,000 Units by mouth every 7 (seven) days. Takes on Sundays  . azithromycin (ZITHROMAX) 250 MG tablet 1 tab po daily (Patient not taking: Reported on  10/08/2018)  . levofloxacin (LEVAQUIN) 500 MG tablet Take 1 tablet (500 mg total) by mouth daily. (Patient not taking: Reported on 10/08/2018)   Facility-Administered Encounter Medications as of 10/08/2018  Medication  . ipratropium-albuterol (DUONEB) 0.5-2.5 (3) MG/3ML nebulizer solution 3 mL    Surgical History: Past Surgical History:  Procedure Laterality Date  . APPENDECTOMY    . BRONCHOSCOPY  02/2016  . CATARACT EXTRACTION  W/PHACO Right 10/26/2016   Procedure: CATARACT EXTRACTION PHACO AND INTRAOCULAR LENS PLACEMENT (IOC);  Surgeon: Estill Cotta, MD;  Location: [facility] ORS;  Service: Ophthalmology;  Laterality: Right;  [place] 2.17 AP% 26.5 CDE 56.86 Fluid pack lot # [id number]  . FLEXIBLE BRONCHOSCOPY Bilateral 03/16/2016   Procedure: FLEXIBLE BRONCHOSCOPY;  Surgeon: Allyne Gee, MD;  Location: [facility] ORS;  Service: Pulmonary;  Laterality: Bilateral;  . FLEXIBLE BRONCHOSCOPY N/A 03/03/2017   Procedure: FLEXIBLE BRONCHOSCOPY;  Surgeon: Allyne Gee, MD;  Location: [facility] ORS;  Service: Pulmonary;  Laterality: N/A;  . TUBAL LIGATION      Medical History: Past Medical History:  Diagnosis Date  . Bronchiectasis ([place]) 2011  . COPD (chronic obstructive pulmonary disease) ([place])   . HOH (hard of hearing)   . Hypertension   . Lung nodules   . Non-Hodgkin lymphoma ([place]) 2003  . Pneumonia    FREQUENT IN PAST    H/O MYCOBACTERIAL  . Rheumatoid arthritis ([place])   . Sjogren's syndrome ([place])   . Xerostomia     Family History: No family history on file.  Social History: Social History   Socioeconomic History  . Marital status: Married    Spouse name: Not on file  . Number of children: Not on file  . Years of education: Not on file  . Highest education level: Not on file  Occupational History  . Not on file  Social Needs  . Financial resource strain: Not on file  . Food insecurity:    Worry: Not on file    Inability: Not on file  . Transportation needs:    Medical: Not on file    Non-medical: Not on file  Tobacco Use  . Smoking status: Former Smoker    Last attempt to quit: 09/01/1997    Years since quitting: 21.1  . Smokeless tobacco: Never Used  Substance and Sexual Activity  . Alcohol use: No  . Drug use: No  . Sexual activity: Not on file  Lifestyle  . Physical activity:    Days per week: Not on file    Minutes per session: Not on file  . Stress: Not on file  Relationships  . Social  connections:    Talks on phone: Not on file    Gets together: Not on file    Attends religious service: Not on file    Active member of club or organization: Not on file    Attends meetings of clubs or organizations: Not on file    Relationship status: Not on file  . Intimate partner violence:    Fear of current or ex partner: Not on file    Emotionally abused: Not on file    Physically abused: Not on file    Forced sexual activity: Not on file  Other Topics Concern  . Not on file  Social History Narrative  . Not on file    Vital Signs: Blood pressure 136/72, pulse 90, resp. rate 16, height _0  (1.575 m), weight 128 lb (58.1 kg), SpO2 97 %.  Examination: General Appearance: The patient is  well-developed, well-nourished, and in no distress. Skin: Gross inspection of skin unremarkable. Head: normocephalic, no gross deformities. Eyes: no gross deformities noted. ENT: ears appear grossly normal no exudates. Neck: Supple. No thyromegaly. No LAD. Respiratory: Clear bilateraly. Cardiovascular: Normal S1 and S2 without murmur or rub. Extremities: No cyanosis. pulses are equal. Neurologic: Alert and oriented. No involuntary movements.  LABS: Recent Results (from the past 2160 hour(s))  CULTURE, URINE COMPREHENSIVE     Status: Abnormal   Collection Time: 07/17/18 12:00 AM  Result Value Ref Range   Urine Culture, Comprehensive Final report (A)    Organism ID, Bacteria Enterococcus faecalis (A)     Comment: Greater than 100,000 colony forming units per mL   ANTIMICROBIAL SUSCEPTIBILITY Comment     Comment:       ** S = Susceptible; I = Intermediate; R = Resistant **                    P = Positive; N = Negative             MICS are expressed in micrograms per mL    Antibiotic                 RSLT#1    RSLT#2    RSLT#3    RSLT#4 Ciprofloxacin                  R Levofloxacin                   R Nitrofurantoin                 S Penicillin                     S Tetracycline                    R Vancomycin                     S   POCT Urinalysis Dipstick     Status: Abnormal   Collection Time: 07/17/18 11:40 AM  Result Value Ref Range   Color, UA     Clarity, UA     Glucose, UA Negative Negative   Bilirubin, UA Negative    Ketones, UA Negative    Spec Grav, UA 1.015 1.010 - 1.025   Blood, UA trace    pH, UA 5.0 5.0 - 8.0   Protein, UA Negative Negative   Urobilinogen, UA 0.2 0.2 or 1.0 E.U./dL   Nitrite, UA Negative    Leukocytes, UA Trace (A) Negative   Appearance     Odor      Radiology: Dg Chest 2 View  Result Date: 03/12/2018 CLINICAL DATA:  Chronic bronchitis. EXAM: CHEST - 2 VIEW COMPARISON:  02/02/2016 FINDINGS: There is bilateral chronic bilateral interstitial disease. There is no focal consolidation. There is no pleural effusion or pneumothorax. The heart and mediastinal contours are unremarkable. The osseous structures are unremarkable. IMPRESSION: No active cardiopulmonary disease. Electronically Signed   By: Kathreen Devoid   On: 03/12/2018 17:14    No results found.  No results found.    Assessment and Plan: Patient Active Problem List   Diagnosis Date Noted  . Screening for breast cancer 04/29/2018  . Essential hypertension 04/29/2018  . Obstructive chronic bronchitis without exacerbation ([place]) 04/29/2018  . MAI (mycobacterium avium-intracellulare) ([place]) 02/08/2018  . Numbness and tingling 03/25/2015  . Myopathy 02/19/2015  .  Polyneuropathy 02/18/2015  . Weakness of both lower extremities 02/18/2015   1. Pulmonary MAI (mycobacterium avium-intracellulare) infection ([place]) Patient is stable and is doing well.  She continues to take antibiotics as prescribed.  2. Chronic respiratory failure with hypoxia (HCC) Patient has not had PFTs in 18 months.  PFTs ordered today for future visit.  Patient has oxygen at home however states she has not used it in quite some time as she is not felt like she needed it. - Pulmonary Function Test;  Future  3. SOB (shortness of breath) Patient's FEV1/FVC is 64% of pre-predicted value at today's spirometry. - Spirometry with Graph  General Counseling: I have discussed the findings of the evaluation and examination with Breelle.  I have also discussed any further diagnostic evaluation thatmay be needed or ordered today. Dwayna verbalizes understanding of the findings of todays visit. We also reviewed her medications today and discussed drug interactions and side effects including but not limited excessive drowsiness and altered mental states. We also discussed that there is always a risk not just to her but also people around her. she has been encouraged to call the office with any questions or concerns that should arise related to todays visit.    Time spent: 25 This patient was seen by Orson Gear AGNP-C in Collaboration with Dr. Devona Konig as a part of collaborative care agreement.   I have personally obtained a history, examined the patient, evaluated laboratory and imaging results, formulated the assessment and plan and placed orders.    Allyne Gee, MD [facility] Pulmonary and Critical Care Sleep medicine

## 2018-10-10 DIAGNOSIS — Z23 Encounter for immunization: Secondary | ICD-10-CM | POA: Diagnosis not present

## 2018-10-15 ENCOUNTER — Encounter: Payer: Self-pay | Admitting: Nurse Practitioner

## 2018-10-15 ENCOUNTER — Other Ambulatory Visit: Payer: Self-pay | Admitting: Nurse Practitioner

## 2018-10-15 ENCOUNTER — Ambulatory Visit (INDEPENDENT_AMBULATORY_CARE_PROVIDER_SITE_OTHER): Payer: Medicare Other | Admitting: Nurse Practitioner

## 2018-10-15 VITALS — BP 134/70 | HR 95 | Resp 16 | Ht 62.0 in | Wt 131.2 lb

## 2018-10-15 DIAGNOSIS — Z1239 Encounter for other screening for malignant neoplasm of breast: Secondary | ICD-10-CM | POA: Diagnosis not present

## 2018-10-15 DIAGNOSIS — J449 Chronic obstructive pulmonary disease, unspecified: Secondary | ICD-10-CM

## 2018-10-15 DIAGNOSIS — I10 Essential (primary) hypertension: Secondary | ICD-10-CM

## 2018-10-15 DIAGNOSIS — E2839 Other primary ovarian failure: Secondary | ICD-10-CM

## 2018-10-15 DIAGNOSIS — M810 Age-related osteoporosis without current pathological fracture: Secondary | ICD-10-CM | POA: Diagnosis not present

## 2018-10-15 DIAGNOSIS — Z1231 Encounter for screening mammogram for malignant neoplasm of breast: Secondary | ICD-10-CM

## 2018-10-15 NOTE — Progress Notes (Signed)
[facility] [place], [place] [address]  Internal MEDICINE  Office Visit Note  Patient Name: Breanna Christian  [id number]  [id number]  Date of Service: 10/15/2018  Chief Complaint  Patient presents with  . Medical Management of Chronic Issues    6 month follow up  . Quality Metric Gaps    bone density, pneumovax, flu vaccine    The patient is here for routine follow up exam. Her main concern is shortness of breath with exertion. She does have COPD. Uses spiriva every day. Takes rescue inhaler when needed. Uses oxygen at night. She is scheduled to have PFT 10/31/2018. She also states that hearing is declining. She is concerned about wax build up in her ears contributing to this issue.  She is doe to have a screening mammogram and a bone density test. States that she has previously been diagnosed with osteoporosis and used to take prolia injections, but stopped and has not had a bone density test since then. She believes she had pneumonia vaccines when she was with prior PCP.       Current Medication: Outpatient Encounter Medications as of 10/15/2018  Medication Sig  . ARTIFICIAL TEAR OP Place 1 drop into both eyes every 6 (six) hours as needed (dry eyes). A product from [place].  Marland Kitchen aspirin EC 81 MG tablet Take 81 mg by mouth at bedtime.   . budesonide-formoterol (SYMBICORT) 80-4.5 MCG/ACT inhaler Inhale 2 puffs into the lungs 2 times daily at 12 noon and 4 pm.  . clonazePAM (KLONOPIN) 0.5 MG tablet Take 0.5 tablets (0.25 mg total) by mouth as needed for anxiety (Take one Pill before MRI).  . Cyanocobalamin (RA VITAMIN B-12 TR) 1000 MCG TBCR Take 1,000 mcg by mouth daily.   . cyclobenzaprine (FLEXERIL) 10 MG tablet Take 1 tablet (10 mg total) by mouth at bedtime.  Marland Kitchen diltiazem (CARDIZEM CD) 240 MG 24 hr capsule TAKE 1 CAPSULE BY MOUTH EVERY DAY  . ethambutol (MYAMBUTOL) 400 MG tablet Take 1 tablet (400 mg total) by mouth daily.  . folic acid (FOLVITE) 1 MG  tablet Take 1 mg by mouth daily.   Marland Kitchen ibuprofen (ADVIL,MOTRIN) 200 MG tablet Take 200 mg by mouth as needed.  Marland Kitchen ipratropium-albuterol (DUONEB) 0.5-2.5 (3) MG/3ML SOLN USE EVERY 6 HOURS AS NEEDED  . levofloxacin (LEVAQUIN) 500 MG tablet Take 1 tablet (500 mg total) by mouth daily.  . methotrexate (RHEUMATREX) 2.5 MG tablet Take 15 mg by mouth once a week. Takes 6 tablets every Thursday.  . Multiple Vitamin (MULTI-VITAMINS) TABS Take 1 tablet by mouth daily. Reported on 04/25/2016  . Multiple Vitamins-Minerals (OCUVITE PO) Take 1 tablet by mouth daily.  . Omega-3 Fatty Acids (OMEGA-3 FISH OIL) 300 MG CAPS Take by mouth daily.  . predniSONE (DELTASONE) 10 MG tablet Take 1 tablet (10 mg total) by mouth daily with breakfast.  . PROAIR HFA 108 (90 Base) MCG/ACT inhaler INHALE 2 PUFFS EVERY 6 HOURS AS NEEDED  . rifampin (RIFADIN) 150 MG capsule Take 1 capsule (150 mg total) by mouth 2 (two) times daily.  Marland Kitchen tiotropium (SPIRIVA HANDIHALER) 18 MCG inhalation capsule Place 1 capsule (18 mcg total) into inhaler and inhale daily.  . traZODone (DESYREL) 100 MG tablet Take 100 mg by mouth at bedtime as needed for sleep.  . Vitamin D, Ergocalciferol, (DRISDOL) 50000 units CAPS capsule Take 50,000 Units by mouth every 7 (seven) days. Takes on Sundays  . [DISCONTINUED] azithromycin (ZITHROMAX) 250 MG tablet 1 tab po  daily (Patient not taking: Reported on 10/08/2018)  . [DISCONTINUED] nitrofurantoin, macrocrystal-monohydrate, (MACROBID) 100 MG capsule Take 1 capsule (100 mg total) by mouth 2 (two) times daily. (Patient not taking: Reported on 10/15/2018)   Facility-Administered Encounter Medications as of 10/15/2018  Medication  . ipratropium-albuterol (DUONEB) 0.5-2.5 (3) MG/3ML nebulizer solution 3 mL    Surgical History: Past Surgical History:  Procedure Laterality Date  . APPENDECTOMY    . BRONCHOSCOPY  02/2016  . CATARACT EXTRACTION W/PHACO Right 10/26/2016   Procedure: CATARACT EXTRACTION PHACO AND  INTRAOCULAR LENS PLACEMENT (IOC);  Surgeon: Estill Cotta, MD;  Location: [facility] ORS;  Service: Ophthalmology;  Laterality: Right;  [place] 2.17 AP% 26.5 CDE 56.86 Fluid pack lot # [id number]  . FLEXIBLE BRONCHOSCOPY Bilateral 03/16/2016   Procedure: FLEXIBLE BRONCHOSCOPY;  Surgeon: Allyne Gee, MD;  Location: [facility] ORS;  Service: Pulmonary;  Laterality: Bilateral;  . FLEXIBLE BRONCHOSCOPY N/A 03/03/2017   Procedure: FLEXIBLE BRONCHOSCOPY;  Surgeon: Allyne Gee, MD;  Location: [facility] ORS;  Service: Pulmonary;  Laterality: N/A;  . TUBAL LIGATION      Medical History: Past Medical History:  Diagnosis Date  . Bronchiectasis ([place]) 2011  . COPD (chronic obstructive pulmonary disease) ([place])   . HOH (hard of hearing)   . Hypertension   . Lung nodules   . Non-Hodgkin lymphoma ([place]) 2003  . Pneumonia    FREQUENT IN PAST    H/O MYCOBACTERIAL  . Rheumatoid arthritis ([place])   . Sjogren's syndrome ([place])   . Xerostomia     Family History: History reviewed. No pertinent family history.  Social History   Socioeconomic History  . Marital status: Married    Spouse name: Not on file  . Number of children: Not on file  . Years of education: Not on file  . Highest education level: Not on file  Occupational History  . Not on file  Social Needs  . Financial resource strain: Not on file  . Food insecurity:    Worry: Not on file    Inability: Not on file  . Transportation needs:    Medical: Not on file    Non-medical: Not on file  Tobacco Use  . Smoking status: Former Smoker    Last attempt to quit: 09/01/1997    Years since quitting: 21.1  . Smokeless tobacco: Never Used  Substance and Sexual Activity  . Alcohol use: No  . Drug use: No  . Sexual activity: Not on file  Lifestyle  . Physical activity:    Days per week: Not on file    Minutes per session: Not on file  . Stress: Not on file  Relationships  . Social connections:    Talks on phone: Not on file    Gets together: Not on  file    Attends religious service: Not on file    Active member of club or organization: Not on file    Attends meetings of clubs or organizations: Not on file    Relationship status: Not on file  . Intimate partner violence:    Fear of current or ex partner: Not on file    Emotionally abused: Not on file    Physically abused: Not on file    Forced sexual activity: Not on file  Other Topics Concern  . Not on file  Social History Narrative  . Not on file      Review of Systems  Constitutional: Negative for activity change, chills, fatigue and unexpected weight change.  HENT: Negative  for congestion, postnasal drip, rhinorrhea, sneezing and sore throat.   Eyes: Negative.  Negative for redness.  Respiratory: Positive for shortness of breath and wheezing. Negative for cough and chest tightness.        Generally well-managed   Cardiovascular: Negative for chest pain and palpitations.  Gastrointestinal: Negative for abdominal pain, constipation, diarrhea, nausea and vomiting.  Endocrine: Negative for cold intolerance, heat intolerance, polydipsia, polyphagia and polyuria.  Genitourinary: Negative for dysuria and frequency.  Musculoskeletal: Negative for arthralgias, back pain, joint swelling and neck pain.  Skin: Negative for rash.  Allergic/Immunologic: Positive for environmental allergies.  Neurological: Negative for dizziness, tremors, numbness and headaches.  Hematological: Negative for adenopathy. Does not bruise/bleed easily.  Psychiatric/Behavioral: Negative for behavioral problems (Depression), dysphoric mood, sleep disturbance and suicidal ideas. The patient is not nervous/anxious.     Today's Vitals   10/15/18 1034  BP: 134/70  Pulse: 95  Resp: 16  SpO2: 98%  Weight: 131 lb 3.2 oz (59.5 kg)  Height: _0  (1.575 m)    Physical Exam  Constitutional: She is oriented to person, place, and time. She appears well-developed and well-nourished. No distress.  HENT:  Head:  Normocephalic and atraumatic.  Right Ear: External ear normal.  Left Ear: External ear normal.  Nose: Nose normal.  Mouth/Throat: Oropharynx is clear and moist. No oropharyngeal exudate.  Eyes: Pupils are equal, round, and reactive to light. Conjunctivae and EOM are normal.  Neck: Normal range of motion. Neck supple. No JVD present. Carotid bruit is not present. No tracheal deviation present. No thyromegaly present.  Cardiovascular: Normal rate and regular rhythm. Exam reveals no gallop and no friction rub.  Murmur heard. Pulmonary/Chest: Effort normal and breath sounds normal. No respiratory distress. She has no wheezes. She has no rales. She exhibits no tenderness.  Some mild congestion which clears with cough.   Abdominal: Soft. Bowel sounds are normal. There is no tenderness.  Musculoskeletal: Normal range of motion.  Lymphadenopathy:    She has no cervical adenopathy.  Neurological: She is alert and oriented to person, place, and time. She displays normal reflexes. No cranial nerve deficit. Coordination normal.  Skin: Skin is warm and dry. Capillary refill takes 2 to 3 seconds. She is not diaphoretic.  Psychiatric: She has a normal mood and affect. Her behavior is normal. Judgment and thought content normal.  Nursing note and vitals reviewed.   Assessment/Plan: 1. Essential hypertension Stable. Continue bp medication as prescribed   2. Obstructive chronic bronchitis without exacerbation (HCC) Generally stable. Continue to use inhalers and respiratory medications as prescribed. PFT scheduled for 10/31/2018. Follow up with Dr. Devona Konig as scheduled.   3. Age related osteoporosis, unspecified pathological fracture presence Bone density test ordered. Will begin treatment as indicated.   4. Ovarian failure - DG Bone Density; Future  5. Screening for breast cancer Screening mammogram ordered   General Counseling: Lashawndra verbalizes understanding of the findings of todays  visit and agrees with plan of treatment. I have discussed any further diagnostic evaluation that may be needed or ordered today. We also reviewed her medications today. she has been encouraged to call the office with any questions or concerns that should arise related to todays visit.  Hypertension Counseling:   The following hypertensive lifestyle modification were recommended and discussed:  1. Limiting alcohol intake to less than 1 oz/day of ethanol:(24 oz of beer or 8 oz of wine or 2 oz of 100-proof whiskey). 2. Take baby ASA 81 mg daily. 3.  Importance of regular aerobic exercise and losing weight. 4. Reduce dietary saturated fat and cholesterol intake for overall cardiovascular health. 5. Maintaining adequate dietary potassium, calcium, and magnesium intake. 6. Regular monitoring of the blood pressure. 7. Reduce sodium intake to less than 100 mmol/day (less than 2.3 gm of sodium or less than 6 gm of sodium choride)    This patient was seen by [place] with Dr Lavera Guise as a part of collaborative care agreement  Orders Placed This Encounter  Procedures  . DG Bone Density     Time spent: 31 Minutes      Dr Lavera Guise Internal medicine

## 2018-10-31 ENCOUNTER — Ambulatory Visit (INDEPENDENT_AMBULATORY_CARE_PROVIDER_SITE_OTHER): Payer: Medicare Other | Admitting: Internal Medicine

## 2018-10-31 DIAGNOSIS — R0602 Shortness of breath: Secondary | ICD-10-CM

## 2018-10-31 LAB — PULMONARY FUNCTION TEST

## 2018-11-03 NOTE — Procedures (Signed)
[place], [address]  DATE OF SERVICE: October 31, 2018  Complete Pulmonary Function Testing Interpretation:  FINDINGS:  The forced vital capacity is 1.56 L which is 63% of predicted and is moderately decreased.  The FEV1 is 0.73 L which is 38% of predicted and is severely decreased.  FEV1 FVC ratio is severely decreased.  Postbronchodilator there is no significant improvement in the FEV1 however clinical improvement may occur in the absence of spirometric improvement does not preclude the use of bronchodilators.  The total lung capacity is normal according to body plethysmography.  Residual volume is increased residual volume total lung capacity ratio is increased FRC is increased  IMPRESSION:  This pulmonary function study is consistent with severe obstructive lung disease there does not appear to be spirometric response to bronchodilators however clinical improvement may occur in the absence of spirometric improvement and does not preclude the use of bronchodilators.  Allyne Gee, MD [facility] Pulmonary Critical Care Medicine Sleep Medicine

## 2018-12-19 ENCOUNTER — Ambulatory Visit
Admission: RE | Admit: 2018-12-19 | Discharge: 2018-12-19 | Disposition: A | Discharge status: Home / Self Care | Payer: Medicare Other | Source: Ambulatory Visit | Attending: Nurse Practitioner | Admitting: Nurse Practitioner

## 2018-12-19 DIAGNOSIS — Z1231 Encounter for screening mammogram for malignant neoplasm of breast: Secondary | ICD-10-CM | POA: Diagnosis not present

## 2018-12-19 DIAGNOSIS — E2839 Other primary ovarian failure: Secondary | ICD-10-CM | POA: Insufficient documentation

## 2018-12-19 DIAGNOSIS — M810 Age-related osteoporosis without current pathological fracture: Secondary | ICD-10-CM | POA: Diagnosis not present

## 2018-12-27 ENCOUNTER — Telehealth: Payer: Self-pay | Admitting: Internal Medicine

## 2018-12-27 NOTE — Telephone Encounter (Signed)
Spiriva HandiHaler 18MCG capsules Approvedtoday CaseId:[id number];Status:Approved;Review Type:Prior Auth;Coverage Start Date:11/27/2018;Coverage End Date:12/27/2019;

## 2019-01-01 ENCOUNTER — Other Ambulatory Visit: Payer: Self-pay | Admitting: Internal Medicine

## 2019-01-01 DIAGNOSIS — A310 Pulmonary mycobacterial infection: Secondary | ICD-10-CM

## 2019-01-04 NOTE — Telephone Encounter (Signed)
PT CONTINUING CARE.

## 2019-01-13 ENCOUNTER — Other Ambulatory Visit: Payer: Self-pay | Admitting: Internal Medicine

## 2019-01-13 DIAGNOSIS — A310 Pulmonary mycobacterial infection: Secondary | ICD-10-CM

## 2019-01-14 ENCOUNTER — Telehealth: Payer: Self-pay

## 2019-01-14 NOTE — Telephone Encounter (Signed)
Spoke with pt son Iona Beard about zithromycin refills in our chart its showed pt is no longer on this he will call me back and also advised she need follow up if confusion

## 2019-01-14 NOTE — Telephone Encounter (Signed)
Refused to fill zithromax because pt is no longer on that med and also spoke with pt son Iona Beard she is not on that med he can discuss with dr Humphrey Rolls on next visit her mom found old med bottle so she called phar

## 2019-04-05 ENCOUNTER — Other Ambulatory Visit: Payer: Self-pay | Admitting: Internal Medicine

## 2019-04-05 DIAGNOSIS — J449 Chronic obstructive pulmonary disease, unspecified: Secondary | ICD-10-CM

## 2019-04-05 DIAGNOSIS — J9611 Chronic respiratory failure with hypoxia: Secondary | ICD-10-CM

## 2019-04-08 ENCOUNTER — Ambulatory Visit: Payer: Self-pay | Admitting: Nurse Practitioner

## 2019-04-09 NOTE — Progress Notes (Signed)
She was scheduled for CPE with me 04/08/2019 and her appointment was cancelled. Can we have her reschedule this or at least get her to have video visit to discuss her bone density report. Thanks.

## 2019-04-18 ENCOUNTER — Encounter: Payer: Self-pay | Admitting: Internal Medicine

## 2019-04-18 ENCOUNTER — Ambulatory Visit (INDEPENDENT_AMBULATORY_CARE_PROVIDER_SITE_OTHER): Payer: Medicare Other | Admitting: Internal Medicine

## 2019-04-18 ENCOUNTER — Other Ambulatory Visit: Payer: Self-pay

## 2019-04-18 VITALS — BP 122/80 | HR 93 | Resp 16 | Ht 61.0 in | Wt 133.0 lb

## 2019-04-18 DIAGNOSIS — A310 Pulmonary mycobacterial infection: Secondary | ICD-10-CM | POA: Diagnosis not present

## 2019-04-18 DIAGNOSIS — R0602 Shortness of breath: Secondary | ICD-10-CM

## 2019-04-18 DIAGNOSIS — G729 Myopathy, unspecified: Secondary | ICD-10-CM

## 2019-04-18 DIAGNOSIS — J449 Chronic obstructive pulmonary disease, unspecified: Secondary | ICD-10-CM | POA: Diagnosis not present

## 2019-04-18 NOTE — Progress Notes (Signed)
[facility] [place], [place] [address]  Pulmonary Sleep Medicine   Office Visit Note  Patient Name: Breanna Christian DOB: February 15, 1941 MRN [id number]  Date of Service: 04/18/2019  Complaints/HPI: She is doing well. Has had no flare ups recently. She has no cough no fever. She does note some sputum in the mornings. She may not be drinking enough fluids. She has no admissions to the hospital. Her spirometry is down a little today. She has not been as active also noted according to her son. She will need this to be reassessed. She is currently asymptomatic  ROS  General: (-) fever, (-) chills, (-) night sweats, (-) weakness Skin: (-) rashes, (-) itching,. Eyes: (-) visual changes, (-) redness, (-) itching. Nose and Sinuses: (-) nasal stuffiness or itchiness, (-) postnasal drip, (-) nosebleeds, (-) sinus trouble. Mouth and Throat: (-) sore throat, (-) hoarseness. Neck: (-) swollen glands, (-) enlarged thyroid, (-) neck pain. Respiratory: - cough, (-) bloody sputum, - shortness of breath, - wheezing. Cardiovascular: - ankle swelling, (-) chest pain. Lymphatic: (-) lymph node enlargement. Neurologic: (-) numbness, (-) tingling. Psychiatric: (-) anxiety, (-) depression   Current Medication: Outpatient Encounter Medications as of 04/18/2019  Medication Sig  . ARTIFICIAL TEAR OP Place 1 drop into both eyes every 6 (six) hours as needed (dry eyes). A product from [place].  Marland Kitchen aspirin EC 81 MG tablet Take 81 mg by mouth at bedtime.   . budesonide-formoterol (SYMBICORT) 80-4.5 MCG/ACT inhaler Inhale 2 puffs into the lungs 2 times daily at 12 noon and 4 pm.  . clonazePAM (KLONOPIN) 0.5 MG tablet Take 0.5 tablets (0.25 mg total) by mouth as needed for anxiety (Take one Pill before MRI).  . Cyanocobalamin (RA VITAMIN B-12 TR) 1000 MCG TBCR Take 1,000 mcg by mouth daily.   . cyclobenzaprine (FLEXERIL) 10 MG tablet Take 1 tablet (10 mg total) by mouth at bedtime.  Marland Kitchen  diltiazem (CARDIZEM CD) 240 MG 24 hr capsule TAKE 1 CAPSULE BY MOUTH EVERY DAY  . ethambutol (MYAMBUTOL) 400 MG tablet 1 TAB BY MOUTH TWICE A DAY  . folic acid (FOLVITE) 1 MG tablet Take 1 mg by mouth daily.   Marland Kitchen ibuprofen (ADVIL,MOTRIN) 200 MG tablet Take 200 mg by mouth as needed.  Marland Kitchen ipratropium-albuterol (DUONEB) 0.5-2.5 (3) MG/3ML SOLN USE EVERY 6 HOURS AS NEEDED  . levofloxacin (LEVAQUIN) 500 MG tablet Take 1 tablet (500 mg total) by mouth daily.  . methotrexate (RHEUMATREX) 2.5 MG tablet Take 15 mg by mouth once a week. Takes 6 tablets every Thursday.  . Multiple Vitamin (MULTI-VITAMINS) TABS Take 1 tablet by mouth daily. Reported on 04/25/2016  . Multiple Vitamins-Minerals (OCUVITE PO) Take 1 tablet by mouth daily.  . Omega-3 Fatty Acids (OMEGA-3 FISH OIL) 300 MG CAPS Take by mouth daily.  . predniSONE (DELTASONE) 10 MG tablet Take 1 tablet (10 mg total) by mouth daily with breakfast.  . PROAIR HFA 108 (90 Base) MCG/ACT inhaler INHALE 2 PUFFS EVERY 6 HOURS AS NEEDED  . rifampin (RIFADIN) 150 MG capsule Take 1 capsule (150 mg total) by mouth 2 (two) times daily.  Marland Kitchen SPIRIVA HANDIHALER 18 MCG inhalation capsule INHALE 1 CAPSULE VIA HANDIHALER ONCE DAILY AT THE SAME TIME EVERY DAY  . traZODone (DESYREL) 100 MG tablet Take 100 mg by mouth at bedtime as needed for sleep.  . Vitamin D, Ergocalciferol, (DRISDOL) 50000 units CAPS capsule Take 50,000 Units by mouth every 7 (seven) days. Takes on Sundays   Facility-Administered  Encounter Medications as of 04/18/2019  Medication  . ipratropium-albuterol (DUONEB) 0.5-2.5 (3) MG/3ML nebulizer solution 3 mL    Surgical History: Past Surgical History:  Procedure Laterality Date  . APPENDECTOMY    . BRONCHOSCOPY  02/2016  . CATARACT EXTRACTION W/PHACO Right 10/26/2016   Procedure: CATARACT EXTRACTION PHACO AND INTRAOCULAR LENS PLACEMENT (IOC);  Surgeon: Estill Cotta, MD;  Location: [facility] ORS;  Service: Ophthalmology;  Laterality: Right;  [place]  2.17 AP% 26.5 CDE 56.86 Fluid pack lot # [id number]  . FLEXIBLE BRONCHOSCOPY Bilateral 03/16/2016   Procedure: FLEXIBLE BRONCHOSCOPY;  Surgeon: Allyne Gee, MD;  Location: [facility] ORS;  Service: Pulmonary;  Laterality: Bilateral;  . FLEXIBLE BRONCHOSCOPY N/A 03/03/2017   Procedure: FLEXIBLE BRONCHOSCOPY;  Surgeon: Allyne Gee, MD;  Location: [facility] ORS;  Service: Pulmonary;  Laterality: N/A;  . TUBAL LIGATION      Medical History: Past Medical History:  Diagnosis Date  . Bronchiectasis ([place]) 2011  . COPD (chronic obstructive pulmonary disease) ([place])   . HOH (hard of hearing)   . Hypertension   . Lung nodules   . Non-Hodgkin lymphoma ([place]) 2003  . Pneumonia    FREQUENT IN PAST    H/O MYCOBACTERIAL  . Rheumatoid arthritis ([place])   . Sjogren's syndrome ([place])   . Xerostomia     Family History: History reviewed. No pertinent family history.  Social History: Social History   Socioeconomic History  . Marital status: Married    Spouse name: Not on file  . Number of children: Not on file  . Years of education: Not on file  . Highest education level: Not on file  Occupational History  . Not on file  Social Needs  . Financial resource strain: Not on file  . Food insecurity:    Worry: Not on file    Inability: Not on file  . Transportation needs:    Medical: Not on file    Non-medical: Not on file  Tobacco Use  . Smoking status: Former Smoker    Last attempt to quit: 09/01/1997    Years since quitting: 21.6  . Smokeless tobacco: Never Used  Substance and Sexual Activity  . Alcohol use: No  . Drug use: No  . Sexual activity: Not on file  Lifestyle  . Physical activity:    Days per week: Not on file    Minutes per session: Not on file  . Stress: Not on file  Relationships  . Social connections:    Talks on phone: Not on file    Gets together: Not on file    Attends religious service: Not on file    Active member of club or organization: Not on file    Attends meetings  of clubs or organizations: Not on file    Relationship status: Not on file  . Intimate partner violence:    Fear of current or ex partner: Not on file    Emotionally abused: Not on file    Physically abused: Not on file    Forced sexual activity: Not on file  Other Topics Concern  . Not on file  Social History Narrative  . Not on file    Vital Signs: Blood pressure 122/80, pulse 93, resp. rate 16, height _0  (1.549 m), weight 133 lb (60.3 kg), SpO2 96 %.  Examination: General Appearance: The patient is well-developed, well-nourished, and in no distress. Skin: Gross inspection of skin unremarkable. Head: normocephalic, no gross deformities. Eyes: no gross deformities noted. ENT: ears appear  grossly normal no exudates. Neck: Supple. No thyromegaly. No LAD. Respiratory: no rhonchi noted at this time. Cardiovascular: Normal S1 and S2 without murmur or rub. Extremities: No cyanosis. pulses are equal. Neurologic: Alert and oriented. No involuntary movements.  LABS: No results found for this or any previous visit (from the past 2160 hour(s)).  Radiology: Dg Bone Density  Result Date: 12/19/2018 EXAM: DUAL X-RAY ABSORPTIOMETRY (DXA) FOR BONE MINERAL DENSITY IMPRESSION: Technologist: SCE PATIENT BIOGRAPHICAL: Name: Ansleigh, Safer Patient ID: [id number] Birth Date: 06-13-1941 Height: 61.5 in. Gender: Female Exam Date: 12/19/2018 Weight: 131.5 lbs. Indications: Advanced Age, Caucasian, COPD, Height Loss, History of Osteoporosis, Parent Hip Fracture, Postmenopausal, Rheumatoid Arthritis Fractures: Treatments: Multi-Vitamin, ProAir inhaler, Spiriva, Vitamin D ASSESSMENT: The BMD measured at AP Spine L1-L4 is 0.781 g/cm2 with a T-score of -3.3. This patient is considered osteoporotic according to [place]) criteria. The quality of the scan is good. Site Region Measured Measured WHO Young Adult BMD Date       Age      Classification T-score AP Spine L1-L4 12/19/2018  76.6 Osteoporosis -3.3 0.781 g/cm2 DualFemur Total Right 12/19/2018 76.6 Osteoporosis -2.6 0.676 g/cm2 World Health Organization [facility]) criteria for post-menopausal, Caucasian Women: Normal:       T-score at or above -1 SD Osteopenia:   T-score between -1 and -2.5 SD Osteoporosis: T-score at or below -2.5 SD RECOMMENDATIONS: 1. All patients should optimize calcium and vitamin D intake. 2. Consider FDA-approved medical therapies in postmenopausal women and men aged 76 years and older, based on the following: a. A hip or vertebral(clinical or morphometric) fracture b. T-score < -2.5 at the femoral neck or spine after appropriate evaluation to exclude secondary causes c. Low bone mass (T-score between -1.0 and -2.5 at the femoral neck or spine) and a 10-year probability of a hip fracture > 3% or a 10-year probability of a major osteoporosis-related fracture > 20% based on the [place]-adapted WHO algorithm d. Clinician judgment and/or patient preferences may indicate treatment for people with 10-year fracture probabilities above or below these levels FOLLOW-UP: People with diagnosed cases of osteoporosis or at high risk for fracture should have regular bone mineral density tests. For patients eligible for Medicare, routine testing is allowed once every 2 years. The testing frequency can be increased to one year for patients who have rapidly progressing disease, those who are receiving or discontinuing medical therapy to restore bone mass, or have additional risk factors. Electronically Signed   By: Earle Gell M.D.   On: 12/19/2018 10:56   Mm 3d Screen Breast Bilateral  Result Date: 12/25/2018 CLINICAL DATA:  Screening. EXAM: DIGITAL SCREENING BILATERAL MAMMOGRAM WITH TOMO AND CAD COMPARISON:  Previous exam(s). ACR Breast Density Category b: There are scattered areas of fibroglandular density. FINDINGS: There are no findings suspicious for malignancy. Images were processed with CAD. IMPRESSION: No mammographic evidence of  malignancy. A result letter of this screening mammogram will be mailed directly to the patient. RECOMMENDATION: Screening mammogram in one year. (Code:SM-B-01Y) BI-RADS CATEGORY  1: Negative. Electronically Signed   By: Dorise Bullion III M.D   On: 12/25/2018 12:13    No results found.  No results found.    Assessment and Plan: Patient Active Problem List   Diagnosis Date Noted  . Age related osteoporosis 10/15/2018  . Ovarian failure 10/15/2018  . Screening for breast cancer 04/29/2018  . Essential hypertension 04/29/2018  . Obstructive chronic bronchitis without exacerbation ([place]) 04/29/2018  . MAI (mycobacterium avium-intracellulare) ([place]) 02/08/2018  .  Numbness and tingling 03/25/2015  . Myopathy 02/19/2015  . Polyneuropathy 02/18/2015  . Weakness of both lower extremities 02/18/2015    1. COPD doing better she has been off her oxygen at this time. 2. MAI treated doing much better since her therapy She is on Ethambutol and Rifampin which will be continued as she does not want to come off the drugs since she had a relapse when she was last taken off the drugs. Side effects were discussed with her at length 3. Oxygen dependent would ressomend using the oxygen as prescribed 4. Myopathy improved  General Counseling: I have discussed the findings of the evaluation and examination with [place].  I have also discussed any further diagnostic evaluation thatmay be needed or ordered today. Crystina verbalizes understanding of the findings of todays visit. We also reviewed her medications today and discussed drug interactions and side effects including but not limited excessive drowsiness and altered mental states. We also discussed that there is always a risk not just to her but also people around her. she has been encouraged to call the office with any questions or concerns that should arise related to todays visit.    Time spent: 51mn  I have personally obtained a history,  examined the patient, evaluated laboratory and imaging results, formulated the assessment and plan and placed orders.    SAllyne Gee MD FMonterey Peninsula Surgery Center Munras AvePulmonary and Critical Care Sleep medicine

## 2019-04-18 NOTE — Patient Instructions (Signed)
Mycobacterium Avium Complex  Mycobacterium avium complex (MAC) is an infection caused by two similar and very common types of bacteria. MAC causes two types of infection:  · Local infection. This is limited to one area. If you have a normal disease-fighting system (immune system), you are more likely to get this type of infection.  · Disseminated infection. This is an infection that affects all parts of the body. It is more serious than a local infection. You are more likely to get this infection if you have a weak immune system.  MAC infections most often affect the lungs. MAC is not contagious. This means that it does not spread from person to person.  What are the causes?  This condition is caused by two kinds of bacteria, Mycobacterium avium and Mycobacterium intracellulare. These bacteria are often found in drinking water, hot tubs, swimming pools, house dust, and animals. You may become infected from:  · Breathing in water particles or dust particles that contain the bacteria (contaminated).  · Eating or drinking something that is contaminated.  · Drinking milk. MAC bacteria can grow in pasteurized milk.  What increases the risk?  The following factors may make you more likely to develop this condition:  · Having HIV or AIDS.  · Being a child.  · Being an older woman.  · Smoking cigarettes.  · Having long-term (chronic) lung diseases, such as:  ? Tuberculosis.  ? Chronic obstructive pulmonary disease (COPD).  ? Lung cancer.  ? Cystic fibrosis.  What are the signs or symptoms?  Symptoms vary depending on the type of MAC infection you have.  Symptoms of a local infection  · Lymph nodes that are bigger than usual (enlarged). Enlarged lymph nodes may be:  ? On one side of the neck.  ? Under the jaw.  ? Around the ear.  · Fatigue.  · Shortness of breath.  · A long-term cough that produces lots of mucus.  · Abnormal sounds when breathing. A health care provider may hear this when listening to your lungs.  Symptoms  of a disseminated infection  · Fever.  · Night sweats.  · Weight loss.  · Loss of appetite.  · Fatigue.  How is this diagnosed?  This condition may be diagnosed based on:  · Your symptoms.  · A physical exam.  · Your medical history.  You may also have tests, including:  · Chest X-ray or CT scan.  · Blood tests.  · Test of mucus from your lungs (sputum) or other body fluids to see whether bacteria will grow (culture).  · Removal of a piece of body tissue to be checked under a microscope (biopsy).  How is this treated?  Treatment for this condition depends on the type of infection. Treatment may include:  · Taking antibiotic medicines. You may have to take antibiotics until MAC bacteria have stopped growing in cultures.  · Surgery to remove infected lymph nodes. After the lymph nodes are removed, antibiotics are usually not needed.  In some people with a disseminated infection, treatment with antibiotic medicines may continue for several years.  Follow these instructions at home:  Medicines    · Take over-the-counter and prescription medicines only as told by your health care provider.  · Take your antibiotic medicine as told by your health care provider. Do not stop taking the antibiotic even if you start to feel better.  Eating and drinking    · Eat a healthy, well-balanced diet. Talk with your   health care provider or a diet and nutrition specialist (dietitian) about what food choices are best for you.  · Drink enough fluid to keep your urine pale yellow.  General instructions  · Seek medical care for any underlying health conditions you have. Follow your health care provider's instructions about how to manage those conditions.  · Do not use any products that contain nicotine or tobacco, such as cigarettes and e-cigarettes. If you need help quitting, ask your health care provider.  · Keep all follow-up visits as told by your health care provider. This is important.  How is this prevented?    · Stay up-to-date on  your vaccines.  · Take all of your medicines as told by your health care provider, especially if you have problems with your immune system.  · Avoid drinking water that may not be clean. Lakes, rivers, and other open water sources can contain germs that cause infections.  · Wash your hands often with soap and water. If soap and water are not available, use hand sanitizer.  · Practice safe food preparation, especially if your immune system is weak because of an underlying illness.  ? Store foods at safe temperatures.  ? Avoid eating raw or undercooked meats, poultry, fish, shellfish, and eggs.  ? Wash and peel fruits and vegetables before eating or cooking them.  ? Use separate food preparation surfaces and storage spaces for raw meat and for fruits and vegetables.  ? Wash your hands, food preparation surfaces, and utensils thoroughly before and after you handle raw foods.  Contact a health care provider if you:  · Have chills or a fever.  · Have a cough that does not go away.  · Have loss of appetite or weight loss.  · Feel very tired.  Get help right away if you:  · Have a fever for more than 2-3 days.  · Cough up blood.  · Have trouble breathing.  · Have chest pain.  Summary  · Mycobacterium avium complex (MAC) is an infection that is caused by two similar and very common types of bacteria.  · If you have a normal disease-fighting system (immune system), you are more likely to get an infection that is limited to one area of your body (local infection).  · If you have a weak immune system, you are likely to get infections in all parts of your body (disseminated infection).  · Treatment for this condition is difficult because MAC does not respond to many common antibiotic medicines. Treatment depends on the type of infection, and may involve surgery or antibiotics.  This information is not intended to replace advice given to you by your health care provider. Make sure you discuss any questions you have with your  health care provider.  Document Released: 11/12/2013 Document Revised: 12/15/2017 Document Reviewed: 12/15/2017  Elsevier Interactive Patient Education © 2019 [employer]

## 2019-04-23 DIAGNOSIS — A310 Pulmonary mycobacterial infection: Secondary | ICD-10-CM | POA: Diagnosis not present

## 2019-04-24 LAB — CMP14+EGFR
ALT: 16 IU/L (ref 0–32)
AST: 25 IU/L (ref 0–40)
Albumin/Globulin Ratio: 1.8 (ref 1.2–2.2)
Albumin: 4.4 g/dL (ref 3.7–4.7)
Alkaline Phosphatase: 88 IU/L (ref 39–117)
BUN/Creatinine Ratio: 20 (ref 12–28)
BUN: 13 mg/dL (ref 8–27)
Bilirubin Total: 0.5 mg/dL (ref 0.0–1.2)
CO2: 27 mmol/L (ref 20–29)
Calcium: 9.7 mg/dL (ref 8.7–10.3)
Chloride: 101 mmol/L (ref 96–106)
Creatinine, Ser: 0.66 mg/dL (ref 0.57–1.00)
GFR calc Af Amer: 99 mL/min/{1.73_m2} (ref >59)
GFR calc non Af Amer: 86 mL/min/{1.73_m2} (ref >59)
Globulin, Total: 2.5 g/dL (ref 1.5–4.5)
Glucose: 100 mg/dL — ABNORMAL HIGH (ref 65–99)
Potassium: 5.8 mmol/L — ABNORMAL HIGH (ref 3.5–5.2)
Sodium: 141 mmol/L (ref 134–144)
Total Protein: 6.9 g/dL (ref 6.0–8.5)

## 2019-05-02 ENCOUNTER — Ambulatory Visit: Payer: Self-pay | Admitting: Nurse Practitioner

## 2019-06-11 ENCOUNTER — Telehealth: Payer: Self-pay

## 2019-06-11 NOTE — Telephone Encounter (Signed)
Left a message asking for a callback to schd appt to review bmd and labs she needs a pcp follow up. Breanna Christian

## 2019-07-15 ENCOUNTER — Ambulatory Visit: Payer: Medicare (Managed Care) | Admitting: Nurse Practitioner

## 2019-07-23 ENCOUNTER — Encounter: Payer: Self-pay | Admitting: Adult Health

## 2019-07-23 ENCOUNTER — Ambulatory Visit (INDEPENDENT_AMBULATORY_CARE_PROVIDER_SITE_OTHER): Payer: Medicare Other | Admitting: Adult Health

## 2019-07-23 VITALS — BP 125/73 | HR 87 | Resp 16 | Ht 61.0 in | Wt 132.0 lb

## 2019-07-23 DIAGNOSIS — I10 Essential (primary) hypertension: Secondary | ICD-10-CM | POA: Diagnosis not present

## 2019-07-23 DIAGNOSIS — M810 Age-related osteoporosis without current pathological fracture: Secondary | ICD-10-CM

## 2019-07-23 DIAGNOSIS — J4489 Other specified chronic obstructive pulmonary disease: Secondary | ICD-10-CM

## 2019-07-23 DIAGNOSIS — R300 Dysuria: Secondary | ICD-10-CM | POA: Diagnosis not present

## 2019-07-23 DIAGNOSIS — J449 Chronic obstructive pulmonary disease, unspecified: Secondary | ICD-10-CM | POA: Diagnosis not present

## 2019-07-23 DIAGNOSIS — A310 Pulmonary mycobacterial infection: Secondary | ICD-10-CM | POA: Diagnosis not present

## 2019-07-23 DIAGNOSIS — R5383 Other fatigue: Secondary | ICD-10-CM

## 2019-07-23 DIAGNOSIS — Z0001 Encounter for general adult medical examination with abnormal findings: Secondary | ICD-10-CM | POA: Diagnosis not present

## 2019-07-23 NOTE — Progress Notes (Signed)
[facility] [place], [place] [address]  Internal MEDICINE  Office Visit Note  Patient Name: Breanna Christian  [id number]  [id number]  Date of Service: 07/23/2019  Chief Complaint  Patient presents with  . Annual Exam    discuss bmd, medciare annual wellness   . Hypertension     HPI Pt is here for routine health maintenance examination.  Pt is a well appearing 78 yo woman.  She has a history of osteoporosis, HTN, copd and MAI.  Overall she reports she is doing well.  Her son is with her in the exam room. They have questions about her bone density test, and the need for osteoporosis medications.  She denies any tobacco, alcohol or illicit drug use.   Current Medication: Outpatient Encounter Medications as of 07/23/2019  Medication Sig  . ARTIFICIAL TEAR OP Place 1 drop into both eyes every 6 (six) hours as needed (dry eyes). A product from [place].  . diltiazem (CARDIZEM CD) 240 MG 24 hr capsule TAKE 1 CAPSULE BY MOUTH EVERY DAY  . ethambutol (MYAMBUTOL) 400 MG tablet 1 TAB BY MOUTH TWICE A DAY  . ibuprofen (ADVIL,MOTRIN) 200 MG tablet Take 200 mg by mouth as needed.  . Multiple Vitamin (MULTI-VITAMINS) TABS Take 1 tablet by mouth daily. Reported on 04/25/2016  . Omega-3 Fatty Acids (OMEGA-3 FISH OIL) 300 MG CAPS Take by mouth daily.  . rifampin (RIFADIN) 150 MG capsule Take 1 capsule (150 mg total) by mouth 2 (two) times daily.  Marland Kitchen SPIRIVA HANDIHALER 18 MCG inhalation capsule INHALE 1 CAPSULE VIA HANDIHALER ONCE DAILY AT THE SAME TIME EVERY DAY  . Vitamin D, Ergocalciferol, (DRISDOL) 50000 units CAPS capsule Take 50,000 Units by mouth every 7 (seven) days. Takes on Sundays  . [DISCONTINUED] aspirin EC 81 MG tablet Take 81 mg by mouth at bedtime.   . [DISCONTINUED] budesonide-formoterol (SYMBICORT) 80-4.5 MCG/ACT inhaler Inhale 2 puffs into the lungs 2 times daily at 12 noon and 4 pm. (Patient not taking: Reported on 07/23/2019)  . [DISCONTINUED] clonazePAM  (KLONOPIN) 0.5 MG tablet Take 0.5 tablets (0.25 mg total) by mouth as needed for anxiety (Take one Pill before MRI). (Patient not taking: Reported on 07/23/2019)  . [DISCONTINUED] Cyanocobalamin (RA VITAMIN B-12 TR) 1000 MCG TBCR Take 1,000 mcg by mouth daily.   . [DISCONTINUED] cyclobenzaprine (FLEXERIL) 10 MG tablet Take 1 tablet (10 mg total) by mouth at bedtime. (Patient not taking: Reported on 07/23/2019)  . [DISCONTINUED] folic acid (FOLVITE) 1 MG tablet Take 1 mg by mouth daily.   . [DISCONTINUED] ipratropium-albuterol (DUONEB) 0.5-2.5 (3) MG/3ML SOLN USE EVERY 6 HOURS AS NEEDED (Patient not taking: Reported on 07/23/2019)  . [DISCONTINUED] levofloxacin (LEVAQUIN) 500 MG tablet Take 1 tablet (500 mg total) by mouth daily. (Patient not taking: Reported on 07/23/2019)  . [DISCONTINUED] methotrexate (RHEUMATREX) 2.5 MG tablet Take 15 mg by mouth once a week. Takes 6 tablets every Thursday.  . [DISCONTINUED] Multiple Vitamins-Minerals (OCUVITE PO) Take 1 tablet by mouth daily.  . [DISCONTINUED] predniSONE (DELTASONE) 10 MG tablet Take 1 tablet (10 mg total) by mouth daily with breakfast. (Patient not taking: Reported on 07/23/2019)  . [DISCONTINUED] PROAIR HFA 108 (90 Base) MCG/ACT inhaler INHALE 2 PUFFS EVERY 6 HOURS AS NEEDED (Patient not taking: Reported on 07/23/2019)  . [DISCONTINUED] traZODone (DESYREL) 100 MG tablet Take 100 mg by mouth at bedtime as needed for sleep.   Facility-Administered Encounter Medications as of 07/23/2019  Medication  . ipratropium-albuterol (DUONEB) 0.5-2.5 (3)  MG/3ML nebulizer solution 3 mL    Surgical History: Past Surgical History:  Procedure Laterality Date  . APPENDECTOMY    . BRONCHOSCOPY  02/2016  . CATARACT EXTRACTION W/PHACO Right 10/26/2016   Procedure: CATARACT EXTRACTION PHACO AND INTRAOCULAR LENS PLACEMENT (IOC);  Surgeon: Estill Cotta, MD;  Location: [facility] ORS;  Service: Ophthalmology;  Laterality: Right;  [place] 2.17 AP% 26.5 CDE 56.86 Fluid pack lot #  [id number]  . FLEXIBLE BRONCHOSCOPY Bilateral 03/16/2016   Procedure: FLEXIBLE BRONCHOSCOPY;  Surgeon: Allyne Gee, MD;  Location: [facility] ORS;  Service: Pulmonary;  Laterality: Bilateral;  . FLEXIBLE BRONCHOSCOPY N/A 03/03/2017   Procedure: FLEXIBLE BRONCHOSCOPY;  Surgeon: Allyne Gee, MD;  Location: [facility] ORS;  Service: Pulmonary;  Laterality: N/A;  . TUBAL LIGATION      Medical History: Past Medical History:  Diagnosis Date  . Bronchiectasis ([place]) 2011  . COPD (chronic obstructive pulmonary disease) ([place])   . HOH (hard of hearing)   . Hypertension   . Lung nodules   . Non-Hodgkin lymphoma ([place]) 2003  . Pneumonia    FREQUENT IN PAST    H/O MYCOBACTERIAL  . Rheumatoid arthritis ([place])   . Sjogren's syndrome ([place])   . Xerostomia     Family History: History reviewed. No pertinent family history.    Review of Systems  Constitutional: Negative for chills, fatigue and unexpected weight change.  HENT: Negative for congestion, rhinorrhea, sneezing and sore throat.   Eyes: Negative for photophobia, pain and redness.  Respiratory: Negative for cough, chest tightness and shortness of breath.   Cardiovascular: Negative for chest pain and palpitations.  Gastrointestinal: Negative for abdominal pain, constipation, diarrhea, nausea and vomiting.  Endocrine: Negative.   Genitourinary: Negative for dysuria and frequency.  Musculoskeletal: Negative for arthralgias, back pain, joint swelling and neck pain.  Skin: Negative for rash.  Allergic/Immunologic: Negative.   Neurological: Negative for tremors and numbness.  Hematological: Negative for adenopathy. Does not bruise/bleed easily.  Psychiatric/Behavioral: Negative for behavioral problems and sleep disturbance. The patient is not nervous/anxious.      Vital Signs: BP 125/73   Pulse 87   Resp 16   Ht _0  (1.549 m)   Wt 132 lb (59.9 kg)   SpO2 95%   BMI 24.94 kg/m    Physical Exam Vitals signs and nursing note reviewed.   Constitutional:      General: She is not in acute distress.    Appearance: She is well-developed. She is not diaphoretic.  HENT:     Head: Normocephalic and atraumatic.     Mouth/Throat:     Pharynx: No oropharyngeal exudate.  Eyes:     Pupils: Pupils are equal, round, and reactive to light.  Neck:     Musculoskeletal: Normal range of motion and neck supple.     Thyroid: No thyromegaly.     Vascular: No JVD.     Trachea: No tracheal deviation.  Cardiovascular:     Rate and Rhythm: Normal rate and regular rhythm.     Heart sounds: Normal heart sounds. No murmur. No friction rub. No gallop.   Pulmonary:     Effort: Pulmonary effort is normal. No respiratory distress.     Breath sounds: Normal breath sounds. No wheezing or rales.  Chest:     Chest wall: No tenderness.  Abdominal:     Palpations: Abdomen is soft.     Tenderness: There is no abdominal tenderness. There is no guarding.  Musculoskeletal: Normal range of motion.  Lymphadenopathy:  Cervical: No cervical adenopathy.  Skin:    General: Skin is warm and dry.  Neurological:     Mental Status: She is alert and oriented to person, place, and time.     Cranial Nerves: No cranial nerve deficit.  Psychiatric:        Behavior: Behavior normal.        Thought Content: Thought content normal.        Judgment: Judgment normal.       The BMD measured at AP Spine L1-L4 is 0.781 g/cm2 with a T-score of -3.3. This patient is considered osteoporotic according to [facility]) criteria. The quality of the scan is good.  Site Region Measured Measured WHO Young Adult BMD Date       Age      Classification T-score AP Spine L1-L4 12/19/2018 [age] Osteoporosis -3.3 0.781 g/cm2 Assessment/Plan: 1. Encounter for general adult medical examination with abnormal findings Up to date on [facility] Lab slip given to patient for this years labs.   2. Age related osteoporosis, unspecified pathological fracture presence Pt  wants to discuss options for osteoporosis.  I recommended boniva to them, and they are deciding.   3. Obstructive chronic bronchitis without exacerbation (HCC) Stable, good control.  Continue to use inhalers as directed.   4. MAI (mycobacterium avium-intracellulare) ([place]) Continue to monitor.  Well controlled at this time.   5. Essential hypertension Well controlled, continue present medications.   6. Dysuria - UA/M w/rflx Culture, Routine  7. Other fatigue Will get labs and discuss at next visit.  Otherwise encouraged patient to rest and if symptoms do not improve, return to office   General Counseling: Zyara verbalizes understanding of the findings of todays visit and agrees with plan of treatment. I have discussed any further diagnostic evaluation that may be needed or ordered today. We also reviewed her medications today. she has been encouraged to call the office with any questions or concerns that should arise related to todays visit.   Orders Placed This Encounter  Procedures  . UA/M w/rflx Culture, Comp    No orders of the defined types were placed in this encounter.   Time spent: 20 Minutes   This patient was seen by Orson Gear AGNP-C in Collaboration with Dr Lavera Guise as a part of collaborative care agreement    Kendell Bane AGNP-C Internal Medicine

## 2019-07-24 LAB — MICROSCOPIC EXAMINATION
Casts: NONE SEEN /lpf
RBC, Urine: NONE SEEN /hpf (ref 0–2)
WBC, UA: NONE SEEN /hpf (ref 0–5)

## 2019-07-24 LAB — UA/M W/RFLX CULTURE, ROUTINE
Bilirubin, UA: NEGATIVE
Glucose, UA: NEGATIVE
Ketones, UA: NEGATIVE
Leukocytes,UA: NEGATIVE
Nitrite, UA: NEGATIVE
Protein,UA: NEGATIVE
RBC, UA: NEGATIVE
Specific Gravity, UA: 1.021 (ref 1.005–1.030)
Urobilinogen, Ur: 0.2 mg/dL (ref 0.2–1.0)
pH, UA: 6.5 (ref 5.0–7.5)

## 2019-07-30 ENCOUNTER — Other Ambulatory Visit: Payer: Self-pay | Admitting: Adult Health

## 2019-07-30 DIAGNOSIS — E756 Lipid storage disorder, unspecified: Secondary | ICD-10-CM | POA: Diagnosis not present

## 2019-07-30 DIAGNOSIS — R5381 Other malaise: Secondary | ICD-10-CM | POA: Diagnosis not present

## 2019-07-30 DIAGNOSIS — E0781 Sick-euthyroid syndrome: Secondary | ICD-10-CM | POA: Diagnosis not present

## 2019-07-30 DIAGNOSIS — E611 Iron deficiency: Secondary | ICD-10-CM | POA: Diagnosis not present

## 2019-07-30 DIAGNOSIS — Z0001 Encounter for general adult medical examination with abnormal findings: Secondary | ICD-10-CM | POA: Diagnosis not present

## 2019-07-30 DIAGNOSIS — R790 Abnormal level of blood mineral: Secondary | ICD-10-CM | POA: Diagnosis not present

## 2019-07-30 DIAGNOSIS — E538 Deficiency of other specified B group vitamins: Secondary | ICD-10-CM | POA: Diagnosis not present

## 2019-07-30 DIAGNOSIS — E079 Disorder of thyroid, unspecified: Secondary | ICD-10-CM | POA: Diagnosis not present

## 2019-07-31 LAB — FERRITIN: Ferritin: 36 ng/mL (ref 15–150)

## 2019-07-31 LAB — CBC WITH DIFFERENTIAL/PLATELET
Basophils Absolute: 0.1 10*3/uL (ref 0.0–0.2)
Basos: 1 %
EOS (ABSOLUTE): 0.1 10*3/uL (ref 0.0–0.4)
Eos: 3 %
Hematocrit: 41.1 % (ref 34.0–46.6)
Hemoglobin: 13.6 g/dL (ref 11.1–15.9)
Immature Grans (Abs): 0 10*3/uL (ref 0.0–0.1)
Immature Granulocytes: 0 %
Lymphocytes Absolute: 1 10*3/uL (ref 0.7–3.1)
Lymphs: 21 %
MCH: 28 pg (ref 26.6–33.0)
MCHC: 33.1 g/dL (ref 31.5–35.7)
MCV: 85 fL (ref 79–97)
Monocytes Absolute: 0.5 10*3/uL (ref 0.1–0.9)
Monocytes: 11 %
Neutrophils Absolute: 2.9 10*3/uL (ref 1.4–7.0)
Neutrophils: 64 %
Platelets: 276 10*3/uL (ref 150–450)
RBC: 4.86 x10E6/uL (ref 3.77–5.28)
RDW: 12.7 % (ref 11.7–15.4)
WBC: 4.5 10*3/uL (ref 3.4–10.8)

## 2019-07-31 LAB — COMPREHENSIVE METABOLIC PANEL
ALT: 21 IU/L (ref 0–32)
AST: 27 IU/L (ref 0–40)
Albumin/Globulin Ratio: 1.6 (ref 1.2–2.2)
Albumin: 4.2 g/dL (ref 3.7–4.7)
Alkaline Phosphatase: 115 IU/L (ref 39–117)
BUN/Creatinine Ratio: 18 (ref 12–28)
BUN: 13 mg/dL (ref 8–27)
Bilirubin Total: 0.4 mg/dL (ref 0.0–1.2)
CO2: 24 mmol/L (ref 20–29)
Calcium: 9.3 mg/dL (ref 8.7–10.3)
Chloride: 100 mmol/L (ref 96–106)
Creatinine, Ser: 0.71 mg/dL (ref 0.57–1.00)
GFR calc Af Amer: 95 mL/min/{1.73_m2} (ref >59)
GFR calc non Af Amer: 82 mL/min/{1.73_m2} (ref >59)
Globulin, Total: 2.6 g/dL (ref 1.5–4.5)
Glucose: 95 mg/dL (ref 65–99)
Potassium: 4.6 mmol/L (ref 3.5–5.2)
Sodium: 140 mmol/L (ref 134–144)
Total Protein: 6.8 g/dL (ref 6.0–8.5)

## 2019-07-31 LAB — IRON AND TIBC
Iron Saturation: 29 % (ref 15–55)
Iron: 85 ug/dL (ref 27–139)
Total Iron Binding Capacity: 298 ug/dL (ref 250–450)
UIBC: 213 ug/dL (ref 118–369)

## 2019-07-31 LAB — B12 AND FOLATE PANEL
Folate: 16.8 ng/mL (ref >3.0)
Vitamin B-12: 531 pg/mL (ref 232–1245)

## 2019-07-31 LAB — LIPID PANEL WITH LDL/HDL RATIO
Cholesterol, Total: 157 mg/dL (ref 100–199)
HDL: 54 mg/dL (ref >39)
LDL Chol Calc (NIH): 84 mg/dL (ref 0–99)
LDL/HDL Ratio: 1.6 ratio (ref 0.0–3.2)
Triglycerides: 106 mg/dL (ref 0–149)
VLDL Cholesterol Cal: 19 mg/dL (ref 5–40)

## 2019-07-31 LAB — T4, FREE: Free T4: 1.22 ng/dL (ref 0.82–1.77)

## 2019-07-31 LAB — VITAMIN D 25 HYDROXY (VIT D DEFICIENCY, FRACTURES): Vit D, 25-Hydroxy: 39.4 ng/mL (ref 30.0–100.0)

## 2019-07-31 LAB — TSH: TSH: 1.06 u[IU]/mL (ref 0.450–4.500)

## 2019-08-20 ENCOUNTER — Other Ambulatory Visit: Payer: Self-pay

## 2019-08-20 ENCOUNTER — Ambulatory Visit (INDEPENDENT_AMBULATORY_CARE_PROVIDER_SITE_OTHER): Payer: Medicare Other | Admitting: Internal Medicine

## 2019-08-20 ENCOUNTER — Encounter: Payer: Self-pay | Admitting: Internal Medicine

## 2019-08-20 DIAGNOSIS — A310 Pulmonary mycobacterial infection: Secondary | ICD-10-CM | POA: Diagnosis not present

## 2019-08-20 DIAGNOSIS — M810 Age-related osteoporosis without current pathological fracture: Secondary | ICD-10-CM

## 2019-08-20 DIAGNOSIS — J449 Chronic obstructive pulmonary disease, unspecified: Secondary | ICD-10-CM | POA: Diagnosis not present

## 2019-08-20 DIAGNOSIS — Z23 Encounter for immunization: Secondary | ICD-10-CM

## 2019-08-20 DIAGNOSIS — I10 Essential (primary) hypertension: Secondary | ICD-10-CM

## 2019-08-20 MED ORDER — IBANDRONATE SODIUM 150 MG PO TABS: 150.0000 mg | ORAL_TABLET | ORAL | 3 refills | Status: DC

## 2019-08-20 NOTE — Progress Notes (Signed)
[facility] [place], [place] [address]  Internal MEDICINE  Office Visit Note  Patient Name: Breanna Christian  [id number]  [id number]  Date of Service: 08/20/2019  Chief Complaint  Patient presents with  . Follow-up    labs and other tests  . Hypertension  . COPD    HPI  Pt is here with her son for interpretation of greek language. She has stopped taking her Cardizem because of low bp and she felt weak. Pt has dx of MAI and has not been take her ethambutol and rifampin for few months. She will need follow up CT scan to see resolution of diease, has been feeling better. BMD shows severe osteoporosis as well. No C/P or SOB   Current Medication: Outpatient Encounter Medications as of 08/20/2019  Medication Sig  . ARTIFICIAL TEAR OP Place 1 drop into both eyes every 6 (six) hours as needed (dry eyes). A product from [place].  Marland Kitchen ethambutol (MYAMBUTOL) 400 MG tablet 1 TAB BY MOUTH TWICE A DAY  . ibuprofen (ADVIL,MOTRIN) 200 MG tablet Take 200 mg by mouth as needed.  . Multiple Vitamin (MULTI-VITAMINS) TABS Take 1 tablet by mouth daily. Reported on 04/25/2016  . Omega-3 Fatty Acids (OMEGA-3 FISH OIL) 300 MG CAPS Take by mouth daily.  . rifampin (RIFADIN) 150 MG capsule Take 1 capsule (150 mg total) by mouth 2 (two) times daily.  Marland Kitchen SPIRIVA HANDIHALER 18 MCG inhalation capsule INHALE 1 CAPSULE VIA HANDIHALER ONCE DAILY AT THE SAME TIME EVERY DAY  . Vitamin D, Ergocalciferol, (DRISDOL) 50000 units CAPS capsule Take 50,000 Units by mouth every 7 (seven) days. Takes on Sundays  . ibandronate (BONIVA) 150 MG tablet Take 1 tablet (150 mg total) by mouth every 30 (thirty) days. Take in the morning with a full glass of water, on an empty stomach, and do not take anything else by mouth or lie down for the next 30 min.  . [DISCONTINUED] diltiazem (CARDIZEM CD) 240 MG 24 hr capsule TAKE 1 CAPSULE BY MOUTH EVERY DAY (Patient not taking: Reported on 08/20/2019)    Facility-Administered Encounter Medications as of 08/20/2019  Medication  . ipratropium-albuterol (DUONEB) 0.5-2.5 (3) MG/3ML nebulizer solution 3 mL    Surgical History: Past Surgical History:  Procedure Laterality Date  . APPENDECTOMY    . BRONCHOSCOPY  02/2016  . CATARACT EXTRACTION W/PHACO Right 10/26/2016   Procedure: CATARACT EXTRACTION PHACO AND INTRAOCULAR LENS PLACEMENT (IOC);  Surgeon: Estill Cotta, MD;  Location: [facility] ORS;  Service: Ophthalmology;  Laterality: Right;  [place] 2.17 AP% 26.5 CDE 56.86 Fluid pack lot # [id number]  . FLEXIBLE BRONCHOSCOPY Bilateral 03/16/2016   Procedure: FLEXIBLE BRONCHOSCOPY;  Surgeon: Allyne Gee, MD;  Location: [facility] ORS;  Service: Pulmonary;  Laterality: Bilateral;  . FLEXIBLE BRONCHOSCOPY N/A 03/03/2017   Procedure: FLEXIBLE BRONCHOSCOPY;  Surgeon: Allyne Gee, MD;  Location: [facility] ORS;  Service: Pulmonary;  Laterality: N/A;  . TUBAL LIGATION      Medical History: Past Medical History:  Diagnosis Date  . Bronchiectasis ([place]) 2011  . COPD (chronic obstructive pulmonary disease) ([place])   . HOH (hard of hearing)   . Hypertension   . Lung nodules   . Non-Hodgkin lymphoma ([place]) 2003  . Pneumonia    FREQUENT IN PAST    H/O MYCOBACTERIAL  . Rheumatoid arthritis ([place])   . Sjogren's syndrome ([place])   . Xerostomia     Family History: History reviewed. No pertinent family history.  Social History   Socioeconomic  History  . Marital status: Married    Spouse name: Not on file  . Number of children: Not on file  . Years of education: Not on file  . Highest education level: Not on file  Occupational History  . Not on file  Social Needs  . Financial resource strain: Not on file  . Food insecurity    Worry: Not on file    Inability: Not on file  . Transportation needs    Medical: Not on file    Non-medical: Not on file  Tobacco Use  . Smoking status: Never Smoker  . Smokeless tobacco: Never Used  Substance and Sexual Activity   . Alcohol use: No  . Drug use: No  . Sexual activity: Not on file  Lifestyle  . Physical activity    Days per week: Not on file    Minutes per session: Not on file  . Stress: Not on file  Relationships  . Social Herbalist on phone: Not on file    Gets together: Not on file    Attends religious service: Not on file    Active member of club or organization: Not on file    Attends meetings of clubs or organizations: Not on file    Relationship status: Not on file  . Intimate partner violence    Fear of current or ex partner: Not on file    Emotionally abused: Not on file    Physically abused: Not on file    Forced sexual activity: Not on file  Other Topics Concern  . Not on file  Social History Narrative  . Not on file   Review of Systems  Constitutional: Negative for chills, diaphoresis and fatigue.  HENT: Negative for ear pain, postnasal drip and sinus pressure.   Eyes: Negative for photophobia, discharge, redness, itching and visual disturbance.  Respiratory: Negative for cough, shortness of breath and wheezing.   Cardiovascular: Negative for chest pain, palpitations and leg swelling.  Gastrointestinal: Negative for abdominal pain, constipation, diarrhea, nausea and vomiting.  Genitourinary: Negative for dysuria and flank pain.  Musculoskeletal: Negative for arthralgias, back pain, gait problem and neck pain.  Skin: Negative for color change.  Allergic/Immunologic: Negative for environmental allergies and food allergies.  Neurological: Negative for dizziness and headaches.  Hematological: Does not bruise/bleed easily.  Psychiatric/Behavioral: Negative for agitation, behavioral problems (depression) and hallucinations.   Vital Signs: BP 105/77   Pulse 91   Temp 97.8 F (36.6 C)   Resp 16   Ht _0  (1.549 m)   Wt 130 lb 12.8 oz (59.3 kg)   SpO2 96%   BMI 24.71 kg/m   Physical Exam Constitutional:      General: She is not in acute distress.     Appearance: Normal appearance. She is normal weight.  HENT:     Head: Normocephalic and atraumatic.  Cardiovascular:     Rate and Rhythm: Normal rate and regular rhythm.     Pulses: Normal pulses.     Heart sounds: Normal heart sounds.  Pulmonary:     Effort: Pulmonary effort is normal.     Breath sounds: Normal breath sounds. No rhonchi.  Abdominal:     Palpations: Abdomen is soft.  Musculoskeletal:        General: Deformity present.     Comments: Scoliosis noticed   Neurological:     General: No focal deficit present.     Mental Status: She is alert and oriented to  person, place, and time.  Psychiatric:        Mood and Affect: Mood normal.    Assessment/Plan: 1. Age related osteoporosis, unspecified pathological fracture presence - Son will like to look into her insurance coverage, will call back if change of tehrapy required, might need to see rheumatology for possible IV therapy  - ibandronate (BONIVA) 150 MG tablet; Take 1 tablet (150 mg total) by mouth every 30 (thirty) days. Take in the morning with a full glass of water, on an empty stomach, and do not take anything else by mouth or lie down for the next 30 min.  Dispense: 1 tablet; Refill: 3  2. MAI (mycobacterium avium-intracellulare) ([place]) - Will need follow up with Pulmonary   3. Obstructive chronic bronchitis without exacerbation ([place]) - Controlled   4. Essential hypertension - Stop Cardizem due to hypotension   5. Flu vaccine need - Flu Vaccine MDCK QUAD PF  General Counseling: Alexsandra verbalizes understanding of the findings of todays visit and agrees with plan of treatment. I have discussed any further diagnostic evaluation that may be needed or ordered today. We also reviewed her medications today. she has been encouraged to call the office with any questions or concerns that should arise related to todays visit.  Orders Placed This Encounter  Procedures  . Flu Vaccine MDCK QUAD PF   Meds ordered this  encounter  Medications  . ibandronate (BONIVA) 150 MG tablet    Sig: Take 1 tablet (150 mg total) by mouth every 30 (thirty) days. Take in the morning with a full glass of water, on an empty stomach, and do not take anything else by mouth or lie down for the next 30 min.    Dispense:  1 tablet    Refill:  3   Time spent:25 Minutes Dr Lavera Guise Internal medicine

## 2019-10-15 ENCOUNTER — Telehealth: Payer: Self-pay

## 2019-10-15 NOTE — Telephone Encounter (Signed)
Confirmed appointment with patient. klh °

## 2019-10-21 ENCOUNTER — Encounter: Payer: Self-pay | Admitting: Internal Medicine

## 2019-10-21 ENCOUNTER — Encounter (INDEPENDENT_AMBULATORY_CARE_PROVIDER_SITE_OTHER): Payer: Self-pay

## 2019-10-21 ENCOUNTER — Other Ambulatory Visit: Payer: Self-pay

## 2019-10-21 ENCOUNTER — Ambulatory Visit (INDEPENDENT_AMBULATORY_CARE_PROVIDER_SITE_OTHER): Payer: Medicare Other | Admitting: Internal Medicine

## 2019-10-21 ENCOUNTER — Other Ambulatory Visit: Payer: Self-pay | Admitting: Internal Medicine

## 2019-10-21 VITALS — BP 136/74 | HR 100 | Temp 97.5°F | Resp 16 | Ht 61.0 in | Wt 132.0 lb

## 2019-10-21 DIAGNOSIS — G729 Myopathy, unspecified: Secondary | ICD-10-CM | POA: Diagnosis not present

## 2019-10-21 DIAGNOSIS — R0602 Shortness of breath: Secondary | ICD-10-CM

## 2019-10-21 DIAGNOSIS — Z9981 Dependence on supplemental oxygen: Secondary | ICD-10-CM | POA: Diagnosis not present

## 2019-10-21 DIAGNOSIS — J449 Chronic obstructive pulmonary disease, unspecified: Secondary | ICD-10-CM

## 2019-10-21 DIAGNOSIS — A310 Pulmonary mycobacterial infection: Secondary | ICD-10-CM

## 2019-10-21 MED ORDER — RIFAMPIN 150 MG PO CAPS: 150.0000 mg | ORAL_CAPSULE | Freq: Every day | ORAL | 5 refills | Status: DC

## 2019-10-21 MED ORDER — ETHAMBUTOL HCL 400 MG PO TABS: 400.0000 mg | ORAL_TABLET | Freq: Every day | ORAL | 5 refills | Status: DC

## 2019-10-21 MED ORDER — AZITHROMYCIN 250 MG PO TABS: ORAL_TABLET | ORAL | 5 refills | Status: DC

## 2019-10-21 NOTE — Patient Instructions (Signed)
Mycobacterium Avium Complex Mycobacterium avium complex (MAC) is an infection caused by two similar and very common types of bacteria. MAC causes two types of infection:  Local infection. This is limited to one area. If you have a normal disease-fighting system (immune system), you are more likely to get this type of infection.  Disseminated infection. This is an infection that affects all parts of the body. It is more serious than a local infection. You are more likely to get this infection if you have a weak immune system. MAC infections most often affect the lungs. MAC is not contagious. This means that it does not spread from person to person. What are the causes? This condition is caused by two kinds of bacteria, Mycobacterium avium and Mycobacterium intracellulare. These bacteria are often found in drinking water, hot tubs, swimming pools, house dust, and animals. You may become infected from:  Breathing in water particles or dust particles that contain the bacteria (contaminated).  Eating or drinking something that is contaminated.  Drinking milk. MAC bacteria can grow in pasteurized milk. What increases the risk? The following factors may make you more likely to develop this condition:  Having HIV or AIDS.  Being a child.  Being an older woman.  Smoking cigarettes.  Having long-term (chronic) lung diseases, such as: ? Tuberculosis. ? Chronic obstructive pulmonary disease (COPD). ? Lung cancer. ? Cystic fibrosis. What are the signs or symptoms? Symptoms vary depending on the type of MAC infection you have. Symptoms of a local infection  Lymph nodes that are bigger than usual (enlarged). Enlarged lymph nodes may be: ? On one side of the neck. ? Under the jaw. ? Around the ear.  Fatigue.  Shortness of breath.  A long-term cough that produces lots of mucus.  Abnormal sounds when breathing. A health care provider may hear this when listening to your lungs. Symptoms  of a disseminated infection  Fever.  Night sweats.  Weight loss.  Loss of appetite.  Fatigue. How is this diagnosed? This condition may be diagnosed based on:  Your symptoms.  A physical exam.  Your medical history. You may also have tests, including:  Chest X-ray or CT scan.  Blood tests.  Test of mucus from your lungs (sputum) or other body fluids to see whether bacteria will grow (culture).  Removal of a piece of body tissue to be checked under a microscope (biopsy). How is this treated? Treatment for this condition depends on the type of infection. Treatment may include:  Taking antibiotic medicines. You may have to take antibiotics until MAC bacteria have stopped growing in cultures.  Surgery to remove infected lymph nodes. After the lymph nodes are removed, antibiotics are usually not needed. In some people with a disseminated infection, treatment with antibiotic medicines may continue for several years. Follow these instructions at home: Medicines   Take over-the-counter and prescription medicines only as told by your health care provider.  Take your antibiotic medicine as told by your health care provider. Do not stop taking the antibiotic even if you start to feel better. Eating and drinking   Eat a healthy, well-balanced diet. Talk with your health care provider or a diet and nutrition specialist (dietitian) about what food choices are best for you.  Drink enough fluid to keep your urine pale yellow. General instructions  Seek medical care for any underlying health conditions you have. Follow your health care provider's instructions about how to manage those conditions.  Do not use any products that  contain nicotine or tobacco, such as cigarettes and e-cigarettes. If you need help quitting, ask your health care provider.  Keep all follow-up visits as told by your health care provider. This is important. How is this prevented?   Stay up-to-date on  your vaccines.  Take all of your medicines as told by your health care provider, especially if you have problems with your immune system.  Avoid drinking water that may not be clean. [place], rivers, and other open water sources can contain germs that cause infections.  Wash your hands often with soap and water. If soap and water are not available, use hand sanitizer.  Practice safe food preparation, especially if your immune system is weak because of an underlying illness. ? Store foods at safe temperatures. ? Avoid eating raw or undercooked meats, poultry, fish, shellfish, and eggs. ? Wash and peel fruits and vegetables before eating or cooking them. ? Use separate food preparation surfaces and storage spaces for raw meat and for fruits and vegetables. ? Wash your hands, food preparation surfaces, and utensils thoroughly before and after you handle raw foods. Contact a health care provider if you:  Have chills or a fever.  Have a cough that does not go away.  Have loss of appetite or weight loss.  Feel very tired. Get help right away if you:  Have a fever for more than 2-3 days.  Cough up blood.  Have trouble breathing.  Have chest pain. Summary  Mycobacterium avium complex (MAC) is an infection that is caused by two similar and very common types of bacteria.  If you have a normal disease-fighting system (immune system), you are more likely to get an infection that is limited to one area of your body (local infection).  If you have a weak immune system, you are likely to get infections in all parts of your body (disseminated infection).  Treatment for this condition is difficult because MAC does not respond to many common antibiotic medicines. Treatment depends on the type of infection, and may involve surgery or antibiotics. This information is not intended to replace advice given to you by your health care provider. Make sure you discuss any questions you have with your  health care provider. Document Released: 11/12/2013 Document Revised: 05/14/2018 Document Reviewed: 12/15/2017 Elsevier Patient Education  2020 [employer].

## 2019-10-21 NOTE — Progress Notes (Signed)
[facility] [place], [place] [address]  Pulmonary Sleep Medicine   Office Visit Note  Patient Name: Breanna Christian DOB: 1941-07-03 MRN [id number]  Date of Service: 10/21/2019  Complaints/HPI: Patient is here with her son for follow-up.  Apparently she has not been taking her rifampin and ethambutol.  She states that she every now and then will take it but has not been taking it on a regular basis.  I had a very important conversation with her to explain to her that she needs to be taking it regularly or she should not take it at all because this may lead to resistance to the medications and if she gets sick in the future we may not have much options as far as therapy for her underlying infection.  Her son understands and they are now going to directly supervise her as far as taking medications.  In addition I also explained to her that she does need to be on 3 drug therapy according to the most recent recommendations in October of this year she should be on azithromycin rifampin and ethambutol.  She states overall she does feel good she does not have much in the way of coughing up any sputum she does not have any increase in her shortness of breath.  ROS  General: (-) fever, (-) chills, (-) night sweats, (-) weakness Skin: (-) rashes, (-) itching,. Eyes: (-) visual changes, (-) redness, (-) itching. Nose and Sinuses: (-) nasal stuffiness or itchiness, (-) postnasal drip, (-) nosebleeds, (-) sinus trouble. Mouth and Throat: (-) sore throat, (-) hoarseness. Neck: (-) swollen glands, (-) enlarged thyroid, (-) neck pain. Respiratory: + cough, (-) bloody sputum, - shortness of breath, - wheezing. Cardiovascular: - ankle swelling, (-) chest pain. Lymphatic: (-) lymph node enlargement. Neurologic: (-) numbness, (-) tingling. Psychiatric: (-) anxiety, (-) depression   Current Medication: Outpatient Encounter Medications as of 10/21/2019  Medication Sig  .  ARTIFICIAL TEAR OP Place 1 drop into both eyes every 6 (six) hours as needed (dry eyes). A product from [place].  Marland Kitchen ethambutol (MYAMBUTOL) 400 MG tablet 1 TAB BY MOUTH TWICE A DAY  . ibandronate (BONIVA) 150 MG tablet Take 1 tablet (150 mg total) by mouth every 30 (thirty) days. Take in the morning with a full glass of water, on an empty stomach, and do not take anything else by mouth or lie down for the next 30 min.  Marland Kitchen ibuprofen (ADVIL,MOTRIN) 200 MG tablet Take 200 mg by mouth as needed.  . Multiple Vitamin (MULTI-VITAMINS) TABS Take 1 tablet by mouth daily. Reported on 04/25/2016  . Omega-3 Fatty Acids (OMEGA-3 FISH OIL) 300 MG CAPS Take by mouth daily.  . rifampin (RIFADIN) 150 MG capsule Take 1 capsule (150 mg total) by mouth 2 (two) times daily.  Marland Kitchen SPIRIVA HANDIHALER 18 MCG inhalation capsule INHALE 1 CAPSULE VIA HANDIHALER ONCE DAILY AT THE SAME TIME EVERY DAY  . Vitamin D, Ergocalciferol, (DRISDOL) 50000 units CAPS capsule Take 50,000 Units by mouth every 7 (seven) days. Takes on Sundays   Facility-Administered Encounter Medications as of 10/21/2019  Medication  . ipratropium-albuterol (DUONEB) 0.5-2.5 (3) MG/3ML nebulizer solution 3 mL    Surgical History: Past Surgical History:  Procedure Laterality Date  . APPENDECTOMY    . BRONCHOSCOPY  02/2016  . CATARACT EXTRACTION W/PHACO Right 10/26/2016   Procedure: CATARACT EXTRACTION PHACO AND INTRAOCULAR LENS PLACEMENT (IOC);  Surgeon: Estill Cotta, MD;  Location: [facility] ORS;  Service: Ophthalmology;  Laterality: Right;  [place] 2.17 AP% 26.5 CDE 56.86 Fluid pack lot # [id number]  . FLEXIBLE BRONCHOSCOPY Bilateral 03/16/2016   Procedure: FLEXIBLE BRONCHOSCOPY;  Surgeon: Allyne Gee, MD;  Location: [facility] ORS;  Service: Pulmonary;  Laterality: Bilateral;  . FLEXIBLE BRONCHOSCOPY N/A 03/03/2017   Procedure: FLEXIBLE BRONCHOSCOPY;  Surgeon: Allyne Gee, MD;  Location: [facility] ORS;  Service: Pulmonary;  Laterality: N/A;  . TUBAL LIGATION       Medical History: Past Medical History:  Diagnosis Date  . Bronchiectasis ([place]) 2011  . COPD (chronic obstructive pulmonary disease) ([place])   . HOH (hard of hearing)   . Hypertension   . Lung nodules   . Non-Hodgkin lymphoma ([place]) 2003  . Pneumonia    FREQUENT IN PAST    H/O MYCOBACTERIAL  . Rheumatoid arthritis ([place])   . Sjogren's syndrome ([place])   . Xerostomia     Family History: History reviewed. No pertinent family history.  Social History: Social History   Socioeconomic History  . Marital status: Married    Spouse name: Not on file  . Number of children: Not on file  . Years of education: Not on file  . Highest education level: Not on file  Occupational History  . Not on file  Social Needs  . Financial resource strain: Not on file  . Food insecurity    Worry: Not on file    Inability: Not on file  . Transportation needs    Medical: Not on file    Non-medical: Not on file  Tobacco Use  . Smoking status: Never Smoker  . Smokeless tobacco: Never Used  Substance and Sexual Activity  . Alcohol use: No  . Drug use: No  . Sexual activity: Not on file  Lifestyle  . Physical activity    Days per week: Not on file    Minutes per session: Not on file  . Stress: Not on file  Relationships  . Social Herbalist on phone: Not on file    Gets together: Not on file    Attends religious service: Not on file    Active member of club or organization: Not on file    Attends meetings of clubs or organizations: Not on file    Relationship status: Not on file  . Intimate partner violence    Fear of current or ex partner: Not on file    Emotionally abused: Not on file    Physically abused: Not on file    Forced sexual activity: Not on file  Other Topics Concern  . Not on file  Social History Narrative  . Not on file    Vital Signs: Blood pressure 136/74, pulse 100, temperature (!) 97.5 F (36.4 C), resp. rate 16, height _0  (1.549 m), weight 132 lb  (59.9 kg), SpO2 99 %.  Examination: General Appearance: The patient is well-developed, well-nourished, and in no distress. Skin: Gross inspection of skin unremarkable. Head: normocephalic, no gross deformities. Eyes: no gross deformities noted. ENT: ears appear grossly normal no exudates. Neck: Supple. No thyromegaly. No LAD. Respiratory: few crackles noted. Cardiovascular: Normal S1 and S2 without murmur or rub. Extremities: No cyanosis. pulses are equal. Neurologic: Alert and oriented. No involuntary movements.  LABS: Recent Results (from the past 2160 hour(s))  CBC with Differential/Platelet     Status: None   Collection Time: 07/30/19 11:27 AM  Result Value Ref Range   WBC 4.5 3.4 - 10.8 x10E3/uL   RBC 4.86 3.77 - 5.28 x10E6/uL  Hemoglobin 13.6 11.1 - 15.9 g/dL   Hematocrit 41.1 34.0 - 46.6 %   MCV 85 79 - 97 fL   MCH 28.0 26.6 - 33.0 pg   MCHC 33.1 31.5 - 35.7 g/dL   RDW 12.7 11.7 - 15.4 %   Platelets 276 150 - 450 x10E3/uL   Neutrophils 64 Not Estab. %   Lymphs 21 Not Estab. %   Monocytes 11 Not Estab. %   Eos 3 Not Estab. %   Basos 1 Not Estab. %   Neutrophils Absolute 2.9 1.4 - 7.0 x10E3/uL   Lymphocytes Absolute 1.0 0.7 - 3.1 x10E3/uL   Monocytes Absolute 0.5 0.1 - 0.9 x10E3/uL   EOS (ABSOLUTE) 0.1 0.0 - 0.4 x10E3/uL   Basophils Absolute 0.1 0.0 - 0.2 x10E3/uL   Immature Granulocytes 0 Not Estab. %   Immature Grans (Abs) 0.0 0.0 - 0.1 x10E3/uL  Comprehensive metabolic panel     Status: None   Collection Time: 07/30/19 11:27 AM  Result Value Ref Range   Glucose 95 65 - 99 mg/dL   BUN 13 8 - 27 mg/dL   Creatinine, Ser 0.71 0.57 - 1.00 mg/dL   GFR calc non Af Amer 82 >59 mL/min/1.73   GFR calc Af Amer 95 >59 mL/min/1.73   BUN/Creatinine Ratio 18 12 - 28   Sodium 140 134 - 144 mmol/L   Potassium 4.6 3.5 - 5.2 mmol/L   Chloride 100 96 - 106 mmol/L   CO2 24 20 - 29 mmol/L   Calcium 9.3 8.7 - 10.3 mg/dL   Total Protein 6.8 6.0 - 8.5 g/dL   Albumin 4.2 3.7 -  4.7 g/dL   Globulin, Total 2.6 1.5 - 4.5 g/dL   Albumin/Globulin Ratio 1.6 1.2 - 2.2   Bilirubin Total 0.4 0.0 - 1.2 mg/dL   Alkaline Phosphatase 115 39 - 117 IU/L   AST 27 0 - 40 IU/L   ALT 21 0 - 32 IU/L  Lipid Panel With LDL/HDL Ratio     Status: None   Collection Time: 07/30/19 11:27 AM  Result Value Ref Range   Cholesterol, Total 157 100 - 199 mg/dL   Triglycerides 106 0 - 149 mg/dL   HDL 54 >39 mg/dL   VLDL Cholesterol Cal 19 5 - 40 mg/dL   LDL Chol Calc (NIH) 84 0 - 99 mg/dL   LDL/HDL Ratio 1.6 0.0 - 3.2 ratio    Comment:                                     LDL/HDL Ratio                                             Men  Women                               1/2 Avg.Risk  1.0    1.5                                   Avg.Risk  3.6    3.2  2X Avg.Risk  6.2    5.0                                3X Avg.Risk  8.0    6.1   Iron and TIBC     Status: None   Collection Time: 07/30/19 11:27 AM  Result Value Ref Range   Total Iron Binding Capacity 298 250 - 450 ug/dL   UIBC 213 118 - 369 ug/dL   Iron 85 27 - 139 ug/dL   Iron Saturation 29 15 - 55 %  B12 and Folate Panel     Status: None   Collection Time: 07/30/19 11:27 AM  Result Value Ref Range   Vitamin B-12 531 232 - 1,245 pg/mL   Folate 16.8 >3.0 ng/mL    Comment: A serum folate concentration of less than 3.1 ng/mL is considered to represent clinical deficiency.   T4, free     Status: None   Collection Time: 07/30/19 11:27 AM  Result Value Ref Range   Free T4 1.22 0.82 - 1.77 ng/dL  TSH     Status: None   Collection Time: 07/30/19 11:27 AM  Result Value Ref Range   TSH 1.060 0.450 - 4.500 uIU/mL  VITAMIN D 25 Hydroxy (Vit-D Deficiency, Fractures)     Status: None   Collection Time: 07/30/19 11:27 AM  Result Value Ref Range   Vit D, 25-Hydroxy 39.4 30.0 - 100.0 ng/mL    Comment: Vitamin D deficiency has been defined by the [place] and an Endocrine Society practice guideline as  a level of serum 25-OH vitamin D less than 20 ng/mL (1,2). The Endocrine Society went on to further define vitamin D insufficiency as a level between 21 and 29 ng/mL (2). 1. IOM (Institute of Medicine). 2010. Dietary reference    intakes for calcium and D. [place]: The    [employer]. 2. Holick MF, Binkley Baxley, Bischoff-Ferrari HA, et al.    Evaluation, treatment, and prevention of vitamin D    deficiency: an Endocrine Society clinical practice    guideline. JCEM. 2011 Jul; 96(7):1911-30.   Ferritin     Status: None   Collection Time: 07/30/19 11:27 AM  Result Value Ref Range   Ferritin 36 15 - 150 ng/mL    Radiology: Dg Bone Density  Result Date: 12/19/2018 EXAM: DUAL X-RAY ABSORPTIOMETRY (DXA) FOR BONE MINERAL DENSITY IMPRESSION: Technologist: SCE PATIENT BIOGRAPHICAL: Name: Tayllor, Breitenstein Patient ID: [id number] Birth Date: 1941-07-29 Height: 61.5 in. Gender: Female Exam Date: 12/19/2018 Weight: 131.5 lbs. Indications: Advanced Age, Caucasian, COPD, Height Loss, History of Osteoporosis, Parent Hip Fracture, Postmenopausal, Rheumatoid Arthritis Fractures: Treatments: Multi-Vitamin, ProAir inhaler, Spiriva, Vitamin D ASSESSMENT: The BMD measured at AP Spine L1-L4 is 0.781 g/cm2 with a T-score of -3.3. This patient is considered osteoporotic according to [facility]) criteria. The quality of the scan is good. Site Region Measured Measured WHO Young Adult BMD Date       Age      Classification T-score AP Spine L1-L4 12/19/2018 76.6 Osteoporosis -3.3 0.781 g/cm2 DualFemur Total Right 12/19/2018 76.6 Osteoporosis -2.6 0.676 g/cm2 World Health Organization [facility]) criteria for post-menopausal, Caucasian Women: Normal:       T-score at or above -1 SD Osteopenia:   T-score between -1 and -2.5 SD Osteoporosis: T-score at or below -2.5 SD RECOMMENDATIONS: 1. All patients should optimize calcium and vitamin D intake. 2. Consider  FDA-approved medical therapies in  postmenopausal women and men aged 29 years and older, based on the following: a. A hip or vertebral(clinical or morphometric) fracture b. T-score < -2.5 at the femoral neck or spine after appropriate evaluation to exclude secondary causes c. Low bone mass (T-score between -1.0 and -2.5 at the femoral neck or spine) and a 10-year probability of a hip fracture > 3% or a 10-year probability of a major osteoporosis-related fracture > 20% based on the [place]-adapted WHO algorithm d. Clinician judgment and/or patient preferences may indicate treatment for people with 10-year fracture probabilities above or below these levels FOLLOW-UP: People with diagnosed cases of osteoporosis or at high risk for fracture should have regular bone mineral density tests. For patients eligible for Medicare, routine testing is allowed once every 2 years. The testing frequency can be increased to one year for patients who have rapidly progressing disease, those who are receiving or discontinuing medical therapy to restore bone mass, or have additional risk factors. Electronically Signed   By: Earle Gell M.D.   On: 12/19/2018 10:56   Mm 3d Screen Breast Bilateral  Result Date: 12/25/2018 CLINICAL DATA:  Screening. EXAM: DIGITAL SCREENING BILATERAL MAMMOGRAM WITH TOMO AND CAD COMPARISON:  Previous exam(s). ACR Breast Density Category b: There are scattered areas of fibroglandular density. FINDINGS: There are no findings suspicious for malignancy. Images were processed with CAD. IMPRESSION: No mammographic evidence of malignancy. A result letter of this screening mammogram will be mailed directly to the patient. RECOMMENDATION: Screening mammogram in one year. (Code:SM-B-01Y) BI-RADS CATEGORY  1: Negative. Electronically Signed   By: Dorise Bullion III M.D   On: 12/25/2018 12:13    No results found.  No results found.    Assessment and Plan: Patient Active Problem List   Diagnosis Date Noted  . Age related osteoporosis 10/15/2018   . Ovarian failure 10/15/2018  . Screening for breast cancer 04/29/2018  . Essential hypertension 04/29/2018  . Obstructive chronic bronchitis without exacerbation ([place]) 04/29/2018  . MAI (mycobacterium avium-intracellulare) ([place]) 02/08/2018  . Numbness and tingling 03/25/2015  . Myopathy 02/19/2015  . Polyneuropathy 02/18/2015  . Weakness of both lower extremities 02/18/2015    1. COPD she will need to have follow-up pulmonary functions done we did do a spirometry on her the results of which were reviewed with her she does have a slight reduction in her FEV1 this will continue to be monitored.  Also explained to her that if she is not taking her medications as prescribed it may also lead to further decline in her lung function. 2. MAI she needs to either go back on her medications as prescribed or stay off of them I have explained to her that intermittent use of the medications is not a good idea unless she uses them on a 3 days a week regular basis again is the more important thing 3. Oxygen dependent she needs to continue with her oxygen therapy 4. Myopathy this is improved 5. NonComplliance please see the discussion about for noncompliance issues again discussed at length with her and her son and they are going to now do direct supervision of her medications  General Counseling: I have discussed the findings of the evaluation and examination with Terisa.  I have also discussed any further diagnostic evaluation thatmay be needed or ordered today. Cierria verbalizes understanding of the findings of todays visit. We also reviewed her medications today and discussed drug interactions and side effects including but not limited excessive drowsiness and  altered mental states. We also discussed that there is always a risk not just to her but also people around her. she has been encouraged to call the office with any questions or concerns that should arise related to todays visit.  Orders  Placed This Encounter  Procedures  . Spirometry with Graph    Order Specific Question:   Where should this test be performed?    Answer:   [facility]    Order Specific Question:   Basic spirometry    Answer:   Yes     Time spent: 25 minutes greater than 50% of the time was spent in counseling  I have personally obtained a history, examined the patient, evaluated laboratory and imaging results, formulated the assessment and plan and placed orders.    Allyne Gee, MD [facility] Pulmonary and Critical Care Sleep medicine

## 2019-10-23 ENCOUNTER — Other Ambulatory Visit: Payer: Self-pay | Admitting: Internal Medicine

## 2019-10-23 DIAGNOSIS — A310 Pulmonary mycobacterial infection: Secondary | ICD-10-CM

## 2019-10-24 ENCOUNTER — Other Ambulatory Visit: Payer: Self-pay

## 2019-10-24 DIAGNOSIS — A310 Pulmonary mycobacterial infection: Secondary | ICD-10-CM

## 2019-10-24 MED ORDER — ETHAMBUTOL HCL 400 MG PO TABS: 400.0000 mg | ORAL_TABLET | Freq: Every day | ORAL | 5 refills | Status: DC

## 2019-10-24 MED ORDER — RIFAMPIN 150 MG PO CAPS: 150.0000 mg | ORAL_CAPSULE | Freq: Every day | ORAL | 5 refills | Status: DC

## 2019-10-28 ENCOUNTER — Other Ambulatory Visit: Payer: Self-pay

## 2019-10-28 DIAGNOSIS — A310 Pulmonary mycobacterial infection: Secondary | ICD-10-CM

## 2019-10-28 MED ORDER — ETHAMBUTOL HCL 400 MG PO TABS: 400.0000 mg | ORAL_TABLET | Freq: Every day | ORAL | 5 refills | Status: DC

## 2019-10-28 MED ORDER — RIFAMPIN 150 MG PO CAPS: 150.0000 mg | ORAL_CAPSULE | Freq: Every day | ORAL | 5 refills | Status: DC

## 2019-10-30 ENCOUNTER — Telehealth: Payer: Self-pay

## 2019-10-30 NOTE — Telephone Encounter (Signed)
Pt son called to verify that pt takes ethambutol and rifampin once daily as opposed to twice daily. Spoke with Dr. Lanice Schwab and advised pt sone for pt to take both medications and z pak once daily.

## 2019-11-04 ENCOUNTER — Other Ambulatory Visit: Payer: Self-pay | Admitting: Adult Health

## 2019-11-04 DIAGNOSIS — J9611 Chronic respiratory failure with hypoxia: Secondary | ICD-10-CM

## 2019-11-04 DIAGNOSIS — J449 Chronic obstructive pulmonary disease, unspecified: Secondary | ICD-10-CM

## 2019-11-20 ENCOUNTER — Other Ambulatory Visit: Payer: Self-pay | Admitting: Internal Medicine

## 2019-11-20 ENCOUNTER — Other Ambulatory Visit: Payer: Self-pay

## 2019-11-20 DIAGNOSIS — M810 Age-related osteoporosis without current pathological fracture: Secondary | ICD-10-CM

## 2019-11-25 ENCOUNTER — Encounter: Payer: Self-pay | Admitting: Internal Medicine

## 2019-12-13 ENCOUNTER — Telehealth: Payer: Self-pay

## 2019-12-13 NOTE — Telephone Encounter (Signed)
PRIOR AUTHORIZATION FOR SPRIVA HANDIHALER HAS BEEN APPROVED...VALID FROM 11/13/2019-12/12/2020.TAT

## 2019-12-20 ENCOUNTER — Telehealth: Payer: Self-pay

## 2019-12-20 NOTE — Telephone Encounter (Signed)
lmom for 12-24-19 ov as virtual.

## 2019-12-24 ENCOUNTER — Ambulatory Visit (INDEPENDENT_AMBULATORY_CARE_PROVIDER_SITE_OTHER): Payer: Medicare Other | Admitting: Internal Medicine

## 2019-12-24 DIAGNOSIS — J449 Chronic obstructive pulmonary disease, unspecified: Secondary | ICD-10-CM

## 2019-12-24 DIAGNOSIS — A310 Pulmonary mycobacterial infection: Secondary | ICD-10-CM | POA: Diagnosis not present

## 2019-12-24 DIAGNOSIS — M810 Age-related osteoporosis without current pathological fracture: Secondary | ICD-10-CM | POA: Diagnosis not present

## 2019-12-24 DIAGNOSIS — Z1231 Encounter for screening mammogram for malignant neoplasm of breast: Secondary | ICD-10-CM | POA: Diagnosis not present

## 2019-12-24 NOTE — Progress Notes (Signed)
[facility] [place], [place] [address]  Internal MEDICINE  Office Visit Note  Patient Name: Breanna Christian  [id number]  [id number]  Date of Service: 01/01/2020  Chief Complaint  Patient presents with  . Osteoporosis    HPI Pt is here for follow up with her daughter. She is [place] and speaks very little [place], overall she feels fine, taking her Boniva with no problems, C/O arthritis off and on but not too bad, pt also has hx of chronic pulmonary infection with MAI. She is on chronic suppressive therapy with Ethambutol, Rifampin and Azithromycin ( duration is unknown at this time). No other complaints today. She is active at home and able to do all her ADL's    Current Medication: Outpatient Encounter Medications as of 12/24/2019  Medication Sig  . ARTIFICIAL TEAR OP Place 1 drop into both eyes every 6 (six) hours as needed (dry eyes). A product from [place].  Marland Kitchen azithromycin (ZITHROMAX) 250 MG tablet Take on tablet daily  . ethambutol (MYAMBUTOL) 400 MG tablet Take 1 tablet (400 mg total) by mouth daily.  Marland Kitchen ibandronate (BONIVA) 150 MG tablet TAKE 1 TABLET (150 MG TOTAL) BY MOUTH EVERY 30 (THIRTY) DAYS. TAKE IN THE MORNING WITH A FULL GLASS OF WATER, ON AN EMPTY STOMACH, AND DO NOT TAKE ANYTHING ELSE BY MOUTH OR LIE DOWN FOR THE NEXT 30 MIN.  Marland Kitchen ibuprofen (ADVIL,MOTRIN) 200 MG tablet Take 200 mg by mouth as needed.  . Multiple Vitamin (MULTI-VITAMINS) TABS Take 1 tablet by mouth daily. Reported on 04/25/2016  . Omega-3 Fatty Acids (OMEGA-3 FISH OIL) 300 MG CAPS Take by mouth daily.  . rifampin (RIFADIN) 150 MG capsule Take 1 capsule (150 mg total) by mouth daily.  Marland Kitchen SPIRIVA HANDIHALER 18 MCG inhalation capsule INHALE 1 CAPSULE VIA HANDIHALER ONCE DAILY AT THE SAME TIME EVERY DAY  . Vitamin D, Ergocalciferol, (DRISDOL) 50000 units CAPS capsule Take 50,000 Units by mouth every 7 (seven) days. Takes on Sundays   Facility-Administered Encounter Medications as of  12/24/2019  Medication  . ipratropium-albuterol (DUONEB) 0.5-2.5 (3) MG/3ML nebulizer solution 3 mL    Surgical History: Past Surgical History:  Procedure Laterality Date  . APPENDECTOMY    . BRONCHOSCOPY  02/2016  . CATARACT EXTRACTION W/PHACO Right 10/26/2016   Procedure: CATARACT EXTRACTION PHACO AND INTRAOCULAR LENS PLACEMENT (IOC);  Surgeon: Estill Cotta, MD;  Location: [facility] ORS;  Service: Ophthalmology;  Laterality: Right;  [place] 2.17 AP% 26.5 CDE 56.86 Fluid pack lot # [id number]  . FLEXIBLE BRONCHOSCOPY Bilateral 03/16/2016   Procedure: FLEXIBLE BRONCHOSCOPY;  Surgeon: Allyne Gee, MD;  Location: [facility] ORS;  Service: Pulmonary;  Laterality: Bilateral;  . FLEXIBLE BRONCHOSCOPY N/A 03/03/2017   Procedure: FLEXIBLE BRONCHOSCOPY;  Surgeon: Allyne Gee, MD;  Location: [facility] ORS;  Service: Pulmonary;  Laterality: N/A;  . TUBAL LIGATION      Medical History: Past Medical History:  Diagnosis Date  . Bronchiectasis ([place]) 2011  . COPD (chronic obstructive pulmonary disease) ([place])   . HOH (hard of hearing)   . Hypertension   . Lung nodules   . Non-Hodgkin lymphoma ([place]) 2003  . Pneumonia    FREQUENT IN PAST    H/O MYCOBACTERIAL  . Rheumatoid arthritis ([place])   . Sjogren's syndrome ([place])   . Xerostomia     Family History: No family history on file.  Social History   Socioeconomic History  . Marital status: Married    Spouse name: Not on file  .  Number of children: Not on file  . Years of education: Not on file  . Highest education level: Not on file  Occupational History  . Not on file  Tobacco Use  . Smoking status: Never Smoker  . Smokeless tobacco: Never Used  Substance and Sexual Activity  . Alcohol use: No  . Drug use: No  . Sexual activity: Not on file  Other Topics Concern  . Not on file  Social History Narrative  . Not on file   Social Determinants of Health   Financial Resource Strain:   . Difficulty of Paying Living Expenses: Not on file  Food  Insecurity:   . Worried About Charity fundraiser in the Last Year: Not on file  . Ran Out of Food in the Last Year: Not on file  Transportation Needs:   . Lack of Transportation (Medical): Not on file  . Lack of Transportation (Non-Medical): Not on file  Physical Activity:   . Days of Exercise per Week: Not on file  . Minutes of Exercise per Session: Not on file  Stress:   . Feeling of Stress : Not on file  Social Connections:   . Frequency of Communication with Friends and Family: Not on file  . Frequency of Social Gatherings with Friends and Family: Not on file  . Attends Religious Services: Not on file  . Active Member of Clubs or Organizations: Not on file  . Attends Archivist Meetings: Not on file  . Marital Status: Not on file  Intimate Partner Violence:   . Fear of Current or Ex-Partner: Not on file  . Emotionally Abused: Not on file  . Physically Abused: Not on file  . Sexually Abused: Not on file    Review of Systems  Constitutional: Negative for chills, diaphoresis and fatigue.  HENT: Negative for ear pain, postnasal drip and sinus pressure.   Eyes: Negative for photophobia, discharge, redness, itching and visual disturbance.  Respiratory: Negative for cough, shortness of breath and wheezing.   Cardiovascular: Negative for chest pain, palpitations and leg swelling.  Gastrointestinal: Negative for abdominal pain, constipation, diarrhea, nausea and vomiting.  Genitourinary: Negative for dysuria and flank pain.  Musculoskeletal: Negative for arthralgias, back pain, gait problem and neck pain.  Skin: Negative for color change.  Allergic/Immunologic: Negative for environmental allergies and food allergies.  Neurological: Negative for dizziness and headaches.  Hematological: Does not bruise/bleed easily.  Psychiatric/Behavioral: Negative for agitation, behavioral problems (depression) and hallucinations.    Vital Signs: BP 138/73   Pulse 92   Temp (!) 96.2  F (35.7 C)   Resp 16   Ht _0  (1.549 m)   Wt 135 lb 9.6 oz (61.5 kg)   SpO2 97%   BMI 25.62 kg/m    Physical Exam Constitutional:      Appearance: Normal appearance. She is normal weight.  HENT:     Head: Normocephalic and atraumatic.  Cardiovascular:     Rate and Rhythm: Normal rate and regular rhythm.  Pulmonary:     Effort: Pulmonary effort is normal.     Breath sounds: Normal breath sounds.  Musculoskeletal:        General: Normal range of motion.  Neurological:     Mental Status: She is alert.  Psychiatric:        Mood and Affect: Mood normal.        Behavior: Behavior normal.    Assessment/Plan: 1. Age related osteoporosis, unspecified pathological fracture presence Continue with  wt bearing exercises, Boniva, calcium and vit d   2. Encounter for mammogram to establish baseline mammogram - MM DIGITAL SCREENING BILATERAL; Future  3. Chronic obstructive pulmonary disease, unspecified COPD type ([place]) Stable   4. MAI (mycobacterium avium-intracellulare) ([place]) Per pulmonary pt will continue to take all 3 med  General Counseling: Dicie verbalizes understanding of the findings of todays visit and agrees with plan of treatment. I have discussed any further diagnostic evaluation that may be needed or ordered today. We also reviewed her medications today. she has been encouraged to call the office with any questions or concerns that should arise related to todays visit.  Orders Placed This Encounter  Procedures  . MM DIGITAL SCREENING BILATERAL    Total time spent:25 Minutes Time spent includes review of chart, medications, test results, and follow up plan with the patient.   Dr Lavera Guise Internal medicine

## 2020-01-01 ENCOUNTER — Encounter: Payer: Self-pay | Admitting: Internal Medicine

## 2020-01-08 ENCOUNTER — Telehealth: Payer: Self-pay

## 2020-01-08 NOTE — Telephone Encounter (Signed)
Pt son called that she need vitamin d refills as per dr Humphrey Rolls pt can takeOTC calcium with Vitamin D and I lmom to pt son to call us back

## 2020-01-15 ENCOUNTER — Telehealth: Payer: Self-pay

## 2020-01-15 NOTE — Telephone Encounter (Signed)
Confirmed appointment on 01/20/2020 and screened for covid.klh

## 2020-01-20 ENCOUNTER — Ambulatory Visit (INDEPENDENT_AMBULATORY_CARE_PROVIDER_SITE_OTHER): Payer: Medicare Other | Admitting: Internal Medicine

## 2020-01-20 ENCOUNTER — Encounter: Payer: Self-pay | Admitting: Internal Medicine

## 2020-01-20 ENCOUNTER — Other Ambulatory Visit: Payer: Self-pay

## 2020-01-20 VITALS — BP 122/88 | HR 104 | Temp 97.2°F | Resp 16 | Ht 61.0 in | Wt 134.8 lb

## 2020-01-20 DIAGNOSIS — Z9981 Dependence on supplemental oxygen: Secondary | ICD-10-CM

## 2020-01-20 DIAGNOSIS — G729 Myopathy, unspecified: Secondary | ICD-10-CM | POA: Diagnosis not present

## 2020-01-20 DIAGNOSIS — J449 Chronic obstructive pulmonary disease, unspecified: Secondary | ICD-10-CM

## 2020-01-20 DIAGNOSIS — R0602 Shortness of breath: Secondary | ICD-10-CM

## 2020-01-20 DIAGNOSIS — A310 Pulmonary mycobacterial infection: Secondary | ICD-10-CM

## 2020-01-20 NOTE — Progress Notes (Signed)
[facility] [place], [place] [address]  Pulmonary Sleep Medicine   Office Visit Note  Patient Name: Breanna Christian DOB: 1940-12-02 MRN [id number]  Date of Service: 01/20/2020  Complaints/HPI: Pt is here for follow up. Overall she is doing well.  She has a history of COPD, and MAI.  She does not currently use oxygen.  She still has her concentrator.   ROS  General: (-) fever, (-) chills, (-) night sweats, (-) weakness Skin: (-) rashes, (-) itching,. Eyes: (-) visual changes, (-) redness, (-) itching. Nose and Sinuses: (-) nasal stuffiness or itchiness, (-) postnasal drip, (-) nosebleeds, (-) sinus trouble. Mouth and Throat: (-) sore throat, (-) hoarseness. Neck: (-) swollen glands, (-) enlarged thyroid, (-) neck pain. Respiratory: - cough, (-) bloody sputum, - shortness of breath, - wheezing. Cardiovascular: - ankle swelling, (-) chest pain. Lymphatic: (-) lymph node enlargement. Neurologic: (-) numbness, (-) tingling. Psychiatric: (-) anxiety, (-) depression   Current Medication: Outpatient Encounter Medications as of 01/20/2020  Medication Sig  . ARTIFICIAL TEAR OP Place 1 drop into both eyes every 6 (six) hours as needed (dry eyes). A product from [place].  Marland Kitchen azithromycin (ZITHROMAX) 250 MG tablet Take on tablet daily  . ethambutol (MYAMBUTOL) 400 MG tablet Take 1 tablet (400 mg total) by mouth daily.  Marland Kitchen ibandronate (BONIVA) 150 MG tablet TAKE 1 TABLET (150 MG TOTAL) BY MOUTH EVERY 30 (THIRTY) DAYS. TAKE IN THE MORNING WITH A FULL GLASS OF WATER, ON AN EMPTY STOMACH, AND DO NOT TAKE ANYTHING ELSE BY MOUTH OR LIE DOWN FOR THE NEXT 30 MIN.  Marland Kitchen ibuprofen (ADVIL,MOTRIN) 200 MG tablet Take 200 mg by mouth as needed.  . Multiple Vitamin (MULTI-VITAMINS) TABS Take 1 tablet by mouth daily. Reported on 04/25/2016  . Omega-3 Fatty Acids (OMEGA-3 FISH OIL) 300 MG CAPS Take by mouth daily.  . rifampin (RIFADIN) 150 MG capsule Take 1 capsule (150 mg total) by mouth  daily.  Marland Kitchen SPIRIVA HANDIHALER 18 MCG inhalation capsule INHALE 1 CAPSULE VIA HANDIHALER ONCE DAILY AT THE SAME TIME EVERY DAY  . Vitamin D, Ergocalciferol, (DRISDOL) 50000 units CAPS capsule Take 50,000 Units by mouth every 7 (seven) days. Takes on Sundays   Facility-Administered Encounter Medications as of 01/20/2020  Medication  . ipratropium-albuterol (DUONEB) 0.5-2.5 (3) MG/3ML nebulizer solution 3 mL    Surgical History: Past Surgical History:  Procedure Laterality Date  . APPENDECTOMY    . BRONCHOSCOPY  02/2016  . CATARACT EXTRACTION W/PHACO Right 10/26/2016   Procedure: CATARACT EXTRACTION PHACO AND INTRAOCULAR LENS PLACEMENT (IOC);  Surgeon: Estill Cotta, MD;  Location: [facility] ORS;  Service: Ophthalmology;  Laterality: Right;  [place] 2.17 AP% 26.5 CDE 56.86 Fluid pack lot # [id number]  . FLEXIBLE BRONCHOSCOPY Bilateral 03/16/2016   Procedure: FLEXIBLE BRONCHOSCOPY;  Surgeon: Allyne Gee, MD;  Location: [facility] ORS;  Service: Pulmonary;  Laterality: Bilateral;  . FLEXIBLE BRONCHOSCOPY N/A 03/03/2017   Procedure: FLEXIBLE BRONCHOSCOPY;  Surgeon: Allyne Gee, MD;  Location: [facility] ORS;  Service: Pulmonary;  Laterality: N/A;  . TUBAL LIGATION      Medical History: Past Medical History:  Diagnosis Date  . Bronchiectasis ([place]) 2011  . COPD (chronic obstructive pulmonary disease) ([place])   . HOH (hard of hearing)   . Hypertension   . Lung nodules   . Mycobacterium avium complex colonization   . Non-Hodgkin lymphoma ([place]) 2003  . Pneumonia    FREQUENT IN PAST    H/O MYCOBACTERIAL  . Rheumatoid arthritis (  [place])   . Sjogren's syndrome ([place])   . Xerostomia     Family History: History reviewed. No pertinent family history.  Social History: Social History   Socioeconomic History  . Marital status: Married    Spouse name: Not on file  . Number of children: Not on file  . Years of education: Not on file  . Highest education level: Not on file  Occupational History  . Not on file   Tobacco Use  . Smoking status: Never Smoker  . Smokeless tobacco: Never Used  Substance and Sexual Activity  . Alcohol use: No  . Drug use: No  . Sexual activity: Not on file  Other Topics Concern  . Not on file  Social History Narrative  . Not on file   Social Determinants of Health   Financial Resource Strain:   . Difficulty of Paying Living Expenses: Not on file  Food Insecurity:   . Worried About Charity fundraiser in the Last Year: Not on file  . Ran Out of Food in the Last Year: Not on file  Transportation Needs:   . Lack of Transportation (Medical): Not on file  . Lack of Transportation (Non-Medical): Not on file  Physical Activity:   . Days of Exercise per Week: Not on file  . Minutes of Exercise per Session: Not on file  Stress:   . Feeling of Stress : Not on file  Social Connections:   . Frequency of Communication with Friends and Family: Not on file  . Frequency of Social Gatherings with Friends and Family: Not on file  . Attends Religious Services: Not on file  . Active Member of Clubs or Organizations: Not on file  . Attends Archivist Meetings: Not on file  . Marital Status: Not on file  Intimate Partner Violence:   . Fear of Current or Ex-Partner: Not on file  . Emotionally Abused: Not on file  . Physically Abused: Not on file  . Sexually Abused: Not on file    Vital Signs: Blood pressure 122/88, pulse (!) 104, temperature (!) 97.2 F (36.2 C), resp. rate 16, height _0  (1.549 m), weight 134 lb 12.8 oz (61.1 kg), SpO2 93 %.  Examination: General Appearance: The patient is well-developed, well-nourished, and in no distress. Skin: Gross inspection of skin unremarkable. Head: normocephalic, no gross deformities. Eyes: no gross deformities noted. ENT: ears appear grossly normal no exudates. Neck: Supple. No thyromegaly. No LAD. Respiratory: clear bilaterally. Cardiovascular: Normal S1 and S2 without murmur or rub. Extremities: No  cyanosis. pulses are equal. Neurologic: Alert and oriented. No involuntary movements.  LABS: No results found for this or any previous visit (from the past 2160 hour(s)).  Radiology: DG Bone Density  Result Date: 12/19/2018 EXAM: DUAL X-RAY ABSORPTIOMETRY (DXA) FOR BONE MINERAL DENSITY IMPRESSION: Technologist: SCE PATIENT BIOGRAPHICAL: Name: Brooklen, Runquist Patient ID: [id number] Birth Date: Mar 26, 1941 Height: 61.5 in. Gender: Female Exam Date: 12/19/2018 Weight: 131.5 lbs. Indications: Advanced Age, Caucasian, COPD, Height Loss, History of Osteoporosis, Parent Hip Fracture, Postmenopausal, Rheumatoid Arthritis Fractures: Treatments: Multi-Vitamin, ProAir inhaler, Spiriva, Vitamin D ASSESSMENT: The BMD measured at AP Spine L1-L4 is 0.781 g/cm2 with a T-score of -3.3. This patient is considered osteoporotic according to [facility]) criteria. The quality of the scan is good. Site Region Measured Measured WHO Young Adult BMD Date       Age      Classification T-score AP Spine L1-L4 12/19/2018 76.6 Osteoporosis -3.3 0.781 g/cm2  DualFemur Total Right 12/19/2018 76.6 Osteoporosis -2.6 0.676 g/cm2 World Health Organization [facility]) criteria for post-menopausal, Caucasian Women: Normal:       T-score at or above -1 SD Osteopenia:   T-score between -1 and -2.5 SD Osteoporosis: T-score at or below -2.5 SD RECOMMENDATIONS: 1. All patients should optimize calcium and vitamin D intake. 2. Consider FDA-approved medical therapies in postmenopausal women and men aged 66 years and older, based on the following: a. A hip or vertebral(clinical or morphometric) fracture b. T-score < -2.5 at the femoral neck or spine after appropriate evaluation to exclude secondary causes c. Low bone mass (T-score between -1.0 and -2.5 at the femoral neck or spine) and a 10-year probability of a hip fracture > 3% or a 10-year probability of a major osteoporosis-related fracture > 20% based on the [place]-adapted WHO algorithm  d. Clinician judgment and/or patient preferences may indicate treatment for people with 10-year fracture probabilities above or below these levels FOLLOW-UP: People with diagnosed cases of osteoporosis or at high risk for fracture should have regular bone mineral density tests. For patients eligible for Medicare, routine testing is allowed once every 2 years. The testing frequency can be increased to one year for patients who have rapidly progressing disease, those who are receiving or discontinuing medical therapy to restore bone mass, or have additional risk factors. Electronically Signed   By: Earle Gell M.D.   On: 12/19/2018 10:56   MM 3D SCREEN BREAST BILATERAL  Result Date: 12/25/2018 CLINICAL DATA:  Screening. EXAM: DIGITAL SCREENING BILATERAL MAMMOGRAM WITH TOMO AND CAD COMPARISON:  Previous exam(s). ACR Breast Density Category b: There are scattered areas of fibroglandular density. FINDINGS: There are no findings suspicious for malignancy. Images were processed with CAD. IMPRESSION: No mammographic evidence of malignancy. A result letter of this screening mammogram will be mailed directly to the patient. RECOMMENDATION: Screening mammogram in one year. (Code:SM-B-01Y) BI-RADS CATEGORY  1: Negative. Electronically Signed   By: Dorise Bullion III M.D   On: 12/25/2018 12:13    No results found.  No results found.    Assessment and Plan: Patient Active Problem List   Diagnosis Date Noted  . Age related osteoporosis 10/15/2018  . Ovarian failure 10/15/2018  . Screening for breast cancer 04/29/2018  . Essential hypertension 04/29/2018  . Obstructive chronic bronchitis without exacerbation ([place]) 04/29/2018  . MAI (mycobacterium avium-intracellulare) ([place]) 02/08/2018  . Numbness and tingling 03/25/2015  . Myopathy 02/19/2015  . Polyneuropathy 02/18/2015  . Weakness of both lower extremities 02/18/2015    1. Chronic obstructive pulmonary disease, unspecified COPD type ([place]) Continue to  use oxygen as directed.  Use Duoneb, and spiriva as directed.   2. MAI (mycobacterium avium-intracellulare) ([place]) Continue meds as directed.   3. Oxygen dependent Continue to use oxygen as directed.   4. Myopathy Continue to see cardiology.  5. SOB (shortness of breath) - Spirometry with Graph  General Counseling: I have discussed the findings of the evaluation and examination with Birdell.  I have also discussed any further diagnostic evaluation thatmay be needed or ordered today. Camiya verbalizes understanding of the findings of todays visit. We also reviewed her medications today and discussed drug interactions and side effects including but not limited excessive drowsiness and altered mental states. We also discussed that there is always a risk not just to her but also people around her. she has been encouraged to call the office with any questions or concerns that should arise related to todays visit.  Orders Placed  This Encounter  Procedures  . Spirometry with Graph    Order Specific Question:   Where should this test be performed?    Answer:   [facility]     Time spent: 25 This patient was seen by Orson Gear AGNP-C in Collaboration with Dr. Devona Konig as a part of collaborative care agreement.   I have personally obtained a history, examined the patient, evaluated laboratory and imaging results, formulated the assessment and plan and placed orders.    Allyne Gee, MD [facility] Pulmonary and Critical Care Sleep medicine

## 2020-02-13 ENCOUNTER — Other Ambulatory Visit: Payer: Self-pay | Admitting: Internal Medicine

## 2020-02-13 DIAGNOSIS — Z1231 Encounter for screening mammogram for malignant neoplasm of breast: Secondary | ICD-10-CM

## 2020-02-24 ENCOUNTER — Ambulatory Visit
Admission: RE | Admit: 2020-02-24 | Discharge: 2020-02-24 | Disposition: A | Discharge status: Home / Self Care | Payer: Medicare Other | Source: Ambulatory Visit | Attending: Internal Medicine | Admitting: Internal Medicine

## 2020-02-24 DIAGNOSIS — Z1231 Encounter for screening mammogram for malignant neoplasm of breast: Secondary | ICD-10-CM | POA: Diagnosis not present

## 2020-02-24 HISTORY — DX: Personal history of antineoplastic chemotherapy: Z92.21

## 2020-04-17 ENCOUNTER — Telehealth: Payer: Self-pay

## 2020-04-17 NOTE — Telephone Encounter (Signed)
Lmom to confirm and screen for 04-21-20 ov.

## 2020-04-21 ENCOUNTER — Telehealth: Payer: Self-pay

## 2020-04-21 ENCOUNTER — Ambulatory Visit (INDEPENDENT_AMBULATORY_CARE_PROVIDER_SITE_OTHER): Payer: Medicare Other | Admitting: Internal Medicine

## 2020-04-21 ENCOUNTER — Encounter: Payer: Self-pay | Admitting: Internal Medicine

## 2020-04-21 ENCOUNTER — Other Ambulatory Visit: Payer: Self-pay

## 2020-04-21 DIAGNOSIS — M810 Age-related osteoporosis without current pathological fracture: Secondary | ICD-10-CM

## 2020-04-21 DIAGNOSIS — R011 Cardiac murmur, unspecified: Secondary | ICD-10-CM | POA: Diagnosis not present

## 2020-04-21 DIAGNOSIS — J449 Chronic obstructive pulmonary disease, unspecified: Secondary | ICD-10-CM

## 2020-04-21 DIAGNOSIS — A310 Pulmonary mycobacterial infection: Secondary | ICD-10-CM | POA: Diagnosis not present

## 2020-04-21 NOTE — Telephone Encounter (Signed)
done

## 2020-04-21 NOTE — Progress Notes (Signed)
[facility] [place], [place] [address]  Internal MEDICINE  Office Visit Note  Patient Name: Breanna Christian  [id number]  [id number]  Date of Service: 04/24/2020  Chief Complaint  Patient presents with  . Follow-up  . COPD    MIA  . Osteoporosis    HPI Breanna Christian is here with her son for routine follow up. She does not speak english but able to communicate very well. She has been working in the garden since the summer started, did not travel to [place] last year and this year due to pandemic of Covid 27. She is fully vaccinated now, continues to take therapy for mycobacterium avium No recent flare ups. Was started on Boniva on since December for osteoporosis, she has been tolerating it well.  Current Medication: Outpatient Encounter Medications as of 04/21/2020  Medication Sig  . ARTIFICIAL TEAR OP Place 1 drop into both eyes every 6 (six) hours as needed (dry eyes). A product from [place].  Marland Kitchen azithromycin (ZITHROMAX) 250 MG tablet Take on tablet daily  . ethambutol (MYAMBUTOL) 400 MG tablet Take 1 tablet (400 mg total) by mouth daily.  Marland Kitchen ibandronate (BONIVA) 150 MG tablet TAKE 1 TABLET (150 MG TOTAL) BY MOUTH EVERY 30 (THIRTY) DAYS. TAKE IN THE MORNING WITH A FULL GLASS OF WATER, ON AN EMPTY STOMACH, AND DO NOT TAKE ANYTHING ELSE BY MOUTH OR LIE DOWN FOR THE NEXT 30 MIN.  Marland Kitchen ibuprofen (ADVIL,MOTRIN) 200 MG tablet Take 200 mg by mouth as needed.  . Multiple Vitamin (MULTI-VITAMINS) TABS Take 1 tablet by mouth daily. Reported on 04/25/2016  . Omega-3 Fatty Acids (OMEGA-3 FISH OIL) 300 MG CAPS Take by mouth daily.  . rifampin (RIFADIN) 150 MG capsule Take 1 capsule (150 mg total) by mouth daily.  Marland Kitchen SPIRIVA HANDIHALER 18 MCG inhalation capsule INHALE 1 CAPSULE VIA HANDIHALER ONCE DAILY AT THE SAME TIME EVERY DAY  . [DISCONTINUED] Vitamin D, Ergocalciferol, (DRISDOL) 50000 units CAPS capsule Take 50,000 Units by mouth every 7 (seven) days. Takes on Sundays    Facility-Administered Encounter Medications as of 04/21/2020  Medication  . ipratropium-albuterol (DUONEB) 0.5-2.5 (3) MG/3ML nebulizer solution 3 mL    Surgical History: Past Surgical History:  Procedure Laterality Date  . APPENDECTOMY    . BRONCHOSCOPY  02/2016  . CATARACT EXTRACTION W/PHACO Right 10/26/2016   Procedure: CATARACT EXTRACTION PHACO AND INTRAOCULAR LENS PLACEMENT (IOC);  Surgeon: Estill Cotta, MD;  Location: [facility] ORS;  Service: Ophthalmology;  Laterality: Right;  [place] 2.17 AP% 26.5 CDE 56.86 Fluid pack lot # [id number]  . FLEXIBLE BRONCHOSCOPY Bilateral 03/16/2016   Procedure: FLEXIBLE BRONCHOSCOPY;  Surgeon: Allyne Gee, MD;  Location: [facility] ORS;  Service: Pulmonary;  Laterality: Bilateral;  . FLEXIBLE BRONCHOSCOPY N/A 03/03/2017   Procedure: FLEXIBLE BRONCHOSCOPY;  Surgeon: Allyne Gee, MD;  Location: [facility] ORS;  Service: Pulmonary;  Laterality: N/A;  . TUBAL LIGATION      Medical History: Past Medical History:  Diagnosis Date  . Bronchiectasis ([place]) 2011  . COPD (chronic obstructive pulmonary disease) ([place])   . HOH (hard of hearing)   . Hypertension   . Lung nodules   . Mycobacterium avium complex colonization   . Non-Hodgkin lymphoma ([place]) 2003  . Personal history of chemotherapy   . Pneumonia    FREQUENT IN PAST    H/O MYCOBACTERIAL  . Rheumatoid arthritis ([place])   . Sjogren's syndrome ([place])   . Xerostomia     Family History: Family History  Problem Relation Age of Onset  . Breast cancer Neg Hx     Social History   Socioeconomic History  . Marital status: Married    Spouse name: Not on file  . Number of children: Not on file  . Years of education: Not on file  . Highest education level: Not on file  Occupational History  . Not on file  Tobacco Use  . Smoking status: Never Smoker  . Smokeless tobacco: Never Used  Substance and Sexual Activity  . Alcohol use: No  . Drug use: No  . Sexual activity: Not on file  Other Topics Concern  .  Not on file  Social History Narrative  . Not on file   Social Determinants of Health   Financial Resource Strain:   . Difficulty of Paying Living Expenses:   Food Insecurity:   . Worried About Charity fundraiser in the Last Year:   . Arboriculturist in the Last Year:   Transportation Needs:   . Film/video editor (Medical):   Marland Kitchen Lack of Transportation (Non-Medical):   Physical Activity:   . Days of Exercise per Week:   . Minutes of Exercise per Session:   Stress:   . Feeling of Stress :   Social Connections:   . Frequency of Communication with Friends and Family:   . Frequency of Social Gatherings with Friends and Family:   . Attends Religious Services:   . Active Member of Clubs or Organizations:   . Attends Archivist Meetings:   Marland Kitchen Marital Status:   Intimate Partner Violence:   . Fear of Current or Ex-Partner:   . Emotionally Abused:   Marland Kitchen Physically Abused:   . Sexually Abused:     Review of Systems  Constitutional: Negative for chills, diaphoresis and fatigue.  HENT: Negative for ear pain, postnasal drip and sinus pressure.   Eyes: Negative for photophobia, discharge, redness, itching and visual disturbance.  Respiratory: Negative for cough, shortness of breath and wheezing.   Cardiovascular: Negative for chest pain, palpitations and leg swelling.  Gastrointestinal: Negative for abdominal pain, constipation, diarrhea, nausea and vomiting.  Genitourinary: Negative for dysuria and flank pain.  Musculoskeletal: Negative for arthralgias, back pain, gait problem and neck pain.  Skin: Negative for color change.  Allergic/Immunologic: Negative for environmental allergies and food allergies.  Neurological: Negative for dizziness and headaches.  Hematological: Does not bruise/bleed easily.  Psychiatric/Behavioral: Negative for agitation, behavioral problems (depression) and hallucinations.    Vital Signs: BP 129/65   Pulse 62   Temp 98.5 F (36.9 C)   Resp  16   Ht _0  (1.549 m)   Wt 128 lb (58.1 kg)   SpO2 97%   BMI 24.19 kg/m    Physical Exam Constitutional:      General: She is not in acute distress.    Appearance: She is well-developed. She is not diaphoretic.  HENT:     Head: Normocephalic and atraumatic.     Mouth/Throat:     Pharynx: No oropharyngeal exudate.  Eyes:     Pupils: Pupils are equal, round, and reactive to light.  Neck:     Thyroid: No thyromegaly.     Vascular: No JVD.     Trachea: No tracheal deviation.  Cardiovascular:     Rate and Rhythm: Normal rate and regular rhythm.     Heart sounds: Murmur present. No friction rub. No gallop.   Pulmonary:     Effort: Pulmonary effort  is normal. No respiratory distress.     Breath sounds: No wheezing or rales.  Chest:     Chest wall: No tenderness.  Abdominal:     General: Bowel sounds are normal.     Palpations: Abdomen is soft.  Musculoskeletal:        General: Normal range of motion.     Cervical back: Normal range of motion and neck supple.  Lymphadenopathy:     Cervical: No cervical adenopathy.  Skin:    General: Skin is warm and dry.  Neurological:     Mental Status: She is alert and oriented to person, place, and time.     Cranial Nerves: No cranial nerve deficit.  Psychiatric:        Behavior: Behavior normal.        Thought Content: Thought content normal.        Judgment: Judgment normal.    Assessment/Plan: 1. Chronic obstructive pulmonary disease, unspecified COPD type ([place]) - Controlled and stable, continue Spiriva, will need PFT in future to monitor progression of her disease     2. Undiagnosed cardiac murmurs - New cardiac murmur, will get echo - ECHOCARDIOGRAM COMPLETE; Future  3. Pulmonary MAI (mycobacterium avium-intracellulare) infection (H[facility]) - Continue Azithromycin, ethambutol and Rifampin  4. Senile osteoporosis - Continue Boniva, calcium and Vit D  General Counseling: Meryle verbalizes understanding of the findings of  todays visit and agrees with plan of treatment. I have discussed any further diagnostic evaluation that may be needed or ordered today. We also reviewed her medications today. she has been encouraged to call the office with any questions or concerns that should arise related to todays visit.   Orders Placed This Encounter  Procedures  . ECHOCARDIOGRAM COMPLETE    Total time spent:30 Minutes Time spent includes review of chart, medications, test results, and follow up plan with the patient.    Dr Lavera Guise Internal medicine

## 2020-04-22 ENCOUNTER — Telehealth: Payer: Self-pay

## 2020-04-22 NOTE — Telephone Encounter (Signed)
Called pt son to notify him that I called [facility] billing and gave all the correct dx codes for lab work and the bill is on hold and will take up to 30-45 days to process.

## 2020-04-29 ENCOUNTER — Other Ambulatory Visit: Payer: Self-pay | Admitting: Adult Health

## 2020-04-29 DIAGNOSIS — J9611 Chronic respiratory failure with hypoxia: Secondary | ICD-10-CM

## 2020-04-29 DIAGNOSIS — J449 Chronic obstructive pulmonary disease, unspecified: Secondary | ICD-10-CM

## 2020-05-01 ENCOUNTER — Other Ambulatory Visit: Payer: Self-pay | Admitting: Internal Medicine

## 2020-05-01 DIAGNOSIS — A310 Pulmonary mycobacterial infection: Secondary | ICD-10-CM

## 2020-05-04 DIAGNOSIS — M3500 Sicca syndrome, unspecified: Secondary | ICD-10-CM | POA: Diagnosis not present

## 2020-05-13 ENCOUNTER — Telehealth: Payer: Self-pay

## 2020-05-13 NOTE — Telephone Encounter (Signed)
Confirmed ultrasound appt

## 2020-05-15 ENCOUNTER — Ambulatory Visit: Payer: Medicare Other

## 2020-05-15 ENCOUNTER — Other Ambulatory Visit: Payer: Self-pay

## 2020-05-15 DIAGNOSIS — R011 Cardiac murmur, unspecified: Secondary | ICD-10-CM | POA: Diagnosis not present

## 2020-05-19 ENCOUNTER — Telehealth: Payer: Self-pay

## 2020-05-19 NOTE — Telephone Encounter (Signed)
Confirmed appointment on 05/21/2020 and screened for covid. klh 

## 2020-05-21 ENCOUNTER — Ambulatory Visit (INDEPENDENT_AMBULATORY_CARE_PROVIDER_SITE_OTHER): Payer: Medicare Other | Admitting: Internal Medicine

## 2020-05-21 ENCOUNTER — Encounter: Payer: Self-pay | Admitting: Internal Medicine

## 2020-05-21 ENCOUNTER — Other Ambulatory Visit: Payer: Self-pay

## 2020-05-21 VITALS — BP 142/70 | HR 96 | Temp 97.4°F | Resp 16 | Ht 61.0 in | Wt 126.4 lb

## 2020-05-21 DIAGNOSIS — I10 Essential (primary) hypertension: Secondary | ICD-10-CM | POA: Diagnosis not present

## 2020-05-21 DIAGNOSIS — Z9981 Dependence on supplemental oxygen: Secondary | ICD-10-CM

## 2020-05-21 DIAGNOSIS — J449 Chronic obstructive pulmonary disease, unspecified: Secondary | ICD-10-CM | POA: Diagnosis not present

## 2020-05-21 DIAGNOSIS — A310 Pulmonary mycobacterial infection: Secondary | ICD-10-CM | POA: Diagnosis not present

## 2020-05-21 NOTE — Progress Notes (Signed)
[facility] [place], [place] [address]  Pulmonary Sleep Medicine   Office Visit Note  Patient Name: Breanna Christian DOB: Jan 26, 1941 MRN [id number]  Date of Service: 05/21/2020  Complaints/HPI: Pt is here for pulmonary follow up.  She reports overall she is doing well.  She continues to use oxygen intermittently.  She has some minor coughing but feels like this is at her baseline.   ROS  General: (-) fever, (-) chills, (-) night sweats, (-) weakness Skin: (-) rashes, (-) itching,. Eyes: (-) visual changes, (-) redness, (-) itching. Nose and Sinuses: (-) nasal stuffiness or itchiness, (-) postnasal drip, (-) nosebleeds, (-) sinus trouble. Mouth and Throat: (-) sore throat, (-) hoarseness. Neck: (-) swollen glands, (-) enlarged thyroid, (-) neck pain. Respiratory: + cough, (-) bloody sputum, - shortness of breath, - wheezing. Cardiovascular: - ankle swelling, (-) chest pain. Lymphatic: (-) lymph node enlargement. Neurologic: (-) numbness, (-) tingling. Psychiatric: (-) anxiety, (-) depression   Current Medication: Outpatient Encounter Medications as of 05/21/2020  Medication Sig  . ARTIFICIAL TEAR OP Place 1 drop into both eyes every 6 (six) hours as needed (dry eyes). A product from [place].  Marland Kitchen azithromycin (ZITHROMAX) 250 MG tablet TAKE 1 TABLET BY MOUTH EVERY DAY  . ethambutol (MYAMBUTOL) 400 MG tablet Take 1 tablet (400 mg total) by mouth daily.  Marland Kitchen ibandronate (BONIVA) 150 MG tablet TAKE 1 TABLET (150 MG TOTAL) BY MOUTH EVERY 30 (THIRTY) DAYS. TAKE IN THE MORNING WITH A FULL GLASS OF WATER, ON AN EMPTY STOMACH, AND DO NOT TAKE ANYTHING ELSE BY MOUTH OR LIE DOWN FOR THE NEXT 30 MIN.  Marland Kitchen ibuprofen (ADVIL,MOTRIN) 200 MG tablet Take 200 mg by mouth as needed.  . Multiple Vitamin (MULTI-VITAMINS) TABS Take 1 tablet by mouth daily. Reported on 04/25/2016  . Omega-3 Fatty Acids (OMEGA-3 FISH OIL) 300 MG CAPS Take by mouth daily.  . rifampin (RIFADIN) 150 MG  capsule Take 1 capsule (150 mg total) by mouth daily.  Marland Kitchen SPIRIVA HANDIHALER 18 MCG inhalation capsule INHALE 1 CAPSULE VIA HANDIHALER ONCE DAILY AT THE SAME TIME EVERY DAY   Facility-Administered Encounter Medications as of 05/21/2020  Medication  . ipratropium-albuterol (DUONEB) 0.5-2.5 (3) MG/3ML nebulizer solution 3 mL    Surgical History: Past Surgical History:  Procedure Laterality Date  . APPENDECTOMY    . BRONCHOSCOPY  02/2016  . CATARACT EXTRACTION W/PHACO Right 10/26/2016   Procedure: CATARACT EXTRACTION PHACO AND INTRAOCULAR LENS PLACEMENT (IOC);  Surgeon: Estill Cotta, MD;  Location: [facility] ORS;  Service: Ophthalmology;  Laterality: Right;  [place] 2.17 AP% 26.5 CDE 56.86 Fluid pack lot # [id number]  . FLEXIBLE BRONCHOSCOPY Bilateral 03/16/2016   Procedure: FLEXIBLE BRONCHOSCOPY;  Surgeon: Allyne Gee, MD;  Location: [facility] ORS;  Service: Pulmonary;  Laterality: Bilateral;  . FLEXIBLE BRONCHOSCOPY N/A 03/03/2017   Procedure: FLEXIBLE BRONCHOSCOPY;  Surgeon: Allyne Gee, MD;  Location: [facility] ORS;  Service: Pulmonary;  Laterality: N/A;  . TUBAL LIGATION      Medical History: Past Medical History:  Diagnosis Date  . Bronchiectasis ([place]) 2011  . COPD (chronic obstructive pulmonary disease) ([place])   . HOH (hard of hearing)   . Hypertension   . Lung nodules   . Mycobacterium avium complex colonization   . Non-Hodgkin lymphoma ([place]) 2003  . Personal history of chemotherapy   . Pneumonia    FREQUENT IN PAST    H/O MYCOBACTERIAL  . Rheumatoid arthritis ([place])   . Sjogren's syndrome ([place])   .  Xerostomia     Family History: Family History  Problem Relation Age of Onset  . Breast cancer Neg Hx     Social History: Social History   Socioeconomic History  . Marital status: Married    Spouse name: Not on file  . Number of children: Not on file  . Years of education: Not on file  . Highest education level: Not on file  Occupational History  . Not on file  Tobacco Use  .  Smoking status: Never Smoker  . Smokeless tobacco: Never Used  Vaping Use  . Vaping Use: Never used  Substance and Sexual Activity  . Alcohol use: No  . Drug use: No  . Sexual activity: Not on file  Other Topics Concern  . Not on file  Social History Narrative  . Not on file   Social Determinants of Health   Financial Resource Strain:   . Difficulty of Paying Living Expenses:   Food Insecurity:   . Worried About Charity fundraiser in the Last Year:   . Arboriculturist in the Last Year:   Transportation Needs:   . Film/video editor (Medical):   Marland Kitchen Lack of Transportation (Non-Medical):   Physical Activity:   . Days of Exercise per Week:   . Minutes of Exercise per Session:   Stress:   . Feeling of Stress :   Social Connections:   . Frequency of Communication with Friends and Family:   . Frequency of Social Gatherings with Friends and Family:   . Attends Religious Services:   . Active Member of Clubs or Organizations:   . Attends Archivist Meetings:   Marland Kitchen Marital Status:   Intimate Partner Violence:   . Fear of Current or Ex-Partner:   . Emotionally Abused:   Marland Kitchen Physically Abused:   . Sexually Abused:     Vital Signs: Blood pressure (!) 142/70, pulse 96, temperature (!) 97.4 F (36.3 C), resp. rate 16, height _0  (1.549 m), weight 126 lb 6.4 oz (57.3 kg), SpO2 94 %.  Examination: General Appearance: The patient is well-developed, well-nourished, and in no distress. Skin: Gross inspection of skin unremarkable. Head: normocephalic, no gross deformities. Eyes: no gross deformities noted. ENT: ears appear grossly normal no exudates. Neck: Supple. No thyromegaly. No LAD. Respiratory: faint wheezing noted bilaterally.  Cardiovascular: Normal S1 and S2 without murmur or rub. Extremities: No cyanosis. pulses are equal. Neurologic: Alert and oriented. No involuntary movements.  LABS: No results found for this or any previous visit (from the past 2160  hour(s)).  Radiology: MM 3D SCREEN BREAST BILATERAL  Result Date: 02/24/2020 CLINICAL DATA:  Screening. EXAM: DIGITAL SCREENING BILATERAL MAMMOGRAM WITH TOMO AND CAD COMPARISON:  Previous exam(s). ACR Breast Density Category b: There are scattered areas of fibroglandular density. FINDINGS: There are no findings suspicious for malignancy. Images were processed with CAD. IMPRESSION: No mammographic evidence of malignancy. A result letter of this screening mammogram will be mailed directly to the patient. RECOMMENDATION: Screening mammogram in one year. (Code:SM-B-01Y) BI-RADS CATEGORY  1: Negative. Electronically Signed   By: Lillia Mountain M.D.   On: 02/24/2020 13:10    No results found.  No results found.    Assessment and Plan: Patient Active Problem List   Diagnosis Date Noted  . Age related osteoporosis 10/15/2018  . Ovarian failure 10/15/2018  . Screening for breast cancer 04/29/2018  . Essential hypertension 04/29/2018  . Obstructive chronic bronchitis without exacerbation ([place]) 04/29/2018  .  MAI (mycobacterium avium-intracellulare) ([place]) 02/08/2018  . Numbness and tingling 03/25/2015  . Myopathy 02/19/2015  . Polyneuropathy 02/18/2015  . Weakness of both lower extremities 02/18/2015    1. Chronic obstructive pulmonary disease, unspecified COPD type ([place]) Stable, continue to use oxygen.    2. Pulmonary MAI (mycobacterium avium-intracellulare) infection (H[facility]) Continue Azithromycin as prescribed.   3. Oxygen dependent Continue to use oxygen 2-3 lpm as discussed.   4. Essential hypertension Stable, continue present management.   General Counseling: I have discussed the findings of the evaluation and examination with Adanely.  I have also discussed any further diagnostic evaluation thatmay be needed or ordered today. Klaira verbalizes understanding of the findings of todays visit. We also reviewed her medications today and discussed drug interactions and side effects  including but not limited excessive drowsiness and altered mental states. We also discussed that there is always a risk not just to her but also people around her. she has been encouraged to call the office with any questions or concerns that should arise related to todays visit.  No orders of the defined types were placed in this encounter.    Time spent: 30 This patient was seen by Orson Gear AGNP-C in Collaboration with Dr. Devona Konig as a part of collaborative care agreement.   I have personally obtained a history, examined the patient, evaluated laboratory and imaging results, formulated the assessment and plan and placed orders.    Allyne Gee, MD [facility] Pulmonary and Critical Care Sleep medicine

## 2020-06-02 ENCOUNTER — Other Ambulatory Visit: Payer: Self-pay | Admitting: Internal Medicine

## 2020-07-21 ENCOUNTER — Telehealth: Payer: Self-pay

## 2020-07-21 NOTE — Telephone Encounter (Signed)
Lmom to confirm and screen for 07-23-20 ov.

## 2020-07-23 ENCOUNTER — Other Ambulatory Visit: Payer: Self-pay

## 2020-07-23 ENCOUNTER — Ambulatory Visit (INDEPENDENT_AMBULATORY_CARE_PROVIDER_SITE_OTHER): Payer: Medicare Other | Admitting: Nurse Practitioner

## 2020-07-23 VITALS — BP 129/75 | HR 95 | Temp 97.8°F | Resp 16 | Ht 61.0 in | Wt 124.8 lb

## 2020-07-23 DIAGNOSIS — Z23 Encounter for immunization: Secondary | ICD-10-CM | POA: Diagnosis not present

## 2020-07-23 DIAGNOSIS — R300 Dysuria: Secondary | ICD-10-CM | POA: Diagnosis not present

## 2020-07-23 DIAGNOSIS — R5383 Other fatigue: Secondary | ICD-10-CM

## 2020-07-23 DIAGNOSIS — Z0001 Encounter for general adult medical examination with abnormal findings: Secondary | ICD-10-CM

## 2020-07-23 DIAGNOSIS — J449 Chronic obstructive pulmonary disease, unspecified: Secondary | ICD-10-CM | POA: Diagnosis not present

## 2020-07-23 DIAGNOSIS — M810 Age-related osteoporosis without current pathological fracture: Secondary | ICD-10-CM | POA: Diagnosis not present

## 2020-07-23 MED ORDER — PNEUMOCOCCAL 13-VAL CONJ VACC IM SUSP: 0.5000 mL | Freq: Once | INTRAMUSCULAR | 0 refills | Status: AC

## 2020-07-23 NOTE — Progress Notes (Signed)
[facility] [place], [place] [address]  Internal MEDICINE  Office Visit Note  Patient Name: Breanna Christian  [id number]  [id number]  Date of Service: 08/10/2020   Pt is here for routine health maintenance examination   Chief Complaint  Patient presents with  . Medicare Wellness  . Hypertension  . Cough    in the morning  . Quality Metric Gaps    tetnaus, flu vaccine,hep C     The patient is here for health maintenance exam. She had screening mammogram 02/24/2020 and was negative. She is due to have routine, fasting labs. She states that she has cough in the morning. This is sometimes productive of yellow phlegm. She does have history of COPD and this symptom has been resent for some time. It is not worse. Using her inhlaers and respiratory medications improves the symptoms. She routinely sees pulmonology. Her blood pressure is well controlled. She has no new concerns or complaints today.    Current Medication: Outpatient Encounter Medications as of 07/23/2020  Medication Sig  . ARTIFICIAL TEAR OP Place 1 drop into both eyes every 6 (six) hours as needed (dry eyes). A product from [place].  Marland Kitchen azithromycin (ZITHROMAX) 250 MG tablet TAKE 1 TABLET BY MOUTH EVERY DAY  . ethambutol (MYAMBUTOL) 400 MG tablet Take 1 tablet (400 mg total) by mouth daily.  Marland Kitchen ibandronate (BONIVA) 150 MG tablet TAKE 1 TABLET (150 MG TOTAL) BY MOUTH EVERY 30 (THIRTY) DAYS. TAKE IN THE MORNING WITH A FULL GLASS OF WATER, ON AN EMPTY STOMACH, AND DO NOT TAKE ANYTHING ELSE BY MOUTH OR LIE DOWN FOR THE NEXT 30 MIN.  Marland Kitchen ibuprofen (ADVIL,MOTRIN) 200 MG tablet Take 200 mg by mouth as needed.  . Lifitegrast 5 % SOLN Apply 1 drop to eye in the morning and at bedtime. 1 Gtt both eyes twice a day  . Multiple Vitamin (MULTI-VITAMINS) TABS Take 1 tablet by mouth daily. Reported on 04/25/2016  . Omega-3 Fatty Acids (OMEGA-3 FISH OIL) 300 MG CAPS Take by mouth daily.  Marland Kitchen PROAIR HFA 108 (90 Base)  MCG/ACT inhaler INHALE 2 PUFFS EVERY 6 HOURS AS NEEDED  . rifampin (RIFADIN) 150 MG capsule Take 1 capsule (150 mg total) by mouth daily.  Marland Kitchen SPIRIVA HANDIHALER 18 MCG inhalation capsule INHALE 1 CAPSULE VIA HANDIHALER ONCE DAILY AT THE SAME TIME EVERY DAY  . [EXPIRED] pneumococcal 13-valent conjugate vaccine (PREVNAR 13) SUSP injection Inject 0.5 mLs into the muscle once for 1 dose.   Facility-Administered Encounter Medications as of 07/23/2020  Medication  . ipratropium-albuterol (DUONEB) 0.5-2.5 (3) MG/3ML nebulizer solution 3 mL    Surgical History: Past Surgical History:  Procedure Laterality Date  . APPENDECTOMY    . BRONCHOSCOPY  02/2016  . CATARACT EXTRACTION W/PHACO Right 10/26/2016   Procedure: CATARACT EXTRACTION PHACO AND INTRAOCULAR LENS PLACEMENT (IOC);  Surgeon: Estill Cotta, MD;  Location: [facility] ORS;  Service: Ophthalmology;  Laterality: Right;  [place] 2.17 AP% 26.5 CDE 56.86 Fluid pack lot # [id number]  . FLEXIBLE BRONCHOSCOPY Bilateral 03/16/2016   Procedure: FLEXIBLE BRONCHOSCOPY;  Surgeon: Allyne Gee, MD;  Location: [facility] ORS;  Service: Pulmonary;  Laterality: Bilateral;  . FLEXIBLE BRONCHOSCOPY N/A 03/03/2017   Procedure: FLEXIBLE BRONCHOSCOPY;  Surgeon: Allyne Gee, MD;  Location: [facility] ORS;  Service: Pulmonary;  Laterality: N/A;  . TUBAL LIGATION      Medical History: Past Medical History:  Diagnosis Date  . Bronchiectasis ([place]) 2011  . COPD (chronic obstructive pulmonary disease) ([place])   .  HOH (hard of hearing)   . Hypertension   . Lung nodules   . Mycobacterium avium complex colonization   . Non-Hodgkin lymphoma ([place]) 2003  . Personal history of chemotherapy   . Pneumonia    FREQUENT IN PAST    H/O MYCOBACTERIAL  . Rheumatoid arthritis ([place])   . Sjogren's syndrome ([place])   . Xerostomia     Family History: Family History  Problem Relation Age of Onset  . Breast cancer Neg Hx       Review of Systems  Constitutional: Negative for chills, fatigue  and unexpected weight change.  HENT: Positive for congestion. Negative for postnasal drip, rhinorrhea, sneezing and sore throat.   Respiratory: Positive for cough, shortness of breath and wheezing. Negative for chest tightness.        Well controlled with current respiratory medications.   Cardiovascular: Negative for chest pain and palpitations.  Gastrointestinal: Negative for abdominal pain, constipation, diarrhea, nausea and vomiting.  Endocrine: Negative for cold intolerance, heat intolerance, polydipsia and polyuria.  Genitourinary: Negative for dysuria and frequency.  Musculoskeletal: Negative for arthralgias, back pain, joint swelling and neck pain.  Skin: Negative for rash.  Allergic/Immunologic: Positive for environmental allergies.  Neurological: Negative for dizziness, tremors, numbness and headaches.  Hematological: Negative for adenopathy. Does not bruise/bleed easily.  Psychiatric/Behavioral: Negative for behavioral problems (Depression), sleep disturbance and suicidal ideas. The patient is not nervous/anxious.      Today's Vitals   07/23/20 1005  BP: 129/75  Pulse: 95  Resp: 16  Temp: 97.8 F (36.6 C)  SpO2: 97%  Weight: 124 lb 12.8 oz (56.6 kg)  Height: 5' 1" (1.549 m)   Body mass index is 23.58 kg/m.  Physical Exam Vitals and nursing note reviewed.  Constitutional:      General: She is not in acute distress.    Appearance: Normal appearance. She is well-developed. She is not diaphoretic.  HENT:     Head: Normocephalic and atraumatic.     Nose: Nose normal.     Mouth/Throat:     Pharynx: No oropharyngeal exudate.  Eyes:     Pupils: Pupils are equal, round, and reactive to light.  Neck:     Thyroid: No thyromegaly.     Vascular: No carotid bruit or JVD.     Trachea: No tracheal deviation.  Cardiovascular:     Rate and Rhythm: Normal rate and regular rhythm.     Pulses: Normal pulses.     Heart sounds: Normal heart sounds. No murmur heard.  No friction  rub. No gallop.   Pulmonary:     Effort: Pulmonary effort is normal. No respiratory distress.     Breath sounds: No wheezing or rales.     Comments: Scant expiratory wheezes heard in bilateral lung bases.  Chest:     Chest wall: No tenderness.  Abdominal:     General: Bowel sounds are normal.     Palpations: Abdomen is soft.     Tenderness: There is no abdominal tenderness.  Musculoskeletal:        General: Normal range of motion.     Cervical back: Normal range of motion and neck supple.  Lymphadenopathy:     Cervical: No cervical adenopathy.  Skin:    General: Skin is warm and dry.  Neurological:     General: No focal deficit present.     Mental Status: She is alert and oriented to person, place, and time.     Cranial Nerves: No cranial nerve  deficit.  Psychiatric:        Mood and Affect: Mood normal.        Behavior: Behavior normal.        Thought Content: Thought content normal.        Judgment: Judgment normal.    Depression screen [facility] 2/9 07/23/2020 12/24/2019 07/23/2019 10/15/2018 07/17/2018  Decreased Interest 0 0 0 0 0  Down, Depressed, Hopeless 0 0 0 0 0  PHQ - 2 Score 0 0 0 0 0  Altered sleeping - - - - -  Tired, decreased energy - - - - -  Change in appetite - - - - -  Feeling bad or failure about yourself  - - - - -  Trouble concentrating - - - - -  Moving slowly or fidgety/restless - - - - -  Suicidal thoughts - - - - -  PHQ-9 Score - - - - -  Difficult doing work/chores - - - - -    Functional Status Survey: Is the patient deaf or have difficulty hearing?: Yes Does the patient have difficulty seeing, even when wearing glasses/contacts?: Yes Does the patient have difficulty concentrating, remembering, or making decisions?: No Does the patient have difficulty walking or climbing stairs?: No Does the patient have difficulty dressing or bathing?: No Does the patient have difficulty doing errands alone such as visiting a doctor's office or shopping?: No  MMSE -  [place] Exam 07/23/2020  Orientation to time 5  Orientation to Place 5  Registration 3  Attention/ Calculation 5  Recall 3  Language- name 2 objects 2  Language- repeat 1  Language- follow 3 step command 3  Language- read & follow direction 1  Write a sentence 1  Copy design 1  Total score 30    Fall Risk  07/23/2020 12/24/2019 07/23/2019 10/15/2018 07/17/2018  Falls in the past year? 0 0 0 0 No      LABS: Recent Results (from the past 2160 hour(s))  UA/M w/rflx Culture, Routine     Status: Abnormal   Collection Time: 07/23/20 10:38 AM   Specimen: Urine   Urine  Result Value Ref Range   Specific Gravity, UA 1.025 1.005 - 1.030   pH, UA 5.5 5.0 - 7.5   Color, UA Yellow Yellow   Appearance Ur Turbid (A) Clear   Leukocytes,UA Negative Negative   Protein,UA Negative Negative/Trace   Glucose, UA Negative Negative   Ketones, UA Negative Negative   RBC, UA Negative Negative   Bilirubin, UA Negative Negative   Urobilinogen, Ur 0.2 0.2 - 1.0 mg/dL   Nitrite, UA Negative Negative   Microscopic Examination Comment     Comment: Microscopic follows if indicated.   Microscopic Examination See below:     Comment: Microscopic was indicated and was performed.   Urinalysis Reflex Comment     Comment: This specimen will not reflex to a Urine Culture.  Microscopic Examination     Status: None   Collection Time: 07/23/20 10:38 AM   Urine  Result Value Ref Range   WBC, UA 0-5 0 - 5 /hpf   RBC 0-2 0 - 2 /hpf   Epithelial Cells (non renal) 0-10 0 - 10 /hpf   Casts None seen None seen /lpf   Bacteria, UA None seen None seen/Few  Comprehensive metabolic panel     Status: Abnormal   Collection Time: 07/31/20 11:09 AM  Result Value Ref Range   Glucose 111 (H) 65 - 99 mg/dL  BUN 11 8 - 27 mg/dL   Creatinine, Ser 0.72 0.57 - 1.00 mg/dL   GFR calc non Af Amer 80 >59 mL/min/1.73   GFR calc Af Amer 93 >59 mL/min/1.73    Comment: **[facility] currently reports eGFR in compliance with the  current**   recommendations of the [employer]. [facility] will   update reporting as new guidelines are published from the NKF-ASN   Task force.    BUN/Creatinine Ratio 15 12 - 28   Sodium 145 (H) 134 - 144 mmol/L   Potassium 5.7 (H) 3.5 - 5.2 mmol/L   Chloride 101 96 - 106 mmol/L   CO2 28 20 - 29 mmol/L   Calcium 10.1 8.7 - 10.3 mg/dL   Total Protein 7.3 6.0 - 8.5 g/dL   Albumin 4.5 3.7 - 4.7 g/dL   Globulin, Total 2.8 1.5 - 4.5 g/dL   Albumin/Globulin Ratio 1.6 1.2 - 2.2   Bilirubin Total 0.5 0.0 - 1.2 mg/dL   Alkaline Phosphatase 85 48 - 121 IU/L    Comment: **Effective August 03, 2020 Alkaline Phosphatase**   reference interval will be changing to:              Age                Female          Female           0 -  5 days         15 - 127       85 - 127           6 - 10 days         62 - 242       81 - 34          11 - 20 days        109 - 357      109 - 357          21 - 30 days         94 - 818       29 - 494           1 -  2 months      149 - 539      149 - 539           3 -  6 months      131 - 452      131 - 452           7 - 11 months      117 - 401      117 - 401   12 months -  6 years       158 - 369      158 - 369           7 - 12 years       150 - 409      150 - 409               13 years       63 - 435       17 - 227               14 years       71 - 375       38 - 29               15 years  88 - 279       56 - 134               16 years        36 - 207       82 - 121               17 years        73 - 161       7 - 113          46 - 20 years        46 - 125       58 - 106              >20 years         62 - 121       32 - 121    AST 20 0 - 40 IU/L   ALT 12 0 - 32 IU/L  CBC     Status: None   Collection Time: 07/31/20 11:09 AM  Result Value Ref Range   WBC 7.0 3.4 - 10.8 x10E3/uL   RBC 4.86 3.77 - 5.28 x10E6/uL   Hemoglobin 13.8 11.1 - 15.9 g/dL   Hematocrit 40.6 34.0 - 46.6 %   MCV 84 79 - 97 fL   MCH 28.4 26.6 - 33.0 pg    MCHC 34.0 31 - 35 g/dL   RDW 12.2 11.7 - 15.4 %   Platelets 312 150 - 450 x10E3/uL  Lipid Panel With LDL/HDL Ratio     Status: Abnormal   Collection Time: 07/31/20 11:09 AM  Result Value Ref Range   Cholesterol, Total 228 (H) 100 - 199 mg/dL   Triglycerides 112 0 - 149 mg/dL   HDL 74 >39 mg/dL   VLDL Cholesterol Cal 20 5 - 40 mg/dL   LDL Chol Calc (NIH) 134 (H) 0 - 99 mg/dL   LDL/HDL Ratio 1.8 0.0 - 3.2 ratio    Comment:                                     LDL/HDL Ratio                                             Men  Women                               1/2 Avg.Risk  1.0    1.5                                   Avg.Risk  3.6    3.2                                2X Avg.Risk  6.2    5.0                                3X Avg.Risk  8.0    6.1   T4, free     Status: None   Collection Time: 07/31/20 11:09  AM  Result Value Ref Range   Free T4 1.19 0.82 - 1.77 ng/dL  TSH     Status: None   Collection Time: 07/31/20 11:09 AM  Result Value Ref Range   TSH 0.808 0.450 - 4.500 uIU/mL  VITAMIN D 25 Hydroxy (Vit-D Deficiency, Fractures)     Status: None   Collection Time: 07/31/20 11:09 AM  Result Value Ref Range   Vit D, 25-Hydroxy 47.3 30.0 - 100.0 ng/mL    Comment: Vitamin D deficiency has been defined by the [place] practice guideline as a level of serum 25-OH vitamin D less than 20 ng/mL (1,2). The Endocrine Society went on to further define vitamin D insufficiency as a level between 21 and 29 ng/mL (2). 1. IOM (Institute of Medicine). 2010. Dietary reference    intakes for calcium and D. [place]: The    [employer]. 2. Holick MF, Binkley [place], Bischoff-Ferrari HA, et al.    Evaluation, treatment, and prevention of vitamin D    deficiency: an Endocrine Society clinical practice    guideline. JCEM. 2011 Jul; 96(7):1911-30.      Assessment/Plan: 1. Encounter for general adult medical examination with abnormal  findings Annual health maintenance exam today. Order slip given to have routine, fasting labs drawn.  2. Obstructive chronic bronchitis without exacerbation (HCC) Stable. Continue using inhalers as prescribed   3. Other fatigue Check labs for further evaluation.   4. Age-related osteoporosis without current pathological fracture Continue boniva once monthly  5. Need for vaccination against Streptococcus pneumoniae using pneumococcal conjugate vaccine 7 Prescription for prevnar 13 sent to her pharmacy for administration.  - pneumococcal 13-valent conjugate vaccine (PREVNAR 13) SUSP injection; Inject 0.5 mLs into the muscle once for 1 dose.  Dispense: 0.5 mL; Refill: 0  6. Dysuria - UA/M w/rflx Culture, Routine  General Counseling: Kameshia verbalizes understanding of the findings of todays visit and agrees with plan of treatment. I have discussed any further diagnostic evaluation that may be needed or ordered today. We also reviewed her medications today. she has been encouraged to call the office with any questions or concerns that should arise related to todays visit.    Counseling:  This patient was seen by Leretha Pol FNP Collaboration with Dr Lavera Guise as a part of collaborative care agreement  Orders Placed This Encounter  Procedures  . Microscopic Examination  . UA/M w/rflx Culture, Routine    Meds ordered this encounter  Medications  . pneumococcal 13-valent conjugate vaccine (PREVNAR 13) SUSP injection    Sig: Inject 0.5 mLs into the muscle once for 1 dose.    Dispense:  0.5 mL    Refill:  0    Order Specific Question:   Supervising Provider    Answer:   Lavera Guise [[id number]]    Total time spent: 18 Minutes  Time spent includes review of chart, medications, test results, and follow up plan with the patient.     Lavera Guise, MD  Internal Medicine

## 2020-07-24 LAB — UA/M W/RFLX CULTURE, ROUTINE
Bilirubin, UA: NEGATIVE
Glucose, UA: NEGATIVE
Ketones, UA: NEGATIVE
Leukocytes,UA: NEGATIVE
Nitrite, UA: NEGATIVE
Protein,UA: NEGATIVE
RBC, UA: NEGATIVE
Specific Gravity, UA: 1.025 (ref 1.005–1.030)
Urobilinogen, Ur: 0.2 mg/dL (ref 0.2–1.0)
pH, UA: 5.5 (ref 5.0–7.5)

## 2020-07-24 LAB — MICROSCOPIC EXAMINATION
Bacteria, UA: NONE SEEN
Casts: NONE SEEN /lpf

## 2020-07-31 ENCOUNTER — Other Ambulatory Visit: Payer: Self-pay | Admitting: Nurse Practitioner

## 2020-07-31 DIAGNOSIS — R5383 Other fatigue: Secondary | ICD-10-CM | POA: Diagnosis not present

## 2020-07-31 DIAGNOSIS — Z0001 Encounter for general adult medical examination with abnormal findings: Secondary | ICD-10-CM | POA: Diagnosis not present

## 2020-07-31 DIAGNOSIS — E559 Vitamin D deficiency, unspecified: Secondary | ICD-10-CM | POA: Diagnosis not present

## 2020-08-01 LAB — COMPREHENSIVE METABOLIC PANEL
ALT: 12 IU/L (ref 0–32)
AST: 20 IU/L (ref 0–40)
Albumin/Globulin Ratio: 1.6 (ref 1.2–2.2)
Albumin: 4.5 g/dL (ref 3.7–4.7)
Alkaline Phosphatase: 85 IU/L (ref 48–121)
BUN/Creatinine Ratio: 15 (ref 12–28)
BUN: 11 mg/dL (ref 8–27)
Bilirubin Total: 0.5 mg/dL (ref 0.0–1.2)
CO2: 28 mmol/L (ref 20–29)
Calcium: 10.1 mg/dL (ref 8.7–10.3)
Chloride: 101 mmol/L (ref 96–106)
Creatinine, Ser: 0.72 mg/dL (ref 0.57–1.00)
GFR calc Af Amer: 93 mL/min/{1.73_m2} (ref >59)
GFR calc non Af Amer: 80 mL/min/{1.73_m2} (ref >59)
Globulin, Total: 2.8 g/dL (ref 1.5–4.5)
Glucose: 111 mg/dL — ABNORMAL HIGH (ref 65–99)
Potassium: 5.7 mmol/L — ABNORMAL HIGH (ref 3.5–5.2)
Sodium: 145 mmol/L — ABNORMAL HIGH (ref 134–144)
Total Protein: 7.3 g/dL (ref 6.0–8.5)

## 2020-08-01 LAB — LIPID PANEL WITH LDL/HDL RATIO
Cholesterol, Total: 228 mg/dL — ABNORMAL HIGH (ref 100–199)
HDL: 74 mg/dL (ref >39)
LDL Chol Calc (NIH): 134 mg/dL — ABNORMAL HIGH (ref 0–99)
LDL/HDL Ratio: 1.8 ratio (ref 0.0–3.2)
Triglycerides: 112 mg/dL (ref 0–149)
VLDL Cholesterol Cal: 20 mg/dL (ref 5–40)

## 2020-08-01 LAB — CBC
Hematocrit: 40.6 % (ref 34.0–46.6)
Hemoglobin: 13.8 g/dL (ref 11.1–15.9)
MCH: 28.4 pg (ref 26.6–33.0)
MCHC: 34 g/dL (ref 31.5–35.7)
MCV: 84 fL (ref 79–97)
Platelets: 312 10*3/uL (ref 150–450)
RBC: 4.86 x10E6/uL (ref 3.77–5.28)
RDW: 12.2 % (ref 11.7–15.4)
WBC: 7 10*3/uL (ref 3.4–10.8)

## 2020-08-01 LAB — TSH: TSH: 0.808 u[IU]/mL (ref 0.450–4.500)

## 2020-08-01 LAB — T4, FREE: Free T4: 1.19 ng/dL (ref 0.82–1.77)

## 2020-08-01 LAB — VITAMIN D 25 HYDROXY (VIT D DEFICIENCY, FRACTURES): Vit D, 25-Hydroxy: 47.3 ng/mL (ref 30.0–100.0)

## 2020-08-08 DIAGNOSIS — Z23 Encounter for immunization: Secondary | ICD-10-CM | POA: Insufficient documentation

## 2020-08-08 DIAGNOSIS — Z0001 Encounter for general adult medical examination with abnormal findings: Secondary | ICD-10-CM | POA: Insufficient documentation

## 2020-08-08 DIAGNOSIS — R5383 Other fatigue: Secondary | ICD-10-CM | POA: Insufficient documentation

## 2020-08-11 ENCOUNTER — Ambulatory Visit: Payer: PRIVATE HEALTH INSURANCE | Admitting: Internal Medicine

## 2020-08-12 ENCOUNTER — Other Ambulatory Visit: Payer: Self-pay | Admitting: Internal Medicine

## 2020-08-12 ENCOUNTER — Telehealth: Payer: Self-pay

## 2020-08-12 DIAGNOSIS — E875 Hyperkalemia: Secondary | ICD-10-CM

## 2020-08-12 NOTE — Telephone Encounter (Signed)
Called and spoke to pt's son Breanna Christian and informed him that pt's potassium was a little high and that we have a order placed for her to get it repeated.  Breanna Christian informed me that it may be next week sometime before pt can get labs done. dbs

## 2020-08-12 NOTE — Telephone Encounter (Signed)
-----   Message from Lavera Guise, MD sent at 08/12/2020 12:26 PM EDT ----- Kris Hartmann can u call her daughter to tell that her potassium is a little high, sometimes it can be lab errot, we just need to repeat it. I put an order for [facility]

## 2020-08-28 DIAGNOSIS — E875 Hyperkalemia: Secondary | ICD-10-CM | POA: Diagnosis not present

## 2020-08-29 LAB — BASIC METABOLIC PANEL
BUN/Creatinine Ratio: 19 (ref 12–28)
BUN: 10 mg/dL (ref 8–27)
CO2: 27 mmol/L (ref 20–29)
Calcium: 9.4 mg/dL (ref 8.7–10.3)
Chloride: 101 mmol/L (ref 96–106)
Creatinine, Ser: 0.53 mg/dL — ABNORMAL LOW (ref 0.57–1.00)
GFR calc Af Amer: 105 mL/min/{1.73_m2} (ref >59)
GFR calc non Af Amer: 91 mL/min/{1.73_m2} (ref >59)
Glucose: 89 mg/dL (ref 65–99)
Potassium: 5.6 mmol/L — ABNORMAL HIGH (ref 3.5–5.2)
Sodium: 141 mmol/L (ref 134–144)

## 2020-09-03 DIAGNOSIS — Z23 Encounter for immunization: Secondary | ICD-10-CM | POA: Diagnosis not present

## 2020-09-26 DIAGNOSIS — Z23 Encounter for immunization: Secondary | ICD-10-CM | POA: Diagnosis not present

## 2020-11-17 ENCOUNTER — Other Ambulatory Visit: Payer: Self-pay | Admitting: Internal Medicine

## 2020-11-17 DIAGNOSIS — A310 Pulmonary mycobacterial infection: Secondary | ICD-10-CM

## 2020-11-30 ENCOUNTER — Other Ambulatory Visit: Payer: Self-pay

## 2020-11-30 ENCOUNTER — Encounter: Payer: Self-pay | Admitting: Internal Medicine

## 2020-11-30 ENCOUNTER — Ambulatory Visit: Payer: Medicare Other | Admitting: Internal Medicine

## 2020-11-30 VITALS — BP 109/86 | HR 99 | Temp 97.5°F | Resp 16 | Ht 61.0 in | Wt 123.0 lb

## 2020-11-30 DIAGNOSIS — R0602 Shortness of breath: Secondary | ICD-10-CM

## 2020-11-30 DIAGNOSIS — I739 Peripheral vascular disease, unspecified: Secondary | ICD-10-CM

## 2020-11-30 DIAGNOSIS — M25471 Effusion, right ankle: Secondary | ICD-10-CM

## 2020-11-30 DIAGNOSIS — J479 Bronchiectasis, uncomplicated: Secondary | ICD-10-CM

## 2020-11-30 DIAGNOSIS — Z79899 Other long term (current) drug therapy: Secondary | ICD-10-CM

## 2020-11-30 DIAGNOSIS — M810 Age-related osteoporosis without current pathological fracture: Secondary | ICD-10-CM

## 2020-11-30 DIAGNOSIS — I824Z3 Acute embolism and thrombosis of unspecified deep veins of distal lower extremity, bilateral: Secondary | ICD-10-CM

## 2020-11-30 DIAGNOSIS — A310 Pulmonary mycobacterial infection: Secondary | ICD-10-CM

## 2020-11-30 MED ORDER — IBANDRONATE SODIUM 150 MG PO TABS: 150.0000 mg | ORAL_TABLET | ORAL | 3 refills | Status: DC

## 2020-11-30 MED ORDER — SODIUM CHLORIDE 3 % IN NEBU: INHALATION_SOLUTION | RESPIRATORY_TRACT | 12 refills | Status: DC | PRN

## 2020-11-30 MED ORDER — CLOTRIMAZOLE-BETAMETHASONE 1-0.05 % EX CREA: 1.0000 "application " | TOPICAL_CREAM | Freq: Two times a day (BID) | CUTANEOUS | 0 refills | Status: DC

## 2020-11-30 NOTE — Patient Instructions (Signed)

## 2020-11-30 NOTE — Progress Notes (Unsigned)
[facility] [place], [place] [address]  Pulmonary Sleep Medicine   Office Visit Note  Patient Name: Breanna Christian DOB: 1941/06/01 MRN [id number]  Date of Service: 11/30/2020  Complaints/HPI: Breathng good. There is some vascular changes noted with blue discoloration of the legs. She states this is new. She notes some itching. She has no sense loss. Not a diabetic  ROS  General: (-) fever, (-) chills, (-) night sweats, (-) weakness Skin: (-) rashes, (-) itching,. Eyes: (-) visual changes, (-) redness, (-) itching. Nose and Sinuses: (-) nasal stuffiness or itchiness, (-) postnasal drip, (-) nosebleeds, (-) sinus trouble. Mouth and Throat: (-) sore throat, (-) hoarseness. Neck: (-) swollen glands, (-) enlarged thyroid, (-) neck pain. Respiratory: - cough, (-) bloody sputum, + shortness of breath, - wheezing. Cardiovascular: - ankle swelling, (-) chest pain. Lymphatic: (-) lymph node enlargement. Neurologic: (-) numbness, (-) tingling. Psychiatric: (-) anxiety, (-) depression   Current Medication: Outpatient Encounter Medications as of 11/30/2020  Medication Sig  . ARTIFICIAL TEAR OP Place 1 drop into both eyes every 6 (six) hours as needed (dry eyes). A product from [place].  Marland Kitchen ethambutol (MYAMBUTOL) 400 MG tablet TAKE 1 TABLET BY MOUTH EVERY DAY  . ibandronate (BONIVA) 150 MG tablet TAKE 1 TABLET (150 MG TOTAL) BY MOUTH EVERY 30 (THIRTY) DAYS. TAKE IN THE MORNING WITH A FULL GLASS OF WATER, ON AN EMPTY STOMACH, AND DO NOT TAKE ANYTHING ELSE BY MOUTH OR LIE DOWN FOR THE NEXT 30 MIN.  Marland Kitchen ibuprofen (ADVIL,MOTRIN) 200 MG tablet Take 200 mg by mouth as needed.  . Lifitegrast 5 % SOLN Apply 1 drop to eye in the morning and at bedtime. 1 Gtt both eyes twice a day  . Multiple Vitamin (MULTI-VITAMINS) TABS Take 1 tablet by mouth daily. Reported on 04/25/2016  . Omega-3 Fatty Acids (OMEGA-3 FISH OIL) 300 MG CAPS Take by mouth daily.  Marland Kitchen PROAIR HFA 108 (90 Base)  MCG/ACT inhaler INHALE 2 PUFFS EVERY 6 HOURS AS NEEDED  . rifampin (RIFADIN) 150 MG capsule TAKE 1 CAPSULE BY MOUTH EVERY DAY  . SPIRIVA HANDIHALER 18 MCG inhalation capsule INHALE 1 CAPSULE VIA HANDIHALER ONCE DAILY AT THE SAME TIME EVERY DAY  . [DISCONTINUED] azithromycin (ZITHROMAX) 250 MG tablet TAKE 1 TABLET BY MOUTH EVERY DAY   Facility-Administered Encounter Medications as of 11/30/2020  Medication  . ipratropium-albuterol (DUONEB) 0.5-2.5 (3) MG/3ML nebulizer solution 3 mL    Surgical History: Past Surgical History:  Procedure Laterality Date  . APPENDECTOMY    . BRONCHOSCOPY  02/2016  . CATARACT EXTRACTION W/PHACO Right 10/26/2016   Procedure: CATARACT EXTRACTION PHACO AND INTRAOCULAR LENS PLACEMENT (IOC);  Surgeon: Estill Cotta, MD;  Location: [facility] ORS;  Service: Ophthalmology;  Laterality: Right;  [place] 2.17 AP% 26.5 CDE 56.86 Fluid pack lot # [id number]  . FLEXIBLE BRONCHOSCOPY Bilateral 03/16/2016   Procedure: FLEXIBLE BRONCHOSCOPY;  Surgeon: Allyne Gee, MD;  Location: [facility] ORS;  Service: Pulmonary;  Laterality: Bilateral;  . FLEXIBLE BRONCHOSCOPY N/A 03/03/2017   Procedure: FLEXIBLE BRONCHOSCOPY;  Surgeon: Allyne Gee, MD;  Location: [facility] ORS;  Service: Pulmonary;  Laterality: N/A;  . TUBAL LIGATION      Medical History: Past Medical History:  Diagnosis Date  . Bronchiectasis ([place]) 2011  . COPD (chronic obstructive pulmonary disease) ([place])   . HOH (hard of hearing)   . Hypertension   . Lung nodules   . Mycobacterium avium complex colonization   . Non-Hodgkin lymphoma ([place]) 2003  .  Personal history of chemotherapy   . Pneumonia    FREQUENT IN PAST    H/O MYCOBACTERIAL  . Rheumatoid arthritis ([place])   . Sjogren's syndrome ([place])   . Xerostomia     Family History: Family History  Problem Relation Age of Onset  . Breast cancer Neg Hx     Social History: Social History   Socioeconomic History  . Marital status: Married    Spouse name: Not on file  .  Number of children: Not on file  . Years of education: Not on file  . Highest education level: Not on file  Occupational History  . Not on file  Tobacco Use  . Smoking status: Never Smoker  . Smokeless tobacco: Never Used  Vaping Use  . Vaping Use: Never used  Substance and Sexual Activity  . Alcohol use: No  . Drug use: No  . Sexual activity: Not on file  Other Topics Concern  . Not on file  Social History Narrative  . Not on file   Social Determinants of Health   Financial Resource Strain: Not on file  Food Insecurity: Not on file  Transportation Needs: Not on file  Physical Activity: Not on file  Stress: Not on file  Social Connections: Not on file  Intimate Partner Violence: Not on file    Vital Signs: Blood pressure 109/86, pulse 99, temperature (!) 97.5 F (36.4 C), resp. rate 16, height _0  (1.549 m), weight 123 lb (55.8 kg), SpO2 98 %.  Examination: General Appearance: The patient is well-developed, well-nourished, and in no distress. Skin: Gross inspection of skin unremarkable. Head: normocephalic, no gross deformities. Eyes: no gross deformities noted. ENT: ears appear grossly normal no exudates. Neck: Supple. No thyromegaly. No LAD. Respiratory: No rhonchi noted at this time. Cardiovascular: Normal S1 and S2 without murmur or rub. Extremities: No cyanosis. pulses are equal. Neurologic: Alert and oriented. No involuntary movements.  LABS: No results found for this or any previous visit (from the past 2160 hour(s)).  Radiology: MM 3D SCREEN BREAST BILATERAL  Result Date: 02/24/2020 CLINICAL DATA:  Screening. EXAM: DIGITAL SCREENING BILATERAL MAMMOGRAM WITH TOMO AND CAD COMPARISON:  Previous exam(s). ACR Breast Density Category b: There are scattered areas of fibroglandular density. FINDINGS: There are no findings suspicious for malignancy. Images were processed with CAD. IMPRESSION: No mammographic evidence of malignancy. A result letter of this screening  mammogram will be mailed directly to the patient. RECOMMENDATION: Screening mammogram in one year. (Code:SM-B-01Y) BI-RADS CATEGORY  1: Negative. Electronically Signed   By: Lillia Mountain M.D.   On: 02/24/2020 13:10    No results found.  No results found.    Assessment and Plan: Patient Active Problem List   Diagnosis Date Noted  . Encounter for general adult medical examination with abnormal findings 08/08/2020  . Other fatigue 08/08/2020  . Need for vaccination against Streptococcus pneumoniae using pneumococcal conjugate vaccine 7 08/08/2020  . Age related osteoporosis 10/15/2018  . Ovarian failure 10/15/2018  . Screening for breast cancer 04/29/2018  . Essential hypertension 04/29/2018  . Obstructive chronic bronchitis without exacerbation ([place]) 04/29/2018  . MAI (mycobacterium avium-intracellulare) ([place]) 02/08/2018  . Numbness and tingling 03/25/2015  . Myopathy 02/19/2015  . Polyneuropathy 02/18/2015  . Weakness of both lower extremities 02/18/2015    1. MAI 2. Venous Arterial Insufficiency 3.   General Counseling: I have discussed the findings of the evaluation and examination with Nakira.  I have also discussed any further diagnostic evaluation thatmay be needed  or ordered today. Khrystal verbalizes understanding of the findings of todays visit. We also reviewed her medications today and discussed drug interactions and side effects including but not limited excessive drowsiness and altered mental states. We also discussed that there is always a risk not just to her but also people around her. she has been encouraged to call the office with any questions or concerns that should arise related to todays visit.  Orders Placed This Encounter  Procedures  . Spirometry with Graph    Order Specific Question:   Where should this test be performed?    Answer:   [facility]     Time spent: ***  I have personally obtained a history, examined the patient,  evaluated laboratory and imaging results, formulated the assessment and plan and placed orders.    Allyne Gee, MD [facility] Pulmonary and Critical Care Sleep medicine

## 2020-12-03 DIAGNOSIS — Z79899 Other long term (current) drug therapy: Secondary | ICD-10-CM | POA: Diagnosis not present

## 2020-12-04 LAB — COMPREHENSIVE METABOLIC PANEL
ALT: 12 IU/L (ref 0–32)
AST: 17 IU/L (ref 0–40)
Albumin/Globulin Ratio: 1.7 (ref 1.2–2.2)
Albumin: 4.4 g/dL (ref 3.7–4.7)
Alkaline Phosphatase: 81 IU/L (ref 44–121)
BUN/Creatinine Ratio: 27 (ref 12–28)
BUN: 16 mg/dL (ref 8–27)
Bilirubin Total: 0.4 mg/dL (ref 0.0–1.2)
CO2: 25 mmol/L (ref 20–29)
Calcium: 10.2 mg/dL (ref 8.7–10.3)
Chloride: 101 mmol/L (ref 96–106)
Creatinine, Ser: 0.6 mg/dL (ref 0.57–1.00)
GFR calc Af Amer: 101 mL/min/{1.73_m2} (ref >59)
GFR calc non Af Amer: 88 mL/min/{1.73_m2} (ref >59)
Globulin, Total: 2.6 g/dL (ref 1.5–4.5)
Glucose: 104 mg/dL — ABNORMAL HIGH (ref 65–99)
Potassium: 6.1 mmol/L — ABNORMAL HIGH (ref 3.5–5.2)
Sodium: 141 mmol/L (ref 134–144)
Total Protein: 7 g/dL (ref 6.0–8.5)

## 2020-12-04 LAB — CBC WITH DIFFERENTIAL/PLATELET
Basophils Absolute: 0.1 10*3/uL (ref 0.0–0.2)
Basos: 1 %
EOS (ABSOLUTE): 0.1 10*3/uL (ref 0.0–0.4)
Eos: 2 %
Hematocrit: 45.6 % (ref 34.0–46.6)
Hemoglobin: 15.2 g/dL (ref 11.1–15.9)
Immature Grans (Abs): 0 10*3/uL (ref 0.0–0.1)
Immature Granulocytes: 0 %
Lymphocytes Absolute: 1.3 10*3/uL (ref 0.7–3.1)
Lymphs: 20 %
MCH: 28.8 pg (ref 26.6–33.0)
MCHC: 33.3 g/dL (ref 31.5–35.7)
MCV: 87 fL (ref 79–97)
Monocytes Absolute: 0.7 10*3/uL (ref 0.1–0.9)
Monocytes: 11 %
Neutrophils Absolute: 4.4 10*3/uL (ref 1.4–7.0)
Neutrophils: 66 %
Platelets: 335 10*3/uL (ref 150–450)
RBC: 5.27 x10E6/uL (ref 3.77–5.28)
RDW: 12.2 % (ref 11.7–15.4)
WBC: 6.7 10*3/uL (ref 3.4–10.8)

## 2020-12-09 ENCOUNTER — Other Ambulatory Visit: Payer: Self-pay

## 2020-12-09 ENCOUNTER — Encounter: Payer: Self-pay | Admitting: Internal Medicine

## 2020-12-09 MED ORDER — IPRATROPIUM-ALBUTEROL 0.5-2.5 (3) MG/3ML IN SOLN: 3.0000 mL | RESPIRATORY_TRACT | 0 refills | Status: DC | PRN

## 2020-12-16 ENCOUNTER — Ambulatory Visit: Payer: Medicare Other

## 2020-12-16 ENCOUNTER — Other Ambulatory Visit: Payer: Self-pay

## 2020-12-16 ENCOUNTER — Ambulatory Visit (INDEPENDENT_AMBULATORY_CARE_PROVIDER_SITE_OTHER): Payer: Medicare Other

## 2020-12-16 DIAGNOSIS — I824Z3 Acute embolism and thrombosis of unspecified deep veins of distal lower extremity, bilateral: Secondary | ICD-10-CM

## 2020-12-16 DIAGNOSIS — H6123 Impacted cerumen, bilateral: Secondary | ICD-10-CM | POA: Diagnosis not present

## 2020-12-16 MED ORDER — IPRATROPIUM-ALBUTEROL 0.5-2.5 (3) MG/3ML IN SOLN: 3.0000 mL | RESPIRATORY_TRACT | 3 refills | Status: DC | PRN

## 2020-12-16 NOTE — Progress Notes (Signed)
Bilateral Ear lavage administered and completed on pt.  Informed patient's son that she may have a little dizziness after the ear lavage to watch patient.  Advised patients son that they can purchase some Debrox drops at the pharmacy to use in the future to help with wax buildup.

## 2020-12-17 ENCOUNTER — Other Ambulatory Visit: Payer: Self-pay | Admitting: Adult Health

## 2020-12-17 DIAGNOSIS — J4489 Other specified chronic obstructive pulmonary disease: Secondary | ICD-10-CM

## 2020-12-17 DIAGNOSIS — J449 Chronic obstructive pulmonary disease, unspecified: Secondary | ICD-10-CM

## 2020-12-17 DIAGNOSIS — J9611 Chronic respiratory failure with hypoxia: Secondary | ICD-10-CM

## 2020-12-21 ENCOUNTER — Encounter: Payer: Self-pay | Admitting: Internal Medicine

## 2020-12-21 NOTE — Telephone Encounter (Signed)
Please see this

## 2020-12-22 ENCOUNTER — Other Ambulatory Visit: Payer: Self-pay | Admitting: Adult Health

## 2020-12-22 ENCOUNTER — Encounter: Payer: Self-pay | Admitting: Internal Medicine

## 2020-12-22 ENCOUNTER — Other Ambulatory Visit: Payer: Self-pay | Admitting: Internal Medicine

## 2020-12-22 DIAGNOSIS — J4489 Other specified chronic obstructive pulmonary disease: Secondary | ICD-10-CM

## 2020-12-22 DIAGNOSIS — J449 Chronic obstructive pulmonary disease, unspecified: Secondary | ICD-10-CM

## 2020-12-22 DIAGNOSIS — J9611 Chronic respiratory failure with hypoxia: Secondary | ICD-10-CM

## 2020-12-22 MED ORDER — SPIRIVA HANDIHALER 18 MCG IN CAPS: ORAL_CAPSULE | RESPIRATORY_TRACT | 3 refills | Status: DC

## 2020-12-23 ENCOUNTER — Other Ambulatory Visit: Payer: PRIVATE HEALTH INSURANCE

## 2020-12-29 ENCOUNTER — Ambulatory Visit (INDEPENDENT_AMBULATORY_CARE_PROVIDER_SITE_OTHER): Payer: Medicare Other | Admitting: Internal Medicine

## 2020-12-29 ENCOUNTER — Encounter: Payer: Self-pay | Admitting: Internal Medicine

## 2020-12-29 VITALS — BP 128/76 | HR 81 | Temp 98.2°F | Resp 16 | Ht 61.0 in | Wt 127.4 lb

## 2020-12-29 DIAGNOSIS — A310 Pulmonary mycobacterial infection: Secondary | ICD-10-CM | POA: Diagnosis not present

## 2020-12-29 DIAGNOSIS — E875 Hyperkalemia: Secondary | ICD-10-CM

## 2020-12-29 DIAGNOSIS — J449 Chronic obstructive pulmonary disease, unspecified: Secondary | ICD-10-CM

## 2020-12-29 NOTE — Progress Notes (Signed)
[facility] [place], [place] [address]  Pulmonary Sleep Medicine   Office Visit Note  Patient Name: Breanna Christian DOB: 14-Oct-1941 MRN [id number]  Date of Service: 12/29/2020  Complaints/HPI: COPD MAI she is here for follow-up on overall she is actually doing well she has not had any flare-ups of her breathing.  She has not had any admissions to the hospital.  Her son was present at bedside states that she is consistently doing better.  ROS  General: (-) fever, (-) chills, (-) night sweats, (-) weakness Skin: (-) rashes, (-) itching,. Eyes: (-) visual changes, (-) redness, (-) itching. Nose and Sinuses: (-) nasal stuffiness or itchiness, (-) postnasal drip, (-) nosebleeds, (-) sinus trouble. Mouth and Throat: (-) sore throat, (-) hoarseness. Neck: (-) swollen glands, (-) enlarged thyroid, (-) neck pain. Respiratory: - cough, (-) bloody sputum, - shortness of breath, - wheezing. Cardiovascular: - ankle swelling, (-) chest pain. Lymphatic: (-) lymph node enlargement. Neurologic: (-) numbness, (-) tingling. Psychiatric: (-) anxiety, (-) depression   Current Medication: Outpatient Encounter Medications as of 12/29/2020  Medication Sig   ARTIFICIAL TEAR OP Place 1 drop into both eyes every 6 (six) hours as needed (dry eyes). A product from [place].   clotrimazole-betamethasone (LOTRISONE) cream APPLY TO AFFECTED AREA TWICE A DAY   ethambutol (MYAMBUTOL) 400 MG tablet TAKE 1 TABLET BY MOUTH EVERY DAY   ibandronate (BONIVA) 150 MG tablet Take 1 tablet (150 mg total) by mouth every 30 (thirty) days. Take in the morning with a full glass of water, on an empty stomach, and do not take anything else by mouth or lie down for the next 30 min.   ibuprofen (ADVIL,MOTRIN) 200 MG tablet Take 200 mg by mouth as needed.   ipratropium-albuterol (DUONEB) 0.5-2.5 (3) MG/3ML SOLN Take 3 mLs by nebulization every 2 (two) hours as needed. DX: J44.9   Lifitegrast 5 %  SOLN Apply 1 drop to eye in the morning and at bedtime. 1 Gtt both eyes twice a day   Multiple Vitamin (MULTI-VITAMINS) TABS Take 1 tablet by mouth daily. Reported on 04/25/2016   Omega-3 Fatty Acids (OMEGA-3 FISH OIL) 300 MG CAPS Take by mouth daily.   PROAIR HFA 108 (90 Base) MCG/ACT inhaler INHALE 2 PUFFS EVERY 6 HOURS AS NEEDED   rifampin (RIFADIN) 150 MG capsule TAKE 1 CAPSULE BY MOUTH EVERY DAY   sodium chloride HYPERTONIC 3 % nebulizer solution Take by nebulization as needed for other.   tiotropium (SPIRIVA HANDIHALER) 18 MCG inhalation capsule INHALE 1 CAPSULE VIA HANDIHALER ONCE DAILY   No facility-administered encounter medications on file as of 12/29/2020.    Surgical History: Past Surgical History:  Procedure Laterality Date   APPENDECTOMY     BRONCHOSCOPY  02/2016   CATARACT EXTRACTION W/PHACO Right 10/26/2016   Procedure: CATARACT EXTRACTION PHACO AND INTRAOCULAR LENS PLACEMENT ([place]);  Surgeon: Estill Cotta, MD;  Location: [facility] ORS;  Service: Ophthalmology;  Laterality: Right;  [place] 2.17 AP% 26.5 CDE 56.86 Fluid pack lot # [id number]   FLEXIBLE BRONCHOSCOPY Bilateral 03/16/2016   Procedure: FLEXIBLE BRONCHOSCOPY;  Surgeon: Allyne Gee, MD;  Location: [facility] ORS;  Service: Pulmonary;  Laterality: Bilateral;   FLEXIBLE BRONCHOSCOPY N/A 03/03/2017   Procedure: FLEXIBLE BRONCHOSCOPY;  Surgeon: Allyne Gee, MD;  Location: [facility] ORS;  Service: Pulmonary;  Laterality: N/A;   TUBAL LIGATION      Medical History: Past Medical History:  Diagnosis Date   Bronchiectasis ([place]) 2011   COPD (chronic obstructive  pulmonary disease) ([place])    HOH (hard of hearing)    Hypertension    Lung nodules    Mycobacterium avium complex colonization    Non-Hodgkin lymphoma ([place]) 2003   Personal history of chemotherapy    Pneumonia    FREQUENT IN PAST    H/O MYCOBACTERIAL   Rheumatoid arthritis ([place])    Sjogren's syndrome ([facility])    Xerostomia     Family History: Family  History  Problem Relation Age of Onset   Breast cancer Neg Hx     Social History: Social History   Socioeconomic History   Marital status: Married    Spouse name: Not on file   Number of children: Not on file   Years of education: Not on file   Highest education level: Not on file  Occupational History   Not on file  Tobacco Use   Smoking status: Never Smoker   Smokeless tobacco: Never Used  Vaping Use   Vaping Use: Never used  Substance and Sexual Activity   Alcohol use: No   Drug use: No   Sexual activity: Not on file  Other Topics Concern   Not on file  Social History Narrative   Not on file   Social Determinants of Health   Financial Resource Strain: Not on file  Food Insecurity: Not on file  Transportation Needs: Not on file  Physical Activity: Not on file  Stress: Not on file  Social Connections: Not on file  Intimate Partner Violence: Not on file    Vital Signs: Blood pressure 128/76, pulse 81, temperature 98.2 F (36.8 C), resp. rate 16, height _0  (1.549 m), weight 127 lb 6.4 oz (57.8 kg), SpO2 99 %.  Examination: General Appearance: The patient is well-developed, well-nourished, and in no distress. Skin: Gross inspection of skin unremarkable. Head: normocephalic, no gross deformities. Eyes: no gross deformities noted. ENT: ears appear grossly normal no exudates. Neck: Supple. No thyromegaly. No LAD. Respiratory: no rhonchi noted. Cardiovascular: Normal S1 and S2 without murmur or rub. Extremities: No cyanosis. pulses are equal. Neurologic: Alert and oriented. No involuntary movements.  LABS: Recent Results (from the past 2160 hour(s))  CBC w/Diff/Platelet     Status: None   Collection Time: 12/03/20 10:53 AM  Result Value Ref Range   WBC 6.7 3.4 - 10.8 x10E3/uL   RBC 5.27 3.77 - 5.28 x10E6/uL   Hemoglobin 15.2 11.1 - 15.9 g/dL   Hematocrit 45.6 34.0 - 46.6 %   MCV 87 79 - 97 fL   MCH 28.8 26.6 - 33.0 pg   MCHC 33.3 31.5 -  35.7 g/dL   RDW 12.2 11.7 - 15.4 %   Platelets 335 150 - 450 x10E3/uL   Neutrophils 66 Not Estab. %   Lymphs 20 Not Estab. %   Monocytes 11 Not Estab. %   Eos 2 Not Estab. %   Basos 1 Not Estab. %   Neutrophils Absolute 4.4 1.4 - 7.0 x10E3/uL   Lymphocytes Absolute 1.3 0.7 - 3.1 x10E3/uL   Monocytes Absolute 0.7 0.1 - 0.9 x10E3/uL   EOS (ABSOLUTE) 0.1 0.0 - 0.4 x10E3/uL   Basophils Absolute 0.1 0.0 - 0.2 x10E3/uL   Immature Granulocytes 0 Not Estab. %   Immature Grans (Abs) 0.0 0.0 - 0.1 x10E3/uL  Comprehensive Metabolic Panel (CMET)     Status: Abnormal   Collection Time: 12/03/20 10:53 AM  Result Value Ref Range   Glucose 104 (H) 65 - 99 mg/dL   BUN 16 8 -  27 mg/dL   Creatinine, Ser 0.60 0.57 - 1.00 mg/dL   GFR calc non Af Amer 88 >59 mL/min/1.73   GFR calc Af Amer 101 >59 mL/min/1.73    Comment: **In accordance with recommendations from the NKF-ASN Task force,**   [facility] is in the process of updating its eGFR calculation to the   2021 CKD-EPI creatinine equation that estimates kidney function   without a race variable.    BUN/Creatinine Ratio 27 12 - 28   Sodium 141 134 - 144 mmol/L   Potassium 6.1 (H) 3.5 - 5.2 mmol/L   Chloride 101 96 - 106 mmol/L   CO2 25 20 - 29 mmol/L   Calcium 10.2 8.7 - 10.3 mg/dL   Total Protein 7.0 6.0 - 8.5 g/dL   Albumin 4.4 3.7 - 4.7 g/dL   Globulin, Total 2.6 1.5 - 4.5 g/dL   Albumin/Globulin Ratio 1.7 1.2 - 2.2   Bilirubin Total 0.4 0.0 - 1.2 mg/dL   Alkaline Phosphatase 81 44 - 121 IU/L    Comment:               **Please note reference interval change**   AST 17 0 - 40 IU/L   ALT 12 0 - 32 IU/L    Radiology: MM 3D SCREEN BREAST BILATERAL  Result Date: 02/24/2020 CLINICAL DATA:  Screening. EXAM: DIGITAL SCREENING BILATERAL MAMMOGRAM WITH TOMO AND CAD COMPARISON:  Previous exam(s). ACR Breast Density Category b: There are scattered areas of fibroglandular density. FINDINGS: There are no findings suspicious for malignancy. Images were  processed with CAD. IMPRESSION: No mammographic evidence of malignancy. A result letter of this screening mammogram will be mailed directly to the patient. RECOMMENDATION: Screening mammogram in one year. (Code:SM-B-01Y) BI-RADS CATEGORY  1: Negative. Electronically Signed   By: Lillia Mountain M.D.   On: 02/24/2020 13:10    No results found.  No results found.    Assessment and Plan: Patient Active Problem List   Diagnosis Date Noted   Encounter for general adult medical examination with abnormal findings 08/08/2020   Other fatigue 08/08/2020   Need for vaccination against Streptococcus pneumoniae using pneumococcal conjugate vaccine 7 08/08/2020   Age related osteoporosis 10/15/2018   Ovarian failure 10/15/2018   Screening for breast cancer 04/29/2018   Essential hypertension 04/29/2018   Obstructive chronic bronchitis without exacerbation ([place]) 04/29/2018   MAI (mycobacterium avium-intracellulare) ([place]) 02/08/2018   Numbness and tingling 03/25/2015   Myopathy 02/19/2015   Polyneuropathy 02/18/2015   Weakness of both lower extremities 02/18/2015      1. Obstructive chronic bronchitis without exacerbation ([place])   Severe disease on a she seems to be stable will continue with present management.  The patient has been doing well since she was started on antimycobacterial therapy for the MAI  2. MAI (mycobacterium avium-intracellulare) ([place])  improved after therapy will continue to monitor her closely.  Also continue to monitor labs.  3. Hyperkalemia   A she had some elevation in her potassium levels - Basic Metabolic Panel (BMET)   General Counseling: I have discussed the findings of the evaluation and examination with Airlie.  I have also discussed any further diagnostic evaluation thatmay be needed or ordered today. G[place]ell verbalizes understanding of the findings of todays visit. We also reviewed her medications today and discussed drug interactions and side  effects including but not limited excessive drowsiness and altered mental states. We also discussed that there is always a risk not just to her but also people around  her. she has been encouraged to call the office with any questions or concerns that should arise related to todays visit.  No orders of the defined types were placed in this encounter.    Time spent: 64  I have personally obtained a history, examined the patient, evaluated laboratory and imaging results, formulated the assessment and plan and placed orders.    Allyne Gee, MD [facility] Pulmonary and Critical Care Sleep medicine

## 2020-12-29 NOTE — Patient Instructions (Signed)
Chronic Obstructive Pulmonary Disease  Chronic obstructive pulmonary disease (COPD) is a long-term (chronic) lung problem. When you have COPD, it is hard for air to get in and out of your lungs. Usually the condition gets worse over time, and your lungs will never return to normal. There are things you can do to keep yourself as healthy as possible. What are the causes?  Smoking. This is the most common cause.  Certain genes passed from parent to child (inherited). What increases the risk?  Being exposed to secondhand smoke from cigarettes, pipes, or cigars.  Being exposed to chemicals and other irritants, such as fumes and dust in the work environment.  Having chronic lung conditions or infections. What are the signs or symptoms?  Shortness of breath, especially during physical activity.  A long-term cough with a large amount of thick mucus. Sometimes, the cough may not have any mucus (dry cough).  Wheezing.  Breathing quickly.  Skin that looks gray or blue, especially in the fingers, toes, or lips.  Feeling tired (fatigue).  Weight loss.  Chest tightness.  Having infections often.  Episodes when breathing symptoms become much worse (exacerbations). At the later stages of this disease, you may have swelling in the ankles, feet, or legs. How is this treated?  Taking medicines.  Quitting smoking, if you smoke.  Rehabilitation. This includes steps to make your body work better. It may involve a team of specialists.  Doing exercises.  Making changes to your diet.  Using oxygen.  Lung surgery.  Lung transplant.  Comfort measures (palliative care). Follow these instructions at home: Medicines  Take over-the-counter and prescription medicines only as told by your doctor.  Talk to your doctor before taking any cough or allergy medicines. You may need to avoid medicines that cause your lungs to be dry. Lifestyle  If you smoke, stop smoking. Smoking makes the  problem worse.  Do not smoke or use any products that contain nicotine or tobacco. If you need help quitting, ask your doctor.  Avoid being around things that make your breathing worse. This may include smoke, chemicals, and fumes.  Stay active, but remember to rest as well.  Learn and use tips on how to manage stress and control your breathing.  Make sure you get enough sleep. Most adults need at least 7 hours of sleep every night.  Eat healthy foods. Eat smaller meals more often. Rest before meals. Controlled breathing Learn and use tips on how to control your breathing as told by your doctor. Try:  Breathing in (inhaling) through your nose for 1 second. Then, pucker your lips and breath out (exhale) through your lips for 2 seconds.  Putting one hand on your belly (abdomen). Breathe in slowly through your nose for 1 second. Your hand on your belly should move out. Pucker your lips and breathe out slowly through your lips. Your hand on your belly should move in as you breathe out.   Controlled coughing Learn and use controlled coughing to clear mucus from your lungs. Follow these steps: 1. Lean your head a little forward. 2. Breathe in deeply. 3. Try to hold your breath for 3 seconds. 4. Keep your mouth slightly open while coughing 2 times. 5. Spit any mucus out into a tissue. 6. Rest and do the steps again 1 or 2 times as needed. General instructions  Make sure you get all the shots (vaccines) that your doctor recommends. Ask your doctor about a flu shot and a pneumonia shot.    Use oxygen therapy and pulmonary rehabilitation if told by your doctor. If you need home oxygen therapy, ask your doctor if you should buy a tool to measure your oxygen level (oximeter).  Make a COPD action plan with your doctor. This helps you to know what to do if you feel worse than usual.  Manage any other conditions you have as told by your doctor.  Avoid going outside when it is very hot, cold, or  humid.  Avoid people who have a sickness you can catch (contagious).  Keep all follow-up visits. Contact a doctor if:  You cough up more mucus than usual.  There is a change in the color or thickness of the mucus.  It is harder to breathe than usual.  Your breathing is faster than usual.  You have trouble sleeping.  You need to use your medicines more often than usual.  You have trouble doing your normal activities such as getting dressed or walking around the house. Get help right away if:  You have shortness of breath while resting.  You have shortness of breath that stops you from: ? Being able to talk. ? Doing normal activities.  Your chest hurts for longer than 5 minutes.  Your skin color is more blue than usual.  Your pulse oximeter shows that you have low oxygen for longer than 5 minutes.  You have a fever.  You feel too tired to breathe normally. These symptoms may represent a serious problem that is an emergency. Do not wait to see if the symptoms will go away. Get medical help right away. Call your local emergency services (911 in the [place] Do not drive yourself to the hospital. Summary  Chronic obstructive pulmonary disease (COPD) is a long-term lung problem.  The way your lungs work will never return to normal. Usually the condition gets worse over time. There are things you can do to keep yourself as healthy as possible.  Take over-the-counter and prescription medicines only as told by your doctor.  If you smoke, stop. Smoking makes the problem worse. This information is not intended to replace advice given to you by your health care provider. Make sure you discuss any questions you have with your health care provider. Document Revised: 09/15/2020 Document Reviewed: 09/15/2020 Elsevier Patient Education  2021 Elsevier Inc.   

## 2021-01-01 ENCOUNTER — Telehealth: Payer: Self-pay

## 2021-01-01 NOTE — Telephone Encounter (Signed)
Called pharmacy about pt's rx of IPRATROPIUM-ALBUTEROL solution, had pharmacy run insurance thru medicare part B and per pharmacy tech I needed to put the dx code on prescription and resend it to them.  Per insurance they said that medicare part D soesn't cover the medication to run it thru pard B.  Per pharmacy, medication went thru on insurance on medicare part B  Spoke to pt's son and told him information, they will see how the co-pay is or they can use the [place] coupons to get medication

## 2021-01-06 DIAGNOSIS — E875 Hyperkalemia: Secondary | ICD-10-CM | POA: Diagnosis not present

## 2021-01-07 LAB — BASIC METABOLIC PANEL
BUN/Creatinine Ratio: 19 (ref 12–28)
BUN: 14 mg/dL (ref 8–27)
CO2: 26 mmol/L (ref 20–29)
Calcium: 10 mg/dL (ref 8.7–10.3)
Chloride: 98 mmol/L (ref 96–106)
Creatinine, Ser: 0.74 mg/dL (ref 0.57–1.00)
GFR calc Af Amer: 90 mL/min/{1.73_m2} (ref >59)
GFR calc non Af Amer: 78 mL/min/{1.73_m2} (ref >59)
Glucose: 96 mg/dL (ref 65–99)
Potassium: 5.4 mmol/L — ABNORMAL HIGH (ref 3.5–5.2)
Sodium: 140 mmol/L (ref 134–144)

## 2021-01-19 ENCOUNTER — Ambulatory Visit (INDEPENDENT_AMBULATORY_CARE_PROVIDER_SITE_OTHER): Payer: Medicare Other | Admitting: Internal Medicine

## 2021-01-19 ENCOUNTER — Encounter: Payer: Self-pay | Admitting: Internal Medicine

## 2021-01-19 ENCOUNTER — Other Ambulatory Visit: Payer: Self-pay

## 2021-01-19 DIAGNOSIS — M810 Age-related osteoporosis without current pathological fracture: Secondary | ICD-10-CM | POA: Diagnosis not present

## 2021-01-19 DIAGNOSIS — A310 Pulmonary mycobacterial infection: Secondary | ICD-10-CM

## 2021-01-19 DIAGNOSIS — R55 Syncope and collapse: Secondary | ICD-10-CM

## 2021-01-19 DIAGNOSIS — E875 Hyperkalemia: Secondary | ICD-10-CM | POA: Diagnosis not present

## 2021-01-19 NOTE — Progress Notes (Signed)
[facility] [place], [place] [address]  Internal MEDICINE  Office Visit Note  Patient Name: Breanna Christian  [id number]  [id number]  Date of Service: 01/23/2021  Chief Complaint  Patient presents with  . Follow-up    6 month  . COPD  . Hypertension    HPI  Pt is here for follow up. In the room with her son who translates for her. Overall she feels well however had an episode where she felt like she will pass out. According the son she did not communicate this with her. Recent echo has been normal. Does have MAI associated copd and is well controlled on current therapy, last few labs did show elevated potassium level Denies any chest pain or sob, she has been more active. Planning to go to [place] for summer    Current Medication: Outpatient Encounter Medications as of 01/19/2021  Medication Sig  . ARTIFICIAL TEAR OP Place 1 drop into both eyes every 6 (six) hours as needed (dry eyes). A product from [place].  . clotrimazole-betamethasone (LOTRISONE) cream APPLY TO AFFECTED AREA TWICE A DAY  . ethambutol (MYAMBUTOL) 400 MG tablet TAKE 1 TABLET BY MOUTH EVERY DAY  . ibandronate (BONIVA) 150 MG tablet Take 1 tablet (150 mg total) by mouth every 30 (thirty) days. Take in the morning with a full glass of water, on an empty stomach, and do not take anything else by mouth or lie down for the next 30 min.  Marland Kitchen ibuprofen (ADVIL,MOTRIN) 200 MG tablet Take 200 mg by mouth as needed.  Marland Kitchen ipratropium-albuterol (DUONEB) 0.5-2.5 (3) MG/3ML SOLN Take 3 mLs by nebulization every 2 (two) hours as needed. DX: J44.9  . Lifitegrast 5 % SOLN Apply 1 drop to eye in the morning and at bedtime. 1 Gtt both eyes twice a day  . Multiple Vitamin (MULTI-VITAMINS) TABS Take 1 tablet by mouth daily. Reported on 04/25/2016  . Omega-3 Fatty Acids (OMEGA-3 FISH OIL) 300 MG CAPS Take by mouth daily.  Marland Kitchen PROAIR HFA 108 (90 Base) MCG/ACT inhaler INHALE 2 PUFFS EVERY 6 HOURS AS NEEDED  . rifampin  (RIFADIN) 150 MG capsule TAKE 1 CAPSULE BY MOUTH EVERY DAY  . sodium chloride HYPERTONIC 3 % nebulizer solution Take by nebulization as needed for other.  . tiotropium (SPIRIVA HANDIHALER) 18 MCG inhalation capsule INHALE 1 CAPSULE VIA HANDIHALER ONCE DAILY   No facility-administered encounter medications on file as of 01/19/2021.    Surgical History: Past Surgical History:  Procedure Laterality Date  . APPENDECTOMY    . BRONCHOSCOPY  02/2016  . CATARACT EXTRACTION W/PHACO Right 10/26/2016   Procedure: CATARACT EXTRACTION PHACO AND INTRAOCULAR LENS PLACEMENT (IOC);  Surgeon: Estill Cotta, MD;  Location: [facility] ORS;  Service: Ophthalmology;  Laterality: Right;  [place] 2.17 AP% 26.5 CDE 56.86 Fluid pack lot # [id number]  . FLEXIBLE BRONCHOSCOPY Bilateral 03/16/2016   Procedure: FLEXIBLE BRONCHOSCOPY;  Surgeon: Allyne Gee, MD;  Location: [facility] ORS;  Service: Pulmonary;  Laterality: Bilateral;  . FLEXIBLE BRONCHOSCOPY N/A 03/03/2017   Procedure: FLEXIBLE BRONCHOSCOPY;  Surgeon: Allyne Gee, MD;  Location: [facility] ORS;  Service: Pulmonary;  Laterality: N/A;  . TUBAL LIGATION      Medical History: Past Medical History:  Diagnosis Date  . Bronchiectasis ([place]) 2011  . COPD (chronic obstructive pulmonary disease) ([place])   . HOH (hard of hearing)   . Hypertension   . Lung nodules   . Mycobacterium avium complex colonization   . Non-Hodgkin lymphoma ([place]) 2003  .  Personal history of chemotherapy   . Pneumonia    FREQUENT IN PAST    H/O MYCOBACTERIAL  . Rheumatoid arthritis ([place])   . Sjogren's syndrome ([place])   . Xerostomia     Family History: Family History  Problem Relation Age of Onset  . Breast cancer Neg Hx     Social History   Socioeconomic History  . Marital status: Married    Spouse name: Not on file  . Number of children: Not on file  . Years of education: Not on file  . Highest education level: Not on file  Occupational History  . Not on file  Tobacco Use  . Smoking  status: Never Smoker  . Smokeless tobacco: Never Used  Vaping Use  . Vaping Use: Never used  Substance and Sexual Activity  . Alcohol use: No  . Drug use: No  . Sexual activity: Not on file  Other Topics Concern  . Not on file  Social History Narrative  . Not on file   Social Determinants of Health   Financial Resource Strain: Not on file  Food Insecurity: Not on file  Transportation Needs: Not on file  Physical Activity: Not on file  Stress: Not on file  Social Connections: Not on file  Intimate Partner Violence: Not on file      Review of Systems  Constitutional: Negative for fatigue and fever.  HENT: Negative for congestion, mouth sores and postnasal drip.   Eyes: Negative.   Respiratory: Negative for cough and shortness of breath.   Cardiovascular: Negative for chest pain and palpitations.  Gastrointestinal: Negative for abdominal pain.  Endocrine: Negative.   Genitourinary: Negative for flank pain.  Musculoskeletal: Negative for back pain and gait problem.  Skin: Negative.   Neurological: Positive for syncope.       Lightheaded   Psychiatric/Behavioral: Negative for agitation and confusion. The patient is not hyperactive.     Vital Signs: BP 110/80   Pulse 75   Temp 98 F (36.7 C)   Resp 16   Ht _0  (1.575 m)   Wt 128 lb 3.2 oz (58.2 kg)   SpO2 98%   BMI 23.45 kg/m    Physical Exam Constitutional:      General: She is not in acute distress.    Appearance: She is well-developed. She is not diaphoretic.  HENT:     Head: Normocephalic and atraumatic.     Mouth/Throat:     Pharynx: No oropharyngeal exudate.  Eyes:     Pupils: Pupils are equal, round, and reactive to light.  Neck:     Thyroid: No thyromegaly.     Vascular: No JVD.     Trachea: No tracheal deviation.  Cardiovascular:     Rate and Rhythm: Normal rate and regular rhythm.     Heart sounds: Normal heart sounds. No murmur heard. No friction rub. No gallop.   Pulmonary:     Effort:  Pulmonary effort is normal. No respiratory distress.     Breath sounds: No wheezing or rales.  Chest:     Chest wall: No tenderness.  Abdominal:     General: Bowel sounds are normal.     Palpations: Abdomen is soft.  Musculoskeletal:        General: Normal range of motion.     Cervical back: Normal range of motion and neck supple.  Lymphadenopathy:     Cervical: No cervical adenopathy.  Skin:    General: Skin is warm and dry.  Neurological:     Mental Status: She is alert and oriented to person, place, and time.     Cranial Nerves: No cranial nerve deficit.  Psychiatric:        Behavior: Behavior normal.        Thought Content: Thought content normal.        Judgment: Judgment normal.     Assessment/Plan: 1. Hyperkalemia Reviewed previous labs, potassium is chronically elevated, might need to see nephrology for possible RTA. No obvious cause  - Basic metabolic panel  2. Syncope, unspecified syncope type EKG does show peaked T waves, will repeat potassium level  - EKG 12-Lead  3. Senile osteoporosis Continue therapy and wt bearing, pt has done well   4. MAI (mycobacterium avium-intracellulare) (HCC) On Ethambutol and rifampin and followed by pulmonary   General Counseling: Emali verbalizes understanding of the findings of todays visit and agrees with plan of treatment. I have discussed any further diagnostic evaluation that may be needed or ordered today. We also reviewed her medications today. she has been encouraged to call the office with any questions or concerns that should arise related to todays visit.    Orders Placed This Encounter  Procedures  . Basic metabolic panel  . EKG 12-Lead     Total time spent:30 Minutes Time spent includes review of chart, medications, test results, and follow up plan with the patient.   [place] Controlled Substance Database was reviewed by me.   Dr Lavera Guise Internal medicine

## 2021-02-03 DIAGNOSIS — E875 Hyperkalemia: Secondary | ICD-10-CM | POA: Diagnosis not present

## 2021-02-04 ENCOUNTER — Other Ambulatory Visit: Payer: Self-pay | Admitting: Internal Medicine

## 2021-02-04 DIAGNOSIS — A310 Pulmonary mycobacterial infection: Secondary | ICD-10-CM

## 2021-02-04 LAB — BASIC METABOLIC PANEL
BUN/Creatinine Ratio: 17 (ref 12–28)
BUN: 10 mg/dL (ref 8–27)
CO2: 25 mmol/L (ref 20–29)
Calcium: 9.7 mg/dL (ref 8.7–10.3)
Chloride: 98 mmol/L (ref 96–106)
Creatinine, Ser: 0.59 mg/dL (ref 0.57–1.00)
Glucose: 99 mg/dL (ref 65–99)
Potassium: 5.7 mmol/L — ABNORMAL HIGH (ref 3.5–5.2)
Sodium: 138 mmol/L (ref 134–144)
eGFR: 92 mL/min/{1.73_m2} (ref >59)

## 2021-02-04 NOTE — Progress Notes (Signed)
High potassium again, will need to see nephrology for possible RTA

## 2021-02-06 NOTE — Progress Notes (Signed)
Nephrology with Dr Holley Raring

## 2021-02-09 ENCOUNTER — Telehealth: Payer: Self-pay

## 2021-02-09 NOTE — Telephone Encounter (Signed)
Faxed referral for nephrology to dr Anthonette Legato

## 2021-02-09 NOTE — Addendum Note (Signed)
Addended by: Edd Arbour on: 02/09/2021 01:02 PM   Modules accepted: Orders

## 2021-02-12 ENCOUNTER — Other Ambulatory Visit: Payer: Self-pay | Admitting: Nurse Practitioner

## 2021-02-12 DIAGNOSIS — M810 Age-related osteoporosis without current pathological fracture: Secondary | ICD-10-CM

## 2021-02-23 DIAGNOSIS — E875 Hyperkalemia: Secondary | ICD-10-CM | POA: Diagnosis not present

## 2021-03-22 ENCOUNTER — Other Ambulatory Visit: Payer: Self-pay | Admitting: Nurse Practitioner

## 2021-03-22 DIAGNOSIS — M810 Age-related osteoporosis without current pathological fracture: Secondary | ICD-10-CM

## 2021-03-23 ENCOUNTER — Other Ambulatory Visit: Payer: Self-pay

## 2021-03-23 ENCOUNTER — Telehealth: Payer: Self-pay

## 2021-03-23 MED ORDER — IPRATROPIUM-ALBUTEROL 0.5-2.5 (3) MG/3ML IN SOLN: 3.0000 mL | RESPIRATORY_TRACT | 3 refills | Status: DC | PRN

## 2021-03-23 NOTE — Telephone Encounter (Signed)
Spoke to pt's son about the PA on Duoneb and he will talk with his mother and see if she is really needing/using medication and will let me know

## 2021-04-01 DIAGNOSIS — E875 Hyperkalemia: Secondary | ICD-10-CM | POA: Diagnosis not present

## 2021-04-08 ENCOUNTER — Encounter: Payer: Self-pay | Admitting: Nurse Practitioner

## 2021-04-08 ENCOUNTER — Ambulatory Visit (INDEPENDENT_AMBULATORY_CARE_PROVIDER_SITE_OTHER): Payer: Medicare Other | Admitting: Nurse Practitioner

## 2021-04-08 ENCOUNTER — Other Ambulatory Visit: Payer: Self-pay

## 2021-04-08 DIAGNOSIS — E875 Hyperkalemia: Secondary | ICD-10-CM

## 2021-04-08 DIAGNOSIS — I10 Essential (primary) hypertension: Secondary | ICD-10-CM

## 2021-04-08 DIAGNOSIS — J449 Chronic obstructive pulmonary disease, unspecified: Secondary | ICD-10-CM

## 2021-04-08 DIAGNOSIS — M810 Age-related osteoporosis without current pathological fracture: Secondary | ICD-10-CM | POA: Diagnosis not present

## 2021-04-08 NOTE — Progress Notes (Signed)
[facility] [place], [place] [address]  Internal MEDICINE  Office Visit Note  Patient Name: Breanna Christian  [id number]  [id number]  Date of Service: 04/11/2021  Chief Complaint  Patient presents with  . Follow-up    Discuss potassium and meds  . Hypertension  . COPD  . Quality Metric Gaps    Has had booster for Covid     HPI Patient presents for follow up visit to discuss potassium and medications, as well as hypertension and COPD. Elevated potassium - seen by nephro, placed order for potassium binder, will recheck potassium next week.  Limiting potassium in diet. Last potassium level at the nephro office was 5.3 -takes boniva for osteoporosis -bp well-controlled, no medications at this time.  -COPD, well managed on current medications.   Current Medication: Outpatient Encounter Medications as of 04/08/2021  Medication Sig  . ARTIFICIAL TEAR OP Place 1 drop into both eyes every 6 (six) hours as needed (dry eyes). A product from [place].  Marland Kitchen azithromycin (ZITHROMAX) 250 MG tablet TAKE 1 TABLET BY MOUTH EVERY DAY  . clotrimazole-betamethasone (LOTRISONE) cream APPLY TO AFFECTED AREA TWICE A DAY  . ethambutol (MYAMBUTOL) 400 MG tablet TAKE 1 TABLET BY MOUTH EVERY DAY  . ibandronate (BONIVA) 150 MG tablet Take 1 tablet (150 mg total) by mouth every 30 (thirty) days. Take in the morning with a full glass of water, on an empty stomach, and do not take anything else by mouth or lie down for the next 30 min.  Marland Kitchen ibuprofen (ADVIL,MOTRIN) 200 MG tablet Take 200 mg by mouth as needed.  Marland Kitchen ipratropium-albuterol (DUONEB) 0.5-2.5 (3) MG/3ML SOLN Take 3 mLs by nebulization every 2 (two) hours as needed. DX: J44.9  . Lifitegrast 5 % SOLN Apply 1 drop to eye in the morning and at bedtime. 1 Gtt both eyes twice a day  . Multiple Vitamin (MULTI-VITAMINS) TABS Take 1 tablet by mouth daily. Reported on 04/25/2016  . Omega-3 Fatty Acids (OMEGA-3 FISH OIL) 300 MG CAPS Take by  mouth daily.  Marland Kitchen PROAIR HFA 108 (90 Base) MCG/ACT inhaler INHALE 2 PUFFS EVERY 6 HOURS AS NEEDED  . rifampin (RIFADIN) 150 MG capsule TAKE 1 CAPSULE BY MOUTH EVERY DAY  . sodium chloride HYPERTONIC 3 % nebulizer solution Take by nebulization as needed for other.  . tiotropium (SPIRIVA HANDIHALER) 18 MCG inhalation capsule INHALE 1 CAPSULE VIA HANDIHALER ONCE DAILY   No facility-administered encounter medications on file as of 04/08/2021.    Surgical History: Past Surgical History:  Procedure Laterality Date  . APPENDECTOMY    . BRONCHOSCOPY  02/2016  . CATARACT EXTRACTION W/PHACO Right 10/26/2016   Procedure: CATARACT EXTRACTION PHACO AND INTRAOCULAR LENS PLACEMENT (IOC);  Surgeon: Estill Cotta, MD;  Location: [facility] ORS;  Service: Ophthalmology;  Laterality: Right;  [place] 2.17 AP% 26.5 CDE 56.86 Fluid pack lot # [id number]  . FLEXIBLE BRONCHOSCOPY Bilateral 03/16/2016   Procedure: FLEXIBLE BRONCHOSCOPY;  Surgeon: Allyne Gee, MD;  Location: [facility] ORS;  Service: Pulmonary;  Laterality: Bilateral;  . FLEXIBLE BRONCHOSCOPY N/A 03/03/2017   Procedure: FLEXIBLE BRONCHOSCOPY;  Surgeon: Allyne Gee, MD;  Location: [facility] ORS;  Service: Pulmonary;  Laterality: N/A;  . TUBAL LIGATION      Medical History: Past Medical History:  Diagnosis Date  . Bronchiectasis ([place]) 2011  . COPD (chronic obstructive pulmonary disease) ([place])   . HOH (hard of hearing)   . Hypertension   . Lung nodules   . Mycobacterium avium complex  colonization   . Non-Hodgkin lymphoma ([place]) 2003  . Personal history of chemotherapy   . Pneumonia    FREQUENT IN PAST    H/O MYCOBACTERIAL  . Rheumatoid arthritis ([place])   . Sjogren's syndrome ([place])   . Xerostomia     Family History: Family History  Problem Relation Age of Onset  . Cancer Sister   . Breast cancer Neg Hx     Social History   Socioeconomic History  . Marital status: Married    Spouse name: Not on file  . Number of children: Not on file  . Years of  education: Not on file  . Highest education level: Not on file  Occupational History  . Not on file  Tobacco Use  . Smoking status: Never Smoker  . Smokeless tobacco: Never Used  Vaping Use  . Vaping Use: Never used  Substance and Sexual Activity  . Alcohol use: No  . Drug use: No  . Sexual activity: Not on file  Other Topics Concern  . Not on file  Social History Narrative  . Not on file   Social Determinants of Health   Financial Resource Strain: Not on file  Food Insecurity: Not on file  Transportation Needs: Not on file  Physical Activity: Not on file  Stress: Not on file  Social Connections: Not on file  Intimate Partner Violence: Not on file      Review of Systems  Constitutional: Negative for chills, fatigue and unexpected weight change.  HENT: Negative for congestion, rhinorrhea, sneezing and sore throat.   Eyes: Negative for redness.  Respiratory: Negative for cough, chest tightness and shortness of breath.   Cardiovascular: Negative for chest pain and palpitations.  Gastrointestinal: Negative for abdominal pain, constipation, diarrhea, nausea and vomiting.  Genitourinary: Negative for dysuria and frequency.  Musculoskeletal: Negative for arthralgias, back pain, joint swelling and neck pain.  Skin: Negative for rash.  Neurological: Negative.  Negative for tremors and numbness.  Hematological: Negative for adenopathy. Does not bruise/bleed easily.  Psychiatric/Behavioral: Negative for behavioral problems (Depression), sleep disturbance and suicidal ideas. The patient is not nervous/anxious.     Vital Signs: BP 128/76   Pulse 92   Temp 97.6 F (36.4 C)   Resp 16   Ht _0  (1.575 m)   Wt 123 lb 6.4 oz (56 kg)   SpO2 95%   BMI 22.57 kg/m    Physical Exam Vitals reviewed.  Constitutional:      General: She is not in acute distress.    Appearance: She is well-developed. She is not diaphoretic.  HENT:     Head: Normocephalic and atraumatic.  Neck:      Thyroid: No thyromegaly.     Vascular: No JVD.     Trachea: No tracheal deviation.  Cardiovascular:     Rate and Rhythm: Normal rate and regular rhythm.     Pulses: Normal pulses.     Heart sounds: Normal heart sounds. No murmur heard. No friction rub. No gallop.   Pulmonary:     Effort: Pulmonary effort is normal. No respiratory distress.     Breath sounds: Normal breath sounds. No wheezing or rales.  Musculoskeletal:     Cervical back: Normal range of motion and neck supple.  Skin:    General: Skin is warm and dry.     Capillary Refill: Capillary refill takes less than 2 seconds.  Neurological:     Mental Status: She is alert and oriented to person, place, and  time.     Cranial Nerves: No cranial nerve deficit.  Psychiatric:        Behavior: Behavior normal.        Thought Content: Thought content normal.        Judgment: Judgment normal.    Assessment/Plan: 1. Chronic obstructive pulmonary disease, unspecified COPD type ([place]) Stable on current medications. Spiriva daily, saline nebulizer and duoneb nebulizer treatments. Follow up in 4 months .   2. Hyperkalemia Elevated potassium due to long-term use of multiple antibiotics, followed by Dr. Holley Raring, nephrology. Will contact Dr. Holley Raring to discuss any other interventions tht can be done outside of nephrology to prevent the potassium from rising anymore and/or lower it.  3. Age-related osteoporosis without current pathological fracture Taking Boniva for this problem, no pathological fracture,   4. Essential hypertension BP is controlled, she is not currently taking any medications. Will recheck the BP at follow up visit.    General Counseling: Rena verbalizes understanding of the findings of todays visit and agrees with plan of treatment. I have discussed any further diagnostic evaluation that may be needed or ordered today. We also reviewed her medications today. she has been encouraged to call the office with any  questions or concerns that should arise related to todays visit.    No orders of the defined types were placed in this encounter.   No orders of the defined types were placed in this encounter.  Return in about 4 months (around 08/09/2021) for F/U, Review labs/test, Hilman Kissling PCP.   Total time spent: 30 Minutes Time spent includes review of chart, medications, test results, and follow up plan with the patient.   [place] Controlled Substance Database was reviewed by me.  This patient was seen by Jonetta Osgood, FNP-C in collaboration with Dr. Clayborn Bigness as a part of collaborative care agreement.   Dr Lavera Guise Internal medicine

## 2021-04-29 DIAGNOSIS — E875 Hyperkalemia: Secondary | ICD-10-CM | POA: Diagnosis not present

## 2021-05-26 ENCOUNTER — Other Ambulatory Visit: Payer: Self-pay | Admitting: Nurse Practitioner

## 2021-05-26 DIAGNOSIS — M810 Age-related osteoporosis without current pathological fracture: Secondary | ICD-10-CM

## 2021-05-27 ENCOUNTER — Other Ambulatory Visit: Payer: Self-pay | Admitting: Nurse Practitioner

## 2021-05-27 DIAGNOSIS — M810 Age-related osteoporosis without current pathological fracture: Secondary | ICD-10-CM

## 2021-05-31 ENCOUNTER — Other Ambulatory Visit: Payer: Self-pay | Admitting: Nurse Practitioner

## 2021-05-31 DIAGNOSIS — M810 Age-related osteoporosis without current pathological fracture: Secondary | ICD-10-CM

## 2021-06-01 DIAGNOSIS — Z23 Encounter for immunization: Secondary | ICD-10-CM | POA: Diagnosis not present

## 2021-06-07 DIAGNOSIS — E875 Hyperkalemia: Secondary | ICD-10-CM | POA: Diagnosis not present

## 2021-07-01 ENCOUNTER — Ambulatory Visit: Payer: PRIVATE HEALTH INSURANCE | Admitting: Internal Medicine

## 2021-07-15 ENCOUNTER — Ambulatory Visit: Payer: PRIVATE HEALTH INSURANCE | Admitting: Internal Medicine

## 2021-07-27 ENCOUNTER — Ambulatory Visit: Payer: PRIVATE HEALTH INSURANCE | Admitting: Internal Medicine

## 2021-07-30 ENCOUNTER — Telehealth: Payer: Self-pay

## 2021-07-30 NOTE — Telephone Encounter (Signed)
PT Son called stating patient has been coughing more and having little harder time breathing since she came back from [place].Pt  covid test negative at home test this going on for 2 weeks advised him that as per DFK we going add   and continue antibiotic what she already taking and use her inhaler as prescribed  and also take mucinex OTC and used nebulizer every 6 to 8 hrs as needed and we can she her monday

## 2021-08-02 ENCOUNTER — Ambulatory Visit
Admission: RE | Admit: 2021-08-02 | Discharge: 2021-08-02 | Disposition: A | Discharge status: Home / Self Care | Payer: Medicare Other | Source: Ambulatory Visit | Attending: Internal Medicine | Admitting: Internal Medicine

## 2021-08-02 ENCOUNTER — Ambulatory Visit
Admission: RE | Admit: 2021-08-02 | Discharge: 2021-08-02 | Disposition: A | Discharge status: Home / Self Care | Payer: Medicare Other | Attending: Internal Medicine | Admitting: Internal Medicine

## 2021-08-02 ENCOUNTER — Other Ambulatory Visit: Payer: Self-pay

## 2021-08-02 ENCOUNTER — Ambulatory Visit (INDEPENDENT_AMBULATORY_CARE_PROVIDER_SITE_OTHER): Payer: Medicare Other | Admitting: Internal Medicine

## 2021-08-02 ENCOUNTER — Encounter: Payer: Self-pay | Admitting: Internal Medicine

## 2021-08-02 VITALS — BP 112/70 | HR 118 | Temp 98.3°F | Resp 16 | Ht 61.0 in | Wt 114.4 lb

## 2021-08-02 DIAGNOSIS — R634 Abnormal weight loss: Secondary | ICD-10-CM | POA: Diagnosis not present

## 2021-08-02 DIAGNOSIS — A310 Pulmonary mycobacterial infection: Secondary | ICD-10-CM | POA: Diagnosis not present

## 2021-08-02 DIAGNOSIS — R0602 Shortness of breath: Secondary | ICD-10-CM

## 2021-08-02 DIAGNOSIS — R059 Cough, unspecified: Secondary | ICD-10-CM

## 2021-08-02 MED ORDER — LEVOFLOXACIN 500 MG PO TABS: 500.0000 mg | ORAL_TABLET | Freq: Every day | ORAL | 0 refills | Status: DC

## 2021-08-02 NOTE — Progress Notes (Signed)
[facility] [place], [place] [address]  Internal MEDICINE  Office Visit Note  Patient Name: Breanna Christian  [id number]  [id number]  Date of Service: 08/02/2021  Chief Complaint  Patient presents with   Shortness of Breath    Using nebulizer.  Pt started back using oxygen 2 liters last 3-4 months   Cough   Nasal Congestion    Bringing up clear, green and brown mucus   Weakness   loss of appitite    Using nutrition supplement started on friday   Wheezing    some    HPI Patient is being seen for acute in the setting she is with her son she does not speak [place] and the son is translating from [place] language. -Patient has a history of MAI and is on triple therapy for treatment and suppression. - Recently she visited [place] and at the end of the trip she started having more cough and congestion.  She is also having increased shortness of breath has to use her oxygen -Her sputum is sticky and it is hard to cough up the sputum, she is also having difficulty sleeping at night due to cough -Patient has decreased appetite and decreased p.o. intake she has lost almost 15 pounds over the last 4 months -Her spirometry is worse from before FEV1 is severely decreased patient is also tachycardic here in the office Current Medication: Outpatient Encounter Medications as of 08/02/2021  Medication Sig   levofloxacin (LEVAQUIN) 500 MG tablet Take 1 tablet (500 mg total) by mouth daily.   ARTIFICIAL TEAR OP Place 1 drop into both eyes every 6 (six) hours as needed (dry eyes). A product from [place].   azithromycin (ZITHROMAX) 250 MG tablet TAKE 1 TABLET BY MOUTH EVERY DAY   clotrimazole-betamethasone (LOTRISONE) cream APPLY TO AFFECTED AREA TWICE A DAY   ethambutol (MYAMBUTOL) 400 MG tablet TAKE 1 TABLET BY MOUTH EVERY DAY   ibandronate (BONIVA) 150 MG tablet TAKE 1 TABLET (150 MG TOTAL) BY MOUTH EVERY 30 (THIRTY) DAYS. TAKE IN THE MORNING WITH A FULL GLASS OF WATER,  ON AN EMPTY STOMACH, AND DO NOT TAKE ANYTHING ELSE BY MOUTH OR LIE DOWN FOR THE NEXT 30 MIN.   ibuprofen (ADVIL,MOTRIN) 200 MG tablet Take 200 mg by mouth as needed.   ipratropium-albuterol (DUONEB) 0.5-2.5 (3) MG/3ML SOLN Take 3 mLs by nebulization every 2 (two) hours as needed. DX: J44.9   Lifitegrast 5 % SOLN Apply 1 drop to eye in the morning and at bedtime. 1 Gtt both eyes twice a day   Multiple Vitamin (MULTI-VITAMINS) TABS Take 1 tablet by mouth daily. Reported on 04/25/2016   Omega-3 Fatty Acids (OMEGA-3 FISH OIL) 300 MG CAPS Take by mouth daily.   PROAIR HFA 108 (90 Base) MCG/ACT inhaler INHALE 2 PUFFS EVERY 6 HOURS AS NEEDED   rifampin (RIFADIN) 150 MG capsule TAKE 1 CAPSULE BY MOUTH EVERY DAY   sodium chloride HYPERTONIC 3 % nebulizer solution Take by nebulization as needed for other.   tiotropium (SPIRIVA HANDIHALER) 18 MCG inhalation capsule INHALE 1 CAPSULE VIA HANDIHALER ONCE DAILY   No facility-administered encounter medications on file as of 08/02/2021.    Surgical History: Past Surgical History:  Procedure Laterality Date   APPENDECTOMY     BRONCHOSCOPY  02/2016   CATARACT EXTRACTION W/PHACO Right 10/26/2016   Procedure: CATARACT EXTRACTION PHACO AND INTRAOCULAR LENS PLACEMENT ([place]);  Surgeon: Estill Cotta, MD;  Location: [facility] ORS;  Service: Ophthalmology;  Laterality: Right;  [place] 2.17 AP% 26.5 CDE 56.86 Fluid pack lot # [id number]   FLEXIBLE BRONCHOSCOPY Bilateral 03/16/2016   Procedure: FLEXIBLE BRONCHOSCOPY;  Surgeon: Allyne Gee, MD;  Location: [facility] ORS;  Service: Pulmonary;  Laterality: Bilateral;   FLEXIBLE BRONCHOSCOPY N/A 03/03/2017   Procedure: FLEXIBLE BRONCHOSCOPY;  Surgeon: Allyne Gee, MD;  Location: [facility] ORS;  Service: Pulmonary;  Laterality: N/A;   TUBAL LIGATION      Medical History: Past Medical History:  Diagnosis Date   Bronchiectasis ([place]) 2011   COPD (chronic obstructive pulmonary disease) (HCC)    HOH (hard of hearing)    Hypertension     Lung nodules    Mycobacterium avium complex colonization    Non-Hodgkin lymphoma ([place]) 2003   Personal history of chemotherapy    Pneumonia    FREQUENT IN PAST    H/O MYCOBACTERIAL   Rheumatoid arthritis (HCC)    Sjogren's syndrome (HCC)    Xerostomia     Family History: Family History  Problem Relation Age of Onset   Cancer Sister    Breast cancer Neg Hx     Social History   Socioeconomic History   Marital status: Married    Spouse name: Not on file   Number of children: Not on file   Years of education: Not on file   Highest education level: Not on file  Occupational History   Not on file  Tobacco Use   Smoking status: Never   Smokeless tobacco: Never  Vaping Use   Vaping Use: Never used  Substance and Sexual Activity   Alcohol use: No   Drug use: No   Sexual activity: Not on file  Other Topics Concern   Not on file  Social History Narrative   Not on file   Social Determinants of Health   Financial Resource Strain: Not on file  Food Insecurity: Not on file  Transportation Needs: Not on file  Physical Activity: Not on file  Stress: Not on file  Social Connections: Not on file  Intimate Partner Violence: Not on file      Review of Systems  Constitutional:  Positive for unexpected weight change. Negative for fatigue and fever.  HENT:  Negative for congestion, mouth sores and postnasal drip.   Respiratory:  Positive for cough and shortness of breath.   Cardiovascular:  Positive for palpitations. Negative for chest pain.  Genitourinary:  Negative for flank pain.  Psychiatric/Behavioral: Negative.     Vital Signs: BP 112/70   Pulse (!) 118   Temp 98.3 F (36.8 C)   Resp 16   Ht _0  (1.549 m)   Wt 114 lb 6.4 oz (51.9 kg)   SpO2 97% Comment: 2 liters  BMI 21.62 kg/m    Physical Exam Constitutional:      Appearance: Normal appearance.  HENT:     Head: Normocephalic and atraumatic.     Nose: Nose normal.     Mouth/Throat:     Mouth: Mucous  membranes are moist.     Pharynx: No posterior oropharyngeal erythema.  Eyes:     Extraocular Movements: Extraocular movements intact.     Pupils: Pupils are equal, round, and reactive to light.  Cardiovascular:     Pulses: Normal pulses.     Heart sounds: Normal heart sounds.  Pulmonary:     Effort: Pulmonary effort is normal.     Breath sounds: Examination of the right-middle field reveals decreased breath sounds. Decreased breath sounds and rhonchi present.  Neurological:  General: No focal deficit present.     Mental Status: She is alert.  Psychiatric:        Mood and Affect: Mood normal.        Behavior: Behavior normal.       ASSESSMENT AND PLAN 1. SOB (shortness of breath) Baseline MI infection patient has worsening shortness of breath has been using her oxygen on 2 L during the day Will need further evaluation with chest x-ray - Spirometry with Graph - DG Chest 2 View; Future Her chest x-ray report was called in patient does have pneumonia of right middle lobe she will be started on Levaquin 500 mg once a day for 10 days and will need a follow-up  2. Cough Patient is given samples of Breztri  2 puffs twice a day, continue Spiriva as before she is also on nebulizer patient will hold on her albuterol inhaler for now - DG Chest 2 View; Future  3. MAI  Patient has been on antibiotics triple therapy for suppression for Mycobacterium AVM intracellular infection, will hold azithromycin for now continue ethambutol and rifampin as before  4. Weight loss observed on examination Patient has significant weight loss, will address this after her pneumonia is better patient will also need a chest x-ray for follow-up General Counseling: Breanna Christian verbalizes understanding of the findings of todays visit and agrees with plan of treatment. I have discussed any further diagnostic evaluation that may be needed or ordered today. We also reviewed her medications today. she has been  encouraged to call the office with any questions or concerns that should arise related to todays visit.    Orders Placed This Encounter  Procedures   DG Chest 2 View   Spirometry with Graph    Meds ordered this encounter  Medications   levofloxacin (LEVAQUIN) 500 MG tablet    Sig: Take 1 tablet (500 mg total) by mouth daily.    Dispense:  10 tablet    Refill:  0    Total time spent:35 Minutes Time spent includes review of chart, medications, test results, and follow up plan with the patient.   [place] Controlled Substance Database was reviewed by me.   Dr Lavera Guise Internal medicine

## 2021-08-11 ENCOUNTER — Ambulatory Visit: Payer: PRIVATE HEALTH INSURANCE | Admitting: Nurse Practitioner

## 2021-08-20 ENCOUNTER — Other Ambulatory Visit: Payer: Self-pay | Admitting: Internal Medicine

## 2021-08-20 DIAGNOSIS — A310 Pulmonary mycobacterial infection: Secondary | ICD-10-CM

## 2021-08-23 ENCOUNTER — Ambulatory Visit (INDEPENDENT_AMBULATORY_CARE_PROVIDER_SITE_OTHER): Payer: Medicare Other | Admitting: Internal Medicine

## 2021-08-23 ENCOUNTER — Other Ambulatory Visit: Payer: Self-pay

## 2021-08-23 ENCOUNTER — Encounter: Payer: Self-pay | Admitting: Internal Medicine

## 2021-08-23 VITALS — BP 124/76 | HR 83 | Temp 98.3°F | Resp 16 | Ht 61.0 in | Wt 117.6 lb

## 2021-08-23 DIAGNOSIS — A310 Pulmonary mycobacterial infection: Secondary | ICD-10-CM

## 2021-08-23 DIAGNOSIS — J449 Chronic obstructive pulmonary disease, unspecified: Secondary | ICD-10-CM

## 2021-08-23 DIAGNOSIS — J181 Lobar pneumonia, unspecified organism: Secondary | ICD-10-CM | POA: Diagnosis not present

## 2021-08-23 DIAGNOSIS — R053 Chronic cough: Secondary | ICD-10-CM

## 2021-08-23 MED ORDER — AZITHROMYCIN 250 MG PO TABS: 250.0000 mg | ORAL_TABLET | Freq: Every day | ORAL | 1 refills | Status: DC

## 2021-08-23 NOTE — Progress Notes (Signed)
[facility] [place], [place] [address]  Pulmonary Sleep Medicine   Office Visit Note  Patient Name: Breanna Christian DOB: 08/18/1941 MRN [id number]  Date of Service: 08/23/2021  Complaints/HPI: Cough SOB Pneumonia patient was seen for cough congestion by primary care team.  She had been back on oxygen also.  She was bringing up sputum which was green.  Patient had significant increase in shortness of breath noted also.  Currently she states that she is feeling much better after she was given a round of azithromycin.  She states her shortness of breath has improved.  She was asking about going back on her regular medications and told her that it is okay to continue this so.  Also suggested getting a follow-up chest x-ray.  Currently denies having any fevers and no chills.  No chest pain is noted.  ROS  General: (-) fever, (-) chills, (-) night sweats, (-) weakness Skin: (-) rashes, (-) itching,. Eyes: (-) visual changes, (-) redness, (-) itching. Nose and Sinuses: (-) nasal stuffiness or itchiness, (-) postnasal drip, (-) nosebleeds, (-) sinus trouble. Mouth and Throat: (-) sore throat, (-) hoarseness. Neck: (-) swollen glands, (-) enlarged thyroid, (-) neck pain. Respiratory: + cough, (-) bloody sputum, + shortness of breath, - wheezing. Cardiovascular: - ankle swelling, (-) chest pain. Lymphatic: (-) lymph node enlargement. Neurologic: (-) numbness, (-) tingling. Psychiatric: (-) anxiety, (-) depression   Current Medication: Outpatient Encounter Medications as of 08/23/2021  Medication Sig   ARTIFICIAL TEAR OP Place 1 drop into both eyes every 6 (six) hours as needed (dry eyes). A product from [place].   azithromycin (ZITHROMAX) 250 MG tablet TAKE 1 TABLET BY MOUTH EVERY DAY   clotrimazole-betamethasone (LOTRISONE) cream APPLY TO AFFECTED AREA TWICE A DAY   ethambutol (MYAMBUTOL) 400 MG tablet TAKE 1 TABLET BY MOUTH EVERY DAY   ibandronate (BONIVA) 150  MG tablet TAKE 1 TABLET (150 MG TOTAL) BY MOUTH EVERY 30 (THIRTY) DAYS. TAKE IN THE MORNING WITH A FULL GLASS OF WATER, ON AN EMPTY STOMACH, AND DO NOT TAKE ANYTHING ELSE BY MOUTH OR LIE DOWN FOR THE NEXT 30 MIN.   ibuprofen (ADVIL,MOTRIN) 200 MG tablet Take 200 mg by mouth as needed.   ipratropium-albuterol (DUONEB) 0.5-2.5 (3) MG/3ML SOLN Take 3 mLs by nebulization every 2 (two) hours as needed. DX: J44.9   Lifitegrast 5 % SOLN Apply 1 drop to eye in the morning and at bedtime. 1 Gtt both eyes twice a day   Multiple Vitamin (MULTI-VITAMINS) TABS Take 1 tablet by mouth daily. Reported on 04/25/2016   Omega-3 Fatty Acids (OMEGA-3 FISH OIL) 300 MG CAPS Take by mouth daily.   PROAIR HFA 108 (90 Base) MCG/ACT inhaler INHALE 2 PUFFS EVERY 6 HOURS AS NEEDED   rifampin (RIFADIN) 150 MG capsule TAKE 1 CAPSULE BY MOUTH EVERY DAY   sodium chloride HYPERTONIC 3 % nebulizer solution Take by nebulization as needed for other.   tiotropium (SPIRIVA HANDIHALER) 18 MCG inhalation capsule INHALE 1 CAPSULE VIA HANDIHALER ONCE DAILY   [DISCONTINUED] levofloxacin (LEVAQUIN) 500 MG tablet Take 1 tablet (500 mg total) by mouth daily. (Patient not taking: Reported on 08/23/2021)   No facility-administered encounter medications on file as of 08/23/2021.    Surgical History: Past Surgical History:  Procedure Laterality Date   APPENDECTOMY     BRONCHOSCOPY  02/2016   CATARACT EXTRACTION W/PHACO Right 10/26/2016   Procedure: CATARACT EXTRACTION PHACO AND INTRAOCULAR LENS PLACEMENT ([place]);  Surgeon: Estill Cotta, MD;  Location: [facility];  Service: Ophthalmology;  Laterality: Right;  [place] 2.17 AP% 26.5 CDE 56.86 Fluid pack lot # [id number]   FLEXIBLE BRONCHOSCOPY Bilateral 03/16/2016   Procedure: FLEXIBLE BRONCHOSCOPY;  Surgeon: Allyne Gee, MD;  Location: [facility] OR[facility];  Service: Pulmonary;  Laterality: Bilateral;   FLEXIBLE BRONCHOSCOPY N/A 03/03/2017   Procedure: FLEXIBLE BRONCHOSCOPY;  Surgeon: Allyne Gee, MD;   Location: [facility] ORS;  Service: Pulmonary;  Laterality: N/A;   TUBAL LIGATION      Medical History: Past Medical History:  Diagnosis Date   Bronchiectasis ([place]) 2011   COPD (chronic obstructive pulmonary disease) (HCC)    HOH (hard of hearing)    Hypertension    Lung nodules    Mycobacterium avium complex colonization    Non-Hodgkin lymphoma ([place]) 2003   Personal history of chemotherapy    Pneumonia    FREQUENT IN PAST    H/O MYCOBACTERIAL   Rheumatoid arthritis (HCC)    Sjogren's syndrome (HCC)    Xerostomia     Family History: Family History  Problem Relation Age of Onset   Cancer Sister    Breast cancer Neg Hx     Social History: Social History   Socioeconomic History   Marital status: Married    Spouse name: Not on file   Number of children: Not on file   Years of education: Not on file   Highest education level: Not on file  Occupational History   Not on file  Tobacco Use   Smoking status: Never   Smokeless tobacco: Never  Vaping Use   Vaping Use: Never used  Substance and Sexual Activity   Alcohol use: No   Drug use: No   Sexual activity: Not on file  Other Topics Concern   Not on file  Social History Narrative   Not on file   Social Determinants of Health   Financial Resource Strain: Not on file  Food Insecurity: Not on file  Transportation Needs: Not on file  Physical Activity: Not on file  Stress: Not on file  Social Connections: Not on file  Intimate Partner Violence: Not on file    Vital Signs: Blood pressure 124/76, pulse 83, temperature 98.3 F (36.8 C), resp. rate 16, height _0  (1.549 m), weight 117 lb 9.6 oz (53.3 kg), SpO2 97 %.  Examination: General Appearance: The patient is well-developed, well-nourished, and in no distress. Skin: Gross inspection of skin unremarkable. Head: normocephalic, no gross deformities. Eyes: no gross deformities noted. ENT: ears appear grossly normal no exudates. Neck: Supple. No thyromegaly. No  LAD. Respiratory: Coarse rhonchi are noted at this time.. Cardiovascular: Normal S1 and S2 without murmur or rub. Extremities: No cyanosis. pulses are equal. Neurologic: Alert and oriented. No involuntary movements.  LABS: No results found for this or any previous visit (from the past 2160 hour(s)).  Radiology: DG Chest 2 View  Result Date: 08/02/2021 CLINICAL DATA:  Cough, productive cough for 2 weeks. EXAM: CHEST - 2 VIEW COMPARISON:  Radiograph 03/12/2018 FINDINGS: Normal mediastinum. Atherosclerotic calcification of the aorta. Lungs are hyperinflated. There is focus of consolidation within the lateral aspect of the RIGHT middle lobe. Underlying chronic appearing interstitial thickening elsewhere within the lungs. No pneumothorax. No acute osseous abnormality. IMPRESSION: Findings most consistent with RIGHT middle lobe pneumonia. Followup PA and lateral chest X-ray is recommended in 3-4 weeks following trial of antibiotic therapy to ensure resolution and exclude underlying malignancy. These results will be called to the ordering clinician or representative by  the Psychologist, clinical, and communication documented in the PACS or [employer]. Electronically Signed   By: Suzy Bouchard M.D.   On: 08/02/2021 12:20    No results found.  DG Chest 2 View  Result Date: 08/02/2021 CLINICAL DATA:  Cough, productive cough for 2 weeks. EXAM: CHEST - 2 VIEW COMPARISON:  Radiograph 03/12/2018 FINDINGS: Normal mediastinum. Atherosclerotic calcification of the aorta. Lungs are hyperinflated. There is focus of consolidation within the lateral aspect of the RIGHT middle lobe. Underlying chronic appearing interstitial thickening elsewhere within the lungs. No pneumothorax. No acute osseous abnormality. IMPRESSION: Findings most consistent with RIGHT middle lobe pneumonia. Followup PA and lateral chest X-ray is recommended in 3-4 weeks following trial of antibiotic therapy to ensure resolution and exclude  underlying malignancy. These results will be called to the ordering clinician or representative by the Radiologist Assistant, and communication documented in the PACS or [employer]. Electronically Signed   By: Suzy Bouchard M.D.   On: 08/02/2021 12:20      Assessment and Plan: Patient Active Problem List   Diagnosis Date Noted   Encounter for general adult medical examination with abnormal findings 08/08/2020   Other fatigue 08/08/2020   Need for vaccination against Streptococcus pneumoniae using pneumococcal conjugate vaccine 7 08/08/2020   Age related osteoporosis 10/15/2018   Ovarian failure 10/15/2018   Screening for breast cancer 04/29/2018   Essential hypertension 04/29/2018   Obstructive chronic bronchitis without exacerbation ([place]) 04/29/2018   MAI (mycobacterium avium-intracellulare) ([place]) 02/08/2018   Numbness and tingling 03/25/2015   Myopathy 02/19/2015   Polyneuropathy 02/18/2015   Weakness of both lower extremities 02/18/2015    1. MAI (mycobacterium avium-intracellulare) infection ([place]) Recommended that she go back on her therapy she was on azithromycin and should go ahead and restart that. - azithromycin (ZITHROMAX) 250 MG tablet; Take 1 tablet (250 mg total) by mouth daily.  Dispense: 90 tablet; Refill: 1  2. Chronic cough Slowly improving we will continue to monitor closely.  3. MAIC (mycobacterium avium-intracellulare complex) ([place]) She should go back on her regular treatment for the MAI - DG Chest 2 View; Future  4. Chronic obstructive pulmonary disease, unspecified COPD type ([place]) Severe disease she is off her oxygen again after the acute exacerbation of her pneumonia.  5. Lobar pneumonia, unspecified organism ([place]) Treated with Levaquin we will go back on her regular medications  General Counseling: I have discussed the findings of the evaluation and examination with Bennetta.  I have also discussed any further diagnostic evaluation thatmay be  needed or ordered today. Katharyn verbalizes understanding of the findings of todays visit. We also reviewed her medications today and discussed drug interactions and side effects including but not limited excessive drowsiness and altered mental states. We also discussed that there is always a risk not just to her but also people around her. she has been encouraged to call the office with any questions or concerns that should arise related to todays visit.  Orders Placed This Encounter  Procedures   DG Chest 2 View    Standing Status:   Future    Number of Occurrences:   1    Standing Expiration Date:   08/23/2022    Order Specific Question:   Reason for Exam (SYMPTOM  OR DIAGNOSIS REQUIRED)    Answer:   pneumonia    Order Specific Question:   Preferred imaging location?    Answer:   [facility]     Time spent: 44  I have personally  obtained a history, examined the patient, evaluated laboratory and imaging results, formulated the assessment and plan and placed orders.    Allyne Gee, MD [facility] Pulmonary and Critical Care Sleep medicine

## 2021-08-23 NOTE — Patient Instructions (Signed)

## 2021-08-23 NOTE — Telephone Encounter (Signed)
Please check with DSK

## 2021-08-26 ENCOUNTER — Telehealth: Payer: Self-pay

## 2021-08-26 NOTE — Telephone Encounter (Signed)
Left vm to confirm 08/30/21 appointment-Toni

## 2021-08-30 ENCOUNTER — Encounter: Payer: Self-pay | Admitting: Internal Medicine

## 2021-08-30 ENCOUNTER — Ambulatory Visit
Admission: RE | Admit: 2021-08-30 | Discharge: 2021-08-30 | Disposition: A | Discharge status: Home / Self Care | Payer: Medicare Other | Source: Ambulatory Visit | Attending: Internal Medicine | Admitting: Internal Medicine

## 2021-08-30 ENCOUNTER — Other Ambulatory Visit: Payer: Self-pay

## 2021-08-30 ENCOUNTER — Ambulatory Visit (INDEPENDENT_AMBULATORY_CARE_PROVIDER_SITE_OTHER): Payer: Medicare Other | Admitting: Internal Medicine

## 2021-08-30 ENCOUNTER — Ambulatory Visit
Admission: RE | Admit: 2021-08-30 | Discharge: 2021-08-30 | Disposition: A | Discharge status: Home / Self Care | Payer: Medicare Other | Attending: Internal Medicine | Admitting: Internal Medicine

## 2021-08-30 DIAGNOSIS — Z0001 Encounter for general adult medical examination with abnormal findings: Secondary | ICD-10-CM | POA: Diagnosis not present

## 2021-08-30 DIAGNOSIS — R918 Other nonspecific abnormal finding of lung field: Secondary | ICD-10-CM | POA: Diagnosis not present

## 2021-08-30 DIAGNOSIS — Z1231 Encounter for screening mammogram for malignant neoplasm of breast: Secondary | ICD-10-CM

## 2021-08-30 DIAGNOSIS — I700 Atherosclerosis of aorta: Secondary | ICD-10-CM

## 2021-08-30 DIAGNOSIS — A310 Pulmonary mycobacterial infection: Secondary | ICD-10-CM | POA: Diagnosis not present

## 2021-08-30 DIAGNOSIS — R300 Dysuria: Secondary | ICD-10-CM

## 2021-08-30 DIAGNOSIS — E875 Hyperkalemia: Secondary | ICD-10-CM

## 2021-08-30 DIAGNOSIS — Z8701 Personal history of pneumonia (recurrent): Secondary | ICD-10-CM | POA: Diagnosis not present

## 2021-08-30 DIAGNOSIS — M810 Age-related osteoporosis without current pathological fracture: Secondary | ICD-10-CM | POA: Diagnosis not present

## 2021-08-30 MED ORDER — IBANDRONATE SODIUM 150 MG PO TABS: 150.0000 mg | ORAL_TABLET | ORAL | 3 refills | Status: DC

## 2021-08-30 NOTE — Progress Notes (Signed)
[facility] [place], [place] [address]  Internal MEDICINE  Office Visit Note  Patient Name: Breanna Christian  [id number]  [id number]  Date of Service: 09/01/2021  Chief Complaint  Patient presents with   Medicare Wellness    Would like to review labwork   Hypertension   Quality Metric Gaps    Flu Shot + Pneumonia     HPI Pt is here for routine health maintenance examination. She is here with her son ( speaks some [place], from [place]).  Patient has history of MAI has been on triple therapy with ethambutol rifampin and Zithromax followed by pulmonary. Patient fell ill during her recent trip to [place], had dx of pneumonia after CXR she was treated with Levaquin her Spiriva was switched to [place].  She feels much better. She is able to eat more and has gained 4 pounds since last visit. Patient also has history of osteoporosis and on Boniva. Patient also has hyperkalemia and has been followed by nephrology   Current Medication: Outpatient Encounter Medications as of 08/30/2021  Medication Sig   ARTIFICIAL TEAR OP Place 1 drop into both eyes every 6 (six) hours as needed (dry eyes). A product from [place].   azithromycin (ZITHROMAX) 250 MG tablet Take 1 tablet (250 mg total) by mouth daily.   clotrimazole-betamethasone (LOTRISONE) cream APPLY TO AFFECTED AREA TWICE A DAY   ethambutol (MYAMBUTOL) 400 MG tablet TAKE 1 TABLET BY MOUTH EVERY DAY   ibuprofen (ADVIL,MOTRIN) 200 MG tablet Take 200 mg by mouth as needed.   ipratropium-albuterol (DUONEB) 0.5-2.5 (3) MG/3ML SOLN Take 3 mLs by nebulization every 2 (two) hours as needed. DX: J44.9   Lifitegrast 5 % SOLN Apply 1 drop to eye in the morning and at bedtime. 1 Gtt both eyes twice a day   Multiple Vitamin (MULTI-VITAMINS) TABS Take 1 tablet by mouth daily. Reported on 04/25/2016   Omega-3 Fatty Acids (OMEGA-3 FISH OIL) 300 MG CAPS Take by mouth daily.   PROAIR HFA 108 (90 Base) MCG/ACT inhaler INHALE 2  PUFFS EVERY 6 HOURS AS NEEDED   rifampin (RIFADIN) 150 MG capsule TAKE 1 CAPSULE BY MOUTH EVERY DAY   sodium chloride HYPERTONIC 3 % nebulizer solution Take by nebulization as needed for other.   tiotropium (SPIRIVA HANDIHALER) 18 MCG inhalation capsule INHALE 1 CAPSULE VIA HANDIHALER ONCE DAILY   [DISCONTINUED] ibandronate (BONIVA) 150 MG tablet TAKE 1 TABLET (150 MG TOTAL) BY MOUTH EVERY 30 (THIRTY) DAYS. TAKE IN THE MORNING WITH A FULL GLASS OF WATER, ON AN EMPTY STOMACH, AND DO NOT TAKE ANYTHING ELSE BY MOUTH OR LIE DOWN FOR THE NEXT 30 MIN.   ibandronate (BONIVA) 150 MG tablet Take 1 tablet (150 mg total) by mouth every 30 (thirty) days. Take in the morning with a full glass of water, on an empty stomach, and do not take anything else by mouth or lie down for the next 30 min.   [DISCONTINUED] ibandronate (BONIVA) 150 MG tablet Take 1 tablet (150 mg total) by mouth every 30 (thirty) days. Take in the morning with a full glass of water, on an empty stomach, and do not take anything else by mouth or lie down for the next 30 min.   No facility-administered encounter medications on file as of 08/30/2021.    Surgical History: Past Surgical History:  Procedure Laterality Date   APPENDECTOMY     BRONCHOSCOPY  02/2016   CATARACT EXTRACTION W/PHACO Right 10/26/2016   Procedure: CATARACT EXTRACTION PHACO  AND INTRAOCULAR LENS PLACEMENT (IOC);  Surgeon: Estill Cotta, MD;  Location: [facility] ORS;  Service: Ophthalmology;  Laterality: Right;  [place] 2.17 AP% 26.5 CDE 56.86 Fluid pack lot # [id number]   FLEXIBLE BRONCHOSCOPY Bilateral 03/16/2016   Procedure: FLEXIBLE BRONCHOSCOPY;  Surgeon: Allyne Gee, MD;  Location: [facility] ORS;  Service: Pulmonary;  Laterality: Bilateral;   FLEXIBLE BRONCHOSCOPY N/A 03/03/2017   Procedure: FLEXIBLE BRONCHOSCOPY;  Surgeon: Allyne Gee, MD;  Location: [facility] ORS;  Service: Pulmonary;  Laterality: N/A;   TUBAL LIGATION      Medical History: Past Medical History:  Diagnosis  Date   Bronchiectasis ([place]) 2011   COPD (chronic obstructive pulmonary disease) (HCC)    HOH (hard of hearing)    Hypertension    Lung nodules    Mycobacterium avium complex colonization    Non-Hodgkin lymphoma ([place]) 2003   Personal history of chemotherapy    Pneumonia    FREQUENT IN PAST    H/O MYCOBACTERIAL   Rheumatoid arthritis (HCC)    Sjogren's syndrome (HCC)    Xerostomia     Family History: Family History  Problem Relation Age of Onset   Cancer Sister    Breast cancer Neg Hx     Social History: Social History   Socioeconomic History   Marital status: Married    Spouse name: Not on file   Number of children: Not on file   Years of education: Not on file   Highest education level: Not on file  Occupational History   Not on file  Tobacco Use   Smoking status: Never   Smokeless tobacco: Never  Vaping Use   Vaping Use: Never used  Substance and Sexual Activity   Alcohol use: No   Drug use: No   Sexual activity: Not on file  Other Topics Concern   Not on file  Social History Narrative   Not on file   Social Determinants of Health   Financial Resource Strain: Not on file  Food Insecurity: Not on file  Transportation Needs: Not on file  Physical Activity: Not on file  Stress: Not on file  Social Connections: Not on file      Review of Systems  Constitutional:  Negative for chills, fatigue and unexpected weight change.  HENT:  Negative for congestion, postnasal drip, rhinorrhea, sneezing and sore throat.   Eyes:  Negative for redness.  Respiratory:  Negative for cough, chest tightness and shortness of breath.   Cardiovascular:  Negative for chest pain and palpitations.  Gastrointestinal:  Negative for abdominal pain, constipation, diarrhea, nausea and vomiting.  Genitourinary:  Negative for dysuria and frequency.  Musculoskeletal:  Negative for arthralgias, back pain, joint swelling and neck pain.  Skin:  Negative for rash.  Neurological: Negative.   Negative for tremors and numbness.  Hematological:  Negative for adenopathy. Does not bruise/bleed easily.  Psychiatric/Behavioral:  Negative for behavioral problems (Depression), sleep disturbance and suicidal ideas. The patient is not nervous/anxious.     Vital Signs: BP 127/76   Pulse 82   Temp 98.8 F (37.1 C)   Resp 16   Ht _0  (1.549 m)   Wt 118 lb (53.5 kg)   SpO2 98%   BMI 22.30 kg/m    Physical Exam Constitutional:      Appearance: Normal appearance.  HENT:     Head: Normocephalic and atraumatic.     Nose: Nose normal.     Mouth/Throat:     Mouth: Mucous membranes are moist.  Pharynx: No posterior oropharyngeal erythema.  Eyes:     Extraocular Movements: Extraocular movements intact.     Pupils: Pupils are equal, round, and reactive to light.  Cardiovascular:     Pulses: Normal pulses.     Heart sounds: Normal heart sounds.  Pulmonary:     Effort: Pulmonary effort is normal.     Breath sounds: Normal breath sounds.  Neurological:     General: No focal deficit present.     Mental Status: She is alert.  Psychiatric:        Mood and Affect: Mood normal.        Behavior: Behavior normal.     Assessment/Plan: 1. Encounter for general adult medical examination with abnormal findings Patient is up-to-date on health preventive maintenance  2. Age related osteoporosis, unspecified pathological fracture presence Continued As before - ibandronate (BONIVA) 150 MG tablet; Take 1 tablet (150 mg total) by mouth every 30 (thirty) days. Take in the morning with a full glass of water, on an empty stomach, and do not take anything else by mouth or lie down for the next 30 min.  Dispense: 3 tablet; Refill: 3  3. MAI (mycobacterium avium-intracellulare) infection ([place]) Patient is actively followed by pulmonary, she is on triple medications ethambutol, rifampin and azithromycin including Spiriva recent chest x-ray is suggestive of chronic bacterial infection might need a  CT chest  4. Aortic atherosclerosis (HCC) Recent chest x-ray did show atherosclerosis, normal lipid profile we will discuss with her family on next visit  5. Hyperkalemia Chronic hyperkalemia was treated with [place], followed by nephrology  6. Visit for screening mammogram - MM 3D SCREEN BREAST BILATERAL; Future  7. Dysuria - UA/M w/rflx Culture, Routine   General Counseling: [place] verbalizes understanding of the findings of todays visit and agrees with plan of treatment. I have discussed any further diagnostic evaluation that may be needed or ordered today. We also reviewed her medications today. she has been encouraged to call the office with any questions or concerns that should arise related to todays visit.    Counseling:  [place] Controlled Substance Database was reviewed by me.  Orders Placed This Encounter  Procedures   Microscopic Examination   UA/M w/rflx Culture, Routine    Meds ordered this encounter  Medications   DISCONTD: ibandronate (BONIVA) 150 MG tablet    Sig: Take 1 tablet (150 mg total) by mouth every 30 (thirty) days. Take in the morning with a full glass of water, on an empty stomach, and do not take anything else by mouth or lie down for the next 30 min.    Dispense:  3 tablet    Refill:  3    DX Code Needed  .   ibandronate (BONIVA) 150 MG tablet    Sig: Take 1 tablet (150 mg total) by mouth every 30 (thirty) days. Take in the morning with a full glass of water, on an empty stomach, and do not take anything else by mouth or lie down for the next 30 min.    Dispense:  3 tablet    Refill:  3    Total time spent:35 Minutes  Time spent includes review of chart, medications, test results, and follow up plan with the patient.     Lavera Guise, MD  Internal Medicine

## 2021-08-31 LAB — UA/M W/RFLX CULTURE, ROUTINE
Bilirubin, UA: NEGATIVE
Glucose, UA: NEGATIVE
Ketones, UA: NEGATIVE
Leukocytes,UA: NEGATIVE
Nitrite, UA: NEGATIVE
Protein,UA: NEGATIVE
RBC, UA: NEGATIVE
Specific Gravity, UA: 1.015 (ref 1.005–1.030)
Urobilinogen, Ur: 0.2 mg/dL (ref 0.2–1.0)
pH, UA: 6.5 (ref 5.0–7.5)

## 2021-08-31 LAB — MICROSCOPIC EXAMINATION
Bacteria, UA: NONE SEEN
Casts: NONE SEEN /lpf
Epithelial Cells (non renal): NONE SEEN /hpf (ref 0–10)
RBC, Urine: NONE SEEN /hpf (ref 0–2)
WBC, UA: NONE SEEN /hpf (ref 0–5)

## 2021-09-01 MED ORDER — IBANDRONATE SODIUM 150 MG PO TABS: 150.0000 mg | ORAL_TABLET | ORAL | 3 refills | Status: DC

## 2021-09-06 ENCOUNTER — Ambulatory Visit: Payer: Medicare Other | Admitting: Internal Medicine

## 2021-09-15 DIAGNOSIS — Z23 Encounter for immunization: Secondary | ICD-10-CM | POA: Diagnosis not present

## 2021-09-30 ENCOUNTER — Encounter (HOSPITAL_COMMUNITY): Payer: Self-pay | Admitting: Radiology

## 2021-10-11 ENCOUNTER — Encounter: Payer: Self-pay | Admitting: Internal Medicine

## 2021-11-17 ENCOUNTER — Telehealth: Payer: Self-pay | Admitting: Internal Medicine

## 2021-11-17 NOTE — Chronic Care Management (AMB) (Signed)
°  Chronic Care Management   Note  11/17/2021 Name: Breanna Christian MRN: [id number] DOB: 1941-07-21  Breanna Christian is a 80 y.o. year old female who is a primary care patient of Lavera Guise, MD. I reached out to Breanna Christian by phone today in response to a referral sent by Breanna Christian's PCP, Lavera Guise, MD.   Breanna Christian was given information about Chronic Care Management services today including:  CCM service includes personalized support from designated clinical staff supervised by her physician, including individualized plan of care and coordination with other care providers 24/7 contact phone numbers for assistance for urgent and routine care needs. Service will only be billed when office clinical staff spend 20 minutes or more in a month to coordinate care. Only one practitioner may furnish and bill the service in a calendar month. The patient may stop CCM services at any time (effective at the end of the month) by phone call to the office staff.   Breanna Christian/SON  verbally agreed to assistance and services provided by embedded care coordination/care management team today.  Follow up plan: [place]

## 2021-11-29 ENCOUNTER — Ambulatory Visit (INDEPENDENT_AMBULATORY_CARE_PROVIDER_SITE_OTHER): Payer: Medicare Other | Admitting: Physician Assistant

## 2021-11-29 ENCOUNTER — Other Ambulatory Visit: Payer: Self-pay

## 2021-11-29 ENCOUNTER — Encounter: Payer: Self-pay | Admitting: Physician Assistant

## 2021-11-29 VITALS — BP 126/70 | HR 91 | Temp 97.8°F | Resp 16 | Ht 61.0 in | Wt 127.0 lb

## 2021-11-29 DIAGNOSIS — A310 Pulmonary mycobacterial infection: Secondary | ICD-10-CM | POA: Diagnosis not present

## 2021-11-29 DIAGNOSIS — Z0001 Encounter for general adult medical examination with abnormal findings: Secondary | ICD-10-CM

## 2021-11-29 DIAGNOSIS — J449 Chronic obstructive pulmonary disease, unspecified: Secondary | ICD-10-CM | POA: Diagnosis not present

## 2021-11-29 DIAGNOSIS — J9611 Chronic respiratory failure with hypoxia: Secondary | ICD-10-CM | POA: Diagnosis not present

## 2021-11-29 DIAGNOSIS — E875 Hyperkalemia: Secondary | ICD-10-CM | POA: Diagnosis not present

## 2021-11-29 DIAGNOSIS — I700 Atherosclerosis of aorta: Secondary | ICD-10-CM | POA: Diagnosis not present

## 2021-11-29 DIAGNOSIS — Z1231 Encounter for screening mammogram for malignant neoplasm of breast: Secondary | ICD-10-CM

## 2021-11-29 DIAGNOSIS — M810 Age-related osteoporosis without current pathological fracture: Secondary | ICD-10-CM

## 2021-11-29 DIAGNOSIS — R300 Dysuria: Secondary | ICD-10-CM

## 2021-11-29 MED ORDER — RIFAMPIN 150 MG PO CAPS: ORAL_CAPSULE | ORAL | 1 refills | Status: DC

## 2021-11-29 MED ORDER — IBANDRONATE SODIUM 150 MG PO TABS: 150.0000 mg | ORAL_TABLET | ORAL | 3 refills | Status: DC

## 2021-11-29 MED ORDER — AZITHROMYCIN 250 MG PO TABS: 250.0000 mg | ORAL_TABLET | Freq: Every day | ORAL | 1 refills | Status: DC

## 2021-11-29 MED ORDER — IPRATROPIUM-ALBUTEROL 0.5-2.5 (3) MG/3ML IN SOLN: 3.0000 mL | RESPIRATORY_TRACT | 3 refills | Status: DC | PRN

## 2021-11-29 MED ORDER — SPIRIVA HANDIHALER 18 MCG IN CAPS: ORAL_CAPSULE | RESPIRATORY_TRACT | 3 refills | Status: DC

## 2021-11-29 MED ORDER — ETHAMBUTOL HCL 400 MG PO TABS: 400.0000 mg | ORAL_TABLET | Freq: Every day | ORAL | 1 refills | Status: DC

## 2021-11-29 NOTE — Progress Notes (Signed)
[facility] [place], [place] [address]  Internal MEDICINE  Office Visit Note  Patient Name: Breanna Christian  [id number]  [id number]  Date of Service: 11/30/2021  Chief Complaint  Patient presents with   Follow-up   Hypertension    HPI  Pt is here for routine follow up with her son and has no complaints today -She is doing well, but is in need of some refills -per son they will be using [facility] now and would like everything sent there -She has her pulmonology follow up next week and states her breathing has been ok lately -Discussed that a mammogram order had been placed at CPE last visit, but has not been scheduled yet and she would like to move forward with this. Given info to set this up when ready -She is followed by nephrology for hyperkalemia -Her weight has been improving after loss due to PNA, she is up 9lbs since last visit -BP stable  Current Medication: Outpatient Encounter Medications as of 11/29/2021  Medication Sig   ARTIFICIAL TEAR OP Place 1 drop into both eyes every 6 (six) hours as needed (dry eyes). A product from [place].   clotrimazole-betamethasone (LOTRISONE) cream APPLY TO AFFECTED AREA TWICE A DAY   ibuprofen (ADVIL,MOTRIN) 200 MG tablet Take 200 mg by mouth as needed.   Lifitegrast 5 % SOLN Apply 1 drop to eye in the morning and at bedtime. 1 Gtt both eyes twice a day   Multiple Vitamin (MULTI-VITAMINS) TABS Take 1 tablet by mouth daily. Reported on 04/25/2016   Omega-3 Fatty Acids (OMEGA-3 FISH OIL) 300 MG CAPS Take by mouth daily.   PROAIR HFA 108 (90 Base) MCG/ACT inhaler INHALE 2 PUFFS EVERY 6 HOURS AS NEEDED   sodium chloride HYPERTONIC 3 % nebulizer solution Take by nebulization as needed for other.   [DISCONTINUED] azithromycin (ZITHROMAX) 250 MG tablet Take 1 tablet (250 mg total) by mouth daily.   [DISCONTINUED] ethambutol (MYAMBUTOL) 400 MG tablet TAKE 1 TABLET BY MOUTH EVERY DAY   [DISCONTINUED]  ibandronate (BONIVA) 150 MG tablet Take 1 tablet (150 mg total) by mouth every 30 (thirty) days. Take in the morning with a full glass of water, on an empty stomach, and do not take anything else by mouth or lie down for the next 30 min.   [DISCONTINUED] ipratropium-albuterol (DUONEB) 0.5-2.5 (3) MG/3ML SOLN Take 3 mLs by nebulization every 2 (two) hours as needed. DX: J44.9   [DISCONTINUED] rifampin (RIFADIN) 150 MG capsule TAKE 1 CAPSULE BY MOUTH EVERY DAY   [DISCONTINUED] tiotropium (SPIRIVA HANDIHALER) 18 MCG inhalation capsule INHALE 1 CAPSULE VIA HANDIHALER ONCE DAILY   azithromycin (ZITHROMAX) 250 MG tablet Take 1 tablet (250 mg total) by mouth daily.   ethambutol (MYAMBUTOL) 400 MG tablet Take 1 tablet (400 mg total) by mouth daily.   ibandronate (BONIVA) 150 MG tablet Take 1 tablet (150 mg total) by mouth every 30 (thirty) days. Take in the morning with a full glass of water, on an empty stomach, and do not take anything else by mouth or lie down for the next 30 min.   ipratropium-albuterol (DUONEB) 0.5-2.5 (3) MG/3ML SOLN Take 3 mLs by nebulization every 2 (two) hours as needed. DX: J44.9   rifampin (RIFADIN) 150 MG capsule TAKE 1 CAPSULE BY MOUTH EVERY DAY   tiotropium (SPIRIVA HANDIHALER) 18 MCG inhalation capsule INHALE 1 CAPSULE VIA HANDIHALER ONCE DAILY   No facility-administered encounter medications on file as of 11/29/2021.  Surgical History: Past Surgical History:  Procedure Laterality Date   APPENDECTOMY     BRONCHOSCOPY  02/2016   CATARACT EXTRACTION W/PHACO Right 10/26/2016   Procedure: CATARACT EXTRACTION PHACO AND INTRAOCULAR LENS PLACEMENT ([place]);  Surgeon: Estill Cotta, MD;  Location: [facility] OR[facility];  Service: Ophthalmology;  Laterality: Right;  [place] 2.17 AP% 26.5 CDE 56.86 Fluid pack lot # [id number]   FLEXIBLE BRONCHOSCOPY Bilateral 03/16/2016   Procedure: FLEXIBLE BRONCHOSCOPY;  Surgeon: Allyne Gee, MD;  Location: [facility] OR[facility];  Service: Pulmonary;  Laterality: Bilateral;    FLEXIBLE BRONCHOSCOPY N/A 03/03/2017   Procedure: FLEXIBLE BRONCHOSCOPY;  Surgeon: Allyne Gee, MD;  Location: [facility] OR[facility];  Service: Pulmonary;  Laterality: N/A;   TUBAL LIGATION      Medical History: Past Medical History:  Diagnosis Date   Bronchiectasis ([place]) 2011   COPD (chronic obstructive pulmonary disease) (HCC)    HOH (hard of hearing)    Hypertension    Lung nodules    Mycobacterium avium complex colonization    Non-Hodgkin lymphoma ([place]) 2003   Personal history of chemotherapy    Pneumonia    FREQUENT IN PAST    H/O MYCOBACTERIAL   Rheumatoid arthritis (HCC)    Sjogren's syndrome (HCC)    Xerostomia     Family History: Family History  Problem Relation Age of Onset   Cancer Sister    Breast cancer Neg Hx     Social History   Socioeconomic History   Marital status: Married    Spouse name: Not on file   Number of children: Not on file   Years of education: Not on file   Highest education level: Not on file  Occupational History   Not on file  Tobacco Use   Smoking status: Never   Smokeless tobacco: Never  Vaping Use   Vaping Use: Never used  Substance and Sexual Activity   Alcohol use: No   Drug use: No   Sexual activity: Not on file  Other Topics Concern   Not on file  Social History Narrative   Not on file   Social Determinants of Health   Financial Resource Strain: Not on file  Food Insecurity: Not on file  Transportation Needs: Not on file  Physical Activity: Not on file  Stress: Not on file  Social Connections: Not on file  Intimate Partner Violence: Not on file      Review of Systems  Constitutional:  Negative for chills, fatigue and unexpected weight change.  HENT:  Negative for congestion, postnasal drip, rhinorrhea, sneezing and sore throat.   Eyes:  Negative for redness.  Respiratory:  Negative for cough, chest tightness and shortness of breath.   Cardiovascular:  Negative for chest pain and palpitations.  Gastrointestinal:   Negative for abdominal pain, constipation, diarrhea, nausea and vomiting.  Genitourinary:  Negative for dysuria and frequency.  Musculoskeletal:  Negative for arthralgias, back pain, joint swelling and neck pain.  Skin:  Negative for rash.  Neurological: Negative.  Negative for tremors and numbness.  Hematological:  Negative for adenopathy. Does not bruise/bleed easily.  Psychiatric/Behavioral:  Negative for behavioral problems (Depression), sleep disturbance and suicidal ideas. The patient is not nervous/anxious.    Vital Signs: BP 126/70    Pulse 91    Temp 97.8 F (36.6 C)    Resp 16    Ht _0  (1.549 m)    Wt 127 lb (57.6 kg)    SpO2 95%    BMI 24.00 kg/m  Physical Exam Vitals and nursing note reviewed.  Constitutional:      Appearance: Normal appearance. She is normal weight.  HENT:     Head: Normocephalic and atraumatic.     Nose: Nose normal.     Mouth/Throat:     Mouth: Mucous membranes are moist.     Pharynx: No posterior oropharyngeal erythema.  Eyes:     Extraocular Movements: Extraocular movements intact.     Pupils: Pupils are equal, round, and reactive to light.  Cardiovascular:     Rate and Rhythm: Normal rate and regular rhythm.     Pulses: Normal pulses.     Heart sounds: Normal heart sounds.  Pulmonary:     Effort: Pulmonary effort is normal. No respiratory distress.     Breath sounds: Normal breath sounds. No wheezing or rales.  Musculoskeletal:        General: Normal range of motion.  Skin:    General: Skin is warm and dry.  Neurological:     General: No focal deficit present.     Mental Status: She is alert.  Psychiatric:        Mood and Affect: Mood normal.        Behavior: Behavior normal.       Assessment/Plan: 1. MAI (mycobacterium avium-intracellulare) infection ([place]) Followed by pulmonology with visit next week, refills sent - rifampin (RIFADIN) 150 MG capsule; TAKE 1 CAPSULE BY MOUTH EVERY DAY  Dispense: 90 capsule; Refill: 1 -  ethambutol (MYAMBUTOL) 400 MG tablet; Take 1 tablet (400 mg total) by mouth daily.  Dispense: 90 tablet; Refill: 1 - azithromycin (ZITHROMAX) 250 MG tablet; Take 1 tablet (250 mg total) by mouth daily.  Dispense: 90 tablet; Refill: 1  2. Obstructive chronic bronchitis without exacerbation ([place]) Followed by pulmonology, continue inhaler as before - tiotropium (SPIRIVA HANDIHALER) 18 MCG inhalation capsule; INHALE 1 CAPSULE VIA HANDIHALER ONCE DAILY  Dispense: 90 capsule; Refill: 3  3. Age related osteoporosis, unspecified pathological fracture presence Continue Boniva - ibandronate (BONIVA) 150 MG tablet; Take 1 tablet (150 mg total) by mouth every 30 (thirty) days. Take in the morning with a full glass of water, on an empty stomach, and do not take anything else by mouth or lie down for the next 30 min.  Dispense: 3 tablet; Refill: 3  4. Aortic atherosclerosis (HCC) Continue fish oil   5. Hyperkalemia Followed by nephrology    General Counseling: Vaniyah verbalizes understanding of the findings of todays visit and agrees with plan of treatment. I have discussed any further diagnostic evaluation that may be needed or ordered today. We also reviewed her medications today. she has been encouraged to call the office with any questions or concerns that should arise related to todays visit.    No orders of the defined types were placed in this encounter.   Meds ordered this encounter  Medications   rifampin (RIFADIN) 150 MG capsule    Sig: TAKE 1 CAPSULE BY MOUTH EVERY DAY    Dispense:  90 capsule    Refill:  1   ethambutol (MYAMBUTOL) 400 MG tablet    Sig: Take 1 tablet (400 mg total) by mouth daily.    Dispense:  90 tablet    Refill:  1   azithromycin (ZITHROMAX) 250 MG tablet    Sig: Take 1 tablet (250 mg total) by mouth daily.    Dispense:  90 tablet    Refill:  1   tiotropium (SPIRIVA HANDIHALER) 18 MCG inhalation capsule  Sig: INHALE 1 CAPSULE VIA HANDIHALER ONCE DAILY     Dispense:  90 capsule    Refill:  3   ibandronate (BONIVA) 150 MG tablet    Sig: Take 1 tablet (150 mg total) by mouth every 30 (thirty) days. Take in the morning with a full glass of water, on an empty stomach, and do not take anything else by mouth or lie down for the next 30 min.    Dispense:  3 tablet    Refill:  3   ipratropium-albuterol (DUONEB) 0.5-2.5 (3) MG/3ML SOLN    Sig: Take 3 mLs by nebulization every 2 (two) hours as needed. DX: J44.9    Dispense:  360 mL    Refill:  3    Please fill insurance thru part B    This patient was seen by Drema Dallas, PA-C in collaboration with Dr. Clayborn Bigness as a part of collaborative care agreement.   Total time spent:30 Minutes Time spent includes review of chart, medications, test results, and follow up plan with the patient.      Dr Lavera Guise Internal medicine

## 2021-11-30 ENCOUNTER — Other Ambulatory Visit: Payer: Self-pay | Admitting: Internal Medicine

## 2021-11-30 DIAGNOSIS — A310 Pulmonary mycobacterial infection: Secondary | ICD-10-CM

## 2021-12-06 ENCOUNTER — Telehealth: Payer: Self-pay

## 2021-12-06 ENCOUNTER — Ambulatory Visit: Payer: Medicare Other | Admitting: Internal Medicine

## 2021-12-06 NOTE — Telephone Encounter (Signed)
Lvm for son to return my call to r/s today's appointment-Toni

## 2021-12-08 ENCOUNTER — Ambulatory Visit: Payer: Medicare Other | Admitting: Student-PharmD

## 2021-12-08 ENCOUNTER — Other Ambulatory Visit: Payer: Self-pay

## 2021-12-08 DIAGNOSIS — J449 Chronic obstructive pulmonary disease, unspecified: Secondary | ICD-10-CM

## 2021-12-08 DIAGNOSIS — M810 Age-related osteoporosis without current pathological fracture: Secondary | ICD-10-CM

## 2021-12-08 DIAGNOSIS — I10 Essential (primary) hypertension: Secondary | ICD-10-CM

## 2021-12-08 NOTE — Progress Notes (Signed)
Initial Pharmacist Visit  Breanna Christian, Breanna Christian  [id number] 81 years,  Female  DOB: [birth year]  M: [phone]  Clinical Summary Situation: Pt son presents via telephone for CCM Initial Visit, with permission of patient. He handles all of her healthcare. Background: Pt recently was in hospital for pneumonia, which exacerbated her COPD. Patient states patient has recovered and is doing very well. BP is well-controlled at office visits. Reports no falls Assessment:: Patient's COPD, HTN, and Osteoporosis are well-controlled and patient is on appropriate medication. Recommendations: -Continue current medication therapy -F/U with pharmacist in 6 months   Visit Details Patient scheduled for CCM visit with the clinical pharmacist.  Patient is referred for CCM by their PCP and CPP is under general PCP supervision.: At least 2 of these conditions are expected to last 12 months or longer and patient is at significant risk for acute exacerbations and/or functional decline.  Patient has consented to participation in [facility] program. Visit Type: Phone Call Date of Visit: 12/08/2021  Patient Chart Prep  Completed by Charlann Lange on 12/06/2021  Chronic Conditions Patient's Chronic Conditions: Hypertension (HTN), Chronic Obstructive Pulmonary Disease (COPD), Osteopenia or Osteoporosis  Doctor and Hospital Visits Were there PCP Visits in last 6 months?: Yes Visit #1: 08/02/21 Dr. Humphrey Rolls. For Shortness of breath. STARTED Levofloxacin 500 mg daily, for 10 days . Visit #2: 08/23/21 Dr. Humphrey Rolls For chronic cough/COPD. No medication changes. Visit #3: 08/30/21 Dr. Humphrey Rolls For Medicare wellness visit. No medication changes. Visit #4: 11/29/21 Mylinda Latina, PA-C. For mycobacterium avium-intracellulare infection. CHANGED Rifampin to 150 mg 1 cap by mouth daily. Were there Specialist Visits in last 6 months?: Yes Visit #1: 06/07/21 Nephrology Anthonette Legato, MD. For follow-up. STOPPED Patiromer Sorbitex Calcium 8.4  g pack . Visit #2: 08/30/21 Nephrology Anthonette Legato, MD. For follow-up. No medication changes. Was there a Hospital Visit in last 30 days?: No Were there other Hospital Visits in last 6 months?: No  Medication Information Are there any Medication discrepancies?: No Are there any Medication adherence gaps (beyond 5 days past due)?: No Medication adherence rates for the STAR rating drugs: None. List Patient's current Care Gaps: No current Care Gaps identified  Pre-Call Questions  Are you able to connect with Patient: Yes Visit Type: Phone Call May we confirm what is the best phone # for the pharmacist to call you?: [phone]  Confirm visit date/time: 12/08/21 at 11:00 AM Have you seen any other providers since your last visit?: No Any changes in your medicines or health?: No Any side effects from any medications?: No Do you have any symptoms or problems not managed by your medications?: No What are your top 2 health concerns right now?: Patients son stated none. What are your top 2 medication concerns right now?: Cost Has your Dr asked that you take BP, BG or special diet at home?: Other Details: Patient stated none. Do you get any type of exercise on a regular basis?: Yes What type of exercise and how often?: Patient sons stated she goes the [employer] at least 3-4 times a week. What are your top 1 or 2 goals for your health?: Patients son stated to stay as healthy that she is. What are you doing to try to reach these goals?: Monitoring BP, Taking medications, Exercise What, if any, problems do you have getting your medications from the pharmacy?: None Is there anything else that you would like to discuss during the appointment?: No   Disease Assessments  Subjective Information Current BP: 126/70 Current HR: 91  taken on: 11/29/2021 Previous BP: 110/70 Previous HR: 84 taken on: 08/29/2021 Weight: 127 BMI: 24.00 Last GFR: 92 taken on: 02/02/2021 Why did the patient present?:  CCM Initial Visit Marital status?: Widowed Details: Husband passed in 2017 Education level?: [employer] graduate Retired? 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 Who does the patient spend their time with and what do they do?: Patient spends time with her family. Her children live very close by, and one son accompanies her to the gym when he can. Lifestyle habits such as diet and exercise?: Diet: consists mostly of soup and veggies. Mindful of potassium in diet. Likes to drink water, one small coffee a day, and chamomile tea Exercise: Stays active in garden and also goes to [employer] Alcohol, tobacco, and illicit drug usage?: None What is the patient's sleep pattern?: No sleep issues How many hours per night does patient typically sleep?: 6-8 broken up, patient satisfied with sleep Patient pleased with health care they are receiving?: Yes Family, occupational, and living circumstances relevant to overall health?: Children are very involved in her life and healthcare; has an excellent support system Factors that may affect medication adherence?: Pill burden Name and location of Current pharmacy: Mail Order Current Rx insurance plan: Blue Medicare Are meds synced by current pharmacy?: No Are meds delivered by current pharmacy?: Yes - by mail order pharmacy Would patient benefit from direct intervention of clinical lead in dispensing process to optimize clinical outcomes?: Yes Are [facility] pharmacy services available where patient lives?: Yes Is patient disadvantaged to use [facility]?: Yes Does patient experience delays in picking up medications due to transportation concerns (getting to pharmacy)?: No Any additional demeanor/mood notes?: Phone appt was with patient's son who handles  her medications and appts  Hypertension (HTN) Assess this condition today?: Yes Is patient able to obtain BP reading today?: No Goal: <130/80 mmHG Hypertension Stage: Elevated (SBP: 120-129 and DBP < 80) Is Patient checking BP at home?: No How often does patient miss taking their blood pressure medications?: N/A Has patient experienced hypotension, dizziness, falls or bradycardia?: No BP RPM device: Does patient qualify?: No We discussed: Targeting 150 minutes of aerobic activity per week Assessment:: Controlled Drug: Diet and Exercise Assessment: Appropriate, Effective, Safe, Accessible Plan to (other): Continue to manage with diet and exercise HC Follow up: 3 months Pharmacist Follow up: 6 months  Chronic Obstructive Pulmonary Disease (COPD) Current FEV1/FVC: 41 Current FEV1: 0.5 taken on: 08/01/2021 Current Eosinophils: 2 taken on: 12/03/2020 Assess this condition today?: Yes Gold group: A (low sx, < 2 exacerbations / yr) Exacerbations in past year without hospitalization: No Is patient currently Smoking or Vaping?: No Did patient smoke/vape before?: No Home oxygen therapy: No Frequency of SABA/SAMA use: Infrequently Influenza vaccine: Yes Prevnar vaccine: No Pneumovax vaccine: Yes We counseled the patient on:: Inhaler technique, Treatment goals (documented in Care Plan) Assessment:: Controlled Drug: Spiriva 34mg 1 puff daily Assessment: Appropriate, Effective, Safe, Accessible Drug: Albuterol HFA 2 puffs every 6 hours as needed Assessment: Appropriate, Effective, Safe, Accessible Drug: Duoneb 1 vial every 6 hours as needed Assessment: Appropriate, Effective, Safe, Accessible Additional Info: Reports that can afford Spiriva but is pricey, will pursue PAP to try and relieve cost burden Plan to: Continue medication therapy HC Follow up: 3 months  Pharmacist Follow up: 6 months  Osteoporosis Current T-score: -3.3 taken on: 11/29/2020 Current Vitamin D 25-OH:  47.3 taken on: 07/30/2020 Assess this condition today?: Yes Patient has: Osteoporosis In the past 12 months, have you fallen?: No Are there any stairs in or around the home?: Yes Are there any stairs with handrails?: Yes Is the home free of loose throw rugs in walkways, pet beds, electrical cords, etc.?: Yes Is there adequate lighting in your home to reduce the risk of falls?: Yes Dietary calcium intake: takes OTC Calcium chew (along with OTC vitamin D) Bisphosphonate start: 07/2019 We discussed: Dietary calcium intake, Fall prevention, Proper administration of bisphosphonate Assessment:: Controlled Drug: Ibandronate 141m 1 tab once a month Assessment: Appropriate, Effective, Safe, Accessible Plan to (other): Continue current medication therapy HC Follow up: 3 months Pharmacist Follow up: 6 months  Exercise, Diet and Non-Drug Coordination Needs Additional exercise counseling points. We discussed: targeting at least 150 minutes per week of moderate-intensity aerobic exercise. Additional diet counseling points. We discussed: aiming to consume at least 8 cups of water day Discussed Non-Drug Care Coordination Needs: Yes Does Patient have Medication financial barriers?: No  Accountable Health Communities Health-Related Social Needs Screening Tool -  SDOH  What is your living situation today? (ref #1): I have a steady place to live Think about the place you live. Do you have problems with any of the following? (ref #2): None of the above Within the past 12 months, you worried that your food would run out before you got money to buy more (ref #3): Never true Within the past 12 months, the food you bought just didn't last and you didn't have money to get more (ref #4): Never true In the past 12 months, has lack of reliable transportation kept you from medical appointments, meetings, work or from getting things needed for daily living? (ref #5): No In the past 12 months, has the electric,  gas, oil, or water company threatened to shut off services in your home? (ref #6): No How often does anyone, including family and friends, physically hurt you? (ref #7): Never (1) How often does anyone, including family and friends, insult or talk down to you? (ref #8): Never (1) How often does anyone, including friends and family, threaten you with harm? (ref #9): Never (1) How often does anyone, including family and friends, scream or curse at you? (ref #10): Never (1)  Osteoporosis Osteoporosis is when the bones get thin and weak. This can cause your bones to break (fracture) more easily. What are the causes? The exact cause of this condition is not known. What increases the risk? Having family members with this condition. Not eating enough healthy foods. Taking certain medicines. Being female. Being age 4352or older. Smoking or using other products that contain nicotine or tobacco, such as e-cigarettes or chewing tobacco. Not exercising. Being of European or Asian ancestry. Having a small body frame. What are the signs or symptoms? A broken bone might be the first sign, especially if the break results from a fall or injury that usually would not cause a bone to break. Other signs and symptoms include: Pain in the neck or low back. Being hunched over (stooped posture). Getting shorter. How is this treated? Eating more foods with more calcium and vitamin D in them. Doing exercises. Stopping tobacco use. Limiting how much alcohol you drink. Taking medicines to slow bone loss or help make the bones stronger. Taking supplements of calcium and vitamin D every day.  Taking medicines to replace chemicals in the body (hormone replacement medicines). Monitoring your levels of calcium and vitamin D. The goal of treatment is to strengthen your bones and lower your risk for a bone break. Follow these instructions at home: Eating and drinking Eat plenty of calcium and vitamin D. These  nutrients are good for your bones. Good sources of calcium and vitamin D include: Some fish, such as salmon and tuna. Foods that have calcium and vitamin D added to them (fortified foods), such as some breakfast cereals. Egg yolks. Cheese. Liver.   Activity Do exercises as told by your doctor. Ask your doctor what exercises are safe for you. You should do: Exercises that make your muscles work to hold your body weight up (weight-bearing exercises). These include tai chi, yoga, and walking. Exercises to make your muscles stronger. One example is lifting weights. Lifestyle Do not drink alcohol if: Your doctor tells you not to drink. You are pregnant, may be pregnant, or are planning to become pregnant. If you drink alcohol: Limit how much you use to: 0-1 drink a day for women. 0-2 drinks a day for men. Know how much alcohol is in your drink. In the [place] one drink equals one 12 oz bottle of beer (355 mL), one 5 oz glass of wine (148 mL), or one 1 oz glass of hard liquor (44 mL). Do not smoke or use any products that contain nicotine or tobacco. If you need help quitting, ask your doctor. Preventing falls Use tools to help you move around (mobility aids) as needed. These include canes, walkers, scooters, and crutches. Keep rooms well-lit. Put away things on the floor that could make you trip. These include cords and rugs. Install safety rails on stairs. Install grab bars in bathrooms. Use rubber mats in slippery areas, like bathrooms. Wear shoes that: Fit you well. Support your feet. Have closed toes. Have rubber soles or low heels. Tell your doctor about all of the medicines you are taking. Some medicines can make you more likely to fall. General instructions Take over-the-counter and prescription medicines only as told by your doctor. Keep all follow-up visits. Contact a doctor if: You have not been tested (screened) for osteoporosis and you are: A woman who is age 40 or  older. A man who is age 46 or older. Get help right away if: You fall. You get hurt. Summary Osteoporosis happens when your bones get thin and weak. Weak bones can break (fracture) more easily. Eat plenty of calcium and vitamin D. These are good for your bones. Tell your doctor about all of the medicines that you take. This information is not intended to replace advice given to you by your health care provider. Make sure you discuss any questions you have with your health care provider.       Alena Bills Clinical Pharmacist [phone]

## 2021-12-09 ENCOUNTER — Telehealth: Payer: Self-pay | Admitting: Student-PharmD

## 2021-12-09 NOTE — Progress Notes (Signed)
° °  Chronic Care Management Pharmacy Assistant   Name: Breanna Christian  MRN: [id number] DOB: January 03, 1941   Reason for Encounter: PAP  Medications: Outpatient Encounter Medications as of 12/09/2021  Medication Sig   ARTIFICIAL TEAR OP Place 1 drop into both eyes every 6 (six) hours as needed (dry eyes). A product from [place].   azithromycin (ZITHROMAX) 250 MG tablet Take 1 tablet (250 mg total) by mouth daily.   clotrimazole-betamethasone (LOTRISONE) cream APPLY TO AFFECTED AREA TWICE A DAY   ethambutol (MYAMBUTOL) 400 MG tablet Take 1 tablet (400 mg total) by mouth daily.   ibandronate (BONIVA) 150 MG tablet Take 1 tablet (150 mg total) by mouth every 30 (thirty) days. Take in the morning with a full glass of water, on an empty stomach, and do not take anything else by mouth or lie down for the next 30 min.   ibuprofen (ADVIL,MOTRIN) 200 MG tablet Take 200 mg by mouth as needed.   ipratropium-albuterol (DUONEB) 0.5-2.5 (3) MG/3ML SOLN Take 3 mLs by nebulization every 2 (two) hours as needed. DX: J44.9   Lifitegrast 5 % SOLN Apply 1 drop to eye in the morning and at bedtime. 1 Gtt both eyes twice a day   Multiple Vitamin (MULTI-VITAMINS) TABS Take 1 tablet by mouth daily. Reported on 04/25/2016   Omega-3 Fatty Acids (OMEGA-3 FISH OIL) 300 MG CAPS Take by mouth daily.   PROAIR HFA 108 (90 Base) MCG/ACT inhaler INHALE 2 PUFFS EVERY 6 HOURS AS NEEDED   rifampin (RIFADIN) 150 MG capsule TAKE 1 CAPSULE BY MOUTH EVERY DAY   sodium chloride HYPERTONIC 3 % nebulizer solution Take by nebulization as needed for other.   tiotropium (SPIRIVA HANDIHALER) 18 MCG inhalation capsule INHALE 1 CAPSULE VIA HANDIHALER ONCE DAILY   No facility-administered encounter medications on file as of 12/09/2021.   New patient assistance application form filled out to [facility] for Spiriva. Waiting for patient and provider to complete and sign documentation. Called patient to inquire if they wanted the application mailed to  them or if they wanted to come into the office. Patient is required to sign application and to bring/have proof of income. Her son stated he would be willing to come into office to bring proof of income and sign application once the paperwork comes in the mail her son would like it mailed to their residence address [place] [id number].  Charlann Lange, [place]  [phone]

## 2021-12-13 ENCOUNTER — Encounter: Payer: Self-pay | Admitting: Internal Medicine

## 2021-12-13 ENCOUNTER — Other Ambulatory Visit: Payer: Self-pay

## 2021-12-13 ENCOUNTER — Ambulatory Visit (INDEPENDENT_AMBULATORY_CARE_PROVIDER_SITE_OTHER): Payer: Medicare Other | Admitting: Internal Medicine

## 2021-12-13 ENCOUNTER — Telehealth: Payer: Self-pay

## 2021-12-13 VITALS — BP 136/78 | HR 96 | Temp 97.9°F | Resp 16 | Ht 62.0 in | Wt 128.4 lb

## 2021-12-13 DIAGNOSIS — A310 Pulmonary mycobacterial infection: Secondary | ICD-10-CM

## 2021-12-13 DIAGNOSIS — J449 Chronic obstructive pulmonary disease, unspecified: Secondary | ICD-10-CM | POA: Diagnosis not present

## 2021-12-13 DIAGNOSIS — R918 Other nonspecific abnormal finding of lung field: Secondary | ICD-10-CM

## 2021-12-13 DIAGNOSIS — R0602 Shortness of breath: Secondary | ICD-10-CM

## 2021-12-13 DIAGNOSIS — J9611 Chronic respiratory failure with hypoxia: Secondary | ICD-10-CM | POA: Diagnosis not present

## 2021-12-13 NOTE — Telephone Encounter (Signed)
Chest CT ordered. Printed. Put on Titania's desk-Breanna Christian

## 2021-12-13 NOTE — Patient Instructions (Signed)

## 2021-12-13 NOTE — Progress Notes (Signed)
[facility] [place], [place] [address]  Pulmonary Sleep Medicine   Office Visit Note  Patient Name: Breanna Christian DOB: May 01, 1941 MRN [id number]  Date of Service: 12/13/2021  Complaints/HPI: MAI. She has been doing well. She is on her medications and has gained significnat benefit from treatment. Her FEV1 is relatively stable. Reviewed her spiro results today appear to be relatively stable.  As far as her MAI is concerned she is doing well with treatment the son states that she is very compliant with the recommended treatments as were discussed previously.  At this time she does have occasional cough some congestion but is nothing out of the ordinary for her.  ROS  General: (-) fever, (-) chills, (-) night sweats, (-) weakness Skin: (-) rashes, (-) itching,. Eyes: (-) visual changes, (-) redness, (-) itching. Nose and Sinuses: (-) nasal stuffiness or itchiness, (-) postnasal drip, (-) nosebleeds, (-) sinus trouble. Mouth and Throat: (-) sore throat, (-) hoarseness. Neck: (-) swollen glands, (-) enlarged thyroid, (-) neck pain. Respiratory: + cough, (-) bloody sputum, + shortness of breath, - wheezing. Cardiovascular: - ankle swelling, (-) chest pain. Lymphatic: (-) lymph node enlargement. Neurologic: (-) numbness, (-) tingling. Psychiatric: (-) anxiety, (-) depression   Current Medication: Outpatient Encounter Medications as of 12/13/2021  Medication Sig   ARTIFICIAL TEAR OP Place 1 drop into both eyes every 6 (six) hours as needed (dry eyes). A product from [place].   azithromycin (ZITHROMAX) 250 MG tablet Take 1 tablet (250 mg total) by mouth daily.   clotrimazole-betamethasone (LOTRISONE) cream APPLY TO AFFECTED AREA TWICE A DAY   ethambutol (MYAMBUTOL) 400 MG tablet Take 1 tablet (400 mg total) by mouth daily.   ibandronate (BONIVA) 150 MG tablet Take 1 tablet (150 mg total) by mouth every 30 (thirty) days. Take in the morning with a full glass of  water, on an empty stomach, and do not take anything else by mouth or lie down for the next 30 min.   ibuprofen (ADVIL,MOTRIN) 200 MG tablet Take 200 mg by mouth as needed.   ipratropium-albuterol (DUONEB) 0.5-2.5 (3) MG/3ML SOLN Take 3 mLs by nebulization every 2 (two) hours as needed. DX: J44.9   Lifitegrast 5 % SOLN Apply 1 drop to eye in the morning and at bedtime. 1 Gtt both eyes twice a day   Multiple Vitamin (MULTI-VITAMINS) TABS Take 1 tablet by mouth daily. Reported on 04/25/2016   Omega-3 Fatty Acids (OMEGA-3 FISH OIL) 300 MG CAPS Take by mouth daily.   PROAIR HFA 108 (90 Base) MCG/ACT inhaler INHALE 2 PUFFS EVERY 6 HOURS AS NEEDED   rifampin (RIFADIN) 150 MG capsule TAKE 1 CAPSULE BY MOUTH EVERY DAY   sodium chloride HYPERTONIC 3 % nebulizer solution Take by nebulization as needed for other.   tiotropium (SPIRIVA HANDIHALER) 18 MCG inhalation capsule INHALE 1 CAPSULE VIA HANDIHALER ONCE DAILY   No facility-administered encounter medications on file as of 12/13/2021.    Surgical History: Past Surgical History:  Procedure Laterality Date   APPENDECTOMY     BRONCHOSCOPY  02/2016   CATARACT EXTRACTION W/PHACO Right 10/26/2016   Procedure: CATARACT EXTRACTION PHACO AND INTRAOCULAR LENS PLACEMENT ([place]);  Surgeon: Estill Cotta, MD;  Location: [facility] ORS;  Service: Ophthalmology;  Laterality: Right;  [place] 2.17 AP% 26.5 CDE 56.86 Fluid pack lot # [id number]   FLEXIBLE BRONCHOSCOPY Bilateral 03/16/2016   Procedure: FLEXIBLE BRONCHOSCOPY;  Surgeon: Allyne Gee, MD;  Location: [facility] ORS;  Service: Pulmonary;  Laterality: Bilateral;  FLEXIBLE BRONCHOSCOPY N/A 03/03/2017   Procedure: FLEXIBLE BRONCHOSCOPY;  Surgeon: Allyne Gee, MD;  Location: [facility] ORS;  Service: Pulmonary;  Laterality: N/A;   TUBAL LIGATION      Medical History: Past Medical History:  Diagnosis Date   Bronchiectasis ([place]) 2011   COPD (chronic obstructive pulmonary disease) (HCC)    HOH (hard of hearing)     Hypertension    Lung nodules    Mycobacterium avium complex colonization    Non-Hodgkin lymphoma ([place]) 2003   Personal history of chemotherapy    Pneumonia    FREQUENT IN PAST    H/O MYCOBACTERIAL   Rheumatoid arthritis (HCC)    Sjogren's syndrome (HCC)    Xerostomia     Family History: Family History  Problem Relation Age of Onset   Cancer Sister    Breast cancer Neg Hx     Social History: Social History   Socioeconomic History   Marital status: Married    Spouse name: Not on file   Number of children: Not on file   Years of education: Not on file   Highest education level: Not on file  Occupational History   Not on file  Tobacco Use   Smoking status: Never   Smokeless tobacco: Never  Vaping Use   Vaping Use: Never used  Substance and Sexual Activity   Alcohol use: No   Drug use: No   Sexual activity: Not on file  Other Topics Concern   Not on file  Social History Narrative   Not on file   Social Determinants of Health   Financial Resource Strain: Not on file  Food Insecurity: Not on file  Transportation Needs: Not on file  Physical Activity: Not on file  Stress: Not on file  Social Connections: Not on file  Intimate Partner Violence: Not on file    Vital Signs: Blood pressure 136/78, pulse 96, temperature 97.9 F (36.6 C), resp. rate 16, height _0  (1.575 m), weight 128 lb 6.4 oz (58.2 kg), SpO2 96 %.  Examination: General Appearance: The patient is well-developed, well-nourished, and in no distress. Skin: Gross inspection of skin unremarkable. Head: normocephalic, no gross deformities. Eyes: no gross deformities noted. ENT: ears appear grossly normal no exudates. Neck: Supple. No thyromegaly. No LAD. Respiratory: Few distant rhonchi are noted.. Cardiovascular: Normal S1 and S2 without murmur or rub. Extremities: No cyanosis. pulses are equal. Neurologic: Alert and oriented. No involuntary movements.  LABS: No results found for this or any  previous visit (from the past 2160 hour(s)).  Radiology: DG Chest 2 View  Result Date: 08/30/2021 CLINICAL DATA:  81 year old female with history of pneumonia. Follow-up study. EXAM: CHEST - 2 VIEW COMPARISON:  Chest x-ray 08/02/2021. FINDINGS: Lung volumes are normal. Diffuse areas of interstitial prominence, very severe diffuse peribronchial cuffing, patchy ill-defined opacities scattered asymmetrically throughout the lungs bilaterally (right greater than left), and some more consolidative opacities in the periphery of the right lung base are overall very similar to the prior examination, although there is slight improved aeration in the periphery of the right lung base. No pneumothorax. No cephalization of the pulmonary vasculature. No definite pleural effusions. Heart size is normal. Upper mediastinal contours are within normal limits. Atherosclerotic calcifications in the thoracic aorta. IMPRESSION: 1. Overall, the appearance of the chest is relatively similar to the prior study with slight improvement in aeration at the right lung base. Findings are most suggestive of a chronic indolent atypical infectious process such as MAI (mycobacterium avium  intracellulare) and could be better evaluated with follow-up nonemergent chest CT. 2. Aortic atherosclerosis. Electronically Signed   By: Vinnie Langton M.D.   On: 08/30/2021 16:13    No results found.  No results found.    Assessment and Plan: Patient Active Problem List   Diagnosis Date Noted   Encounter for general adult medical examination with abnormal findings 08/08/2020   Other fatigue 08/08/2020   Need for vaccination against Streptococcus pneumoniae using pneumococcal conjugate vaccine 7 08/08/2020   Age related osteoporosis 10/15/2018   Ovarian failure 10/15/2018   Screening for breast cancer 04/29/2018   Essential hypertension 04/29/2018   Obstructive chronic bronchitis without exacerbation ([place]) 04/29/2018   MAI (mycobacterium  avium-intracellulare) ([place]) 02/08/2018   Numbness and tingling 03/25/2015   Myopathy 02/19/2015   Polyneuropathy 02/18/2015   Weakness of both lower extremities 02/18/2015   1. SOB (shortness of breath) She is doing well overall appears to be at her baseline we will do a follow-up spirometry on her. - Spirometry with Graph  2. Obstructive chronic bronchitis without exacerbation ([place]) Patient is doing fine with using the DuoNeb.  She also does have ProAir and Spiriva HandiHaler.  She does better with the DuoNeb so this should be continued.  3. MAI (mycobacterium avium-intracellulare) infection ([place]) On treatment this will be continued she has been on the Rifadin and azithromycin she is able to tolerate these relatively well along with the ethambutol  4. Chronic respiratory failure with hypoxia (HCC) On oxygen plan is going to be to continue with the oxygen therapy  5. Abnormal findings on diagnostic imaging of lung We will get a follow-up on CT to see if there is any progression of disease and worsening - CT Chest High Resolution; Future   General Counseling: I have discussed the findings of the evaluation and examination with Katharin.  I have also discussed any further diagnostic evaluation thatmay be needed or ordered today. Enyla verbalizes understanding of the findings of todays visit. We also reviewed her medications today and discussed drug interactions and side effects including but not limited excessive drowsiness and altered mental states. We also discussed that there is always a risk not just to her but also people around her. she has been encouraged to call the office with any questions or concerns that should arise related to todays visit.  Orders Placed This Encounter  Procedures   Spirometry with Graph    Order Specific Question:   Where should this test be performed?    Answer:   [facility]    Order Specific Question:   Basic spirometry    Answer:    Yes    Order Specific Question:   Spirometry pre & post bronchodilator    Answer:   No     Time spent: 22  I have personally obtained a history, examined the patient, evaluated laboratory and imaging results, formulated the assessment and plan and placed orders.    Allyne Gee, MD [facility] Pulmonary and Critical Care Sleep medicine

## 2021-12-19 DIAGNOSIS — I10 Essential (primary) hypertension: Secondary | ICD-10-CM | POA: Diagnosis not present

## 2021-12-19 DIAGNOSIS — J449 Chronic obstructive pulmonary disease, unspecified: Secondary | ICD-10-CM | POA: Diagnosis not present

## 2021-12-24 DIAGNOSIS — H2512 Age-related nuclear cataract, left eye: Secondary | ICD-10-CM | POA: Diagnosis not present

## 2021-12-28 ENCOUNTER — Other Ambulatory Visit: Payer: Self-pay | Admitting: Internal Medicine

## 2021-12-28 DIAGNOSIS — J449 Chronic obstructive pulmonary disease, unspecified: Secondary | ICD-10-CM

## 2022-02-01 ENCOUNTER — Telehealth: Payer: Self-pay

## 2022-02-01 NOTE — Telephone Encounter (Signed)
Patient scheduled for ct chest on 02/15/22 @ 11:30 kp/tat ? ?*left message with son Iona Beard ?

## 2022-02-03 NOTE — Patient Outreach (Signed)
Received a insurance referral notification for [name] ?I have assigned Deloria Lair, NP to call for follow up and determine if there are any Case Management needs.  ?  ?Arville Care, CBCS, CMAA ?[place] Management Assistant ?[facility] [facility] ?[phone]   ?

## 2022-02-10 ENCOUNTER — Other Ambulatory Visit: Payer: Self-pay | Admitting: *Deleted

## 2022-02-10 NOTE — Patient Outreach (Signed)
[facility]) Care Management ? ?02/10/2022 ? ?Breanna Christian ?27-Jun-1941 ?[id number] ? ?Initial telephone outreach for possible [facility] Care Management servcies. ? ?Talked with son, Breanna Christian, who does all communications as his mother is HOH. Provided information on our services. Iona Beard reports he takes care of all of his mother's needs and they work closely with Dr. Chancy Milroy to ensure she is well cared for. ? ?Son, agreed to receive a successful outreach letter with our information for future reference. ? ?[name]. Kyoko Elsea, MSN, GNP-BC ?Gerontological Nurse Practitioner ?[facility] Care Management ?[phone] ? ? ?

## 2022-02-15 ENCOUNTER — Ambulatory Visit
Admission: RE | Admit: 2022-02-15 | Discharge: 2022-02-15 | Disposition: A | Discharge status: Home / Self Care | Payer: Medicare Other | Source: Ambulatory Visit | Attending: Internal Medicine | Admitting: Internal Medicine

## 2022-02-15 ENCOUNTER — Other Ambulatory Visit: Payer: Self-pay

## 2022-02-15 DIAGNOSIS — J9811 Atelectasis: Secondary | ICD-10-CM | POA: Diagnosis not present

## 2022-02-15 DIAGNOSIS — S2242XA Multiple fractures of ribs, left side, initial encounter for closed fracture: Secondary | ICD-10-CM | POA: Diagnosis not present

## 2022-02-15 DIAGNOSIS — Z8701 Personal history of pneumonia (recurrent): Secondary | ICD-10-CM | POA: Diagnosis not present

## 2022-02-15 DIAGNOSIS — R918 Other nonspecific abnormal finding of lung field: Secondary | ICD-10-CM | POA: Diagnosis not present

## 2022-02-15 DIAGNOSIS — J479 Bronchiectasis, uncomplicated: Secondary | ICD-10-CM | POA: Diagnosis not present

## 2022-04-25 ENCOUNTER — Other Ambulatory Visit: Payer: Self-pay | Admitting: Physician Assistant

## 2022-04-25 DIAGNOSIS — A310 Pulmonary mycobacterial infection: Secondary | ICD-10-CM

## 2022-05-17 ENCOUNTER — Telehealth: Payer: Self-pay

## 2022-05-17 NOTE — Telephone Encounter (Signed)
Patient's son Greggory Stallion returned my call and I informed him that Ashly from [place] has a form for DSK to complete for pt to get a High-Frequency Chest Wall Oscillation (the Vest) and son advised that [employer] had came to house about oxygen and also mentioned to pt about the Vest and he isn't sure if the vest is something pt really would benefit from and wants to wait and discuss with DSK at her next pulmonary appt on 05/31/22

## 2022-05-30 ENCOUNTER — Encounter: Payer: Self-pay | Admitting: Physician Assistant

## 2022-05-30 ENCOUNTER — Ambulatory Visit (INDEPENDENT_AMBULATORY_CARE_PROVIDER_SITE_OTHER): Payer: Medicare Other | Admitting: Physician Assistant

## 2022-05-30 DIAGNOSIS — R5383 Other fatigue: Secondary | ICD-10-CM | POA: Diagnosis not present

## 2022-05-30 DIAGNOSIS — E559 Vitamin D deficiency, unspecified: Secondary | ICD-10-CM | POA: Diagnosis not present

## 2022-05-30 DIAGNOSIS — M810 Age-related osteoporosis without current pathological fracture: Secondary | ICD-10-CM | POA: Diagnosis not present

## 2022-05-30 DIAGNOSIS — E782 Mixed hyperlipidemia: Secondary | ICD-10-CM | POA: Diagnosis not present

## 2022-05-30 DIAGNOSIS — R7989 Other specified abnormal findings of blood chemistry: Secondary | ICD-10-CM

## 2022-05-30 DIAGNOSIS — J449 Chronic obstructive pulmonary disease, unspecified: Secondary | ICD-10-CM | POA: Diagnosis not present

## 2022-05-30 DIAGNOSIS — E538 Deficiency of other specified B group vitamins: Secondary | ICD-10-CM | POA: Diagnosis not present

## 2022-05-30 MED ORDER — PNEUMOCOCCAL 20-VAL CONJ VACC 0.5 ML IM SUSY: 0.5000 mL | PREFILLED_SYRINGE | INTRAMUSCULAR | 0 refills | Status: AC

## 2022-05-30 NOTE — Progress Notes (Signed)
[facility] [place], [place] [address]  Internal MEDICINE  Office Visit Note  Patient Name: Breanna Christian  [id number]  [id number]  Date of Service: 05/30/2022  Chief Complaint  Patient presents with   Follow-up   Hypertension   Quality Metric Gaps    Tetanus, Pneumonia and Shingles Vaccines - Pt will be traveling to [place] in 2 weeks     HPI Pt is here for routine follow up with her son -Has her pulmonary follow up tomorrow with Dr. Humphrey Rolls -Will be traveling to [place] in 2 weeks, will be gone for 1 month -Has had 4 rounds of her covid vaccine, considering updating new booster before traveling. May also see about updating PNA vaccine with prevnar-20. Plans to discuss with Dr, Humphrey Rolls tomorrow before moving forward with vaccinations -Not seeing nephrology anymore since potassium has been back in range.  -Has annual wellness scheduled in October and will go ahead and order routine fasting labs to be done prior -Mild swelling in feet and some color changing in toes, but also reports she is cold the in office today and wearing flip flops. She has had normal ABI last year. Will elevate legs and monitor--may need to update ABI/see vascular. Denies any pain or changes in sensation. -BP stable  Current Medication: Outpatient Encounter Medications as of 05/30/2022  Medication Sig   ARTIFICIAL TEAR OP Place 1 drop into both eyes every 6 (six) hours as needed (dry eyes). A product from [place].   azithromycin (ZITHROMAX) 250 MG tablet TAKE 1 TABLET EVERY DAY   clotrimazole-betamethasone (LOTRISONE) cream APPLY TO AFFECTED AREA TWICE A DAY   ethambutol (MYAMBUTOL) 400 MG tablet TAKE 1 TABLET EVERY DAY   ibandronate (BONIVA) 150 MG tablet Take 1 tablet (150 mg total) by mouth every 30 (thirty) days. Take in the morning with a full glass of water, on an empty stomach, and do not take anything else by mouth or lie down for the next 30 min.   ibuprofen (ADVIL,MOTRIN) 200 MG  tablet Take 200 mg by mouth as needed.   ipratropium-albuterol (DUONEB) 0.5-2.5 (3) MG/3ML SOLN Take 3 mLs by nebulization every 2 (two) hours as needed. DX: J44.9   Lifitegrast 5 % SOLN Apply 1 drop to eye in the morning and at bedtime. 1 Gtt both eyes twice a day   Multiple Vitamin (MULTI-VITAMINS) TABS Take 1 tablet by mouth daily. Reported on 04/25/2016   Omega-3 Fatty Acids (OMEGA-3 FISH OIL) 300 MG CAPS Take by mouth daily.   PROAIR HFA 108 (90 Base) MCG/ACT inhaler INHALE 2 PUFFS EVERY 6 HOURS AS NEEDED   rifampin (RIFADIN) 150 MG capsule TAKE 1 CAPSULE EVERY DAY   sodium chloride HYPERTONIC 3 % nebulizer solution Take by nebulization as needed for other.   SPIRIVA HANDIHALER 18 MCG inhalation capsule INHALE 1 CAPSULE VIA HANDIHALER ONCE DAILY   [DISCONTINUED] pneumococcal 20-valent conjugate vaccine (PREVNAR 20) 0.5 ML injection Inject 0.5 mLs into the muscle tomorrow at 10 am.   pneumococcal 20-valent conjugate vaccine (PREVNAR 20) 0.5 ML injection Inject 0.5 mLs into the muscle tomorrow at 10 am for 1 dose.   No facility-administered encounter medications on file as of 05/30/2022.    Surgical History: Past Surgical History:  Procedure Laterality Date   APPENDECTOMY     BRONCHOSCOPY  02/2016   CATARACT EXTRACTION W/PHACO Right 10/26/2016   Procedure: CATARACT EXTRACTION PHACO AND INTRAOCULAR LENS PLACEMENT ([place]);  Surgeon: Estill Cotta, MD;  Location: [facility] ORS;  Service:  Ophthalmology;  Laterality: Right;  [place] 2.17 AP% 26.5 CDE 56.86 Fluid pack lot # [id number]   FLEXIBLE BRONCHOSCOPY Bilateral 03/16/2016   Procedure: FLEXIBLE BRONCHOSCOPY;  Surgeon: Allyne Gee, MD;  Location: [facility] ORS;  Service: Pulmonary;  Laterality: Bilateral;   FLEXIBLE BRONCHOSCOPY N/A 03/03/2017   Procedure: FLEXIBLE BRONCHOSCOPY;  Surgeon: Allyne Gee, MD;  Location: [facility] ORS;  Service: Pulmonary;  Laterality: N/A;   TUBAL LIGATION      Medical History: Past Medical History:  Diagnosis Date    Bronchiectasis ([place]) 2011   COPD (chronic obstructive pulmonary disease) (HCC)    HOH (hard of hearing)    Hypertension    Lung nodules    Mycobacterium avium complex colonization    Non-Hodgkin lymphoma ([place]) 2003   Personal history of chemotherapy    Pneumonia    FREQUENT IN PAST    H/O MYCOBACTERIAL   Rheumatoid arthritis (HCC)    Sjogren's syndrome (HCC)    Xerostomia     Family History: Family History  Problem Relation Age of Onset   Cancer Sister    Breast cancer Neg Hx     Social History   Socioeconomic History   Marital status: Married    Spouse name: Not on file   Number of children: Not on file   Years of education: Not on file   Highest education level: Not on file  Occupational History   Not on file  Tobacco Use   Smoking status: Never   Smokeless tobacco: Never  Vaping Use   Vaping Use: Never used  Substance and Sexual Activity   Alcohol use: No   Drug use: No   Sexual activity: Not on file  Other Topics Concern   Not on file  Social History Narrative   Not on file   Social Determinants of Health   Financial Resource Strain: Not on file  Food Insecurity: Not on file  Transportation Needs: Not on file  Physical Activity: Not on file  Stress: Not on file  Social Connections: Not on file  Intimate Partner Violence: Not on file      Review of Systems  Constitutional:  Negative for chills, fatigue and unexpected weight change.  HENT:  Negative for congestion, postnasal drip, rhinorrhea, sneezing and sore throat.   Eyes:  Negative for redness.  Respiratory:  Negative for cough, chest tightness and shortness of breath.   Cardiovascular:  Negative for chest pain and palpitations.  Gastrointestinal:  Negative for abdominal pain, constipation, diarrhea, nausea and vomiting.  Genitourinary:  Negative for dysuria and frequency.  Musculoskeletal:  Negative for arthralgias, back pain, joint swelling and neck pain.  Skin:  Positive for color change.  Negative for rash.  Neurological: Negative.  Negative for tremors and numbness.  Hematological:  Negative for adenopathy. Does not bruise/bleed easily.  Psychiatric/Behavioral:  Negative for behavioral problems (Depression), sleep disturbance and suicidal ideas. The patient is not nervous/anxious.     Vital Signs: BP 139/74   Pulse 90   Temp 97.8 F (36.6 C)   Resp 16   Ht _0  (1.575 m)   Wt 126 lb (57.2 kg)   SpO2 96%   BMI 23.05 kg/m    Physical Exam Vitals and nursing note reviewed.  Constitutional:      Appearance: Normal appearance. She is normal weight.  HENT:     Head: Normocephalic and atraumatic.     Nose: Nose normal.     Mouth/Throat:     Mouth: Mucous membranes  are moist.     Pharynx: No posterior oropharyngeal erythema.  Eyes:     Extraocular Movements: Extraocular movements intact.     Pupils: Pupils are equal, round, and reactive to light.  Cardiovascular:     Rate and Rhythm: Normal rate and regular rhythm.     Pulses: Normal pulses.     Heart sounds: Normal heart sounds.  Pulmonary:     Effort: Pulmonary effort is normal. No respiratory distress.     Breath sounds: Normal breath sounds. No wheezing or rales.  Musculoskeletal:        General: Normal range of motion.  Skin:    General: Skin is warm and dry.     Comments: Mild swelling in both feet with some darkening at the toes which are cold on exam. Sensation in tact with good pulses and no pain.  Neurological:     General: No focal deficit present.     Mental Status: She is alert.  Psychiatric:        Mood and Affect: Mood normal.        Behavior: Behavior normal.        Assessment/Plan: 1. Obstructive chronic bronchitis without exacerbation ([place]) Continue inhalers as prescribed, follows up with pulmonology tomorrow  2. Age related osteoporosis, unspecified pathological fracture presence Continue boniva as before  3. B12 deficiency - B12 and Folate Panel  4. Vitamin D  deficiency - VITAMIN D 25 Hydroxy (Vit-D Deficiency, Fractures)  5. Mixed hyperlipidemia - Lipid Panel With LDL/HDL Ratio  6. Abnormal thyroid blood test - TSH + free T4  7. Other fatigue - CBC w/Diff/Platelet - Comprehensive metabolic panel - TSH + free T4 - Lipid Panel With LDL/HDL Ratio - VITAMIN D 25 Hydroxy (Vit-D Deficiency, Fractures) - B12 and Folate Panel   General Counseling: Nitika verbalizes understanding of the findings of todays visit and agrees with plan of treatment. I have discussed any further diagnostic evaluation that may be needed or ordered today. We also reviewed her medications today. she has been encouraged to call the office with any questions or concerns that should arise related to todays visit.    Orders Placed This Encounter  Procedures   CBC w/Diff/Platelet   Comprehensive metabolic panel   TSH + free T4   Lipid Panel With LDL/HDL Ratio   VITAMIN D 25 Hydroxy (Vit-D Deficiency, Fractures)   B12 and Folate Panel    Meds ordered this encounter  Medications   pneumococcal 20-valent conjugate vaccine (PREVNAR 20) 0.5 ML injection    Sig: Inject 0.5 mLs into the muscle tomorrow at 10 am for 1 dose.    Dispense:  0.5 mL    Refill:  0    This patient was seen by Drema Dallas, PA-C in collaboration with Dr. Clayborn Bigness as a part of collaborative care agreement.   Total time spent:30 Minutes Time spent includes review of chart, medications, test results, and follow up plan with the patient.      Dr Lavera Guise Internal medicine

## 2022-05-31 ENCOUNTER — Ambulatory Visit (INDEPENDENT_AMBULATORY_CARE_PROVIDER_SITE_OTHER): Payer: Medicare Other | Admitting: Internal Medicine

## 2022-05-31 ENCOUNTER — Encounter: Payer: Self-pay | Admitting: Internal Medicine

## 2022-05-31 ENCOUNTER — Telehealth: Payer: Self-pay

## 2022-05-31 VITALS — BP 124/64 | HR 100 | Temp 98.0°F | Resp 16 | Ht 61.0 in | Wt 125.8 lb

## 2022-05-31 DIAGNOSIS — R0602 Shortness of breath: Secondary | ICD-10-CM

## 2022-05-31 DIAGNOSIS — J449 Chronic obstructive pulmonary disease, unspecified: Secondary | ICD-10-CM

## 2022-05-31 DIAGNOSIS — J9611 Chronic respiratory failure with hypoxia: Secondary | ICD-10-CM | POA: Diagnosis not present

## 2022-05-31 DIAGNOSIS — J479 Bronchiectasis, uncomplicated: Secondary | ICD-10-CM

## 2022-05-31 DIAGNOSIS — R918 Other nonspecific abnormal finding of lung field: Secondary | ICD-10-CM | POA: Diagnosis not present

## 2022-05-31 DIAGNOSIS — Z23 Encounter for immunization: Secondary | ICD-10-CM | POA: Diagnosis not present

## 2022-05-31 DIAGNOSIS — A310 Pulmonary mycobacterial infection: Secondary | ICD-10-CM

## 2022-05-31 NOTE — Progress Notes (Signed)
[facility] [place], [place] [address]  Pulmonary Sleep Medicine   Office Visit Note  Patient Name: Breanna Christian DOB: 03-Apr-1941 MRN [id number]  Date of Service: 05/31/2022  Complaints/HPI: She is planning on goingto [place] in 2 weeks. Patient was supposed to get her covid booster and also she needs to get a chest vest due to severe bronchiectasis acoording to her DME.She has some cough noted no major change in sputum. She will be getting pneumonia shot. Meds refills are up to date. She is on treatment of MAI doing fine with the medications. COPD has been controlled at this time. Also needs to continue with oxygen. CT chest has shown a nodule. Along with the nodule is RML consolidation. While this is likely MAI related cancer cannot be ruled. Offered to do PET CT but they want to get a repeat CT chest first. If this is abnl will get PET CT and then possible biopsy  ROS  General: (-) fever, (-) chills, (-) night sweats, (-) weakness Skin: (-) rashes, (-) itching,. Eyes: (-) visual changes, (-) redness, (-) itching. Nose and Sinuses: (-) nasal stuffiness or itchiness, (-) postnasal drip, (-) nosebleeds, (-) sinus trouble. Mouth and Throat: (-) sore throat, (-) hoarseness. Neck: (-) swollen glands, (-) enlarged thyroid, (-) neck pain. Respiratory: + cough, (-) bloody sputum, + shortness of breath, + wheezing. Cardiovascular: - ankle swelling, (-) chest pain. Lymphatic: (-) lymph node enlargement. Neurologic: (-) numbness, (-) tingling. Psychiatric: (-) anxiety, (-) depression   Current Medication: Outpatient Encounter Medications as of 05/31/2022  Medication Sig   ARTIFICIAL TEAR OP Place 1 drop into both eyes every 6 (six) hours as needed (dry eyes). A product from [place].   azithromycin (ZITHROMAX) 250 MG tablet TAKE 1 TABLET EVERY DAY   clotrimazole-betamethasone (LOTRISONE) cream APPLY TO AFFECTED AREA TWICE A DAY   ethambutol (MYAMBUTOL) 400 MG  tablet TAKE 1 TABLET EVERY DAY   ibandronate (BONIVA) 150 MG tablet Take 1 tablet (150 mg total) by mouth every 30 (thirty) days. Take in the morning with a full glass of water, on an empty stomach, and do not take anything else by mouth or lie down for the next 30 min.   ibuprofen (ADVIL,MOTRIN) 200 MG tablet Take 200 mg by mouth as needed.   ipratropium-albuterol (DUONEB) 0.5-2.5 (3) MG/3ML SOLN Take 3 mLs by nebulization every 2 (two) hours as needed. DX: J44.9   Lifitegrast 5 % SOLN Apply 1 drop to eye in the morning and at bedtime. 1 Gtt both eyes twice a day   Multiple Vitamin (MULTI-VITAMINS) TABS Take 1 tablet by mouth daily. Reported on 04/25/2016   Omega-3 Fatty Acids (OMEGA-3 FISH OIL) 300 MG CAPS Take by mouth daily.   PROAIR HFA 108 (90 Base) MCG/ACT inhaler INHALE 2 PUFFS EVERY 6 HOURS AS NEEDED   rifampin (RIFADIN) 150 MG capsule TAKE 1 CAPSULE EVERY DAY   sodium chloride HYPERTONIC 3 % nebulizer solution Take by nebulization as needed for other.   SPIRIVA HANDIHALER 18 MCG inhalation capsule INHALE 1 CAPSULE VIA HANDIHALER ONCE DAILY   No facility-administered encounter medications on file as of 05/31/2022.    Surgical History: Past Surgical History:  Procedure Laterality Date   APPENDECTOMY     BRONCHOSCOPY  02/2016   CATARACT EXTRACTION W/PHACO Right 10/26/2016   Procedure: CATARACT EXTRACTION PHACO AND INTRAOCULAR LENS PLACEMENT ([place]);  Surgeon: Estill Cotta, MD;  Location: [facility] ORS;  Service: Ophthalmology;  Laterality: Right;  [place] 2.17 AP%  26.5 CDE 56.86 Fluid pack lot # [id number]   FLEXIBLE BRONCHOSCOPY Bilateral 03/16/2016   Procedure: FLEXIBLE BRONCHOSCOPY;  Surgeon: Allyne Gee, MD;  Location: [facility] ORS;  Service: Pulmonary;  Laterality: Bilateral;   FLEXIBLE BRONCHOSCOPY N/A 03/03/2017   Procedure: FLEXIBLE BRONCHOSCOPY;  Surgeon: Allyne Gee, MD;  Location: [facility] ORS;  Service: Pulmonary;  Laterality: N/A;   TUBAL LIGATION      Medical History: Past Medical  History:  Diagnosis Date   Bronchiectasis ([place]) 2011   COPD (chronic obstructive pulmonary disease) (HCC)    HOH (hard of hearing)    Hypertension    Lung nodules    Mycobacterium avium complex colonization    Non-Hodgkin lymphoma ([place]) 2003   Personal history of chemotherapy    Pneumonia    FREQUENT IN PAST    H/O MYCOBACTERIAL   Rheumatoid arthritis (HCC)    Sjogren's syndrome (HCC)    Xerostomia     Family History: Family History  Problem Relation Age of Onset   Cancer Sister    Breast cancer Neg Hx     Social History: Social History   Socioeconomic History   Marital status: Married    Spouse name: Not on file   Number of children: Not on file   Years of education: Not on file   Highest education level: Not on file  Occupational History   Not on file  Tobacco Use   Smoking status: Never   Smokeless tobacco: Never  Vaping Use   Vaping Use: Never used  Substance and Sexual Activity   Alcohol use: No   Drug use: No   Sexual activity: Not on file  Other Topics Concern   Not on file  Social History Narrative   Not on file   Social Determinants of Health   Financial Resource Strain: Not on file  Food Insecurity: Not on file  Transportation Needs: Not on file  Physical Activity: Not on file  Stress: Not on file  Social Connections: Not on file  Intimate Partner Violence: Not on file    Vital Signs: Blood pressure 124/64, pulse 100, temperature 98 F (36.7 C), resp. rate 16, height _0  (1.549 m), weight 125 lb 12.8 oz (57.1 kg), SpO2 97 %.  Examination: General Appearance: The patient is well-developed, well-nourished, and in no distress. Skin: Gross inspection of skin unremarkable. Head: normocephalic, no gross deformities. Eyes: no gross deformities noted. ENT: ears appear grossly normal no exudates. Neck: Supple. No thyromegaly. No LAD. Respiratory: few rhonchi noted. Cardiovascular: Normal S1 and S2 without murmur or rub. Extremities: No  cyanosis. pulses are equal. Neurologic: Alert and oriented. No involuntary movements.  LABS: No results found for this or any previous visit (from the past 2160 hour(s)).  Radiology: CT Chest High Resolution  Result Date: 02/16/2022 CLINICAL DATA:  Lung nodule. Shortness of breath. Follow-up pneumonia. EXAM: CT CHEST WITHOUT CONTRAST TECHNIQUE: Multidetector CT imaging of the chest was performed following the standard protocol without intravenous contrast. High resolution imaging of the lungs, as well as inspiratory and expiratory imaging, was performed. RADIATION DOSE REDUCTION: This exam was performed according to the departmental dose-optimization program which includes automated exposure control, adjustment of the mA and/or kV according to patient size and/or use of iterative reconstruction technique. COMPARISON:  02/02/2016. FINDINGS: Cardiovascular: Atherosclerotic calcification of the aorta, aortic valve and coronary arteries. Heart size normal. No pericardial effusion. Mediastinum/Nodes: Mediastinal lymph nodes measure up to 1.4 cm in the low right paratracheal station, unchanged. Hilar regions  are difficult to evaluate without IV contrast. No axillary adenopathy. Esophagus is grossly unremarkable. Lungs/Pleura: Mild biapical pleuroparenchymal scarring. Extensive bilateral peribronchovascular nodularity, bronchiectasis, nodular consolidation and mucoid impaction, progressive from 02/02/2016. Near complete collapse of the right middle lobe with associated bronchiectasis. Narrowing of the right middle lobe bronchus appears similar to 02/02/2016. Index nodular consolidation in the anterior segment right upper lobe measures 1.1 x 1.4 cm (8/69) with mild surrounding ground-glass. No pleural fluid. Airway is otherwise unremarkable. Upper Abdomen: Multiple low-attenuation lesions in the liver measure up to 1.7 cm in the left hepatic lobe, similar or smaller and likely cysts and/or hemangiomas. Stones layer  in the gallbladder. Adrenal glands and visualized portion of the right kidney are unremarkable. 11 mm low-density lesion the upper pole left kidney and 3.5 cm and low-attenuation lesion in the interpolar left kidney, compatible with cysts. No follow-up recommended. Visualized portions of the spleen, pancreas, stomach and bowel are grossly unremarkable. Musculoskeletal: Degenerative changes in the spine. Old left rib fracture. No worrisome lytic or sclerotic lesions. IMPRESSION: 1. Extensive bilateral peribronchovascular nodularity, bronchiectasis, nodular consolidation and mucoid impaction, progressive from 02/02/2016. Findings are likely due to mycobacterium avium complex. 2. Nodular consolidation in the anterior segment right upper lobe, likely infectious/inflammatory in etiology. Additional follow-up CT chest without contrast could be obtained in 4-6 weeks in further initial evaluation, as clinically indicated, as malignancy cannot be definitively excluded. 3. Right middle lobe collapse, new from 02/02/2016. Narrowing of the right middle lobe bronchus appears unchanged, however. 4. Cholelithiasis. 5. Aortic atherosclerosis (ICD10-I70.0). Coronary artery calcification. Electronically Signed   By: Lorin Picket M.D.   On: 02/16/2022 11:19    No results found.  No results found.    Assessment and Plan: Patient Active Problem List   Diagnosis Date Noted   Encounter for general adult medical examination with abnormal findings 08/08/2020   Other fatigue 08/08/2020   Need for vaccination against Streptococcus pneumoniae using pneumococcal conjugate vaccine 7 08/08/2020   Age related osteoporosis 10/15/2018   Ovarian failure 10/15/2018   Screening for breast cancer 04/29/2018   Essential hypertension 04/29/2018   Obstructive chronic bronchitis without exacerbation ([place]) 04/29/2018   MAI (mycobacterium avium-intracellulare) ([place]) 02/08/2018   Numbness and tingling 03/25/2015   Myopathy 02/19/2015    Polyneuropathy 02/18/2015   Weakness of both lower extremities 02/18/2015   1. SOB (shortness of breath)  - Spirometry with Graph  2. Obstructive chronic bronchitis without exacerbation (HCC) She has advanced disease however has been doing relatively well plan is going to continue with inhaler regimen as ordered and nebulizers as necessary.  In addition she has been on oxygen therapy   3. MAI (mycobacterium avium-intracellulare) infection ([place]) on antimycobacterial therapy this will be continued we will try to take her off once before and she actually had a decline in status now is back on her medication and she is doing better  4. Pulmonary nodules 's noted to have multiple pulmonary nodules these are new compared to scan from 2017.  I reviewed the CT scans with the patient and the patient's son in detail.  In addition there is a area of right middle lobe collapse.  This may represent progression of disease process versus malignancy.  I would recommend at this time to get a PET scan however they want to do a CT first and see if there has been any improvement.  If there has not been any improvement then the patient will need to have a PET scan for further evaluation -  CT Chest High Resolution; Future  5. Chronic respiratory failure with hypoxia (HCC) On oxygen therapy this should be continued we will continue to monitor closely.  6. Bronchiectasis without acute exacerbation ([place]) She has significant bronchiectasis.  Her home DME company suggested that she go on a chest vest.  While this may be appropriate in the diagnosis of acute bronchiectasis a rather chronic bronchiectasis the patient herself wants to hold off and defer this.  General Counseling: I have discussed the findings of the evaluation and examination with Dazja.  I have also discussed any further diagnostic evaluation thatmay be needed or ordered today. Naamah verbalizes understanding of the findings of todays visit.  We also reviewed her medications today and discussed drug interactions and side effects including but not limited excessive drowsiness and altered mental states. We also discussed that there is always a risk not just to her but also people around her. she has been encouraged to call the office with any questions or concerns that should arise related to todays visit.  Orders Placed This Encounter  Procedures   Spirometry with Graph    Order Specific Question:   Where should this test be performed?    Answer:   [facility]    Order Specific Question:   Basic spirometry    Answer:   Yes     Time spent: 73  I have personally obtained a history, examined the patient, evaluated laboratory and imaging results, formulated the assessment and plan and placed orders.    Allyne Gee, MD [facility] Pulmonary and Critical Care Sleep medicine

## 2022-05-31 NOTE — Telephone Encounter (Signed)
Lvm with patient's son to move up 7/19 appointment to 1:00 or move to Thursday 7/20-Toni

## 2022-05-31 NOTE — Patient Instructions (Signed)
Chronic Obstructive Pulmonary Disease  Chronic obstructive pulmonary disease (COPD) is a long-term (chronic) lung problem. When you have COPD, it is hard for air to get in and out of your lungs. Usually the condition gets worse over time, and your lungs will never return to normal. There are things you can do to keep yourself as healthy as possible. What are the causes? Smoking. This is the most common cause. Certain genes passed from parent to child (inherited). What increases the risk? Being exposed to secondhand smoke from cigarettes, pipes, or cigars. Being exposed to chemicals and other irritants, such as fumes and dust in the work environment. Having chronic lung conditions or infections. What are the signs or symptoms? Shortness of breath, especially during physical activity. A long-term cough with a large amount of thick mucus. Sometimes, the cough may not have any mucus (dry cough). Wheezing. Breathing quickly. Skin that looks gray or blue, especially in the fingers, toes, or lips. Feeling tired (fatigue). Weight loss. Chest tightness. Having infections often. Episodes when breathing symptoms become much worse (exacerbations). At the later stages of this disease, you may have swelling in the ankles, feet, or legs. How is this treated? Taking medicines. Quitting smoking, if you smoke. Rehabilitation. This includes steps to make your body work better. It may involve a team of specialists. Doing exercises. Making changes to your diet. Using oxygen. Lung surgery. Lung transplant. Comfort measures (palliative care). Follow these instructions at home: Medicines Take over-the-counter and prescription medicines only as told by your doctor. Talk to your doctor before taking any cough or allergy medicines. You may need to avoid medicines that cause your lungs to be dry. Lifestyle If you smoke, stop smoking. Smoking makes the problem worse. Do not smoke or use any products that  contain nicotine or tobacco. If you need help quitting, ask your doctor. Avoid being around things that make your breathing worse. This may include smoke, chemicals, and fumes. Stay active, but remember to rest as well. Learn and use tips on how to manage stress and control your breathing. Make sure you get enough sleep. Most adults need at least 7 hours of sleep every night. Eat healthy foods. Eat smaller meals more often. Rest before meals. Controlled breathing Learn and use tips on how to control your breathing as told by your doctor. Try: Breathing in (inhaling) through your nose for 1 second. Then, pucker your lips and breath out (exhale) through your lips for 2 seconds. Putting one hand on your belly (abdomen). Breathe in slowly through your nose for 1 second. Your hand on your belly should move out. Pucker your lips and breathe out slowly through your lips. Your hand on your belly should move in as you breathe out.  Controlled coughing Learn and use controlled coughing to clear mucus from your lungs. Follow these steps: Lean your head a little forward. Breathe in deeply. Try to hold your breath for 3 seconds. Keep your mouth slightly open while coughing 2 times. Spit any mucus out into a tissue. Rest and do the steps again 1 or 2 times as needed. General instructions Make sure you get all the shots (vaccines) that your doctor recommends. Ask your doctor about a flu shot and a pneumonia shot. Use oxygen therapy and pulmonary rehabilitation if told by your doctor. If you need home oxygen therapy, ask your doctor if you should buy a tool to measure your oxygen level (oximeter). Make a COPD action plan with your doctor. This helps you   to know what to do if you feel worse than usual. Manage any other conditions you have as told by your doctor. Avoid going outside when it is very hot, cold, or humid. Avoid people who have a sickness you can catch (contagious). Keep all follow-up  visits. Contact a doctor if: You cough up more mucus than usual. There is a change in the color or thickness of the mucus. It is harder to breathe than usual. Your breathing is faster than usual. You have trouble sleeping. You need to use your medicines more often than usual. You have trouble doing your normal activities such as getting dressed or walking around the house. Get help right away if: You have shortness of breath while resting. You have shortness of breath that stops you from: Being able to talk. Doing normal activities. Your chest hurts for longer than 5 minutes. Your skin color is more blue than usual. Your pulse oximeter shows that you have low oxygen for longer than 5 minutes. You have a fever. You feel too tired to breathe normally. These symptoms may represent a serious problem that is an emergency. Do not wait to see if the symptoms will go away. Get medical help right away. Call your local emergency services (911 in the [place] Do not drive yourself to the hospital. Summary Chronic obstructive pulmonary disease (COPD) is a long-term lung problem. The way your lungs work will never return to normal. Usually the condition gets worse over time. There are things you can do to keep yourself as healthy as possible. Take over-the-counter and prescription medicines only as told by your doctor. If you smoke, stop. Smoking makes the problem worse. This information is not intended to replace advice given to you by your health care provider. Make sure you discuss any questions you have with your health care provider. Document Revised: 09/15/2020 Document Reviewed: 09/15/2020 Elsevier Patient Education  2023 [employer]  

## 2022-06-01 ENCOUNTER — Telehealth: Payer: Medicare Other

## 2022-06-03 ENCOUNTER — Ambulatory Visit
Admission: RE | Admit: 2022-06-03 | Discharge: 2022-06-03 | Disposition: A | Discharge status: Home / Self Care | Payer: Medicare Other | Source: Ambulatory Visit | Attending: Internal Medicine | Admitting: Internal Medicine

## 2022-06-03 DIAGNOSIS — J479 Bronchiectasis, uncomplicated: Secondary | ICD-10-CM | POA: Diagnosis not present

## 2022-06-03 DIAGNOSIS — R918 Other nonspecific abnormal finding of lung field: Secondary | ICD-10-CM | POA: Insufficient documentation

## 2022-06-03 DIAGNOSIS — I2510 Atherosclerotic heart disease of native coronary artery without angina pectoris: Secondary | ICD-10-CM | POA: Diagnosis not present

## 2022-06-03 DIAGNOSIS — J9811 Atelectasis: Secondary | ICD-10-CM | POA: Diagnosis not present

## 2022-06-08 ENCOUNTER — Other Ambulatory Visit: Payer: Self-pay

## 2022-06-08 ENCOUNTER — Ambulatory Visit: Payer: Medicare Other | Admitting: Internal Medicine

## 2022-06-08 DIAGNOSIS — R918 Other nonspecific abnormal finding of lung field: Secondary | ICD-10-CM

## 2022-06-08 MED ORDER — ALBUTEROL SULFATE HFA 108 (90 BASE) MCG/ACT IN AERS: 2.0000 | INHALATION_SPRAY | Freq: Four times a day (QID) | RESPIRATORY_TRACT | 3 refills | Status: DC | PRN

## 2022-06-08 NOTE — Progress Notes (Signed)
[facility] [place],  [address]  Pulmonary Sleep Medicine   Office Visit Note  Patient Name: Breanna Christian DOB: 16-Oct-1941 MRN [id number]  Date of Service: 06/08/2022  Complaints/HPI: Spoke to the patient's son today regarding the findings of the CT scan of the chest.  The CT shows basically the same stable infiltrates may be slightly improved however the nodule shows to be a little bit bigger in the right upper lobe.  Discussed with him possibilities including infection versus malignancy.  The radiologist made a comment that if we do a PET scan it could be false positive and ultimately she may still need a tissue biopsy.  The son wanted to discuss this with the entire family before they come up with a plan.  He did wanted process of proceeding towards a diagnosis.  Explained to him that we would first need to do a PET scan to determine if the area of interest is hot and then after that we she may end up need to have a biopsy.  I think with the location of this nodule the best approach would be to do a needle biopsy.  They are planning on traveling to [place] and they will be gone    Current Medication: Outpatient Encounter Medications as of 06/08/2022  Medication Sig   ARTIFICIAL TEAR OP Place 1 drop into both eyes every 6 (six) hours as needed (dry eyes). A product from [place].   azithromycin (ZITHROMAX) 250 MG tablet TAKE 1 TABLET EVERY DAY   clotrimazole-betamethasone (LOTRISONE) cream APPLY TO AFFECTED AREA TWICE A DAY   ethambutol (MYAMBUTOL) 400 MG tablet TAKE 1 TABLET EVERY DAY   ibandronate (BONIVA) 150 MG tablet Take 1 tablet (150 mg total) by mouth every 30 (thirty) days. Take in the morning with a full glass of water, on an empty stomach, and do not take anything else by mouth or lie down for the next 30 min.   ibuprofen (ADVIL,MOTRIN) 200 MG tablet Take 200 mg by mouth as needed.   ipratropium-albuterol (DUONEB) 0.5-2.5 (3) MG/3ML SOLN Take 3  mLs by nebulization every 2 (two) hours as needed. DX: J44.9   Lifitegrast 5 % SOLN Apply 1 drop to eye in the morning and at bedtime. 1 Gtt both eyes twice a day   Multiple Vitamin (MULTI-VITAMINS) TABS Take 1 tablet by mouth daily. Reported on 04/25/2016   Omega-3 Fatty Acids (OMEGA-3 FISH OIL) 300 MG CAPS Take by mouth daily.   PROAIR HFA 108 (90 Base) MCG/ACT inhaler INHALE 2 PUFFS EVERY 6 HOURS AS NEEDED   rifampin (RIFADIN) 150 MG capsule TAKE 1 CAPSULE EVERY DAY   sodium chloride HYPERTONIC 3 % nebulizer solution Take by nebulization as needed for other.   SPIRIVA HANDIHALER 18 MCG inhalation capsule INHALE 1 CAPSULE VIA HANDIHALER ONCE DAILY   No facility-administered encounter medications on file as of 06/08/2022.    Surgical History: Past Surgical History:  Procedure Laterality Date   APPENDECTOMY     BRONCHOSCOPY  02/2016   CATARACT EXTRACTION W/PHACO Right 10/26/2016   Procedure: CATARACT EXTRACTION PHACO AND INTRAOCULAR LENS PLACEMENT ([place]);  Surgeon: Estill Cotta, MD;  Location: [facility] ORS;  Service: Ophthalmology;  Laterality: Right;  [place] 2.17 AP% 26.5 CDE 56.86 Fluid pack lot # [id number]   FLEXIBLE BRONCHOSCOPY Bilateral 03/16/2016   Procedure: FLEXIBLE BRONCHOSCOPY;  Surgeon: Allyne Gee, MD;  Location: [facility] ORS;  Service: Pulmonary;  Laterality: Bilateral;   FLEXIBLE BRONCHOSCOPY N/A 03/03/2017   Procedure: FLEXIBLE  BRONCHOSCOPY;  Surgeon: Allyne Gee, MD;  Location: [facility] ORS;  Service: Pulmonary;  Laterality: N/A;   TUBAL LIGATION      Medical History: Past Medical History:  Diagnosis Date   Bronchiectasis ([place]) 2011   COPD (chronic obstructive pulmonary disease) (HCC)    HOH (hard of hearing)    Hypertension    Lung nodules    Mycobacterium avium complex colonization    Non-Hodgkin lymphoma ([place]) 2003   Personal history of chemotherapy    Pneumonia    FREQUENT IN PAST    H/O MYCOBACTERIAL   Rheumatoid arthritis (HCC)    Sjogren's syndrome (HCC)     Xerostomia     Family History: Family History  Problem Relation Age of Onset   Cancer Sister    Breast cancer Neg Hx     Social History: Social History   Socioeconomic History   Marital status: Married    Spouse name: Not on file   Number of children: Not on file   Years of education: Not on file   Highest education level: Not on file  Occupational History   Not on file  Tobacco Use   Smoking status: Never   Smokeless tobacco: Never  Vaping Use   Vaping Use: Never used  Substance and Sexual Activity   Alcohol use: No   Drug use: No   Sexual activity: Not on file  Other Topics Concern   Not on file  Social History Narrative   Not on file   Social Determinants of Health   Financial Resource Strain: Not on file  Food Insecurity: Not on file  Transportation Needs: Not on file  Physical Activity: Not on file  Stress: Not on file  Social Connections: Not on file  Intimate Partner Violence: Not on file      Radiology: CT Chest High Resolution  Result Date: 06/05/2022 CLINICAL DATA:  81 year old female with history of pulmonary nodules. Follow-up study. EXAM: CT CHEST WITHOUT CONTRAST TECHNIQUE: Multidetector CT imaging of the chest was performed following the standard protocol without intravenous contrast. High resolution imaging of the lungs, as well as inspiratory and expiratory imaging, was performed. RADIATION DOSE REDUCTION: This exam was performed according to the departmental dose-optimization program which includes automated exposure control, adjustment of the mA and/or kV according to patient size and/or use of iterative reconstruction technique. COMPARISON:  Chest CT 02/15/2022. FINDINGS: Cardiovascular: Heart size is normal. There is no significant pericardial fluid, thickening or pericardial calcification. There is aortic atherosclerosis, as well as atherosclerosis of the great vessels of the mediastinum and the coronary arteries, including calcified  atherosclerotic plaque in the left main, left anterior descending and left circumflex coronary arteries. Severe calcifications of the aortic valve and mitral annulus. Mediastinum/Nodes: No pathologically enlarged mediastinal or hilar lymph nodes. Esophagus is unremarkable in appearance. No axillary lymphadenopathy. Lungs/Pleura: In the anterior aspect of the right upper lobe (axial image 64 of series 8) there is a 1.5 x 1.3 cm nodule with macrolobulated and slightly ill-defined spiculated margins, minimally increased compared to the prior study (previously 1.4 x 1.1 cm on 02/15/2022). Multiple other smaller nodular areas of architectural distortion are again noted throughout the lungs bilaterally, some of which are new compared to the prior examination, others of which are stable, and still others which show mild regression compared to the prior examination, likely related to a chronic indolent atypical infectious process. Complete chronic collapse of the right middle lobe where there is extensive cylindrical and varicose bronchiectasis. Other  widespread areas of mild cylindrical bronchiectasis and peripheral bronchiolectasis are noted throughout the lungs bilaterally. Diffuse thickening of the peribronchovascular interstitium with diffuse centrilobular ground-glass attenuation micro nodularity. No honeycombing. Inspiratory and expiratory imaging is unremarkable. Upper Abdomen: Aortic atherosclerosis. Incompletely imaged lesion in the interpolar region of the left kidney measuring at least 3.2 cm in diameter, not characterized on today's non-contrast CT examination, but similar to the prior study and statistically likely a cyst (no imaging follow-up is recommended). Likewise, multiple low-attenuation lesions scattered throughout the liver are similar to the prior examination, and although not characterized on today's non-contrast CT examination likely represent cysts and/or biliary hamartomas. Musculoskeletal: There  are no aggressive appearing lytic or blastic lesions noted in the visualized portions of the skeleton. IMPRESSION: 1. Overall, the appearance of the chest is very similar to prior examinations, most compatible with a chronic indolent atypical infectious process such as MAI (mycobacterium avium intracellulare). The dominant right upper lobe nodule continues to have an aggressive appearance and is minimally increased in size compared to the prior study, but may simply be part of this chronic infection. Close attention on follow-up studies is recommended, with repeat noncontrast chest CT in 3 months. Alternatively, if there is strong clinical concern for malignancy, PET-CT could be considered, however, given the high likelihood of a false positive examination in the setting of chronic infection, the specificity of such an examination is questionable. 2. Aortic atherosclerosis, in addition to left main and 2 vessel coronary artery disease. 3. There are calcifications of the aortic valve and mitral annulus. Echocardiographic correlation for evaluation of potential valvular dysfunction may be warranted if clinically indicated. Aortic Atherosclerosis (ICD10-I70.0). Electronically Signed   By: Vinnie Langton M.D.   On: 06/05/2022 13:30    No results found.  CT Chest High Resolution  Result Date: 06/05/2022 CLINICAL DATA:  81 year old female with history of pulmonary nodules. Follow-up study. EXAM: CT CHEST WITHOUT CONTRAST TECHNIQUE: Multidetector CT imaging of the chest was performed following the standard protocol without intravenous contrast. High resolution imaging of the lungs, as well as inspiratory and expiratory imaging, was performed. RADIATION DOSE REDUCTION: This exam was performed according to the departmental dose-optimization program which includes automated exposure control, adjustment of the mA and/or kV according to patient size and/or use of iterative reconstruction technique. COMPARISON:  Chest  CT 02/15/2022. FINDINGS: Cardiovascular: Heart size is normal. There is no significant pericardial fluid, thickening or pericardial calcification. There is aortic atherosclerosis, as well as atherosclerosis of the great vessels of the mediastinum and the coronary arteries, including calcified atherosclerotic plaque in the left main, left anterior descending and left circumflex coronary arteries. Severe calcifications of the aortic valve and mitral annulus. Mediastinum/Nodes: No pathologically enlarged mediastinal or hilar lymph nodes. Esophagus is unremarkable in appearance. No axillary lymphadenopathy. Lungs/Pleura: In the anterior aspect of the right upper lobe (axial image 64 of series 8) there is a 1.5 x 1.3 cm nodule with macrolobulated and slightly ill-defined spiculated margins, minimally increased compared to the prior study (previously 1.4 x 1.1 cm on 02/15/2022). Multiple other smaller nodular areas of architectural distortion are again noted throughout the lungs bilaterally, some of which are new compared to the prior examination, others of which are stable, and still others which show mild regression compared to the prior examination, likely related to a chronic indolent atypical infectious process. Complete chronic collapse of the right middle lobe where there is extensive cylindrical and varicose bronchiectasis. Other widespread areas of mild cylindrical bronchiectasis and peripheral bronchiolectasis  are noted throughout the lungs bilaterally. Diffuse thickening of the peribronchovascular interstitium with diffuse centrilobular ground-glass attenuation micro nodularity. No honeycombing. Inspiratory and expiratory imaging is unremarkable. Upper Abdomen: Aortic atherosclerosis. Incompletely imaged lesion in the interpolar region of the left kidney measuring at least 3.2 cm in diameter, not characterized on today's non-contrast CT examination, but similar to the prior study and statistically likely a cyst  (no imaging follow-up is recommended). Likewise, multiple low-attenuation lesions scattered throughout the liver are similar to the prior examination, and although not characterized on today's non-contrast CT examination likely represent cysts and/or biliary hamartomas. Musculoskeletal: There are no aggressive appearing lytic or blastic lesions noted in the visualized portions of the skeleton. IMPRESSION: 1. Overall, the appearance of the chest is very similar to prior examinations, most compatible with a chronic indolent atypical infectious process such as MAI (mycobacterium avium intracellulare). The dominant right upper lobe nodule continues to have an aggressive appearance and is minimally increased in size compared to the prior study, but may simply be part of this chronic infection. Close attention on follow-up studies is recommended, with repeat noncontrast chest CT in 3 months. Alternatively, if there is strong clinical concern for malignancy, PET-CT could be considered, however, given the high likelihood of a false positive examination in the setting of chronic infection, the specificity of such an examination is questionable. 2. Aortic atherosclerosis, in addition to left main and 2 vessel coronary artery disease. 3. There are calcifications of the aortic valve and mitral annulus. Echocardiographic correlation for evaluation of potential valvular dysfunction may be warranted if clinically indicated. Aortic Atherosclerosis (ICD10-I70.0). Electronically Signed   By: Vinnie Langton M.D.   On: 06/05/2022 13:30      Assessment and Plan: Patient Active Problem List   Diagnosis Date Noted   Encounter for general adult medical examination with abnormal findings 08/08/2020   Other fatigue 08/08/2020   Need for vaccination against Streptococcus pneumoniae using pneumococcal conjugate vaccine 7 08/08/2020   Age related osteoporosis 10/15/2018   Ovarian failure 10/15/2018   Screening for breast cancer  04/29/2018   Essential hypertension 04/29/2018   Obstructive chronic bronchitis without exacerbation ([place]) 04/29/2018   MAI (mycobacterium avium-intracellulare) ([place]) 02/08/2018   Numbness and tingling 03/25/2015   Myopathy 02/19/2015   Polyneuropathy 02/18/2015   Weakness of both lower extremities 02/18/2015    Pulmonary Nodule as noted we will schedule for a PET scan and then consider biopsy if the patient and family decide to go this route. Patient and family will discuss and decide regarding plan of care.  General Counseling: I have discussed the findings of the evaluation and examination with Breanna Christian.  I have also discussed any further diagnostic evaluation thatmay be needed or ordered today. Breanna Christian verbalizes understanding of the findings of todays visit. We also reviewed her medications today and discussed drug interactions and side effects including but not limited excessive drowsiness and altered mental states. We also discussed that there is always a risk not just to her but also people around her. she has been encouraged to call the office with any questions or concerns that should arise related to todays visit.  No orders of the defined types were placed in this encounter.    Time spent: 30  I have personally obtained a history, examined the patient, evaluated laboratory and imaging results, formulated the assessment and plan and placed orders.    Allyne Gee, MD [facility] Sleep medicine

## 2022-06-13 ENCOUNTER — Encounter
Admission: RE | Admit: 2022-06-13 | Discharge: 2022-06-13 | Disposition: A | Discharge status: Home / Self Care | Payer: Medicare Other | Source: Ambulatory Visit | Attending: Internal Medicine | Admitting: Internal Medicine

## 2022-06-13 DIAGNOSIS — R918 Other nonspecific abnormal finding of lung field: Secondary | ICD-10-CM | POA: Diagnosis not present

## 2022-06-13 LAB — GLUCOSE, CAPILLARY: Glucose-Capillary: 94 mg/dL (ref 70–99)

## 2022-06-13 MED ORDER — FLUDEOXYGLUCOSE F - 18 (FDG) INJECTION
6.5000 | Freq: Once | INTRAVENOUS | Status: AC | PRN
  Administered 2022-06-13: 7.01 via INTRAVENOUS

## 2022-06-16 ENCOUNTER — Other Ambulatory Visit: Payer: Medicare Other

## 2022-06-20 ENCOUNTER — Encounter: Payer: Self-pay | Admitting: Internal Medicine

## 2022-06-24 ENCOUNTER — Telehealth: Payer: Self-pay

## 2022-06-24 NOTE — Telephone Encounter (Signed)
Patient is in [place]. I called and spoke to son and let him know that we will discuss details on her return since she is there in [place] per Dr Devona Konig  Son advised that they got her medication situation taken care of with a family member going by their house and picking them up and bring when they travel to [place]

## 2022-08-01 DIAGNOSIS — E538 Deficiency of other specified B group vitamins: Secondary | ICD-10-CM | POA: Diagnosis not present

## 2022-08-01 DIAGNOSIS — E782 Mixed hyperlipidemia: Secondary | ICD-10-CM | POA: Diagnosis not present

## 2022-08-01 DIAGNOSIS — R7989 Other specified abnormal findings of blood chemistry: Secondary | ICD-10-CM | POA: Diagnosis not present

## 2022-08-01 DIAGNOSIS — R5383 Other fatigue: Secondary | ICD-10-CM | POA: Diagnosis not present

## 2022-08-01 DIAGNOSIS — E559 Vitamin D deficiency, unspecified: Secondary | ICD-10-CM | POA: Diagnosis not present

## 2022-08-02 LAB — LIPID PANEL WITH LDL/HDL RATIO
Cholesterol, Total: 168 mg/dL (ref 100–199)
HDL: 57 mg/dL (ref >39)
LDL Chol Calc (NIH): 96 mg/dL (ref 0–99)
LDL/HDL Ratio: 1.7 ratio (ref 0.0–3.2)
Triglycerides: 82 mg/dL (ref 0–149)
VLDL Cholesterol Cal: 15 mg/dL (ref 5–40)

## 2022-08-02 LAB — COMPREHENSIVE METABOLIC PANEL
ALT: 11 IU/L (ref 0–32)
AST: 20 IU/L (ref 0–40)
Albumin/Globulin Ratio: 1.8 (ref 1.2–2.2)
Albumin: 4.3 g/dL (ref 3.8–4.8)
Alkaline Phosphatase: 90 IU/L (ref 44–121)
BUN/Creatinine Ratio: 16 (ref 12–28)
BUN: 12 mg/dL (ref 8–27)
Bilirubin Total: 0.5 mg/dL (ref 0.0–1.2)
CO2: 26 mmol/L (ref 20–29)
Calcium: 9.7 mg/dL (ref 8.7–10.3)
Chloride: 98 mmol/L (ref 96–106)
Creatinine, Ser: 0.74 mg/dL (ref 0.57–1.00)
Globulin, Total: 2.4 g/dL (ref 1.5–4.5)
Glucose: 112 mg/dL — ABNORMAL HIGH (ref 70–99)
Potassium: 5.2 mmol/L (ref 3.5–5.2)
Sodium: 140 mmol/L (ref 134–144)
Total Protein: 6.7 g/dL (ref 6.0–8.5)
eGFR: 82 mL/min/{1.73_m2} (ref >59)

## 2022-08-02 LAB — CBC WITH DIFFERENTIAL/PLATELET
Basophils Absolute: 0 10*3/uL (ref 0.0–0.2)
Basos: 1 %
EOS (ABSOLUTE): 0 10*3/uL (ref 0.0–0.4)
Eos: 1 %
Hematocrit: 40.8 % (ref 34.0–46.6)
Hemoglobin: 13.3 g/dL (ref 11.1–15.9)
Immature Grans (Abs): 0 10*3/uL (ref 0.0–0.1)
Immature Granulocytes: 0 %
Lymphocytes Absolute: 0.7 10*3/uL (ref 0.7–3.1)
Lymphs: 20 %
MCH: 27.9 pg (ref 26.6–33.0)
MCHC: 32.6 g/dL (ref 31.5–35.7)
MCV: 86 fL (ref 79–97)
Monocytes Absolute: 0.4 10*3/uL (ref 0.1–0.9)
Monocytes: 13 %
Neutrophils Absolute: 2.2 10*3/uL (ref 1.4–7.0)
Neutrophils: 65 %
Platelets: 238 10*3/uL (ref 150–450)
RBC: 4.76 x10E6/uL (ref 3.77–5.28)
RDW: 13 % (ref 11.7–15.4)
WBC: 3.4 10*3/uL (ref 3.4–10.8)

## 2022-08-02 LAB — B12 AND FOLATE PANEL
Folate: 15.1 ng/mL (ref >3.0)
Vitamin B-12: 775 pg/mL (ref 232–1245)

## 2022-08-02 LAB — VITAMIN D 25 HYDROXY (VIT D DEFICIENCY, FRACTURES): Vit D, 25-Hydroxy: 62.5 ng/mL (ref 30.0–100.0)

## 2022-08-02 LAB — TSH+FREE T4
Free T4: 1.44 ng/dL (ref 0.82–1.77)
TSH: 0.733 u[IU]/mL (ref 0.450–4.500)

## 2022-08-10 ENCOUNTER — Telehealth: Payer: Self-pay

## 2022-08-10 NOTE — Telephone Encounter (Signed)
-----   Message from Mylinda Latina, PA-C sent at 08/09/2022  4:02 PM EDT ----- Please let her know that her except for an elevated glucose reading, her labs all looked good.

## 2022-08-10 NOTE — Telephone Encounter (Signed)
LMOM of son's VM that pt's labs looked good except an elevated glucose

## 2022-08-11 ENCOUNTER — Encounter: Payer: Self-pay | Admitting: Internal Medicine

## 2022-08-11 ENCOUNTER — Ambulatory Visit (INDEPENDENT_AMBULATORY_CARE_PROVIDER_SITE_OTHER): Payer: Medicare Other | Admitting: Internal Medicine

## 2022-08-11 VITALS — BP 115/62 | HR 102 | Temp 97.8°F | Resp 16 | Ht 61.0 in | Wt 117.2 lb

## 2022-08-11 DIAGNOSIS — R918 Other nonspecific abnormal finding of lung field: Secondary | ICD-10-CM

## 2022-08-11 DIAGNOSIS — J9611 Chronic respiratory failure with hypoxia: Secondary | ICD-10-CM

## 2022-08-11 DIAGNOSIS — J449 Chronic obstructive pulmonary disease, unspecified: Secondary | ICD-10-CM | POA: Diagnosis not present

## 2022-08-11 DIAGNOSIS — J479 Bronchiectasis, uncomplicated: Secondary | ICD-10-CM

## 2022-08-11 DIAGNOSIS — A310 Pulmonary mycobacterial infection: Secondary | ICD-10-CM

## 2022-08-11 DIAGNOSIS — J4489 Other specified chronic obstructive pulmonary disease: Secondary | ICD-10-CM

## 2022-08-11 NOTE — Progress Notes (Signed)
[facility] [place], [place] [address]  Pulmonary Sleep Medicine   Office Visit Note  Patient Name: Breanna Christian DOB: 1941/08/29 MRN [id number]  Date of Service: 08/11/2022  Complaints/HPI: She has returned from [place]. Had to attend funeral of her sisiter. She had some flu like symptoms. Some diarrhea gastroenteritis. As far as her scan is concerned she had a nodule noted.  We discussed the findings in detail with the son as well as the patient.  At this time they would like to do a follow-up PET scan to see if there has been any change since the last scan.  Patient has been having some shortness of breath little bit of cough and congestion.  Denies having any fevers or chills.  ROS  General: (-) fever, (-) chills, (-) night sweats, (-) weakness Skin: (-) rashes, (-) itching,. Eyes: (-) visual changes, (-) redness, (-) itching. Nose and Sinuses: (-) nasal stuffiness or itchiness, (-) postnasal drip, (-) nosebleeds, (-) sinus trouble. Mouth and Throat: (-) sore throat, (-) hoarseness. Neck: (-) swollen glands, (-) enlarged thyroid, (-) neck pain. Respiratory: + cough, (-) bloody sputum, + shortness of breath, - wheezing. Cardiovascular: - ankle swelling, (-) chest pain. Lymphatic: (-) lymph node enlargement. Neurologic: (-) numbness, (-) tingling. Psychiatric: (-) anxiety, (-) depression   Current Medication: Outpatient Encounter Medications as of 08/11/2022  Medication Sig   albuterol (PROAIR HFA) 108 (90 Base) MCG/ACT inhaler Inhale 2 puffs into the lungs every 6 (six) hours as needed.   ARTIFICIAL TEAR OP Place 1 drop into both eyes every 6 (six) hours as needed (dry eyes). A product from [place].   azithromycin (ZITHROMAX) 250 MG tablet TAKE 1 TABLET EVERY DAY   clotrimazole-betamethasone (LOTRISONE) cream APPLY TO AFFECTED AREA TWICE A DAY   ethambutol (MYAMBUTOL) 400 MG tablet TAKE 1 TABLET EVERY DAY   ibandronate (BONIVA) 150 MG tablet Take 1  tablet (150 mg total) by mouth every 30 (thirty) days. Take in the morning with a full glass of water, on an empty stomach, and do not take anything else by mouth or lie down for the next 30 min.   ibuprofen (ADVIL,MOTRIN) 200 MG tablet Take 200 mg by mouth as needed.   ipratropium-albuterol (DUONEB) 0.5-2.5 (3) MG/3ML SOLN Take 3 mLs by nebulization every 2 (two) hours as needed. DX: J44.9   Lifitegrast 5 % SOLN Apply 1 drop to eye in the morning and at bedtime. 1 Gtt both eyes twice a day   Multiple Vitamin (MULTI-VITAMINS) TABS Take 1 tablet by mouth daily. Reported on 04/25/2016   Omega-3 Fatty Acids (OMEGA-3 FISH OIL) 300 MG CAPS Take by mouth daily.   rifampin (RIFADIN) 150 MG capsule TAKE 1 CAPSULE EVERY DAY   sodium chloride HYPERTONIC 3 % nebulizer solution Take by nebulization as needed for other.   SPIRIVA HANDIHALER 18 MCG inhalation capsule INHALE 1 CAPSULE VIA HANDIHALER ONCE DAILY   No facility-administered encounter medications on file as of 08/11/2022.    Surgical History: Past Surgical History:  Procedure Laterality Date   APPENDECTOMY     BRONCHOSCOPY  02/2016   CATARACT EXTRACTION W/PHACO Right 10/26/2016   Procedure: CATARACT EXTRACTION PHACO AND INTRAOCULAR LENS PLACEMENT ([place]);  Surgeon: Estill Cotta, MD;  Location: [facility] ORS;  Service: Ophthalmology;  Laterality: Right;  [place] 2.17 AP% 26.5 CDE 56.86 Fluid pack lot # [id number]   FLEXIBLE BRONCHOSCOPY Bilateral 03/16/2016   Procedure: FLEXIBLE BRONCHOSCOPY;  Surgeon: Allyne Gee, MD;  Location: [facility] OR[facility];  Service: Pulmonary;  Laterality: Bilateral;   FLEXIBLE BRONCHOSCOPY N/A 03/03/2017   Procedure: FLEXIBLE BRONCHOSCOPY;  Surgeon: Allyne Gee, MD;  Location: [facility] ORS;  Service: Pulmonary;  Laterality: N/A;   TUBAL LIGATION      Medical History: Past Medical History:  Diagnosis Date   Bronchiectasis ([place]) 2011   COPD (chronic obstructive pulmonary disease) (HCC)    HOH (hard of hearing)    Hypertension     Lung nodules    Mycobacterium avium complex colonization    Non-Hodgkin lymphoma ([place]) 2003   Personal history of chemotherapy    Pneumonia    FREQUENT IN PAST    H/O MYCOBACTERIAL   Rheumatoid arthritis (HCC)    Sjogren's syndrome (HCC)    Xerostomia     Family History: Family History  Problem Relation Age of Onset   Cancer Sister    Breast cancer Neg Hx     Social History: Social History   Socioeconomic History   Marital status: Married    Spouse name: Not on file   Number of children: Not on file   Years of education: Not on file   Highest education level: Not on file  Occupational History   Not on file  Tobacco Use   Smoking status: Never   Smokeless tobacco: Never  Vaping Use   Vaping Use: Never used  Substance and Sexual Activity   Alcohol use: No   Drug use: No   Sexual activity: Not on file  Other Topics Concern   Not on file  Social History Narrative   Not on file   Social Determinants of Health   Financial Resource Strain: Not on file  Food Insecurity: Not on file  Transportation Needs: Not on file  Physical Activity: Not on file  Stress: Not on file  Social Connections: Not on file  Intimate Partner Violence: Not on file    Vital Signs: Blood pressure 115/62, pulse (!) 102, temperature 97.8 F (36.6 C), resp. rate 16, height _0  (1.549 m), weight 117 lb 3.2 oz (53.2 kg), SpO2 95 %.  Examination: General Appearance: The patient is well-developed, well-nourished, and in no distress. Skin: Gross inspection of skin unremarkable. Head: normocephalic, no gross deformities. Eyes: no gross deformities noted. ENT: ears appear grossly normal no exudates. Neck: Supple. No thyromegaly. No LAD. Respiratory: few rhonchi noted. Cardiovascular: Normal S1 and S2 without murmur or rub. Extremities: No cyanosis. pulses are equal. Neurologic: Alert and oriented. No involuntary movements.  LABS: Recent Results (from the past 2160 hour(s))  Glucose,  capillary     Status: None   Collection Time: 06/13/22 11:40 AM  Result Value Ref Range   Glucose-Capillary 94 70 - 99 mg/dL    Comment: Glucose reference range applies only to samples taken after fasting for at least 8 hours.  CBC w/Diff/Platelet     Status: None   Collection Time: 08/01/22 11:47 AM  Result Value Ref Range   WBC 3.4 3.4 - 10.8 x10E3/uL   RBC 4.76 3.77 - 5.28 x10E6/uL   Hemoglobin 13.3 11.1 - 15.9 g/dL   Hematocrit 40.8 34.0 - 46.6 %   MCV 86 79 - 97 fL   MCH 27.9 26.6 - 33.0 pg   MCHC 32.6 31.5 - 35.7 g/dL   RDW 13.0 11.7 - 15.4 %   Platelets 238 150 - 450 x10E3/uL   Neutrophils 65 Not Estab. %   Lymphs 20 Not Estab. %   Monocytes 13 Not Estab. %   Eos  1 Not Estab. %   Basos 1 Not Estab. %   Neutrophils Absolute 2.2 1.4 - 7.0 x10E3/uL   Lymphocytes Absolute 0.7 0.7 - 3.1 x10E3/uL   Monocytes Absolute 0.4 0.1 - 0.9 x10E3/uL   EOS (ABSOLUTE) 0.0 0.0 - 0.4 x10E3/uL   Basophils Absolute 0.0 0.0 - 0.2 x10E3/uL   Immature Granulocytes 0 Not Estab. %   Immature Grans (Abs) 0.0 0.0 - 0.1 x10E3/uL  Comprehensive metabolic panel     Status: Abnormal   Collection Time: 08/01/22 11:47 AM  Result Value Ref Range   Glucose 112 (H) 70 - 99 mg/dL   BUN 12 8 - 27 mg/dL   Creatinine, Ser 0.74 0.57 - 1.00 mg/dL   eGFR 82 >59 mL/min/1.73   BUN/Creatinine Ratio 16 12 - 28   Sodium 140 134 - 144 mmol/L   Potassium 5.2 3.5 - 5.2 mmol/L   Chloride 98 96 - 106 mmol/L   CO2 26 20 - 29 mmol/L   Calcium 9.7 8.7 - 10.3 mg/dL   Total Protein 6.7 6.0 - 8.5 g/dL   Albumin 4.3 3.8 - 4.8 g/dL   Globulin, Total 2.4 1.5 - 4.5 g/dL   Albumin/Globulin Ratio 1.8 1.2 - 2.2   Bilirubin Total 0.5 0.0 - 1.2 mg/dL   Alkaline Phosphatase 90 44 - 121 IU/L   AST 20 0 - 40 IU/L   ALT 11 0 - 32 IU/L  TSH + free T4     Status: None   Collection Time: 08/01/22 11:47 AM  Result Value Ref Range   TSH 0.733 0.450 - 4.500 uIU/mL   Free T4 1.44 0.82 - 1.77 ng/dL  Lipid Panel With LDL/HDL Ratio      Status: None   Collection Time: 08/01/22 11:47 AM  Result Value Ref Range   Cholesterol, Total 168 100 - 199 mg/dL   Triglycerides 82 0 - 149 mg/dL   HDL 57 >39 mg/dL   VLDL Cholesterol Cal 15 5 - 40 mg/dL   LDL Chol Calc (NIH) 96 0 - 99 mg/dL   LDL/HDL Ratio 1.7 0.0 - 3.2 ratio    Comment:                                     LDL/HDL Ratio                                             Men  Women                               1/2 Avg.Risk  1.0    1.5                                   Avg.Risk  3.6    3.2                                2X Avg.Risk  6.2    5.0  3X Avg.Risk  8.0    6.1   VITAMIN D 25 Hydroxy (Vit-D Deficiency, Fractures)     Status: None   Collection Time: 08/01/22 11:47 AM  Result Value Ref Range   Vit D, 25-Hydroxy 62.5 30.0 - 100.0 ng/mL    Comment: Vitamin D deficiency has been defined by the [place] practice guideline as a level of serum 25-OH vitamin D less than 20 ng/mL (1,2). The Endocrine Society went on to further define vitamin D insufficiency as a level between 21 and 29 ng/mL (2). 1. IOM (Institute of Medicine). 2010. Dietary reference    intakes for calcium and D. [place]: The    [employer]. 2. Holick MF, Binkley [place], Bischoff-Ferrari HA, et al.    Evaluation, treatment, and prevention of vitamin D    deficiency: an Endocrine Society clinical practice    guideline. JCEM. 2011 Jul; 96(7):1911-30.   B12 and Folate Panel     Status: None   Collection Time: 08/01/22 11:47 AM  Result Value Ref Range   Vitamin B-12 775 232 - 1,245 pg/mL   Folate 15.1 >3.0 ng/mL    Comment: A serum folate concentration of less than 3.1 ng/mL is considered to represent clinical deficiency.     Radiology: NM PET Image Initial (PI) Skull Base To Thigh (F-18 FDG)  Result Date: 06/13/2022 CLINICAL DATA:  Initial treatment strategy for pulmonary nodules recent chest CT. EXAM: NUCLEAR  MEDICINE PET SKULL BASE TO THIGH TECHNIQUE: 7.01 mCi F-18 FDG was injected intravenously. Full-ring PET imaging was performed from the skull base to thigh after the radiotracer. CT data was obtained and used for attenuation correction and anatomic localization. Fasting blood glucose: 94 mg/dl COMPARISON:  Chest CT 06/03/2022 FINDINGS: Mediastinal blood pool activity: SUV max 1.65 Liver activity: SUV max NA NECK: No hypermetabolic lymph nodes in the neck. Incidental CT findings: none CHEST: Stable extensive lung disease. Micronodular centrilobar pattern which is upper lobe predominant likely suggests respiratory bronchiolitis peer Persistent extensive tree-in-bud type changes with micro nodularity, peribronchial thickening and being a airspace opacity likely reflecting chronic inflammation or atypical infection such as MAC. Stable dense area of scarring type change and bronchiectasis in right middle lobe. Moderate hypermetabolism with SUV max of 4.28. Chronic inflammation, low-grade infection or possible tumor. The somewhat spiculated appearing right upper lobe nodule measures 12 mm and the SUV max is 2.48. Adjacent right upper lobe peripheral opacity has an SUV max of 2.34. Peripheral right lower lobe subsolid opacity has an SUV max of 1.28. Nodular lesion in the lingula measures 13 mm and has an SUV max of 1.92. No enlarged or hypermetabolic mediastinal or hilar lymph nodes. No hypermetabolic breast masses, supraclavicular axillary adenopathy. Incidental CT findings: Stable vascular calcifications. ABDOMEN/PELVIS: No abnormal hypermetabolic activity within the liver, pancreas, adrenal glands, or spleen. No hypermetabolic lymph nodes in the abdomen or pelvis. Incidental CT findings: Scattered hepatic cysts. Stable cholelithiasis. Stable vascular calcifications. SKELETON: No focal hypermetabolic activity to suggest skeletal metastasis. Incidental CT findings: none IMPRESSION: 1. Challenging PET-CT due to severe  chronic inflammatory or atypical infectious findings likely underlying MAC. The most worrisome finding is the dense right middle lobe consolidation with moderate FDG uptake peripherally. This may require biopsy. 2. The 12 mm right upper lobe lesion is indeterminate but somewhat suspicious. Continued close follow-up suggested. 3. I think the other lesions are areas of chronic inflammation/infection given the SUV uptake is similar to background mediastinal activity. 4. No mediastinal  hilar adenopathy or metastatic disease involving the abdomen/pelvis or bony structures. Electronically Signed   By: Marijo Sanes M.D.   On: 06/13/2022 13:38    No results found.  No results found.    Assessment and Plan: Patient Active Problem List   Diagnosis Date Noted   Encounter for general adult medical examination with abnormal findings 08/08/2020   Other fatigue 08/08/2020   Need for vaccination against Streptococcus pneumoniae using pneumococcal conjugate vaccine 7 08/08/2020   Age related osteoporosis 10/15/2018   Ovarian failure 10/15/2018   Screening for breast cancer 04/29/2018   Essential hypertension 04/29/2018   Obstructive chronic bronchitis without exacerbation ([place]) 04/29/2018   MAI (mycobacterium avium-intracellulare) ([place]) 02/08/2018   Numbness and tingling 03/25/2015   Myopathy 02/19/2015   Polyneuropathy 02/18/2015   Weakness of both lower extremities 02/18/2015   1. Pulmonary nodules We will do follow-up PET scan per request of the family. - NM PET Image Initial (PI) Skull Base To Thigh (F-18 FDG); Future  2. Obstructive chronic bronchitis without exacerbation ([place]) Seems to be under control continue with current inhalers and medications  3. MAI (mycobacterium avium-intracellulare) infection ([place]) On medications we will continue to manage  4. Chronic respiratory failure with hypoxia (HCC) Oxygen therapy as prescribed.  5. Bronchiectasis without acute exacerbation ([place]) No  acute exacerbation noted at this time patient appears to be at her baseline  General Counseling: I have discussed the findings of the evaluation and examination with [place].  I have also discussed any further diagnostic evaluation thatmay be needed or ordered today. Lille verbalizes understanding of the findings of todays visit. We also reviewed her medications today and discussed drug interactions and side effects including but not limited excessive drowsiness and altered mental states. We also discussed that there is always a risk not just to her but also people around her. she has been encouraged to call the office with any questions or concerns that should arise related to todays visit.  Orders Placed This Encounter  Procedures   NM PET Image Initial (PI) Skull Base To Thigh (F-18 FDG)    Standing Status:   Future    Number of Occurrences:   1    Standing Expiration Date:   08/12/2023    Order Specific Question:   If indicated for the ordered procedure, I authorize the administration of a radiopharmaceutical per Radiology protocol    Answer:   Yes    Order Specific Question:   Preferred imaging location?    Answer:   [facility]     Time spent: 31  I have personally obtained a history, examined the patient, evaluated laboratory and imaging results, formulated the assessment and plan and placed orders.    Allyne Gee, MD [facility] Pulmonary and Critical Care Sleep medicine

## 2022-08-11 NOTE — Patient Instructions (Signed)
Chronic Obstructive Pulmonary Disease  Chronic obstructive pulmonary disease (COPD) is a long-term (chronic) lung problem. When you have COPD, it is hard for air to get in and out of your lungs. Usually the condition gets worse over time, and your lungs will never return to normal. There are things you can do to keep yourself as healthy as possible. What are the causes? Smoking. This is the most common cause. Certain genes passed from parent to child (inherited). What increases the risk? Being exposed to secondhand smoke from cigarettes, pipes, or cigars. Being exposed to chemicals and other irritants, such as fumes and dust in the work environment. Having chronic lung conditions or infections. What are the signs or symptoms? Shortness of breath, especially during physical activity. A long-term cough with a large amount of thick mucus. Sometimes, the cough may not have any mucus (dry cough). Wheezing. Breathing quickly. Skin that looks gray or blue, especially in the fingers, toes, or lips. Feeling tired (fatigue). Weight loss. Chest tightness. Having infections often. Episodes when breathing symptoms become much worse (exacerbations). At the later stages of this disease, you may have swelling in the ankles, feet, or legs. How is this treated? Taking medicines. Quitting smoking, if you smoke. Rehabilitation. This includes steps to make your body work better. It may involve a team of specialists. Doing exercises. Making changes to your diet. Using oxygen. Lung surgery. Lung transplant. Comfort measures (palliative care). Follow these instructions at home: Medicines Take over-the-counter and prescription medicines only as told by your doctor. Talk to your doctor before taking any cough or allergy medicines. You may need to avoid medicines that cause your lungs to be dry. Lifestyle If you smoke, stop smoking. Smoking makes the problem worse. Do not smoke or use any products that  contain nicotine or tobacco. If you need help quitting, ask your doctor. Avoid being around things that make your breathing worse. This may include smoke, chemicals, and fumes. Stay active, but remember to rest as well. Learn and use tips on how to manage stress and control your breathing. Make sure you get enough sleep. Most adults need at least 7 hours of sleep every night. Eat healthy foods. Eat smaller meals more often. Rest before meals. Controlled breathing Learn and use tips on how to control your breathing as told by your doctor. Try: Breathing in (inhaling) through your nose for 1 second. Then, pucker your lips and breath out (exhale) through your lips for 2 seconds. Putting one hand on your belly (abdomen). Breathe in slowly through your nose for 1 second. Your hand on your belly should move out. Pucker your lips and breathe out slowly through your lips. Your hand on your belly should move in as you breathe out.  Controlled coughing Learn and use controlled coughing to clear mucus from your lungs. Follow these steps: Lean your head a little forward. Breathe in deeply. Try to hold your breath for 3 seconds. Keep your mouth slightly open while coughing 2 times. Spit any mucus out into a tissue. Rest and do the steps again 1 or 2 times as needed. General instructions Make sure you get all the shots (vaccines) that your doctor recommends. Ask your doctor about a flu shot and a pneumonia shot. Use oxygen therapy and pulmonary rehabilitation if told by your doctor. If you need home oxygen therapy, ask your doctor if you should buy a tool to measure your oxygen level (oximeter). Make a COPD action plan with your doctor. This helps you   to know what to do if you feel worse than usual. Manage any other conditions you have as told by your doctor. Avoid going outside when it is very hot, cold, or humid. Avoid people who have a sickness you can catch (contagious). Keep all follow-up  visits. Contact a doctor if: You cough up more mucus than usual. There is a change in the color or thickness of the mucus. It is harder to breathe than usual. Your breathing is faster than usual. You have trouble sleeping. You need to use your medicines more often than usual. You have trouble doing your normal activities such as getting dressed or walking around the house. Get help right away if: You have shortness of breath while resting. You have shortness of breath that stops you from: Being able to talk. Doing normal activities. Your chest hurts for longer than 5 minutes. Your skin color is more blue than usual. Your pulse oximeter shows that you have low oxygen for longer than 5 minutes. You have a fever. You feel too tired to breathe normally. These symptoms may represent a serious problem that is an emergency. Do not wait to see if the symptoms will go away. Get medical help right away. Call your local emergency services (911 in the [place] Do not drive yourself to the hospital. Summary Chronic obstructive pulmonary disease (COPD) is a long-term lung problem. The way your lungs work will never return to normal. Usually the condition gets worse over time. There are things you can do to keep yourself as healthy as possible. Take over-the-counter and prescription medicines only as told by your doctor. If you smoke, stop. Smoking makes the problem worse. This information is not intended to replace advice given to you by your health care provider. Make sure you discuss any questions you have with your health care provider. Document Revised: 09/15/2020 Document Reviewed: 09/15/2020 Elsevier Patient Education  2023 [employer]  

## 2022-08-25 ENCOUNTER — Ambulatory Visit
Admission: RE | Admit: 2022-08-25 | Discharge: 2022-08-25 | Disposition: A | Discharge status: Home / Self Care | Payer: Medicare Other | Source: Ambulatory Visit | Attending: Internal Medicine | Admitting: Internal Medicine

## 2022-08-25 DIAGNOSIS — R911 Solitary pulmonary nodule: Secondary | ICD-10-CM | POA: Diagnosis not present

## 2022-08-25 DIAGNOSIS — K7689 Other specified diseases of liver: Secondary | ICD-10-CM | POA: Insufficient documentation

## 2022-08-25 DIAGNOSIS — K8020 Calculus of gallbladder without cholecystitis without obstruction: Secondary | ICD-10-CM | POA: Diagnosis not present

## 2022-08-25 DIAGNOSIS — I700 Atherosclerosis of aorta: Secondary | ICD-10-CM | POA: Diagnosis not present

## 2022-08-25 DIAGNOSIS — J479 Bronchiectasis, uncomplicated: Secondary | ICD-10-CM | POA: Diagnosis not present

## 2022-08-25 DIAGNOSIS — R918 Other nonspecific abnormal finding of lung field: Secondary | ICD-10-CM | POA: Diagnosis not present

## 2022-08-25 DIAGNOSIS — R059 Cough, unspecified: Secondary | ICD-10-CM | POA: Insufficient documentation

## 2022-08-25 LAB — GLUCOSE, CAPILLARY: Glucose-Capillary: 90 mg/dL (ref 70–99)

## 2022-08-25 MED ORDER — FLUDEOXYGLUCOSE F - 18 (FDG) INJECTION
6.1000 | Freq: Once | INTRAVENOUS | Status: AC
  Administered 2022-08-25: 6.51 via INTRAVENOUS

## 2022-09-08 ENCOUNTER — Ambulatory Visit: Payer: Medicare Other | Admitting: Internal Medicine

## 2022-09-12 ENCOUNTER — Ambulatory Visit (INDEPENDENT_AMBULATORY_CARE_PROVIDER_SITE_OTHER): Payer: Medicare Other | Admitting: Physician Assistant

## 2022-09-12 ENCOUNTER — Encounter: Payer: Self-pay | Admitting: Physician Assistant

## 2022-09-12 VITALS — BP 123/67 | HR 95 | Temp 98.4°F | Resp 16 | Ht 61.0 in | Wt 119.0 lb

## 2022-09-12 DIAGNOSIS — J4489 Other specified chronic obstructive pulmonary disease: Secondary | ICD-10-CM | POA: Diagnosis not present

## 2022-09-12 DIAGNOSIS — R300 Dysuria: Secondary | ICD-10-CM | POA: Diagnosis not present

## 2022-09-12 DIAGNOSIS — Z23 Encounter for immunization: Secondary | ICD-10-CM

## 2022-09-12 DIAGNOSIS — Z0001 Encounter for general adult medical examination with abnormal findings: Secondary | ICD-10-CM

## 2022-09-12 NOTE — Progress Notes (Signed)
[facility] [place], [name] [address]  Internal MEDICINE  Office Visit Note  Patient Name: Breanna Christian  [id number]  [id number]  Date of Service: 09/25/2022  Chief Complaint  Patient presents with   Medicare Wellness   Hypertension     HPI Pt is here for routine health maintenance examination, her son is with her -Having pet scans with pulmonology and monitoring nodule. Sees pulmonology regularly -Doing well today -recently returned from [place] -Would like flu shot today -Will have covid booster in a few weeks -labs reviwed and overall look good, did have slightly elevated glucose. Admits she has been eating more sweets, especially oreos and will work to [employer]. Discussed regular meals to prevent lows, but to lessen sugary options  Current Medication: Outpatient Encounter Medications as of 09/12/2022  Medication Sig   albuterol (PROAIR HFA) 108 (90 Base) MCG/ACT inhaler Inhale 2 puffs into the lungs every 6 (six) hours as needed.   ARTIFICIAL TEAR OP Place 1 drop into both eyes every 6 (six) hours as needed (dry eyes). A product from [place].   azithromycin (ZITHROMAX) 250 MG tablet TAKE 1 TABLET EVERY DAY   clotrimazole-betamethasone (LOTRISONE) cream APPLY TO AFFECTED AREA TWICE A DAY   ethambutol (MYAMBUTOL) 400 MG tablet TAKE 1 TABLET EVERY DAY   ibuprofen (ADVIL,MOTRIN) 200 MG tablet Take 200 mg by mouth as needed.   ipratropium-albuterol (DUONEB) 0.5-2.5 (3) MG/3ML SOLN Take 3 mLs by nebulization every 2 (two) hours as needed. DX: J44.9   Lifitegrast 5 % SOLN Apply 1 drop to eye in the morning and at bedtime. 1 Gtt both eyes twice a day   Multiple Vitamin (MULTI-VITAMINS) TABS Take 1 tablet by mouth daily. Reported on 04/25/2016   Omega-3 Fatty Acids (OMEGA-3 FISH OIL) 300 MG CAPS Take by mouth daily.   rifampin (RIFADIN) 150 MG capsule TAKE 1 CAPSULE EVERY DAY   sodium chloride HYPERTONIC 3 % nebulizer solution Take by nebulization  as needed for other.   SPIRIVA HANDIHALER 18 MCG inhalation capsule INHALE 1 CAPSULE VIA HANDIHALER ONCE DAILY   [DISCONTINUED] ibandronate (BONIVA) 150 MG tablet Take 1 tablet (150 mg total) by mouth every 30 (thirty) days. Take in the morning with a full glass of water, on an empty stomach, and do not take anything else by mouth or lie down for the next 30 min.   No facility-administered encounter medications on file as of 09/12/2022.    Surgical History: Past Surgical History:  Procedure Laterality Date   APPENDECTOMY     BRONCHOSCOPY  02/2016   CATARACT EXTRACTION W/PHACO Right 10/26/2016   Procedure: CATARACT EXTRACTION PHACO AND INTRAOCULAR LENS PLACEMENT ([place]);  Surgeon: Estill Cotta, MD;  Location: [facility] ORS;  Service: Ophthalmology;  Laterality: Right;  [place] 2.17 AP% 26.5 CDE 56.86 Fluid pack lot # [id number]   FLEXIBLE BRONCHOSCOPY Bilateral 03/16/2016   Procedure: FLEXIBLE BRONCHOSCOPY;  Surgeon: Allyne Gee, MD;  Location: [facility] ORS;  Service: Pulmonary;  Laterality: Bilateral;   FLEXIBLE BRONCHOSCOPY N/A 03/03/2017   Procedure: FLEXIBLE BRONCHOSCOPY;  Surgeon: Allyne Gee, MD;  Location: [facility] ORS;  Service: Pulmonary;  Laterality: N/A;   TUBAL LIGATION      Medical History: Past Medical History:  Diagnosis Date   Bronchiectasis ([place]) 2011   COPD (chronic obstructive pulmonary disease) ([place])    HOH (hard of hearing)    Hypertension    Lung nodules    Mycobacterium avium complex colonization    Non-Hodgkin lymphoma ([place]) 2003  Personal history of chemotherapy    Pneumonia    FREQUENT IN PAST    H/O MYCOBACTERIAL   Rheumatoid arthritis ([place])    Sjogren's syndrome (HCC)    Xerostomia     Family History: Family History  Problem Relation Age of Onset   Cancer Sister    Breast cancer Neg Hx       Review of Systems  Constitutional:  Negative for chills, fatigue and unexpected weight change.  HENT:  Negative for congestion, postnasal drip, rhinorrhea,  sneezing and sore throat.   Eyes:  Negative for redness.  Respiratory:  Positive for cough. Negative for chest tightness and shortness of breath.   Cardiovascular:  Negative for chest pain and palpitations.  Gastrointestinal:  Negative for abdominal pain, constipation, diarrhea, nausea and vomiting.  Genitourinary:  Negative for dysuria and frequency.  Musculoskeletal:  Negative for arthralgias, back pain, joint swelling and neck pain.  Skin:  Negative for rash.  Neurological: Negative.  Negative for tremors and numbness.  Hematological:  Negative for adenopathy. Does not bruise/bleed easily.  Psychiatric/Behavioral:  Negative for behavioral problems (Depression), sleep disturbance and suicidal ideas. The patient is not nervous/anxious.      Vital Signs: BP 123/67   Pulse 95   Temp 98.4 F (36.9 C)   Resp 16   Ht _0  (1.549 m)   Wt 119 lb (54 kg)   SpO2 98%   BMI 22.48 kg/m    Physical Exam Vitals and nursing note reviewed.  Constitutional:      Appearance: Normal appearance. She is normal weight.  HENT:     Head: Normocephalic and atraumatic.     Nose: Nose normal.     Mouth/Throat:     Mouth: Mucous membranes are moist.     Pharynx: No posterior oropharyngeal erythema.  Eyes:     Extraocular Movements: Extraocular movements intact.     Pupils: Pupils are equal, round, and reactive to light.  Cardiovascular:     Rate and Rhythm: Normal rate and regular rhythm.     Pulses: Normal pulses.     Heart sounds: Normal heart sounds.  Pulmonary:     Effort: Pulmonary effort is normal. No respiratory distress.     Breath sounds: Rhonchi present. No wheezing or rales.  Musculoskeletal:        General: Normal range of motion.  Skin:    General: Skin is warm and dry.  Neurological:     General: No focal deficit present.     Mental Status: She is alert.  Psychiatric:        Mood and Affect: Mood normal.        Behavior: Behavior normal.      LABS: Recent Results  (from the past 2160 hour(s))  CBC w/Diff/Platelet     Status: None   Collection Time: 08/01/22 11:47 AM  Result Value Ref Range   WBC 3.4 3.4 - 10.8 x10E3/uL   RBC 4.76 3.77 - 5.28 x10E6/uL   Hemoglobin 13.3 11.1 - 15.9 g/dL   Hematocrit 40.8 34.0 - 46.6 %   MCV 86 79 - 97 fL   MCH 27.9 26.6 - 33.0 pg   MCHC 32.6 31.5 - 35.7 g/dL   RDW 13.0 11.7 - 15.4 %   Platelets 238 150 - 450 x10E3/uL   Neutrophils 65 Not Estab. %   Lymphs 20 Not Estab. %   Monocytes 13 Not Estab. %   Eos 1 Not Estab. %   Basos 1 Not  Estab. %   Neutrophils Absolute 2.2 1.4 - 7.0 x10E3/uL   Lymphocytes Absolute 0.7 0.7 - 3.1 x10E3/uL   Monocytes Absolute 0.4 0.1 - 0.9 x10E3/uL   EOS (ABSOLUTE) 0.0 0.0 - 0.4 x10E3/uL   Basophils Absolute 0.0 0.0 - 0.2 x10E3/uL   Immature Granulocytes 0 Not Estab. %   Immature Grans (Abs) 0.0 0.0 - 0.1 x10E3/uL  Comprehensive metabolic panel     Status: Abnormal   Collection Time: 08/01/22 11:47 AM  Result Value Ref Range   Glucose 112 (H) 70 - 99 mg/dL   BUN 12 8 - 27 mg/dL   Creatinine, Ser 0.74 0.57 - 1.00 mg/dL   eGFR 82 >59 mL/min/1.73   BUN/Creatinine Ratio 16 12 - 28   Sodium 140 134 - 144 mmol/L   Potassium 5.2 3.5 - 5.2 mmol/L   Chloride 98 96 - 106 mmol/L   CO2 26 20 - 29 mmol/L   Calcium 9.7 8.7 - 10.3 mg/dL   Total Protein 6.7 6.0 - 8.5 g/dL   Albumin 4.3 3.8 - 4.8 g/dL   Globulin, Total 2.4 1.5 - 4.5 g/dL   Albumin/Globulin Ratio 1.8 1.2 - 2.2   Bilirubin Total 0.5 0.0 - 1.2 mg/dL   Alkaline Phosphatase 90 44 - 121 IU/L   AST 20 0 - 40 IU/L   ALT 11 0 - 32 IU/L  TSH + free T4     Status: None   Collection Time: 08/01/22 11:47 AM  Result Value Ref Range   TSH 0.733 0.450 - 4.500 uIU/mL   Free T4 1.44 0.82 - 1.77 ng/dL  Lipid Panel With LDL/HDL Ratio     Status: None   Collection Time: 08/01/22 11:47 AM  Result Value Ref Range   Cholesterol, Total 168 100 - 199 mg/dL   Triglycerides 82 0 - 149 mg/dL   HDL 57 >39 mg/dL   VLDL Cholesterol Cal 15 5 -  40 mg/dL   LDL Chol Calc (NIH) 96 0 - 99 mg/dL   LDL/HDL Ratio 1.7 0.0 - 3.2 ratio    Comment:                                     LDL/HDL Ratio                                             Men  Women                               1/2 Avg.Risk  1.0    1.5                                   Avg.Risk  3.6    3.2                                2X Avg.Risk  6.2    5.0                                3X Avg.Risk  8.0    6.1  VITAMIN D 25 Hydroxy (Vit-D Deficiency, Fractures)     Status: None   Collection Time: 08/01/22 11:47 AM  Result Value Ref Range   Vit D, 25-Hydroxy 62.5 30.0 - 100.0 ng/mL    Comment: Vitamin D deficiency has been defined by the [place] practice guideline as a level of serum 25-OH vitamin D less than 20 ng/mL (1,2). The Endocrine Society went on to further define vitamin D insufficiency as a level between 21 and 29 ng/mL (2). 1. IOM (Institute of Medicine). 2010. Dietary reference    intakes for calcium and D. [place]: The    [employer]. 2. Holick MF, Binkley Joppa, Bischoff-Ferrari HA, et al.    Evaluation, treatment, and prevention of vitamin D    deficiency: an Endocrine Society clinical practice    guideline. JCEM. 2011 Jul; 96(7):1911-30.   B12 and Folate Panel     Status: None   Collection Time: 08/01/22 11:47 AM  Result Value Ref Range   Vitamin B-12 775 232 - 1,245 pg/mL   Folate 15.1 >3.0 ng/mL    Comment: A serum folate concentration of less than 3.1 ng/mL is considered to represent clinical deficiency.   Glucose, capillary     Status: None   Collection Time: 08/25/22 12:29 PM  Result Value Ref Range   Glucose-Capillary 90 70 - 99 mg/dL    Comment: Glucose reference range applies only to samples taken after fasting for at least 8 hours.        Assessment/Plan: 1. Encounter for general adult medical examination with abnormal findings CPE performed, labs reviewed  2. Obstructive chronic  bronchitis without exacerbation Followed by pulmonology  3. Flu vaccine need - Flu Vaccine MDCK QUAD PF     General Counseling: Prestyn verbalizes understanding of the findings of todays visit and agrees with plan of treatment. I have discussed any further diagnostic evaluation that may be needed or ordered today. We also reviewed her medications today. she has been encouraged to call the office with any questions or concerns that should arise related to todays visit.    Counseling:    Orders Placed This Encounter  Procedures   Flu Vaccine MDCK QUAD PF    No orders of the defined types were placed in this encounter.   This patient was seen by Drema Dallas, PA-C in collaboration with Dr. Clayborn Bigness as a part of collaborative care agreement.  Total time spent:35 Minutes  Time spent includes review of chart, medications, test results, and follow up plan with the patient.     Lavera Guise, MD  Internal Medicine

## 2022-09-18 ENCOUNTER — Other Ambulatory Visit: Payer: Self-pay | Admitting: Physician Assistant

## 2022-09-18 DIAGNOSIS — M810 Age-related osteoporosis without current pathological fracture: Secondary | ICD-10-CM

## 2022-09-26 ENCOUNTER — Other Ambulatory Visit: Payer: Self-pay | Admitting: Internal Medicine

## 2022-09-26 DIAGNOSIS — J4489 Other specified chronic obstructive pulmonary disease: Secondary | ICD-10-CM

## 2022-12-02 ENCOUNTER — Other Ambulatory Visit: Payer: Self-pay | Admitting: Physician Assistant

## 2022-12-02 DIAGNOSIS — A310 Pulmonary mycobacterial infection: Secondary | ICD-10-CM

## 2022-12-02 NOTE — Telephone Encounter (Signed)
Please review and send 

## 2022-12-08 ENCOUNTER — Other Ambulatory Visit: Payer: Self-pay

## 2022-12-08 ENCOUNTER — Telehealth: Payer: Self-pay

## 2022-12-08 NOTE — Telephone Encounter (Signed)
Faxed [employer]xygen order [phone]

## 2022-12-09 ENCOUNTER — Encounter: Payer: Self-pay | Admitting: Internal Medicine

## 2022-12-09 NOTE — Telephone Encounter (Signed)
Spoke with pt son and advised him she is on 2 litre oxygen

## 2022-12-12 ENCOUNTER — Telehealth: Payer: Self-pay

## 2022-12-12 ENCOUNTER — Other Ambulatory Visit: Payer: Self-pay | Admitting: Internal Medicine

## 2022-12-12 NOTE — Telephone Encounter (Signed)
Spoke with ashley from [employer] that please hold for oxygen re certification until [employer] talk to pt son he might change company and also LMOM due to they need Portable oxygen concentrator to pt son to call me back and also advised pt son if he need to change company need appt

## 2022-12-16 ENCOUNTER — Telehealth: Payer: Self-pay | Admitting: Physician Assistant

## 2022-12-16 NOTE — Telephone Encounter (Signed)
Received o2 order from [employer]. Gave to Lauren for signature along with most recent office notes and copy of insurance card-Toni

## 2022-12-27 ENCOUNTER — Encounter: Payer: Self-pay | Admitting: Internal Medicine

## 2022-12-29 ENCOUNTER — Encounter: Payer: Self-pay | Admitting: Internal Medicine

## 2022-12-29 ENCOUNTER — Ambulatory Visit (INDEPENDENT_AMBULATORY_CARE_PROVIDER_SITE_OTHER): Payer: Medicare Other | Admitting: Internal Medicine

## 2022-12-29 VITALS — BP 119/76 | HR 101 | Temp 97.8°F | Resp 16 | Ht 61.0 in | Wt 110.4 lb

## 2022-12-29 DIAGNOSIS — J4489 Other specified chronic obstructive pulmonary disease: Secondary | ICD-10-CM | POA: Diagnosis not present

## 2022-12-29 DIAGNOSIS — A310 Pulmonary mycobacterial infection: Secondary | ICD-10-CM

## 2022-12-29 DIAGNOSIS — J479 Bronchiectasis, uncomplicated: Secondary | ICD-10-CM

## 2022-12-29 DIAGNOSIS — J9611 Chronic respiratory failure with hypoxia: Secondary | ICD-10-CM | POA: Diagnosis not present

## 2022-12-29 MED ORDER — BREZTRI AEROSPHERE 160-9-4.8 MCG/ACT IN AERO: 2.0000 | INHALATION_SPRAY | Freq: Two times a day (BID) | RESPIRATORY_TRACT | 11 refills | Status: DC

## 2022-12-29 NOTE — Progress Notes (Signed)
[facility] [place], [place] [address]  Pulmonary Sleep Medicine   Office Visit Note  Patient Name: Breanna Christian DOB: [birth year] MRN [id number]  Date of Service: 12/29/2022  Complaints/HPI: She has had a bit of decline on her breathing. She currently is on tiotropin and albuterol. I dont see a maintenance inhalers at this time. She has cough very slight in early morning. Patient is on oxygen wants to see about switching to ionegin. No chest pain or palpitations  ROS  General: (-) fever, (-) chills, (-) night sweats, (-) weakness Skin: (-) rashes, (-) itching,. Eyes: (-) visual changes, (-) redness, (-) itching. Nose and Sinuses: (-) nasal stuffiness or itchiness, (-) postnasal drip, (-) nosebleeds, (-) sinus trouble. Mouth and Throat: (-) sore throat, (-) hoarseness. Neck: (-) swollen glands, (-) enlarged thyroid, (-) neck pain. Respiratory: + cough, (-) bloody sputum, + shortness of breath, - wheezing. Cardiovascular: - ankle swelling, (-) chest pain. Lymphatic: (-) lymph node enlargement. Neurologic: (-) numbness, (-) tingling. Psychiatric: (-) anxiety, (-) depression   Current Medication: Outpatient Encounter Medications as of 12/29/2022  Medication Sig   ARTIFICIAL TEAR OP Place 1 drop into both eyes every 6 (six) hours as needed (dry eyes). A product from [place].   azithromycin (ZITHROMAX) 250 MG tablet TAKE 1 TABLET EVERY DAY   clotrimazole-betamethasone (LOTRISONE) cream APPLY TO AFFECTED AREA TWICE A DAY   ethambutol (MYAMBUTOL) 400 MG tablet Take 1 tablet (400 mg total) by mouth daily.   ibandronate (BONIVA) 150 MG tablet TAKE 1 TAB EVERY 30 DAYS IN MORNING WITH FULL GLASS OF WATER ON EMPTY STOMACH. NO FOOD, DRINK, MEDS, LYING DOWN X1 HR   ibuprofen (ADVIL,MOTRIN) 200 MG tablet Take 200 mg by mouth as needed.   ipratropium-albuterol (DUONEB) 0.5-2.5 (3) MG/3ML SOLN Take 3 mLs by nebulization every 2 (two) hours as needed. DX: J44.9    Lifitegrast 5 % SOLN Apply 1 drop to eye in the morning and at bedtime. 1 Gtt both eyes twice a day   Multiple Vitamin (MULTI-VITAMINS) TABS Take 1 tablet by mouth daily. Reported on 04/25/2016   Omega-3 Fatty Acids (OMEGA-3 FISH OIL) 300 MG CAPS Take by mouth daily.   rifampin (RIFADIN) 150 MG capsule TAKE 1 CAPSULE EVERY DAY   sodium chloride HYPERTONIC 3 % nebulizer solution Take by nebulization as needed for other.   tiotropium (SPIRIVA HANDIHALER) 18 MCG inhalation capsule INHALE 1 CAPSULE VIA HANDIHALER ONCE DAILY AT THE SAME TIME EVERY DAY   VENTOLIN HFA 108 (90 Base) MCG/ACT inhaler TAKE 2 PUFFS BY MOUTH EVERY 6 HOURS AS NEEDED   No facility-administered encounter medications on file as of 12/29/2022.    Surgical History: Past Surgical History:  Procedure Laterality Date   APPENDECTOMY     BRONCHOSCOPY  02/2016   CATARACT EXTRACTION W/PHACO Right 10/26/2016   Procedure: CATARACT EXTRACTION PHACO AND INTRAOCULAR LENS PLACEMENT ([place]);  Surgeon: Estill Cotta, MD;  Location: [facility] ORS;  Service: Ophthalmology;  Laterality: Right;  [place] 2.17 AP% 26.5 CDE 56.86 Fluid pack lot # [id number]   FLEXIBLE BRONCHOSCOPY Bilateral 03/16/2016   Procedure: FLEXIBLE BRONCHOSCOPY;  Surgeon: Allyne Gee, MD;  Location: [facility] ORS;  Service: Pulmonary;  Laterality: Bilateral;   FLEXIBLE BRONCHOSCOPY N/A 03/03/2017   Procedure: FLEXIBLE BRONCHOSCOPY;  Surgeon: Allyne Gee, MD;  Location: [facility] ORS;  Service: Pulmonary;  Laterality: N/A;   TUBAL LIGATION      Medical History: Past Medical History:  Diagnosis Date   Bronchiectasis ([place]) 2011  COPD (chronic obstructive pulmonary disease) (HCC)    HOH (hard of hearing)    Hypertension    Lung nodules    Mycobacterium avium complex colonization    Non-Hodgkin lymphoma ([place]) 2003   Personal history of chemotherapy    Pneumonia    FREQUENT IN PAST    H/O MYCOBACTERIAL   Rheumatoid arthritis (HCC)    Sjogren's syndrome (HCC)    Xerostomia      Family History: Family History  Problem Relation Age of Onset   Cancer Sister    Breast cancer Neg Hx     Social History: Social History   Socioeconomic History   Marital status: Married    Spouse name: Not on file   Number of children: Not on file   Years of education: Not on file   Highest education level: Not on file  Occupational History   Not on file  Tobacco Use   Smoking status: Never   Smokeless tobacco: Never  Vaping Use   Vaping Use: Never used  Substance and Sexual Activity   Alcohol use: No   Drug use: No   Sexual activity: Not on file  Other Topics Concern   Not on file  Social History Narrative   Not on file   Social Determinants of Health   Financial Resource Strain: Not on file  Food Insecurity: Not on file  Transportation Needs: Not on file  Physical Activity: Not on file  Stress: Not on file  Social Connections: Not on file  Intimate Partner Violence: Not on file    Vital Signs: Blood pressure 119/76, pulse (!) 101, temperature 97.8 F (36.6 C), resp. rate 16, height 5' 1"$  (1.549 m), weight 110 lb 6.4 oz (50.1 kg), SpO2 (!) 75 %.  Examination: General Appearance: The patient is well-developed, well-nourished, and in no distress. Skin: Gross inspection of skin unremarkable. Head: normocephalic, no gross deformities. Eyes: no gross deformities noted. ENT: ears appear grossly normal no exudates. Neck: Supple. No thyromegaly. No LAD. Respiratory: few rhonchi. Cardiovascular: Normal S1 and S2 without murmur or rub. Extremities: No cyanosis. pulses are equal. Neurologic: Alert and oriented. No involuntary movements.  LABS: No results found for this or any previous visit (from the past 2160 hour(s)).  Radiology: NM PET Image Initial (PI) Skull Base To Thigh (F-18 FDG)  Result Date: 08/28/2022 CLINICAL DATA:  Subsequent  treatment strategy for pulmonary nodule. EXAM: NUCLEAR MEDICINE PET SKULL BASE TO THIGH TECHNIQUE: 6.51 mCi F-18 FDG  was injected intravenously. Full-ring PET imaging was performed from the skull base to thigh after the radiotracer. CT data was obtained and used for attenuation correction and anatomic localization. Fasting blood glucose: 9.0 mg/dl COMPARISON:  06/13/2022 FINDINGS: Mediastinal blood pool activity: SUV max 2.11 Liver activity: SUV max NA NECK: No hypermetabolic lymph nodes in the neck. Incidental CT findings: None. CHEST: Lung nodule within the anterior right upper lobe measures 1.3 cm and has an SUV max 3.71, image 83/2. On the previous exam this measured 1.2 cm within SUV max of 2.48. Continued complete opacification of the right middle lobe with internal areas of bronchiectasis. There is mild low level FDG uptake throughout this area with SUV max of 3.39, image 93/2. On the previous exam the SUV max within this area was equal to 4.28. Previously referenced sub solid lesion in the right lower lobe with SUV max of 1.28 is not seen on today's study. Nodular lesion within the lingula is again seen measuring 1 cm on today's study  with SUV max of 2.08. Previously this measured 1.3 cm with SUV max of 1.92. Mild tracer uptake is again noted associated with the mediastinal and hilar lymph nodes. This is a nonspecific finding in the setting of chronic inflammation/infection. For example, SUV max within the right hilum is equal to 2.65 on today's study. This is compared with 2.43 previously. SUV max within the left hilum is equal to 2.60 versus 1.67 previously. SUV max associated with the low right paratracheal lymph node is equal to 2.6 on today's study versus the 1.99 previously. Incidental CT findings: As noted on the previous exam there are extensive postinflammatory changes throughout both lungs including areas of bronchiectasis, bronchiolectasis, scarring and tree-in-bud nodularity. Which most likely reflect sequelae of chronic indolent atypical infection such as MAI. Aortic atherosclerosis and coronary artery  calcifications. ABDOMEN/PELVIS: No abnormal tracer uptake identified within the liver, pancreas, spleen, or adrenal glands. No tracer avid abdominopelvic lymph nodes. Incidental CT findings: Aortic atherosclerosis. Scattered liver cysts are again noted. Gallstones. Aortic atherosclerotic calcifications. SKELETON: No focal hypermetabolic activity to suggest skeletal metastasis. Incidental CT findings: None. IMPRESSION: 1. As mentioned on the previous PET-CT, this study is technically challenging due to extensive chronic postinflammatory/atypical infectious changes throughout both lungs. 2. Previously referenced indeterminate nodule within the anterior right upper lobe measures 1.3 cm within SUV max of 3.71. This is compared with 1.2 cm and SUV max of 2.48. Continued close interval follow-up of this nodule is advised. 3. Persistent complete consolidation of the right middle lobe with mild to moderate tracer uptake. The SUV max on today's study is equal to 3.39 versus 4.28 previously. In the setting of chronic, indolent atypical infection this is a nonspecific finding. 4. Similar nodular lesion within the lingula which exhibits mild tracer uptake similar to the previous exam. 5. Mild tracer uptake is again noted associated with mediastinal and hilar lymph nodes. This is a nonspecific finding in the setting of chronic indolent atypical infection such as MAI. 6. Gallstones. 7.  Aortic Atherosclerosis (ICD10-I70.0). Electronically Signed   By: Kerby Moors M.D.   On: 08/28/2022 14:50    No results found.  No results found.    Assessment and Plan: Patient Active Problem List   Diagnosis Date Noted   Encounter for general adult medical examination with abnormal findings 08/08/2020   Other fatigue 08/08/2020   Need for vaccination against Streptococcus pneumoniae using pneumococcal conjugate vaccine 7 08/08/2020   Age related osteoporosis 10/15/2018   Ovarian failure 10/15/2018   Screening for breast  cancer 04/29/2018   Essential hypertension 04/29/2018   Obstructive chronic bronchitis without exacerbation 04/29/2018   MAI (mycobacterium avium-intracellulare) ([place]) 02/08/2018   Numbness and tingling 03/25/2015   Myopathy 02/19/2015   Polyneuropathy 02/18/2015   Weakness of both lower extremities 02/18/2015    1. Obstructive chronic bronchitis without exacerbation I will give her samples of Breztri to see if this helps her with the breathing.  Of course once again this is a part of her disease process and natural decline in function as expected - Budeson-Glycopyrrol-Formoterol (BREZTRI AEROSPHERE) 160-9-4.8 MCG/ACT AERO; Inhale 2 puffs into the lungs 2 (two) times daily.  Dispense: 10.7 g; Refill: 11  2. Chronic respiratory failure with hypoxia (HCC) Oxygen therapy as has been discussed spoke with the son about this once again  3. Bronchiectasis without acute exacerbation ([place]) Secondary to chronic MAI infection patient will continue with current therapy and watch for any exacerbations  4. MAI (mycobacterium avium-intracellulare) infection ([place]) Continue with supportive  care and therapy watch for any flareups and exacerbations  General Counseling: I have discussed the findings of the evaluation and examination with Abbrielle.  I have also discussed any further diagnostic evaluation thatmay be needed or ordered today. Donnika verbalizes understanding of the findings of todays visit. We also reviewed her medications today and discussed drug interactions and side effects including but not limited excessive drowsiness and altered mental states. We also discussed that there is always a risk not just to her but also people around her. she has been encouraged to call the office with any questions or concerns that should arise related to todays visit.  No orders of the defined types were placed in this encounter.    Time spent: 44  I have personally obtained a history, examined the  patient, evaluated laboratory and imaging results, formulated the assessment and plan and placed orders.    Allyne Gee, MD [facility] Pulmonary and Critical Care Sleep medicine

## 2022-12-29 NOTE — Patient Instructions (Signed)
Chronic Obstructive Pulmonary Disease  Chronic obstructive pulmonary disease (COPD) is a long-term (chronic) lung problem. When you have COPD, it is hard for air to get in and out of your lungs. Usually the condition gets worse over time, and your lungs will never return to normal. There are things you can do to keep yourself as healthy as possible. What are the causes? Smoking. This is the most common cause. Certain genes passed from parent to child (inherited). What increases the risk? Being exposed to secondhand smoke from cigarettes, pipes, or cigars. Being exposed to chemicals and other irritants, such as fumes and dust in the work environment. Having chronic lung conditions or infections. What are the signs or symptoms? Shortness of breath, especially during physical activity. A long-term cough with a large amount of thick mucus. Sometimes, the cough may not have any mucus (dry cough). Wheezing. Breathing quickly. Skin that looks gray or blue, especially in the fingers, toes, or lips. Feeling tired (fatigue). Weight loss. Chest tightness. Having infections often. Episodes when breathing symptoms become much worse (exacerbations). At the later stages of this disease, you may have swelling in the ankles, feet, or legs. How is this treated? Taking medicines. Quitting smoking, if you smoke. Rehabilitation. This includes steps to make your body work better. It may involve a team of specialists. Doing exercises. Making changes to your diet. Using oxygen. Lung surgery. Lung transplant. Comfort measures (palliative care). Follow these instructions at home: Medicines Take over-the-counter and prescription medicines only as told by your doctor. Talk to your doctor before taking any cough or allergy medicines. You may need to avoid medicines that cause your lungs to be dry. Lifestyle If you smoke, stop smoking. Smoking makes the problem worse. Do not smoke or use any products that  contain nicotine or tobacco. If you need help quitting, ask your doctor. Avoid being around things that make your breathing worse. This may include smoke, chemicals, and fumes. Stay active, but remember to rest as well. Learn and use tips on how to manage stress and control your breathing. Make sure you get enough sleep. Most adults need at least 7 hours of sleep every night. Eat healthy foods. Eat smaller meals more often. Rest before meals. Controlled breathing Learn and use tips on how to control your breathing as told by your doctor. Try: Breathing in (inhaling) through your nose for 1 second. Then, pucker your lips and breath out (exhale) through your lips for 2 seconds. Putting one hand on your belly (abdomen). Breathe in slowly through your nose for 1 second. Your hand on your belly should move out. Pucker your lips and breathe out slowly through your lips. Your hand on your belly should move in as you breathe out.  Controlled coughing Learn and use controlled coughing to clear mucus from your lungs. Follow these steps: Lean your head a little forward. Breathe in deeply. Try to hold your breath for 3 seconds. Keep your mouth slightly open while coughing 2 times. Spit any mucus out into a tissue. Rest and do the steps again 1 or 2 times as needed. General instructions Make sure you get all the shots (vaccines) that your doctor recommends. Ask your doctor about a flu shot and a pneumonia shot. Use oxygen therapy and pulmonary rehabilitation if told by your doctor. If you need home oxygen therapy, ask your doctor if you should buy a tool to measure your oxygen level (oximeter). Make a COPD action plan with your doctor. This helps you   to know what to do if you feel worse than usual. Manage any other conditions you have as told by your doctor. Avoid going outside when it is very hot, cold, or humid. Avoid people who have a sickness you can catch (contagious). Keep all follow-up  visits. Contact a doctor if: You cough up more mucus than usual. There is a change in the color or thickness of the mucus. It is harder to breathe than usual. Your breathing is faster than usual. You have trouble sleeping. You need to use your medicines more often than usual. You have trouble doing your normal activities such as getting dressed or walking around the house. Get help right away if: You have shortness of breath while resting. You have shortness of breath that stops you from: Being able to talk. Doing normal activities. Your chest hurts for longer than 5 minutes. Your skin color is more blue than usual. Your pulse oximeter shows that you have low oxygen for longer than 5 minutes. You have a fever. You feel too tired to breathe normally. These symptoms may represent a serious problem that is an emergency. Do not wait to see if the symptoms will go away. Get medical help right away. Call your local emergency services (911 in the [place] Do not drive yourself to the hospital. Summary Chronic obstructive pulmonary disease (COPD) is a long-term lung problem. The way your lungs work will never return to normal. Usually the condition gets worse over time. There are things you can do to keep yourself as healthy as possible. Take over-the-counter and prescription medicines only as told by your doctor. If you smoke, stop. Smoking makes the problem worse. This information is not intended to replace advice given to you by your health care provider. Make sure you discuss any questions you have with your health care provider. Document Revised: 09/15/2020 Document Reviewed: 09/15/2020 Elsevier Patient Education  2023 [employer]  

## 2023-01-16 ENCOUNTER — Telehealth: Payer: Self-pay

## 2023-01-16 NOTE — Telephone Encounter (Signed)
Faxed again with note to stephanie to [phone]

## 2023-01-31 ENCOUNTER — Telehealth: Payer: Self-pay | Admitting: Internal Medicine

## 2023-01-31 NOTE — Telephone Encounter (Signed)
Corrected O2 order faxed back to [employer]; [phone]. Scanned-Toni

## 2023-02-01 ENCOUNTER — Telehealth: Payer: Self-pay | Admitting: Internal Medicine

## 2023-02-01 NOTE — Telephone Encounter (Signed)
Corrected provider name and NPI on [employer] order. Emailed back to [employer]

## 2023-02-06 ENCOUNTER — Ambulatory Visit: Payer: Medicare Other | Admitting: Internal Medicine

## 2023-02-09 ENCOUNTER — Encounter: Payer: Self-pay | Admitting: Internal Medicine

## 2023-02-09 ENCOUNTER — Ambulatory Visit (INDEPENDENT_AMBULATORY_CARE_PROVIDER_SITE_OTHER): Payer: Medicare Other | Admitting: Internal Medicine

## 2023-02-09 ENCOUNTER — Telehealth: Payer: Self-pay | Admitting: Internal Medicine

## 2023-02-09 VITALS — BP 136/90 | HR 105 | Temp 97.9°F | Resp 16 | Ht 61.0 in | Wt 109.0 lb

## 2023-02-09 DIAGNOSIS — J4489 Other specified chronic obstructive pulmonary disease: Secondary | ICD-10-CM | POA: Diagnosis not present

## 2023-02-09 DIAGNOSIS — R634 Abnormal weight loss: Secondary | ICD-10-CM

## 2023-02-09 DIAGNOSIS — R918 Other nonspecific abnormal finding of lung field: Secondary | ICD-10-CM

## 2023-02-09 DIAGNOSIS — J479 Bronchiectasis, uncomplicated: Secondary | ICD-10-CM | POA: Diagnosis not present

## 2023-02-09 DIAGNOSIS — A310 Pulmonary mycobacterial infection: Secondary | ICD-10-CM | POA: Diagnosis not present

## 2023-02-09 DIAGNOSIS — J9611 Chronic respiratory failure with hypoxia: Secondary | ICD-10-CM | POA: Diagnosis not present

## 2023-02-09 NOTE — Patient Instructions (Signed)
Pulmonary Fibrosis  Pulmonary fibrosis is a type of lung disease that causes scarring. Over time, the scar tissue builds up in the air sacs of your lungs (alveoli). This makes it hard for you to breathe because less oxygen gets into your bloodstream. Scarring from pulmonary fibrosis is permanent and may lead to other serious health problems. What are the causes? There are many different causes of pulmonary fibrosis. In some cases, the cause is not known. This is called idiopathic pulmonary fibrosis. Other causes include: Exposure to chemicals and substances found in agricultural, farm, construction, or factory work. These include mold, asbestos, silica, metal dusts, and toxic fumes. Sarcoidosis. In this disease, areas of inflammatory cells (granulomas) form and most often affect the lungs. Autoimmune diseases. These include diseases such as rheumatoid arthritis, systemic sclerosis, or connective tissue disease. Taking certain medicines. These include drugs used in radiation therapy or used to treat seizures, heart problems, and some infections. What increases the risk? You are more likely to develop this condition if: You have a family history of the disease. You are an older person. The condition is more common in older adults. You have a history of smoking. You have a job that exposes you to certain chemicals. You have gastroesophageal reflux disease (GERD). What are the signs or symptoms? Symptoms of this condition include: Difficulty breathing that gets worse with activity. Shortness of breath (dyspnea). Dry, hacking cough. Rapid, shallow breathing during exercise or while at rest. Other symptoms may include: Loss of appetite or weight loss Tiredness (fatigue) or weakness. Bluish skin and lips. Rounded and enlarged fingertips (clubbing). How is this diagnosed? This condition may be diagnosed based on: Your symptoms and medical history. A physical exam. You may also have tests,  including: A test that involves looking inside your lungs with an instrument (bronchoscopy). Imaging studies of your lungs and heart. Tests to measure how well you are breathing (pulmonary function tests). Blood tests. Tests to see how well your lungs work while you are walking (pulmonary stress test). A procedure to remove a lung tissue sample to look at it under a microscope (biopsy). How is this treated? There is no cure for pulmonary fibrosis. Treatment focuses on managing symptoms and preventing scarring from getting worse. This may include: Medicines, such as: Steroids to prevent permanent lung changes. Medicines to suppress your body's defense system (immune system). Medicines to help with lung function by reducing inflammation or scarring. Ongoing monitoring with X-rays and lab work. Oxygen therapy. Pulmonary rehabilitation. Surgery. In some cases, a lung transplant is possible. Follow these instructions at home:  Medicines Take over-the-counter and prescription medicines only as told by your health care provider. Keep your vaccinations up to date as recommended by your health care provider. Activity Get regular exercise, but do not pick activities that are too strenuous for you. Ask your health care provider what activities are safe for you. If you have physical limitations, you may get exercise by walking, using a stationary bike, or doing chair exercises. Ask your health care provider about using oxygen while exercising. Do breathing exercises as told by your health care provider. Plan rest periods when you get tired. General instructions Do not use any products that contain nicotine or tobacco. These products include cigarettes, chewing tobacco, and vaping devices, such as e-cigarettes. If you need help quitting, ask your health care provider. If you are exposed to chemicals and substances at work, make sure that you wear a mask or respirator at all times. Learn to   manage  stress. If you need help to do this, ask your health care provider. Join a pulmonary rehabilitation program or a support group for people with pulmonary fibrosis. Eat small meals often so you do not get too full. Overeating can make breathing trouble worse. Maintain a healthy weight. Lose weight if you need to. Keep all follow-up visits. This is important. Where to find more information American Lung Association: www.lung.org National Heart, Lung, and Blood Institute: www.nhlbi.nih.gov Pulmonary Fibrosis Foundation: pulmonaryfibrosis.org Contact a health care provider if: You have symptoms that do not get better with medicines. You are not able to be as active as usual. You have trouble taking a deep breath. You have a fever or chills. You have blue lips or skin. You have a lot of headaches. You cough up mucus that is dark in color. You have feelings of depression or sadness. You are unable to sleep because it is hard to breathe. Get help right away if: Your symptoms suddenly worsen. You have chest pain. You cough up blood. You get very confused or sleepy. These symptoms may be an emergency. Get help right away. Call 911. Do not wait to see if the symptoms will go away. Do not drive yourself to the hospital. Summary Pulmonary fibrosis is a type of lung disease that causes scar tissue to build up in the air sacs of your lungs (alveoli) over time. This makes it hard for you to breathe because less oxygen gets into your bloodstream. Scarring from pulmonary fibrosis is permanent and may lead to other serious health problems. You are more likely to develop this condition if you have a family history of the condition or a job that exposes you to certain chemicals. There is no cure for pulmonary fibrosis. Treatment focuses on managing symptoms and preventing scarring from getting worse. This information is not intended to replace advice given to you by your health care provider. Make sure  you discuss any questions you have with your health care provider. Document Revised: 06/29/2021 Document Reviewed: 06/29/2021 Elsevier Patient Education  2023 Elsevier Inc.  

## 2023-02-09 NOTE — Progress Notes (Signed)
[facility] [place], [place] [address]  Pulmonary Sleep Medicine   Office Visit Note  Patient Name: Breanna Christian DOB: 1941-01-13 MRN [id number]  Date of Service: 02/09/2023  Complaints/HPI: weight loss follow up nodule the patient apparently has not been eating very well she has been taking Ensure which appears to be killing her appetite.  The son was present in the room we had a very lengthy conversation regarding for food intake.  She is going to try to make a better effort as far as drinking the Ensure less and eating more frequently.  As far as the nodule is concerned we had noted that on the last PET scan the SUV was not significantly elevated.  We agreed to do a follow-up CT scan to follow-up on the nodule.  Son does appear to agree that there may not be many options even if this does turn out to be malignancy.  ROS  General: (-) fever, (-) chills, (-) night sweats, (-) weakness Skin: (-) rashes, (-) itching,. Eyes: (-) visual changes, (-) redness, (-) itching. Nose and Sinuses: (-) nasal stuffiness or itchiness, (-) postnasal drip, (-) nosebleeds, (-) sinus trouble. Mouth and Throat: (-) sore throat, (-) hoarseness. Neck: (-) swollen glands, (-) enlarged thyroid, (-) neck pain. Respiratory: - cough, (-) bloody sputum, - shortness of breath, - wheezing. Cardiovascular: - ankle swelling, (-) chest pain. Lymphatic: (-) lymph node enlargement. Neurologic: (-) numbness, (-) tingling. Psychiatric: (-) anxiety, (-) depression   Current Medication: Outpatient Encounter Medications as of 02/09/2023  Medication Sig   ARTIFICIAL TEAR OP Place 1 drop into both eyes every 6 (six) hours as needed (dry eyes). A product from [place].   azithromycin (ZITHROMAX) 250 MG tablet TAKE 1 TABLET EVERY DAY   Budeson-Glycopyrrol-Formoterol (BREZTRI AEROSPHERE) 160-9-4.8 MCG/ACT AERO Inhale 2 puffs into the lungs 2 (two) times daily.   clotrimazole-betamethasone  (LOTRISONE) cream APPLY TO AFFECTED AREA TWICE A DAY   ethambutol (MYAMBUTOL) 400 MG tablet Take 1 tablet (400 mg total) by mouth daily.   ibandronate (BONIVA) 150 MG tablet TAKE 1 TAB EVERY 30 DAYS IN MORNING WITH FULL GLASS OF WATER ON EMPTY STOMACH. NO FOOD, DRINK, MEDS, LYING DOWN X1 HR   ibuprofen (ADVIL,MOTRIN) 200 MG tablet Take 200 mg by mouth as needed.   ipratropium-albuterol (DUONEB) 0.5-2.5 (3) MG/3ML SOLN Take 3 mLs by nebulization every 2 (two) hours as needed. DX: J44.9   Lifitegrast 5 % SOLN Apply 1 drop to eye in the morning and at bedtime. 1 Gtt both eyes twice a day   Multiple Vitamin (MULTI-VITAMINS) TABS Take 1 tablet by mouth daily. Reported on 04/25/2016   Omega-3 Fatty Acids (OMEGA-3 FISH OIL) 300 MG CAPS Take by mouth daily.   rifampin (RIFADIN) 150 MG capsule TAKE 1 CAPSULE EVERY DAY   sodium chloride HYPERTONIC 3 % nebulizer solution Take by nebulization as needed for other.   VENTOLIN HFA 108 (90 Base) MCG/ACT inhaler TAKE 2 PUFFS BY MOUTH EVERY 6 HOURS AS NEEDED   No facility-administered encounter medications on file as of 02/09/2023.    Surgical History: Past Surgical History:  Procedure Laterality Date   APPENDECTOMY     BRONCHOSCOPY  02/2016   CATARACT EXTRACTION W/PHACO Right 10/26/2016   Procedure: CATARACT EXTRACTION PHACO AND INTRAOCULAR LENS PLACEMENT ([place]);  Surgeon: Estill Cotta, MD;  Location: [facility] ORS;  Service: Ophthalmology;  Laterality: Right;  [place] 2.17 AP% 26.5 CDE 56.86 Fluid pack lot # [id number]   FLEXIBLE BRONCHOSCOPY Bilateral  03/16/2016   Procedure: FLEXIBLE BRONCHOSCOPY;  Surgeon: Allyne Gee, MD;  Location: [facility] ORS;  Service: Pulmonary;  Laterality: Bilateral;   FLEXIBLE BRONCHOSCOPY N/A 03/03/2017   Procedure: FLEXIBLE BRONCHOSCOPY;  Surgeon: Allyne Gee, MD;  Location: [facility] ORS;  Service: Pulmonary;  Laterality: N/A;   TUBAL LIGATION      Medical History: Past Medical History:  Diagnosis Date   Bronchiectasis ([place]) 2011    COPD (chronic obstructive pulmonary disease) (HCC)    HOH (hard of hearing)    Hypertension    Lung nodules    Mycobacterium avium complex colonization    Non-Hodgkin lymphoma ([place]) 2003   Personal history of chemotherapy    Pneumonia    FREQUENT IN PAST    H/O MYCOBACTERIAL   Rheumatoid arthritis (HCC)    Sjogren's syndrome (HCC)    Xerostomia     Family History: Family History  Problem Relation Age of Onset   Cancer Sister    Breast cancer Neg Hx     Social History: Social History   Socioeconomic History   Marital status: Married    Spouse name: Not on file   Number of children: Not on file   Years of education: Not on file   Highest education level: Not on file  Occupational History   Not on file  Tobacco Use   Smoking status: Never   Smokeless tobacco: Never  Vaping Use   Vaping Use: Never used  Substance and Sexual Activity   Alcohol use: No   Drug use: No   Sexual activity: Not on file  Other Topics Concern   Not on file  Social History Narrative   Not on file   Social Determinants of Health   Financial Resource Strain: Not on file  Food Insecurity: Not on file  Transportation Needs: Not on file  Physical Activity: Not on file  Stress: Not on file  Social Connections: Not on file  Intimate Partner Violence: Not on file    Vital Signs: Blood pressure (!) 136/90, pulse (!) 105, temperature 97.9 F (36.6 C), resp. rate 16, height 5\' 1"  (1.549 m), weight 109 lb (49.4 kg), SpO2 94 %.  Examination: General Appearance: The patient is well-developed, well-nourished, and in no distress. Skin: Gross inspection of skin unremarkable. Head: normocephalic, no gross deformities. Eyes: no gross deformities noted. ENT: ears appear grossly normal no exudates. Neck: Supple. No thyromegaly. No LAD. Respiratory: no rhonchi noted. Cardiovascular: Normal S1 and S2 without murmur or rub. Extremities: No cyanosis. pulses are equal. Neurologic: Alert and oriented.  No involuntary movements.  LABS: No results found for this or any previous visit (from the past 2160 hour(s)).  Radiology: NM PET Image Initial (PI) Skull Base To Thigh (F-18 FDG)  Result Date: 08/28/2022 CLINICAL DATA:  Subsequent  treatment strategy for pulmonary nodule. EXAM: NUCLEAR MEDICINE PET SKULL BASE TO THIGH TECHNIQUE: 6.51 mCi F-18 FDG was injected intravenously. Full-ring PET imaging was performed from the skull base to thigh after the radiotracer. CT data was obtained and used for attenuation correction and anatomic localization. Fasting blood glucose: 9.0 mg/dl COMPARISON:  06/13/2022 FINDINGS: Mediastinal blood pool activity: SUV max 2.11 Liver activity: SUV max NA NECK: No hypermetabolic lymph nodes in the neck. Incidental CT findings: None. CHEST: Lung nodule within the anterior right upper lobe measures 1.3 cm and has an SUV max 3.71, image 83/2. On the previous exam this measured 1.2 cm within SUV max of 2.48. Continued complete opacification of the right middle  lobe with internal areas of bronchiectasis. There is mild low level FDG uptake throughout this area with SUV max of 3.39, image 93/2. On the previous exam the SUV max within this area was equal to 4.28. Previously referenced sub solid lesion in the right lower lobe with SUV max of 1.28 is not seen on today's study. Nodular lesion within the lingula is again seen measuring 1 cm on today's study with SUV max of 2.08. Previously this measured 1.3 cm with SUV max of 1.92. Mild tracer uptake is again noted associated with the mediastinal and hilar lymph nodes. This is a nonspecific finding in the setting of chronic inflammation/infection. For example, SUV max within the right hilum is equal to 2.65 on today's study. This is compared with 2.43 previously. SUV max within the left hilum is equal to 2.60 versus 1.67 previously. SUV max associated with the low right paratracheal lymph node is equal to 2.6 on today's study versus the 1.99  previously. Incidental CT findings: As noted on the previous exam there are extensive postinflammatory changes throughout both lungs including areas of bronchiectasis, bronchiolectasis, scarring and tree-in-bud nodularity. Which most likely reflect sequelae of chronic indolent atypical infection such as MAI. Aortic atherosclerosis and coronary artery calcifications. ABDOMEN/PELVIS: No abnormal tracer uptake identified within the liver, pancreas, spleen, or adrenal glands. No tracer avid abdominopelvic lymph nodes. Incidental CT findings: Aortic atherosclerosis. Scattered liver cysts are again noted. Gallstones. Aortic atherosclerotic calcifications. SKELETON: No focal hypermetabolic activity to suggest skeletal metastasis. Incidental CT findings: None. IMPRESSION: 1. As mentioned on the previous PET-CT, this study is technically challenging due to extensive chronic postinflammatory/atypical infectious changes throughout both lungs. 2. Previously referenced indeterminate nodule within the anterior right upper lobe measures 1.3 cm within SUV max of 3.71. This is compared with 1.2 cm and SUV max of 2.48. Continued close interval follow-up of this nodule is advised. 3. Persistent complete consolidation of the right middle lobe with mild to moderate tracer uptake. The SUV max on today's study is equal to 3.39 versus 4.28 previously. In the setting of chronic, indolent atypical infection this is a nonspecific finding. 4. Similar nodular lesion within the lingula which exhibits mild tracer uptake similar to the previous exam. 5. Mild tracer uptake is again noted associated with mediastinal and hilar lymph nodes. This is a nonspecific finding in the setting of chronic indolent atypical infection such as MAI. 6. Gallstones. 7.  Aortic Atherosclerosis (ICD10-I70.0). Electronically Signed   By: Kerby Moors M.D.   On: 08/28/2022 14:50    No results found.  No results found.    Assessment and Plan: Patient Active  Problem List   Diagnosis Date Noted   Encounter for general adult medical examination with abnormal findings 08/08/2020   Other fatigue 08/08/2020   Need for vaccination against Streptococcus pneumoniae using pneumococcal conjugate vaccine 7 08/08/2020   Age related osteoporosis 10/15/2018   Ovarian failure 10/15/2018   Screening for breast cancer 04/29/2018   Essential hypertension 04/29/2018   Obstructive chronic bronchitis without exacerbation 04/29/2018   MAI (mycobacterium avium-intracellulare) ([place]) 02/08/2018   Numbness and tingling 03/25/2015   Myopathy 02/19/2015   Polyneuropathy 02/18/2015   Weakness of both lower extremities 02/18/2015    1. Obstructive chronic bronchitis without exacerbation Will continue with current regimen oxygen therapy  2. Chronic respiratory failure with hypoxia (HCC) She needs to use her oxygen as prescribed will continue with supportive care  3. Bronchiectasis without acute exacerbation (HCC) No acute exacerbation of bronchiectasis overall her  underlying pulmonary disease is also probably contributing to in part pulmonary cachexia  4. MAI (mycobacterium avium-intracellulare) infection ([place]) Supportive care medical management will continue to monitor  5. Pulmonary nodules Will get a follow-up CT with scan to reassess the nodules that were observed - CT Chest High Resolution; Future  6. Weight loss observed on examination We discussed diet management center.  Patient may need dietary counseling if she continues to lose weight  General Counseling: I have discussed the findings of the evaluation and examination with Meriel.  I have also discussed any further diagnostic evaluation thatmay be needed or ordered today. Bular verbalizes understanding of the findings of todays visit. We also reviewed her medications today and discussed drug interactions and side effects including but not limited excessive drowsiness and altered mental states. We  also discussed that there is always a risk not just to her but also people around her. she has been encouraged to call the office with any questions or concerns that should arise related to todays visit.  Orders Placed This Encounter  Procedures   CT Chest High Resolution    Standing Status:   Future    Standing Expiration Date:   02/09/2024    Order Specific Question:   Preferred imaging location?    Answer:   [facility]     Time spent: 50  I have personally obtained a history, examined the patient, evaluated laboratory and imaging results, formulated the assessment and plan and placed orders.    Allyne Gee, MD [facility] Pulmonary and Critical Care Sleep medicine

## 2023-02-09 NOTE — Telephone Encounter (Signed)
Lvm w/ son to return call regarding CT appointment and to schedule follow up-Toni

## 2023-02-18 ENCOUNTER — Encounter: Payer: Self-pay | Admitting: Internal Medicine

## 2023-02-20 ENCOUNTER — Telehealth: Payer: Self-pay

## 2023-02-20 MED ORDER — ALPRAZOLAM 0.25 MG PO TABS: ORAL_TABLET | ORAL | 0 refills | Status: DC

## 2023-02-20 NOTE — Telephone Encounter (Signed)
Patient called requesting medication to help with nerves for CT procedure tomorrow. Per DSK, called Xanax 0.25mg  into pharmacy. Patient will take medication 1 hour before procedure, given an extra tablet if needed.

## 2023-02-20 NOTE — Telephone Encounter (Signed)
Left message for patient's son to please call back regarding medication prescription pick-up.

## 2023-02-21 ENCOUNTER — Ambulatory Visit
Admission: RE | Admit: 2023-02-21 | Discharge: 2023-02-21 | Disposition: A | Discharge status: Home / Self Care | Payer: Medicare Other | Source: Ambulatory Visit | Attending: Internal Medicine | Admitting: Internal Medicine

## 2023-02-21 DIAGNOSIS — J90 Pleural effusion, not elsewhere classified: Secondary | ICD-10-CM | POA: Diagnosis not present

## 2023-02-21 DIAGNOSIS — J479 Bronchiectasis, uncomplicated: Secondary | ICD-10-CM | POA: Diagnosis not present

## 2023-02-21 DIAGNOSIS — R918 Other nonspecific abnormal finding of lung field: Secondary | ICD-10-CM

## 2023-02-22 ENCOUNTER — Telehealth: Payer: Self-pay

## 2023-02-22 NOTE — Telephone Encounter (Signed)
[facility]  radiology called for abnormal Ct  result message send to [employer] for review

## 2023-02-23 ENCOUNTER — Telehealth: Payer: Self-pay

## 2023-02-23 NOTE — Telephone Encounter (Signed)
Spoke with pt son about CT and make her appt with DSK on Monday to discuss CT

## 2023-02-27 ENCOUNTER — Encounter: Payer: Self-pay | Admitting: Internal Medicine

## 2023-02-27 ENCOUNTER — Ambulatory Visit (INDEPENDENT_AMBULATORY_CARE_PROVIDER_SITE_OTHER): Payer: Medicare Other | Admitting: Internal Medicine

## 2023-02-27 VITALS — BP 129/67 | HR 97 | Temp 98.2°F | Resp 16 | Ht 61.0 in | Wt 111.0 lb

## 2023-02-27 DIAGNOSIS — A310 Pulmonary mycobacterial infection: Secondary | ICD-10-CM | POA: Diagnosis not present

## 2023-02-27 DIAGNOSIS — J9611 Chronic respiratory failure with hypoxia: Secondary | ICD-10-CM | POA: Diagnosis not present

## 2023-02-27 DIAGNOSIS — J90 Pleural effusion, not elsewhere classified: Secondary | ICD-10-CM

## 2023-02-27 DIAGNOSIS — J479 Bronchiectasis, uncomplicated: Secondary | ICD-10-CM

## 2023-02-27 DIAGNOSIS — R634 Abnormal weight loss: Secondary | ICD-10-CM | POA: Diagnosis not present

## 2023-02-27 NOTE — Patient Instructions (Signed)
Mycobacterium Avium Complex Mycobacterium avium complex (MAC) is an infection caused by two similar and very common types of bacteria. MAC causes two types of infection: Local infection. This is limited to one area. If you have a normal disease-fighting system (immune system), you are more likely to get this type of infection. Disseminated infection. This is an infection that affects multiple parts of the body. It is more serious than a local infection. You are more likely to get this infection if you have a weak immune system. MAC infections most often affect the lungs. MAC is not contagious. This means that it does not spread from person to person. What are the causes? This condition is caused by two kinds of bacteria, Mycobacterium avium and Mycobacterium intracellulare. These bacteria are often found in drinking water, hot tubs, swimming pools, house dust, and animals. You may become infected from: Breathing in water particles or dust particles that contain the bacteria (are contaminated). Eating or drinking something that is contaminated. Drinking milk. MAC bacteria can grow in pasteurized milk. What increases the risk? The following factors may make you more likely to develop this condition: Having HIV or AIDS. Being a child. Being an older woman. Smoking cigarettes. Having long-term (chronic) lung diseases, such as: Tuberculosis. Chronic obstructive pulmonary disease (COPD). Lung cancer. Cystic fibrosis. What are the signs or symptoms? Symptoms vary depending on the type of MAC infection you have. Symptoms of a local infection Lymph nodes that are bigger than usual (enlarged). Enlarged lymph nodes may be: On one side of the neck. Under the jaw. Around the ear. Feeling tired (fatigue). Shortness of breath. A chronic cough that produces lots of mucus. Abnormal sounds when breathing. A health care provider may hear this when listening to your lungs. Sores (ulcers) or lumps  (abscesses) on the skin. Symptoms of a disseminated infection Fever. Night sweats. Weight loss. Loss of appetite. Fatigue. Diarrhea. Abdominal pain. How is this diagnosed? This condition may be diagnosed based on: Your symptoms. A physical exam. Your medical history. You may also have tests, including: Chest X-ray or CT scan. Blood tests. Test of mucus from your lungs (sputum) or other body fluids to see whether bacteria will grow (culture). Removal of a piece of body tissue to be checked under a microscope (biopsy). How is this treated? Treatment for this condition depends on the type of infection. Treatment may include: Taking antibiotic medicines. You may have to take antibiotics until MAC bacteria have stopped growing in cultures. Surgery to remove infected lymph nodes. After the lymph nodes are removed, antibiotics are usually not needed. In some people with a disseminated infection, treatment with antibiotics may continue for several years. Follow these instructions at home: Medicines  Take over-the-counter and prescription medicines only as told by your health care provider. Take your antibiotic medicine as told by your health care provider. Do not stop taking the antibiotic even if you start to feel better. Eating and drinking  Eat a healthy, well-balanced diet. Talk with your health care provider or a dietitian about what food choices are best for you. Drink enough fluid to keep your urine pale yellow. General instructions Seek medical care for any underlying health conditions you may have. Follow your health care provider's instructions about how to manage those conditions. Do not use any products that contain nicotine or tobacco. These products include cigarettes, chewing tobacco, and vaping devices, such as e-cigarettes. If you need help quitting, ask your health care provider. Keep all follow-up visits. This is  important. How is this prevented?  Stay up to date on  your vaccines. Take all of your medicines as told by your health care provider, especially if you have problems with your immune system. Avoid drinking water that may not be clean. Lakes, rivers, and other open water sources can contain germs that cause infections. Wash your hands often with soap and water. If soap and water are not available, use hand sanitizer. Practice safe food preparation, especially if your immune system is weak because of an underlying illness. Store foods at safe temperatures. Avoid eating raw or undercooked meats, poultry, fish, shellfish, and eggs. Wash and peel fruits and vegetables before eating or cooking them. Use separate food preparation surfaces and storage spaces for raw meat and for fruits and vegetables. Wash your hands, food preparation surfaces, and utensils thoroughly before and after you handle raw foods. Contact a health care provider if you: Have chills or a fever. Have a cough that does not go away. Have loss of appetite or weight loss. Feel very tired. Get help right away if you: Have a fever for more than 2-3 days. Cough up blood. Have trouble breathing. Have chest pain. These symptoms may represent a serious problem that is an emergency. Do not wait to see if the symptoms will go away. Get medical help right away. Call your local emergency services (911 in the [place] Do not drive yourself to the hospital. Summary Mycobacterium avium complex (MAC) is an infection that is caused by two similar and very common types of bacteria. If you have a normal immune system, you are more likely to get a local infection. If you have a weak immune system, you are more likely to get a disseminated infection. Treatment depends on the type of infection and may include antibiotic medicines or surgery to remove infected lymph nodes. This information is not intended to replace advice given to you by your health care provider. Make sure you discuss any questions you  have with your health care provider. Document Revised: 02/11/2021 Document Reviewed: 02/11/2021 Elsevier Patient Education  2023 [employer].

## 2023-02-27 NOTE — Progress Notes (Signed)
[facility] [address] [place], [place] [address]  Pulmonary Sleep Medicine   Office Visit Note  Patient Name: Breanna Christian DOB: November 08, 1941 MRN [id number]  Date of Service: 02/27/2023  Complaints/HPI: Follow up CT scan.  Patient had a CT scan done I reviewed the results of the CT scan in detail with patient as well as with the family.  There is finding of new right lower lobe consolidation and upper lobe consolidation which was concerning for multifocal pneumonia.  This also could represent atypical infection that she does have.  In any case radiology recommended repeating the CT scan in 3 months to look for resolution.  Patient also did have very chronic appearance with bronchiectasis and groundglass opacities.  There was also some concern for fluid and I explained to them about what we could possibly do to get diagnostic testing and removal of the fluid that may or may not be present.  This would have to be done through ultrasound guidance.   ROS  General: (-) fever, (-) chills, (-) night sweats, (-) weakness Skin: (-) rashes, (-) itching,. Eyes: (-) visual changes, (-) redness, (-) itching. Nose and Sinuses: (-) nasal stuffiness or itchiness, (-) postnasal drip, (-) nosebleeds, (-) sinus trouble. Mouth and Throat: (-) sore throat, (-) hoarseness. Neck: (-) swollen glands, (-) enlarged thyroid, (-) neck pain. Respiratory: - cough, (-) bloody sputum, - shortness of breath, - wheezing. Cardiovascular: - ankle swelling, (-) chest pain. Lymphatic: (-) lymph node enlargement. Neurologic: (-) numbness, (-) tingling. Psychiatric: (-) anxiety, (-) depression   Current Medication: Outpatient Encounter Medications as of 02/27/2023  Medication Sig   ALPRAZolam (XANAX) 0.25 MG tablet Take a 1/2 tablet 1 hour prior to procedure. If no improvement in symptoms, patient can take another 1/2 tablet.   ARTIFICIAL TEAR OP Place 1 drop into both eyes every 6 (six) hours as needed  (dry eyes). A product from [place].   azithromycin (ZITHROMAX) 250 MG tablet TAKE 1 TABLET EVERY DAY   Budeson-Glycopyrrol-Formoterol (BREZTRI AEROSPHERE) 160-9-4.8 MCG/ACT AERO Inhale 2 puffs into the lungs 2 (two) times daily.   clotrimazole-betamethasone (LOTRISONE) cream APPLY TO AFFECTED AREA TWICE A DAY   ethambutol (MYAMBUTOL) 400 MG tablet Take 1 tablet (400 mg total) by mouth daily.   ibandronate (BONIVA) 150 MG tablet TAKE 1 TAB EVERY 30 DAYS IN MORNING WITH FULL GLASS OF WATER ON EMPTY STOMACH. NO FOOD, DRINK, MEDS, LYING DOWN X1 HR   ibuprofen (ADVIL,MOTRIN) 200 MG tablet Take 200 mg by mouth as needed.   ipratropium-albuterol (DUONEB) 0.5-2.5 (3) MG/3ML SOLN Take 3 mLs by nebulization every 2 (two) hours as needed. DX: J44.9   Lifitegrast 5 % SOLN Apply 1 drop to eye in the morning and at bedtime. 1 Gtt both eyes twice a day   Multiple Vitamin (MULTI-VITAMINS) TABS Take 1 tablet by mouth daily. Reported on 04/25/2016   Omega-3 Fatty Acids (OMEGA-3 FISH OIL) 300 MG CAPS Take by mouth daily.   rifampin (RIFADIN) 150 MG capsule TAKE 1 CAPSULE EVERY DAY   sodium chloride HYPERTONIC 3 % nebulizer solution Take by nebulization as needed for other.   VENTOLIN HFA 108 (90 Base) MCG/ACT inhaler TAKE 2 PUFFS BY MOUTH EVERY 6 HOURS AS NEEDED   No facility-administered encounter medications on file as of 02/27/2023.    Surgical History: Past Surgical History:  Procedure Laterality Date   APPENDECTOMY     BRONCHOSCOPY  02/2016   CATARACT EXTRACTION W/PHACO Right 10/26/2016   Procedure: CATARACT EXTRACTION  PHACO AND INTRAOCULAR LENS PLACEMENT (IOC);  Surgeon: Sallee Lange, MD;  Location: [facility] ORS;  Service: Ophthalmology;  Laterality: Right;  [place] 2.17 AP% 26.5 CDE 56.86 Fluid pack lot # [id number]   FLEXIBLE BRONCHOSCOPY Bilateral 03/16/2016   Procedure: FLEXIBLE BRONCHOSCOPY;  Surgeon: Yevonne Pax, MD;  Location: [facility] ORS;  Service: Pulmonary;  Laterality: Bilateral;   FLEXIBLE  BRONCHOSCOPY N/A 03/03/2017   Procedure: FLEXIBLE BRONCHOSCOPY;  Surgeon: Yevonne Pax, MD;  Location: [facility] ORS;  Service: Pulmonary;  Laterality: N/A;   TUBAL LIGATION      Medical History: Past Medical History:  Diagnosis Date   Bronchiectasis 2011   COPD (chronic obstructive pulmonary disease)    HOH (hard of hearing)    Hypertension    Lung nodules    Mycobacterium avium complex colonization    Non-Hodgkin lymphoma 2003   Personal history of chemotherapy    Pneumonia    FREQUENT IN PAST    H/O MYCOBACTERIAL   Rheumatoid arthritis    Sjogren's syndrome    Xerostomia     Family History: Family History  Problem Relation Age of Onset   Cancer Sister    Breast cancer Neg Hx     Social History: Social History   Socioeconomic History   Marital status: Married    Spouse name: Not on file   Number of children: Not on file   Years of education: Not on file   Highest education level: Not on file  Occupational History   Not on file  Tobacco Use   Smoking status: Never   Smokeless tobacco: Never  Vaping Use   Vaping Use: Never used  Substance and Sexual Activity   Alcohol use: No   Drug use: No   Sexual activity: Not on file  Other Topics Concern   Not on file  Social History Narrative   Not on file   Social Determinants of Health   Financial Resource Strain: Not on file  Food Insecurity: Not on file  Transportation Needs: Not on file  Physical Activity: Not on file  Stress: Not on file  Social Connections: Not on file  Intimate Partner Violence: Not on file    Vital Signs: Resp. rate 16, height 5\' 1"  (1.549 m), weight 111 lb (50.3 kg).  Examination: General Appearance: The patient is well-developed, well-nourished, and in no distress. Skin: Gross inspection of skin unremarkable. Head: normocephalic, no gross deformities. Eyes: no gross deformities noted. ENT: ears appear grossly normal no exudates. Neck: Supple. No thyromegaly. No LAD. Respiratory:  no rhonchi noted. Cardiovascular: Normal S1 and S2 without murmur or rub. Extremities: No cyanosis. pulses are equal. Neurologic: Alert and oriented. No involuntary movements.  LABS: No results found for this or any previous visit (from the past 2160 hour(s)).  Radiology: CT Chest High Resolution  Result Date: 02/21/2023 CLINICAL DATA:  Interstitial lung disease EXAM: CT CHEST WITHOUT CONTRAST TECHNIQUE: Multidetector CT imaging of the chest was performed following the standard protocol without intravenous contrast. High resolution imaging of the lungs, as well as inspiratory and expiratory imaging, was performed. RADIATION DOSE REDUCTION: This exam was performed according to the departmental dose-optimization program which includes automated exposure control, adjustment of the mA and/or kV according to patient size and/or use of iterative reconstruction technique. COMPARISON:  High-resolution chest CT dated June 03, 2022 FINDINGS: Cardiovascular: Normal heart size. Trace pericardial effusion. Aortic valve calcifications. Normal caliber thoracic aorta with moderate atherosclerotic disease. Mediastinum/Nodes: Esophagus and thyroid are unremarkable. No enlarged lymph nodes  seen in the chest. Lungs/Pleura: Bilateral bronchiectasis and bronchial wall thickening with scattered areas of mucous plugging. Chronic right middle lobe atelectasis with associated bronchiectasis. New right lower lobe and right upper lobe consolidations. Additional scattered solid pulmonary nodules are unchanged when compared with prior exam reference solid pulmonary nodule of the right upper lobe measuring 10 x 7 mm on series 10, image 121, unchanged/ innumerable small centrilobular ground-glass and solid pulmonary nodules, similar to prior exam. New small right pleural effusion. Upper Abdomen: Scattered low-attenuation liver lesions unchanged when compared with prior exam and likely due to simple cysts. Partially visualized simple  appearing pelvic parapelvic cysts of the left kidney, unchanged when compared with the prior exam, no specific follow-up imaging is recommended. Musculoskeletal: No chest wall mass or suspicious bone lesions identified. IMPRESSION: 1. New right lower lobe and right upper lobe consolidations, findings are concerning for multifocal pneumonia, possibly due to worsening of known chronic atypical infection or superimposed acute process. Recommend follow-up chest CT in 3 months to ensure resolution. 2. New small right pleural effusion. 3. Bilateral bronchiectasis with innumerable ground-glass and solid pulmonary nodules, similar to prior exam, and consistent with chronic non tuberculous mycobacterial infection. 4. Stable chronic right middle lobe atelectasis. 5. Aortic Atherosclerosis (ICD10-I70.0). These results will be called to the ordering clinician or representative by the Radiologist Assistant, and communication documented in the PACS or [employer]. Electronically Signed   By: Allegra Lai M.D.   On: 02/21/2023 21:14    No results found.  CT Chest High Resolution  Result Date: 02/21/2023 CLINICAL DATA:  Interstitial lung disease EXAM: CT CHEST WITHOUT CONTRAST TECHNIQUE: Multidetector CT imaging of the chest was performed following the standard protocol without intravenous contrast. High resolution imaging of the lungs, as well as inspiratory and expiratory imaging, was performed. RADIATION DOSE REDUCTION: This exam was performed according to the departmental dose-optimization program which includes automated exposure control, adjustment of the mA and/or kV according to patient size and/or use of iterative reconstruction technique. COMPARISON:  High-resolution chest CT dated June 03, 2022 FINDINGS: Cardiovascular: Normal heart size. Trace pericardial effusion. Aortic valve calcifications. Normal caliber thoracic aorta with moderate atherosclerotic disease. Mediastinum/Nodes: Esophagus and thyroid are  unremarkable. No enlarged lymph nodes seen in the chest. Lungs/Pleura: Bilateral bronchiectasis and bronchial wall thickening with scattered areas of mucous plugging. Chronic right middle lobe atelectasis with associated bronchiectasis. New right lower lobe and right upper lobe consolidations. Additional scattered solid pulmonary nodules are unchanged when compared with prior exam reference solid pulmonary nodule of the right upper lobe measuring 10 x 7 mm on series 10, image 121, unchanged/ innumerable small centrilobular ground-glass and solid pulmonary nodules, similar to prior exam. New small right pleural effusion. Upper Abdomen: Scattered low-attenuation liver lesions unchanged when compared with prior exam and likely due to simple cysts. Partially visualized simple appearing pelvic parapelvic cysts of the left kidney, unchanged when compared with the prior exam, no specific follow-up imaging is recommended. Musculoskeletal: No chest wall mass or suspicious bone lesions identified. IMPRESSION: 1. New right lower lobe and right upper lobe consolidations, findings are concerning for multifocal pneumonia, possibly due to worsening of known chronic atypical infection or superimposed acute process. Recommend follow-up chest CT in 3 months to ensure resolution. 2. New small right pleural effusion. 3. Bilateral bronchiectasis with innumerable ground-glass and solid pulmonary nodules, similar to prior exam, and consistent with chronic non tuberculous mycobacterial infection. 4. Stable chronic right middle lobe atelectasis. 5. Aortic Atherosclerosis (ICD10-I70.0). These results will  be called to the ordering clinician or representative by the Radiologist Assistant, and communication documented in the PACS or [employer]. Electronically Signed   By: Allegra Lai M.D.   On: 02/21/2023 21:14    Assessment and Plan: Patient Active Problem List   Diagnosis Date Noted   Encounter for general adult medical  examination with abnormal findings 08/08/2020   Other fatigue 08/08/2020   Need for vaccination against Streptococcus pneumoniae using pneumococcal conjugate vaccine 7 08/08/2020   Age related osteoporosis 10/15/2018   Ovarian failure 10/15/2018   Screening for breast cancer 04/29/2018   Essential hypertension 04/29/2018   Obstructive chronic bronchitis without exacerbation 04/29/2018   MAI (mycobacterium avium-intracellulare) 02/08/2018   Numbness and tingling 03/25/2015   Myopathy 02/19/2015   Polyneuropathy 02/18/2015   Weakness of both lower extremities 02/18/2015   1. Pleural effusion due to another disorder She may have some pleural fluid based on the findings of the CT scan and so therefore we will try to get her scheduled for radiology to see if they can do a thoracentesis. - IR THORACENTESIS ASP PLEURAL SPACE W/IMG GUIDE; Future  2. MAI (mycobacterium avium-intracellulare) infection She needs to continue with current recommended therapy.  She has had some weight loss concurrent which is likely related to the fact that she has advanced severe pulmonary disease and could be pulmonary cachexia developing in addition to that she does not have much of an appetite  3. Weight loss observed on examination As discussed not much of an appetite noted.  Patient has been encouraged to be eating meals rather than just relying on nutritional supplements which could potentially kill her appetite  4. Chronic respiratory failure with hypoxia On oxygen therapy this needs to be continued  5. Bronchiectasis without acute exacerbation Advanced severe chronic bronchiectasis from recurrence of infections   General Counseling: I have discussed the findings of the evaluation and examination with Breanna Christian.  I have also discussed any further diagnostic evaluation thatmay be needed or ordered today. Breanna Christian verbalizes understanding of the findings of todays visit. We also reviewed her medications  today and discussed drug interactions and side effects including but not limited excessive drowsiness and altered mental states. We also discussed that there is always a risk not just to her but also people around her. she has been encouraged to call the office with any questions or concerns that should arise related to todays visit.  Orders Placed This Encounter  Procedures   IR THORACENTESIS ASP PLEURAL SPACE W/IMG GUIDE    Standing Status:   Future    Standing Expiration Date:   02/27/2024    Order Specific Question:   Are labs required for specimen collection?    Answer:   Yes    Order Specific Question:   Lab orders requested (DO NOT place separate lab orders, these will be automatically ordered during procedure specimen collection):    Answer:   Acid Fast Culture With Reflexed Sensitivities    Order Specific Question:   Lab orders requested (DO NOT place separate lab orders, these will be automatically ordered during procedure specimen collection):    Answer:   Acid Fast Smear (Afb)    Order Specific Question:   Lab orders requested (DO NOT place separate lab orders, these will be automatically ordered during procedure specimen collection):    Answer:   Body Fluid Cell Count With Differential    Order Specific Question:   Lab orders requested (DO NOT place separate lab orders, these will  be automatically ordered during procedure specimen collection):    Answer:   Body Fluid Culture    Order Specific Question:   Lab orders requested (DO NOT place separate lab orders, these will be automatically ordered during procedure specimen collection):    Answer:   Fungus Culture With Stain    Order Specific Question:   Lab orders requested (DO NOT place separate lab orders, these will be automatically ordered during procedure specimen collection):    Answer:   Glucose, Body Fluid    Order Specific Question:   Lab orders requested (DO NOT place separate lab orders, these will be automatically ordered  during procedure specimen collection):    Answer:   Lactate Dehydrogenase, Body Fluid    Order Specific Question:   Lab orders requested (DO NOT place separate lab orders, these will be automatically ordered during procedure specimen collection):    Answer:   Ph, Body Fluid    Order Specific Question:   Lab orders requested (DO NOT place separate lab orders, these will be automatically ordered during procedure specimen collection):    Answer:   Protein, Pleural Or Peritoneal Fluid    Order Specific Question:   Lab orders requested (DO NOT place separate lab orders, these will be automatically ordered during procedure specimen collection):    Answer:   Cytology - Non Pap    Order Specific Question:   Reason for Exam (SYMPTOM  OR DIAGNOSIS REQUIRED)    Answer:   Pleural effusion    Order Specific Question:   Preferred Imaging Location?    Answer:   [facility]     Time spent: 76  I have personally obtained a history, examined the patient, evaluated laboratory and imaging results, formulated the assessment and plan and placed orders.    Yevonne Pax, MD [facility] Pulmonary and Critical Care Sleep medicine

## 2023-02-28 ENCOUNTER — Other Ambulatory Visit: Payer: Self-pay | Admitting: Student

## 2023-02-28 ENCOUNTER — Ambulatory Visit
Admission: RE | Admit: 2023-02-28 | Discharge: 2023-02-28 | Disposition: A | Discharge status: Home / Self Care | Payer: Medicare Other | Source: Ambulatory Visit | Attending: Internal Medicine | Admitting: Internal Medicine

## 2023-02-28 ENCOUNTER — Other Ambulatory Visit: Payer: Self-pay | Admitting: Internal Medicine

## 2023-02-28 DIAGNOSIS — Z9889 Other specified postprocedural states: Secondary | ICD-10-CM

## 2023-02-28 DIAGNOSIS — J90 Pleural effusion, not elsewhere classified: Secondary | ICD-10-CM

## 2023-02-28 DIAGNOSIS — J9611 Chronic respiratory failure with hypoxia: Secondary | ICD-10-CM

## 2023-02-28 DIAGNOSIS — R634 Abnormal weight loss: Secondary | ICD-10-CM

## 2023-02-28 DIAGNOSIS — J479 Bronchiectasis, uncomplicated: Secondary | ICD-10-CM

## 2023-02-28 DIAGNOSIS — A310 Pulmonary mycobacterial infection: Secondary | ICD-10-CM

## 2023-02-28 NOTE — Procedures (Signed)
Patient presented to [facility] [place] for scheduled thoracentesis. Upon examination with ultrasound probe, only trace pleural fluid on right lung was identified. No safely accessible fluid pocket was identified. No thoracentesis was attempted today. Order changed to limited chest [place]. Kennieth Francois, PA-C 02/28/2023 11:32 AM

## 2023-03-01 ENCOUNTER — Telehealth: Payer: Self-pay

## 2023-03-01 NOTE — Telephone Encounter (Signed)
Pt son advised that we gave d/c order to [employer] to [facility]  for oxygen

## 2023-03-13 ENCOUNTER — Ambulatory Visit (INDEPENDENT_AMBULATORY_CARE_PROVIDER_SITE_OTHER): Payer: Medicare Other | Admitting: Physician Assistant

## 2023-03-13 ENCOUNTER — Encounter: Payer: Self-pay | Admitting: Physician Assistant

## 2023-03-13 VITALS — BP 116/62 | HR 116 | Temp 98.0°F | Resp 16 | Ht 61.0 in | Wt 110.8 lb

## 2023-03-13 DIAGNOSIS — A310 Pulmonary mycobacterial infection: Secondary | ICD-10-CM | POA: Diagnosis not present

## 2023-03-13 DIAGNOSIS — J90 Pleural effusion, not elsewhere classified: Secondary | ICD-10-CM

## 2023-03-13 DIAGNOSIS — J9611 Chronic respiratory failure with hypoxia: Secondary | ICD-10-CM | POA: Diagnosis not present

## 2023-03-13 DIAGNOSIS — K219 Gastro-esophageal reflux disease without esophagitis: Secondary | ICD-10-CM

## 2023-03-13 MED ORDER — OMEPRAZOLE 20 MG PO CPDR: 20.0000 mg | DELAYED_RELEASE_CAPSULE | Freq: Every day | ORAL | 3 refills | Status: DC

## 2023-03-13 NOTE — Progress Notes (Signed)
[facility] [address] [place], [place] [address]  Internal MEDICINE  Office Visit Note  Patient Name: Breanna Christian  [id number]  [id number]  Date of Service: 03/13/2023  Chief Complaint  Patient presents with   Follow-up    Patient needs handicap sign for car, will need form filled out.   Hypertension    HPI Pt is here for routine follow up -Oxygen 2L full time now -Not coughing much, but is still SOB when she is up doing anything -Reports that she went to hospital for [place] with thoracentesis for pleural effusion previously seen, however trace fluid was seen on [place] and no thoracentesis was performed. -Followed by pulmonary, has visit on Monday to discuss further -Had been losing weight, has been stable for a few weeks now. Trying to increase intake with high calorie supplement -Pt reports she has some some discomfort  under left side of ribs intermittently, has been going on a little while. Was already occurring prior to last CT chest which did not show anything on left side of chest in area of concern. Upon further questioning she does also have reflux and some gas which may explain this. Discussed starting low dose PPI to help, may need to increase dose. She will also discuss with pulm on Monday -Parking placard signed  Current Medication: Outpatient Encounter Medications as of 03/13/2023  Medication Sig   ALPRAZolam (XANAX) 0.25 MG tablet Take a 1/2 tablet 1 hour prior to procedure. If no improvement in symptoms, patient can take another 1/2 tablet.   ARTIFICIAL TEAR OP Place 1 drop into both eyes every 6 (six) hours as needed (dry eyes). A product from [place].   azithromycin (ZITHROMAX) 250 MG tablet TAKE 1 TABLET EVERY DAY   Budeson-Glycopyrrol-Formoterol (BREZTRI AEROSPHERE) 160-9-4.8 MCG/ACT AERO Inhale 2 puffs into the lungs 2 (two) times daily.   clotrimazole-betamethasone (LOTRISONE) cream APPLY TO AFFECTED AREA TWICE A DAY   ethambutol (MYAMBUTOL) 400 MG  tablet Take 1 tablet (400 mg total) by mouth daily.   ibandronate (BONIVA) 150 MG tablet TAKE 1 TAB EVERY 30 DAYS IN MORNING WITH FULL GLASS OF WATER ON EMPTY STOMACH. NO FOOD, DRINK, MEDS, LYING DOWN X1 HR   ibuprofen (ADVIL,MOTRIN) 200 MG tablet Take 200 mg by mouth as needed.   ipratropium-albuterol (DUONEB) 0.5-2.5 (3) MG/3ML SOLN Take 3 mLs by nebulization every 2 (two) hours as needed. DX: J44.9   Lifitegrast 5 % SOLN Apply 1 drop to eye in the morning and at bedtime. 1 Gtt both eyes twice a day   Multiple Vitamin (MULTI-VITAMINS) TABS Take 1 tablet by mouth daily. Reported on 04/25/2016   Omega-3 Fatty Acids (OMEGA-3 FISH OIL) 300 MG CAPS Take by mouth daily.   omeprazole (PRILOSEC) 20 MG capsule Take 1 capsule (20 mg total) by mouth daily.   rifampin (RIFADIN) 150 MG capsule TAKE 1 CAPSULE EVERY DAY   sodium chloride HYPERTONIC 3 % nebulizer solution Take by nebulization as needed for other.   VENTOLIN HFA 108 (90 Base) MCG/ACT inhaler TAKE 2 PUFFS BY MOUTH EVERY 6 HOURS AS NEEDED   No facility-administered encounter medications on file as of 03/13/2023.    Surgical History: Past Surgical History:  Procedure Laterality Date   APPENDECTOMY     BRONCHOSCOPY  02/2016   CATARACT EXTRACTION W/PHACO Right 10/26/2016   Procedure: CATARACT EXTRACTION PHACO AND INTRAOCULAR LENS PLACEMENT (IOC);  Surgeon: Sallee Lange, MD;  Location: [facility] OR[facility];  Service: Ophthalmology;  Laterality: Right;  [place] 2.17 AP%  26.5 CDE 56.86 Fluid pack lot # [id number]   FLEXIBLE BRONCHOSCOPY Bilateral 03/16/2016   Procedure: FLEXIBLE BRONCHOSCOPY;  Surgeon: Yevonne Pax, MD;  Location: [facility];  Service: Pulmonary;  Laterality: Bilateral;   FLEXIBLE BRONCHOSCOPY N/A 03/03/2017   Procedure: FLEXIBLE BRONCHOSCOPY;  Surgeon: Yevonne Pax, MD;  Location: [facility] OR[facility];  Service: Pulmonary;  Laterality: N/A;   TUBAL LIGATION      Medical History: Past Medical History:  Diagnosis Date   Bronchiectasis 2011   COPD  (chronic obstructive pulmonary disease)    HOH (hard of hearing)    Hypertension    Lung nodules    Mycobacterium avium complex colonization    Non-Hodgkin lymphoma 2003   Personal history of chemotherapy    Pneumonia    FREQUENT IN PAST    H/O MYCOBACTERIAL   Rheumatoid arthritis    Sjogren's syndrome    Xerostomia     Family History: Family History  Problem Relation Age of Onset   Cancer Sister    Breast cancer Neg Hx     Social History   Socioeconomic History   Marital status: Married    Spouse name: Not on file   Number of children: Not on file   Years of education: Not on file   Highest education level: Not on file  Occupational History   Not on file  Tobacco Use   Smoking status: Never   Smokeless tobacco: Never  Vaping Use   Vaping Use: Never used  Substance and Sexual Activity   Alcohol use: No   Drug use: No   Sexual activity: Not on file  Other Topics Concern   Not on file  Social History Narrative   Not on file   Social Determinants of Health   Financial Resource Strain: Not on file  Food Insecurity: Not on file  Transportation Needs: Not on file  Physical Activity: Not on file  Stress: Not on file  Social Connections: Not on file  Intimate Partner Violence: Not on file      Review of Systems  Constitutional:  Negative for chills, fatigue and unexpected weight change.  HENT:  Negative for congestion, postnasal drip, rhinorrhea, sneezing and sore throat.   Eyes:  Negative for redness.  Respiratory:  Positive for shortness of breath. Negative for chest tightness.   Cardiovascular:  Negative for chest pain and palpitations.  Gastrointestinal:  Positive for abdominal pain. Negative for constipation, diarrhea, nausea and vomiting.  Genitourinary:  Negative for dysuria and frequency.  Musculoskeletal:  Negative for arthralgias, back pain, joint swelling and neck pain.  Skin:  Negative for rash.  Neurological: Negative.  Negative for tremors and  numbness.  Hematological:  Negative for adenopathy. Does not bruise/bleed easily.  Psychiatric/Behavioral:  Negative for behavioral problems (Depression), sleep disturbance and suicidal ideas. The patient is not nervous/anxious.     Vital Signs: BP 116/62   Pulse (!) 116   Temp 98 F (36.7 C)   Resp 16   Ht 5\' 1"  (1.549 m)   Wt 110 lb 12.8 oz (50.3 kg)   SpO2 92%   BMI 20.94 kg/m    Physical Exam Vitals and nursing note reviewed.  Constitutional:      Appearance: Normal appearance. She is normal weight.  HENT:     Head: Normocephalic and atraumatic.     Nose: Nose normal.     Mouth/Throat:     Mouth: Mucous membranes are moist.     Pharynx: No posterior oropharyngeal erythema.  Eyes:     Extraocular Movements: Extraocular movements intact.     Pupils: Pupils are equal, round, and reactive to light.  Cardiovascular:     Rate and Rhythm: Normal rate and regular rhythm.     Pulses: Normal pulses.     Heart sounds: Normal heart sounds.  Pulmonary:     Effort: Pulmonary effort is normal. No respiratory distress.     Breath sounds: No wheezing or rales.  Abdominal:     General: Abdomen is flat.     Palpations: Abdomen is soft.     Tenderness: There is no abdominal tenderness.  Musculoskeletal:        General: Normal range of motion.  Skin:    General: Skin is warm and dry.  Neurological:     General: No focal deficit present.     Mental Status: She is alert.  Psychiatric:        Mood and Affect: Mood normal.        Behavior: Behavior normal.        Assessment/Plan: 1. Chronic respiratory failure with hypoxia Continue oxygen, followed by pulm  2. Pleural effusion due to another disorder Follow up with pulm as scheduled  3. MAI (mycobacterium avium-intracellulare) infection Continue current medications, followed by pulm  4. Gastroesophageal reflux disease, unspecified whether esophagitis present Will trial omeprazole, may need to increase dose -  omeprazole (PRILOSEC) 20 MG capsule; Take 1 capsule (20 mg total) by mouth daily.  Dispense: 90 capsule; Refill: 3   General Counseling: Breanna Christian verbalizes understanding of the findings of todays visit and agrees with plan of treatment. I have discussed any further diagnostic evaluation that may be needed or ordered today. We also reviewed her medications today. she has been encouraged to call the office with any questions or concerns that should arise related to todays visit.    No orders of the defined types were placed in this encounter.   Meds ordered this encounter  Medications   omeprazole (PRILOSEC) 20 MG capsule    Sig: Take 1 capsule (20 mg total) by mouth daily.    Dispense:  90 capsule    Refill:  3    This patient was seen by Lynn Ito, PA-C in collaboration with Dr. Beverely Risen as a part of collaborative care agreement.   Total time spent:30 Minutes Time spent includes review of chart, medications, test results, and follow up plan with the patient.      Dr Lyndon Code Internal medicine

## 2023-03-20 ENCOUNTER — Ambulatory Visit (INDEPENDENT_AMBULATORY_CARE_PROVIDER_SITE_OTHER): Payer: Medicare Other | Admitting: Internal Medicine

## 2023-03-20 ENCOUNTER — Encounter: Payer: Self-pay | Admitting: Internal Medicine

## 2023-03-20 VITALS — BP 109/71 | HR 104 | Temp 97.8°F | Resp 16 | Ht 61.0 in | Wt 110.4 lb

## 2023-03-20 DIAGNOSIS — J4489 Other specified chronic obstructive pulmonary disease: Secondary | ICD-10-CM | POA: Diagnosis not present

## 2023-03-20 DIAGNOSIS — J9611 Chronic respiratory failure with hypoxia: Secondary | ICD-10-CM | POA: Diagnosis not present

## 2023-03-20 DIAGNOSIS — R634 Abnormal weight loss: Secondary | ICD-10-CM

## 2023-03-20 DIAGNOSIS — A310 Pulmonary mycobacterial infection: Secondary | ICD-10-CM

## 2023-03-20 NOTE — Progress Notes (Signed)
[facility] [address] [place], [place] [address]  Pulmonary Sleep Medicine   Office Visit Note  Patient Name: Breanna Christian DOB: [birth year] MRN [id number]  Date of Service: 03/20/2023  Complaints/HPI: SOB with exertion on oxygen. CT results and follow up after attmpted thoracentesis which did not succeed in seeing any fluid so therefore thoracentesis was not done.  Patient does appear to be at her baseline she has had a steady decline overall although but is still stable  Office Spirometry Results:     ROS  General: (-) fever, (-) chills, (-) night sweats, (-) weakness Skin: (-) rashes, (-) itching,. Eyes: (-) visual changes, (-) redness, (-) itching. Nose and Sinuses: (-) nasal stuffiness or itchiness, (-) postnasal drip, (-) nosebleeds, (-) sinus trouble. Mouth and Throat: (-) sore throat, (-) hoarseness. Neck: (-) swollen glands, (-) enlarged thyroid, (-) neck pain. Respiratory: + cough, (-) bloody sputum, + shortness of breath, + wheezing. Cardiovascular: - ankle swelling, (-) chest pain. Lymphatic: (-) lymph node enlargement. Neurologic: (-) numbness, (-) tingling. Psychiatric: (-) anxiety, (-) depression   Current Medication: Outpatient Encounter Medications as of 03/20/2023  Medication Sig   ALPRAZolam (XANAX) 0.25 MG tablet Take a 1/2 tablet 1 hour prior to procedure. If no improvement in symptoms, patient can take another 1/2 tablet.   ARTIFICIAL TEAR OP Place 1 drop into both eyes every 6 (six) hours as needed (dry eyes). A product from [place].   azithromycin (ZITHROMAX) 250 MG tablet TAKE 1 TABLET EVERY DAY   Budeson-Glycopyrrol-Formoterol (BREZTRI AEROSPHERE) 160-9-4.8 MCG/ACT AERO Inhale 2 puffs into the lungs 2 (two) times daily.   clotrimazole-betamethasone (LOTRISONE) cream APPLY TO AFFECTED AREA TWICE A DAY   ethambutol (MYAMBUTOL) 400 MG tablet Take 1 tablet (400 mg total) by mouth daily.   ibandronate (BONIVA) 150 MG tablet TAKE 1 TAB  EVERY 30 DAYS IN MORNING WITH FULL GLASS OF WATER ON EMPTY STOMACH. NO FOOD, DRINK, MEDS, LYING DOWN X1 HR   ibuprofen (ADVIL,MOTRIN) 200 MG tablet Take 200 mg by mouth as needed.   ipratropium-albuterol (DUONEB) 0.5-2.5 (3) MG/3ML SOLN Take 3 mLs by nebulization every 2 (two) hours as needed. DX: J44.9   Lifitegrast 5 % SOLN Apply 1 drop to eye in the morning and at bedtime. 1 Gtt both eyes twice a day   Multiple Vitamin (MULTI-VITAMINS) TABS Take 1 tablet by mouth daily. Reported on 04/25/2016   Omega-3 Fatty Acids (OMEGA-3 FISH OIL) 300 MG CAPS Take by mouth daily.   omeprazole (PRILOSEC) 20 MG capsule Take 1 capsule (20 mg total) by mouth daily.   rifampin (RIFADIN) 150 MG capsule TAKE 1 CAPSULE EVERY DAY   sodium chloride HYPERTONIC 3 % nebulizer solution Take by nebulization as needed for other.   VENTOLIN HFA 108 (90 Base) MCG/ACT inhaler TAKE 2 PUFFS BY MOUTH EVERY 6 HOURS AS NEEDED   No facility-administered encounter medications on file as of 03/20/2023.    Surgical History: Past Surgical History:  Procedure Laterality Date   APPENDECTOMY     BRONCHOSCOPY  02/2016   CATARACT EXTRACTION W/PHACO Right 10/26/2016   Procedure: CATARACT EXTRACTION PHACO AND INTRAOCULAR LENS PLACEMENT (IOC);  Surgeon: Sallee Lange, MD;  Location: [facility];  Service: Ophthalmology;  Laterality: Right;  [place] 2.17 AP% 26.5 CDE 56.86 Fluid pack lot # [id number]   FLEXIBLE BRONCHOSCOPY Bilateral 03/16/2016   Procedure: FLEXIBLE BRONCHOSCOPY;  Surgeon: Yevonne Pax, MD;  Location: [facility] OR[facility];  Service: Pulmonary;  Laterality: Bilateral;   FLEXIBLE BRONCHOSCOPY N/A  03/03/2017   Procedure: FLEXIBLE BRONCHOSCOPY;  Surgeon: Yevonne Pax, MD;  Location: [facility] ORS;  Service: Pulmonary;  Laterality: N/A;   TUBAL LIGATION      Medical History: Past Medical History:  Diagnosis Date   Bronchiectasis (HCC) 2011   COPD (chronic obstructive pulmonary disease) (HCC)    HOH (hard of hearing)    Hypertension    Lung  nodules    Mycobacterium avium complex colonization    Non-Hodgkin lymphoma (HCC) 2003   Personal history of chemotherapy    Pneumonia    FREQUENT IN PAST    H/O MYCOBACTERIAL   Rheumatoid arthritis (HCC)    Sjogren's syndrome (HCC)    Xerostomia     Family History: Family History  Problem Relation Age of Onset   Cancer Sister    Breast cancer Neg Hx     Social History: Social History   Socioeconomic History   Marital status: Married    Spouse name: Not on file   Number of children: Not on file   Years of education: Not on file   Highest education level: Not on file  Occupational History   Not on file  Tobacco Use   Smoking status: Never   Smokeless tobacco: Never  Vaping Use   Vaping Use: Never used  Substance and Sexual Activity   Alcohol use: No   Drug use: No   Sexual activity: Not on file  Other Topics Concern   Not on file  Social History Narrative   Not on file   Social Determinants of Health   Financial Resource Strain: Not on file  Food Insecurity: Not on file  Transportation Needs: Not on file  Physical Activity: Not on file  Stress: Not on file  Social Connections: Not on file  Intimate Partner Violence: Not on file    Vital Signs: Blood pressure 109/71, pulse (!) 104, temperature 97.8 F (36.6 C), resp. rate 16, height 5\' 1"  (1.549 m), weight 110 lb 6.4 oz (50.1 kg), SpO2 98 %.  Examination: General Appearance: The patient is well-developed, well-nourished, and in no distress. Skin: Gross inspection of skin unremarkable. Head: normocephalic, no gross deformities. Eyes: no gross deformities noted. ENT: ears appear grossly normal no exudates. Neck: Supple. No thyromegaly. No LAD. Respiratory: distant BS bilaterally. Cardiovascular: Normal S1 and S2 without murmur or rub. Extremities: No cyanosis. pulses are equal. Neurologic: Alert and oriented. No involuntary movements.  LABS: No results found for this or any previous visit (from the  past 2160 hour(s)).  Radiology: [place] CHEST (PLEURAL EFFUSION)  Result Date: 02/28/2023 CLINICAL DATA:  Pleural effusion, assess for thoracentesis EXAM: CHEST ULTRASOUND COMPARISON:  02/21/2023 CT chest FINDINGS: Trace right pleural effusion. No visualized left effusion. There is not enough pleural fluid to warrant thoracentesis. Procedure not. IMPRESSION: Trace right pleural effusion by ultrasound. Electronically Signed   By: Judie Petit.  Shick M.D.   On: 02/28/2023 11:51    No results found.  [place] CHEST (PLEURAL EFFUSION)  Result Date: 02/28/2023 CLINICAL DATA:  Pleural effusion, assess for thoracentesis EXAM: CHEST ULTRASOUND COMPARISON:  02/21/2023 CT chest FINDINGS: Trace right pleural effusion. No visualized left effusion. There is not enough pleural fluid to warrant thoracentesis. Procedure not. IMPRESSION: Trace right pleural effusion by ultrasound. Electronically Signed   By: Judie Petit.  Shick M.D.   On: 02/28/2023 11:51   CT Chest High Resolution  Result Date: 02/21/2023 CLINICAL DATA:  Interstitial lung disease EXAM: CT CHEST WITHOUT CONTRAST TECHNIQUE: Multidetector CT imaging of the chest was  performed following the standard protocol without intravenous contrast. High resolution imaging of the lungs, as well as inspiratory and expiratory imaging, was performed. RADIATION DOSE REDUCTION: This exam was performed according to the departmental dose-optimization program which includes automated exposure control, adjustment of the mA and/or kV according to patient size and/or use of iterative reconstruction technique. COMPARISON:  High-resolution chest CT dated June 03, 2022 FINDINGS: Cardiovascular: Normal heart size. Trace pericardial effusion. Aortic valve calcifications. Normal caliber thoracic aorta with moderate atherosclerotic disease. Mediastinum/Nodes: Esophagus and thyroid are unremarkable. No enlarged lymph nodes seen in the chest. Lungs/Pleura: Bilateral bronchiectasis and bronchial wall thickening with  scattered areas of mucous plugging. Chronic right middle lobe atelectasis with associated bronchiectasis. New right lower lobe and right upper lobe consolidations. Additional scattered solid pulmonary nodules are unchanged when compared with prior exam reference solid pulmonary nodule of the right upper lobe measuring 10 x 7 mm on series 10, image 121, unchanged/ innumerable small centrilobular ground-glass and solid pulmonary nodules, similar to prior exam. New small right pleural effusion. Upper Abdomen: Scattered low-attenuation liver lesions unchanged when compared with prior exam and likely due to simple cysts. Partially visualized simple appearing pelvic parapelvic cysts of the left kidney, unchanged when compared with the prior exam, no specific follow-up imaging is recommended. Musculoskeletal: No chest wall mass or suspicious bone lesions identified. IMPRESSION: 1. New right lower lobe and right upper lobe consolidations, findings are concerning for multifocal pneumonia, possibly due to worsening of known chronic atypical infection or superimposed acute process. Recommend follow-up chest CT in 3 months to ensure resolution. 2. New small right pleural effusion. 3. Bilateral bronchiectasis with innumerable ground-glass and solid pulmonary nodules, similar to prior exam, and consistent with chronic non tuberculous mycobacterial infection. 4. Stable chronic right middle lobe atelectasis. 5. Aortic Atherosclerosis (ICD10-I70.0). These results will be called to the ordering clinician or representative by the Radiologist Assistant, and communication documented in the PACS or [employer]. Electronically Signed   By: Allegra Lai M.D.   On: 02/21/2023 21:14    Assessment and Plan: Patient Active Problem List   Diagnosis Date Noted   Encounter for general adult medical examination with abnormal findings 08/08/2020   Other fatigue 08/08/2020   Need for vaccination against Streptococcus pneumoniae using  pneumococcal conjugate vaccine 7 08/08/2020   Age related osteoporosis 10/15/2018   Ovarian failure 10/15/2018   Screening for breast cancer 04/29/2018   Essential hypertension 04/29/2018   Obstructive chronic bronchitis without exacerbation 04/29/2018   MAI (mycobacterium avium-intracellulare) (HCC) 02/08/2018   Numbness and tingling 03/25/2015   Myopathy 02/19/2015   Polyneuropathy 02/18/2015   Weakness of both lower extremities 02/18/2015    1. Chronic respiratory failure with hypoxia (HCC) On oxygen may need to increase with exertion and I have instructed the son on how to do this  2. MAI (mycobacterium avium-intracellulare) infection (HCC) On treatment will continue current regimen she has been stable  3. Weight loss observed on examination Consider remeron for weight loss  4. Obstructive chronic bronchitis without exacerbation Continue with current treatment regimen with the inhaler regimen that has been prescribed  General Counseling: I have discussed the findings of the evaluation and examination with Romesha.  I have also discussed any further diagnostic evaluation thatmay be needed or ordered today. Pedro verbalizes understanding of the findings of todays visit. We also reviewed her medications today and discussed drug interactions and side effects including but not limited excessive drowsiness and altered mental states. We also discussed that there  is always a risk not just to her but also people around her. she has been encouraged to call the office with any questions or concerns that should arise related to todays visit.  No orders of the defined types were placed in this encounter.    Time spent: 41  I have personally obtained a history, examined the patient, evaluated laboratory and imaging results, formulated the assessment and plan and placed orders.    Yevonne Pax, MD [facility] Pulmonary and Critical Care Sleep medicine

## 2023-03-20 NOTE — Patient Instructions (Signed)
Chronic Obstructive Pulmonary Disease  Chronic obstructive pulmonary disease (COPD) is a long-term (chronic) lung problem. When you have COPD, it is hard for air to get in and out of your lungs. Usually the condition gets worse over time, and your lungs will never return to normal. There are things you can do to keep yourself as healthy as possible. What are the causes? Smoking. This is the most common cause. Certain genes passed from parent to child (inherited). What increases the risk? Being exposed to secondhand smoke from cigarettes, pipes, or cigars. Being exposed to chemicals and other irritants, such as fumes and dust in the work environment. Having chronic lung conditions or infections. What are the signs or symptoms? Shortness of breath, especially during physical activity. A long-term cough with a large amount of thick mucus. Sometimes, the cough may not have any mucus (dry cough). Wheezing. Breathing quickly. Skin that looks gray or blue, especially in the fingers, toes, or lips. Feeling tired (fatigue). Weight loss. Chest tightness. Having infections often. Episodes when breathing symptoms become much worse (exacerbations). At the later stages of this disease, you may have swelling in the ankles, feet, or legs. How is this treated? Taking medicines. Quitting smoking, if you smoke. Rehabilitation. This includes steps to make your body work better. It may involve a team of specialists. Doing exercises. Making changes to your diet. Using oxygen. Lung surgery. Lung transplant. Comfort measures (palliative care). Follow these instructions at home: Medicines Take over-the-counter and prescription medicines only as told by your doctor. Talk to your doctor before taking any cough or allergy medicines. You may need to avoid medicines that cause your lungs to be dry. Lifestyle If you smoke, stop smoking. Smoking makes the problem worse. Do not smoke or use any products that  contain nicotine or tobacco. If you need help quitting, ask your doctor. Avoid being around things that make your breathing worse. This may include smoke, chemicals, and fumes. Stay active, but remember to rest as well. Learn and use tips on how to manage stress and control your breathing. Make sure you get enough sleep. Most adults need at least 7 hours of sleep every night. Eat healthy foods. Eat smaller meals more often. Rest before meals. Controlled breathing Learn and use tips on how to control your breathing as told by your doctor. Try: Breathing in (inhaling) through your nose for 1 second. Then, pucker your lips and breath out (exhale) through your lips for 2 seconds. Putting one hand on your belly (abdomen). Breathe in slowly through your nose for 1 second. Your hand on your belly should move out. Pucker your lips and breathe out slowly through your lips. Your hand on your belly should move in as you breathe out.  Controlled coughing Learn and use controlled coughing to clear mucus from your lungs. Follow these steps: Lean your head a little forward. Breathe in deeply. Try to hold your breath for 3 seconds. Keep your mouth slightly open while coughing 2 times. Spit any mucus out into a tissue. Rest and do the steps again 1 or 2 times as needed. General instructions Make sure you get all the shots (vaccines) that your doctor recommends. Ask your doctor about a flu shot and a pneumonia shot. Use oxygen therapy and pulmonary rehabilitation if told by your doctor. If you need home oxygen therapy, ask your doctor if you should buy a tool to measure your oxygen level (oximeter). Make a COPD action plan with your doctor. This helps you   to know what to do if you feel worse than usual. Manage any other conditions you have as told by your doctor. Avoid going outside when it is very hot, cold, or humid. Avoid people who have a sickness you can catch (contagious). Keep all follow-up  visits. Contact a doctor if: You cough up more mucus than usual. There is a change in the color or thickness of the mucus. It is harder to breathe than usual. Your breathing is faster than usual. You have trouble sleeping. You need to use your medicines more often than usual. You have trouble doing your normal activities such as getting dressed or walking around the house. Get help right away if: You have shortness of breath while resting. You have shortness of breath that stops you from: Being able to talk. Doing normal activities. Your chest hurts for longer than 5 minutes. Your skin color is more blue than usual. Your pulse oximeter shows that you have low oxygen for longer than 5 minutes. You have a fever. You feel too tired to breathe normally. These symptoms may represent a serious problem that is an emergency. Do not wait to see if the symptoms will go away. Get medical help right away. Call your local emergency services (911 in the [place] Do not drive yourself to the hospital. Summary Chronic obstructive pulmonary disease (COPD) is a long-term lung problem. The way your lungs work will never return to normal. Usually the condition gets worse over time. There are things you can do to keep yourself as healthy as possible. Take over-the-counter and prescription medicines only as told by your doctor. If you smoke, stop. Smoking makes the problem worse. This information is not intended to replace advice given to you by your health care provider. Make sure you discuss any questions you have with your health care provider. Document Revised: 09/15/2020 Document Reviewed: 09/15/2020 Elsevier Patient Education  2023 [employer]  

## 2023-04-13 ENCOUNTER — Encounter: Payer: Self-pay | Admitting: Internal Medicine

## 2023-04-18 ENCOUNTER — Ambulatory Visit: Payer: Medicare Other | Admitting: Internal Medicine

## 2023-05-23 ENCOUNTER — Telehealth: Payer: Self-pay | Admitting: Internal Medicine

## 2023-05-23 NOTE — Telephone Encounter (Signed)
Lvm and sent mychart message to son to move 09/21/23 appointment-Toni

## 2023-08-07 ENCOUNTER — Other Ambulatory Visit: Payer: Self-pay

## 2023-08-07 MED ORDER — ALBUTEROL SULFATE HFA 108 (90 BASE) MCG/ACT IN AERS: 2.0000 | INHALATION_SPRAY | Freq: Four times a day (QID) | RESPIRATORY_TRACT | 3 refills | Status: DC | PRN

## 2023-08-07 NOTE — Telephone Encounter (Signed)
Lmom to pt son  that we send pres due to he left message for med refills for inhaler

## 2023-08-23 ENCOUNTER — Telehealth: Payer: Self-pay | Admitting: Internal Medicine

## 2023-08-23 NOTE — Telephone Encounter (Signed)
Notified son of CT appointment date, arrival time, location-Toni

## 2023-08-31 ENCOUNTER — Other Ambulatory Visit: Payer: Medicare Other

## 2023-09-01 ENCOUNTER — Ambulatory Visit
Admission: RE | Admit: 2023-09-01 | Discharge: 2023-09-01 | Disposition: A | Discharge status: Home / Self Care | Payer: Medicare Other | Source: Ambulatory Visit | Attending: Internal Medicine | Admitting: Internal Medicine

## 2023-09-01 DIAGNOSIS — J479 Bronchiectasis, uncomplicated: Secondary | ICD-10-CM | POA: Diagnosis not present

## 2023-09-01 DIAGNOSIS — A310 Pulmonary mycobacterial infection: Secondary | ICD-10-CM | POA: Diagnosis not present

## 2023-09-01 DIAGNOSIS — R634 Abnormal weight loss: Secondary | ICD-10-CM | POA: Insufficient documentation

## 2023-09-01 DIAGNOSIS — R918 Other nonspecific abnormal finding of lung field: Secondary | ICD-10-CM | POA: Diagnosis not present

## 2023-09-01 DIAGNOSIS — J90 Pleural effusion, not elsewhere classified: Secondary | ICD-10-CM | POA: Diagnosis not present

## 2023-09-18 ENCOUNTER — Ambulatory Visit: Payer: Medicare Other | Admitting: Physician Assistant

## 2023-09-18 ENCOUNTER — Encounter: Payer: Self-pay | Admitting: Internal Medicine

## 2023-09-18 ENCOUNTER — Ambulatory Visit (INDEPENDENT_AMBULATORY_CARE_PROVIDER_SITE_OTHER): Payer: Medicare Other | Admitting: Internal Medicine

## 2023-09-18 VITALS — BP 122/65 | HR 93 | Temp 98.3°F | Resp 16 | Ht 61.0 in | Wt 107.2 lb

## 2023-09-18 DIAGNOSIS — R634 Abnormal weight loss: Secondary | ICD-10-CM

## 2023-09-18 DIAGNOSIS — J9611 Chronic respiratory failure with hypoxia: Secondary | ICD-10-CM | POA: Diagnosis not present

## 2023-09-18 DIAGNOSIS — A310 Pulmonary mycobacterial infection: Secondary | ICD-10-CM

## 2023-09-18 DIAGNOSIS — Z23 Encounter for immunization: Secondary | ICD-10-CM

## 2023-09-18 DIAGNOSIS — J479 Bronchiectasis, uncomplicated: Secondary | ICD-10-CM | POA: Diagnosis not present

## 2023-09-18 NOTE — Progress Notes (Signed)
[facility] [address] [place], [place] [address]  Pulmonary Sleep Medicine   Office Visit Note  Patient Name: Breanna Christian DOB: Nov 29, 1940 MRN [id number]  Date of Service: 09/18/2023  Complaints/HPI: Follow up After CT chest. Some sputum retention noted has not noted any other major changes on the CT official report is pending. Noted nodule in the left lung seems to be the same  Office Spirometry Results:     ROS  General: (-) fever, (-) chills, (-) night sweats, (-) weakness Skin: (-) rashes, (-) itching,. Eyes: (-) visual changes, (-) redness, (-) itching. Nose and Sinuses: (-) nasal stuffiness or itchiness, (-) postnasal drip, (-) nosebleeds, (-) sinus trouble. Mouth and Throat: (-) sore throat, (-) hoarseness. Neck: (-) swollen glands, (-) enlarged thyroid, (-) neck pain. Respiratory: + cough, (-) bloody sputum, + shortness of breath, + wheezing. Cardiovascular: - ankle swelling, (-) chest pain. Lymphatic: (-) lymph node enlargement. Neurologic: (-) numbness, (-) tingling. Psychiatric: (-) anxiety, (-) depression   Current Medication: Outpatient Encounter Medications as of 09/18/2023  Medication Sig   albuterol (VENTOLIN HFA) 108 (90 Base) MCG/ACT inhaler Inhale 2 puffs into the lungs every 6 (six) hours as needed for wheezing or shortness of breath.   ALPRAZolam (XANAX) 0.25 MG tablet Take a 1/2 tablet 1 hour prior to procedure. If no improvement in symptoms, patient can take another 1/2 tablet.   ARTIFICIAL TEAR OP Place 1 drop into both eyes every 6 (six) hours as needed (dry eyes). A product from [place].   azithromycin (ZITHROMAX) 250 MG tablet TAKE 1 TABLET EVERY DAY   Budeson-Glycopyrrol-Formoterol (BREZTRI AEROSPHERE) 160-9-4.8 MCG/ACT AERO Inhale 2 puffs into the lungs 2 (two) times daily.   clotrimazole-betamethasone (LOTRISONE) cream APPLY TO AFFECTED AREA TWICE A DAY   ethambutol (MYAMBUTOL) 400 MG tablet Take 1 tablet (400 mg total) by  mouth daily.   ibandronate (BONIVA) 150 MG tablet TAKE 1 TAB EVERY 30 DAYS IN MORNING WITH FULL GLASS OF WATER ON EMPTY STOMACH. NO FOOD, DRINK, MEDS, LYING DOWN X1 HR   ibuprofen (ADVIL,MOTRIN) 200 MG tablet Take 200 mg by mouth as needed.   ipratropium-albuterol (DUONEB) 0.5-2.5 (3) MG/3ML SOLN Take 3 mLs by nebulization every 2 (two) hours as needed. DX: J44.9   Lifitegrast 5 % SOLN Apply 1 drop to eye in the morning and at bedtime. 1 Gtt both eyes twice a day   Multiple Vitamin (MULTI-VITAMINS) TABS Take 1 tablet by mouth daily. Reported on 04/25/2016   Omega-3 Fatty Acids (OMEGA-3 FISH OIL) 300 MG CAPS Take by mouth daily.   omeprazole (PRILOSEC) 20 MG capsule Take 1 capsule (20 mg total) by mouth daily.   rifampin (RIFADIN) 150 MG capsule TAKE 1 CAPSULE EVERY DAY   sodium chloride HYPERTONIC 3 % nebulizer solution Take by nebulization as needed for other.   No facility-administered encounter medications on file as of 09/18/2023.    Surgical History: Past Surgical History:  Procedure Laterality Date   APPENDECTOMY     BRONCHOSCOPY  02/2016   CATARACT EXTRACTION W/PHACO Right 10/26/2016   Procedure: CATARACT EXTRACTION PHACO AND INTRAOCULAR LENS PLACEMENT (IOC);  Surgeon: Sallee Lange, MD;  Location: [facility] ORS;  Service: Ophthalmology;  Laterality: Right;  [place] 2.17 AP% 26.5 CDE 56.86 Fluid pack lot # [id number]   FLEXIBLE BRONCHOSCOPY Bilateral 03/16/2016   Procedure: FLEXIBLE BRONCHOSCOPY;  Surgeon: Yevonne Pax, MD;  Location: [facility] ORS;  Service: Pulmonary;  Laterality: Bilateral;   FLEXIBLE BRONCHOSCOPY N/A 03/03/2017   Procedure: FLEXIBLE  BRONCHOSCOPY;  Surgeon: Yevonne Pax, MD;  Location: [facility] ORS;  Service: Pulmonary;  Laterality: N/A;   TUBAL LIGATION      Medical History: Past Medical History:  Diagnosis Date   Bronchiectasis (HCC) 2011   COPD (chronic obstructive pulmonary disease) (HCC)    HOH (hard of hearing)    Hypertension    Lung nodules    Mycobacterium avium  complex colonization    Non-Hodgkin lymphoma (HCC) 2003   Personal history of chemotherapy    Pneumonia    FREQUENT IN PAST    H/O MYCOBACTERIAL   Rheumatoid arthritis (HCC)    Sjogren's syndrome (HCC)    Xerostomia     Family History: Family History  Problem Relation Age of Onset   Cancer Sister    Breast cancer Neg Hx     Social History: Social History   Socioeconomic History   Marital status: Married    Spouse name: Not on file   Number of children: Not on file   Years of education: Not on file   Highest education level: Not on file  Occupational History   Not on file  Tobacco Use   Smoking status: Never   Smokeless tobacco: Never  Vaping Use   Vaping status: Never Used  Substance and Sexual Activity   Alcohol use: No   Drug use: No   Sexual activity: Not on file  Other Topics Concern   Not on file  Social History Narrative   Not on file   Social Determinants of Health   Financial Resource Strain: Not on file  Food Insecurity: Not on file  Transportation Needs: Not on file  Physical Activity: Not on file  Stress: Not on file  Social Connections: Not on file  Intimate Partner Violence: Not on file    Vital Signs: Temperature 98.3 F (36.8 C), resp. rate 16, height 5\' 1"  (1.549 m), weight 107 lb 3.2 oz (48.6 kg).  Examination: General Appearance: The patient is well-developed, well-nourished, and in no distress. Skin: Gross inspection of skin unremarkable. Head: normocephalic, no gross deformities. Eyes: no gross deformities noted. ENT: ears appear grossly normal no exudates. Neck: Supple. No thyromegaly. No LAD. Respiratory: few rhonchi noted. Cardiovascular: Normal S1 and S2 without murmur or rub. Extremities: No cyanosis. pulses are equal. Neurologic: Alert and oriented. No involuntary movements.  LABS: No results found for this or any previous visit (from the past 2160 hour(s)).  Radiology: No results found.  No results found.  No  results found.  Assessment and Plan: Patient Active Problem List   Diagnosis Date Noted   Encounter for general adult medical examination with abnormal findings 08/08/2020   Other fatigue 08/08/2020   Need for vaccination against Streptococcus pneumoniae using pneumococcal conjugate vaccine 7 08/08/2020   Age related osteoporosis 10/15/2018   Ovarian failure 10/15/2018   Screening for breast cancer 04/29/2018   Essential hypertension 04/29/2018   Obstructive chronic bronchitis without exacerbation (HCC) 04/29/2018   MAI (mycobacterium avium-intracellulare) (HCC) 02/08/2018   Numbness and tingling 03/25/2015   Myopathy 02/19/2015   Polyneuropathy 02/18/2015   Weakness of both lower extremities 02/18/2015   1. Chronic respiratory failure with hypoxia (HCC) She is oxygen dependent needs to continue with oxygen therapy as recommended.  2. MAI (mycobacterium avium-intracellulare) infection (HCC) On treatment this will be continued  3. Needs flu shot  - Flu vaccine, recombinant, trivalent, inj  4. Weight loss observed on examination We have not done an upper GI evaluation on her I  would write recommendation for barium swallow discussed with the son will get this scheduled - DG UGI W SMALL BOWEL; Future  5. Bronchiectasis without acute exacerbation (HCC) Significant bronchiectasis noted for chronic infection may benefit from chest PT and also discussed usage of flutter valve son stated he will get the flutter valve for her - Chest physiotherapy; Future - Flutter valve; Future   General Counseling: I have discussed the findings of the evaluation and examination with Renda.  I have also discussed any further diagnostic evaluation thatmay be needed or ordered today. Ashyah verbalizes understanding of the findings of todays visit. We also reviewed her medications today and discussed drug interactions and side effects including but not limited excessive drowsiness and altered  mental states. We also discussed that there is always a risk not just to her but also people around her. she has been encouraged to call the office with any questions or concerns that should arise related to todays visit.  Orders Placed This Encounter  Procedures   DG UGI W SMALL BOWEL    Standing Status:   Future    Standing Expiration Date:   09/17/2024    Order Specific Question:   Reason for Exam (SYMPTOM  OR DIAGNOSIS REQUIRED)    Answer:   weight loss    Order Specific Question:   Preferred Imaging Location?    Answer:   [facility]   Flu vaccine, recombinant, trivalent, inj   Chest physiotherapy    Standing Status:   Future    Standing Expiration Date:   09/17/2024   Flutter valve    Standing Status:   Future    Standing Expiration Date:   09/17/2024     Time spent: 68  I have personally obtained a history, examined the patient, evaluated laboratory and imaging results, formulated the assessment and plan and placed orders.    Yevonne Pax, MD [facility] Pulmonary and Critical Care Sleep medicine

## 2023-09-21 ENCOUNTER — Ambulatory Visit: Payer: Medicare Other | Admitting: Internal Medicine

## 2023-09-25 ENCOUNTER — Ambulatory Visit: Payer: Medicare Other | Admitting: Physician Assistant

## 2023-09-25 DIAGNOSIS — H538 Other visual disturbances: Secondary | ICD-10-CM | POA: Diagnosis not present

## 2023-09-25 DIAGNOSIS — H2512 Age-related nuclear cataract, left eye: Secondary | ICD-10-CM | POA: Diagnosis not present

## 2023-09-25 DIAGNOSIS — Z23 Encounter for immunization: Secondary | ICD-10-CM | POA: Diagnosis not present

## 2023-09-25 DIAGNOSIS — H16223 Keratoconjunctivitis sicca, not specified as Sjogren's, bilateral: Secondary | ICD-10-CM | POA: Diagnosis not present

## 2023-09-25 DIAGNOSIS — Z961 Presence of intraocular lens: Secondary | ICD-10-CM | POA: Diagnosis not present

## 2023-10-02 ENCOUNTER — Other Ambulatory Visit: Payer: Self-pay

## 2023-10-02 DIAGNOSIS — M810 Age-related osteoporosis without current pathological fracture: Secondary | ICD-10-CM

## 2023-10-02 MED ORDER — IBANDRONATE SODIUM 150 MG PO TABS: 150.0000 mg | ORAL_TABLET | ORAL | 1 refills | Status: DC

## 2023-10-05 ENCOUNTER — Telehealth: Payer: Self-pay | Admitting: Internal Medicine

## 2023-10-05 NOTE — Telephone Encounter (Signed)
error 

## 2023-10-11 ENCOUNTER — Ambulatory Visit: Payer: Medicare Other

## 2023-10-24 ENCOUNTER — Telehealth: Payer: Self-pay | Admitting: Internal Medicine

## 2023-10-24 NOTE — Telephone Encounter (Signed)
Left vm and sent mychart message to confirm 10/31/23 appointment-Toni

## 2023-10-25 ENCOUNTER — Telehealth: Payer: Self-pay | Admitting: Internal Medicine

## 2023-10-25 NOTE — Telephone Encounter (Signed)
Received o2 reverification from [employer]. Gave to DSK for signature-Toni

## 2023-10-31 ENCOUNTER — Encounter: Payer: Self-pay | Admitting: Internal Medicine

## 2023-10-31 ENCOUNTER — Ambulatory Visit: Payer: Medicare Other | Admitting: Internal Medicine

## 2023-10-31 ENCOUNTER — Telehealth: Payer: Self-pay | Admitting: Internal Medicine

## 2023-10-31 DIAGNOSIS — A310 Pulmonary mycobacterial infection: Secondary | ICD-10-CM | POA: Diagnosis not present

## 2023-10-31 DIAGNOSIS — Z0000 Encounter for general adult medical examination without abnormal findings: Secondary | ICD-10-CM

## 2023-10-31 DIAGNOSIS — R0609 Other forms of dyspnea: Secondary | ICD-10-CM

## 2023-10-31 DIAGNOSIS — R300 Dysuria: Secondary | ICD-10-CM | POA: Diagnosis not present

## 2023-10-31 DIAGNOSIS — H6123 Impacted cerumen, bilateral: Secondary | ICD-10-CM | POA: Diagnosis not present

## 2023-10-31 DIAGNOSIS — J4489 Other specified chronic obstructive pulmonary disease: Secondary | ICD-10-CM | POA: Diagnosis not present

## 2023-10-31 MED ORDER — BREZTRI AEROSPHERE 160-9-4.8 MCG/ACT IN AERO: 2.0000 | INHALATION_SPRAY | Freq: Two times a day (BID) | RESPIRATORY_TRACT | 0 refills | Status: DC

## 2023-10-31 MED ORDER — ETHAMBUTOL HCL 400 MG PO TABS: 400.0000 mg | ORAL_TABLET | Freq: Every day | ORAL | 0 refills | Status: DC

## 2023-10-31 MED ORDER — AZITHROMYCIN 250 MG PO TABS: 250.0000 mg | ORAL_TABLET | Freq: Every day | ORAL | 0 refills | Status: DC

## 2023-10-31 MED ORDER — LEVOFLOXACIN 500 MG PO TABS
500.0000 mg | ORAL_TABLET | Freq: Every day | ORAL | 0 refills | Status: DC
Start: 2023-10-31 — End: 2023-12-06

## 2023-10-31 NOTE — Progress Notes (Signed)
[facility] [address] [place], [place] [address]  Internal MEDICINE  Office Visit Note  Patient Name: Breanna Christian  [id number]  [id number]  Date of Service: 11/08/2023  Chief Complaint  Patient presents with   Medicare Wellness   Hypertension   Medication Refill    Patient needs 1 month supply of: Azithromycin, Ethambutol, Breztri (all going to [facility])   Quality Metric Gaps    TDAP, Shingles vaccines     HPI Pt is here for routine health maintenance examination She is to her baseline, ILD with end stage lung disease on home O2  She is also on antibiotics/ antiTb for atypical mycobacterium leading to long term lung sequela She is here with her son, speaks [place] but able to understand   Recent CT scan results are as follows  IMPRESSION: a. Bilateral bronchiectasis and numerous pulmonary nodules, with interval decreased size of right lower lobe and right upper lobe consolidations. Additional areas of waxing and waning nodularity are seen. Findings are consistent with chronic atypical infection, likely non tuberculous mycobacterial. Recommend continued follow-up based on clinical protocol and patient risk factors. b. Stable right middle lobe bronchiectasis. c. Unchanged small right pleural effusion. d. Aortic Atherosclerosis (ICD10-I70.0).  5. Her UGI is pending, both tests were ordered by pulmonary 6. According to her son, she is eating better and appetite is back  7. I did feel pt was a bit more sob during conversation   Current Medication: Outpatient Encounter Medications as of 10/31/2023  Medication Sig   albuterol (VENTOLIN HFA) 108 (90 Base) MCG/ACT inhaler Inhale 2 puffs into the lungs every 6 (six) hours as needed for wheezing or shortness of breath.   ALPRAZolam (XANAX) 0.25 MG tablet Take a 1/2 tablet 1 hour prior to procedure. If no improvement in symptoms, patient can take another 1/2 tablet.   ARTIFICIAL TEAR OP Place 1 drop into  both eyes every 6 (six) hours as needed (dry eyes). A product from [place].   clotrimazole-betamethasone (LOTRISONE) cream APPLY TO AFFECTED AREA TWICE A DAY   ibandronate (BONIVA) 150 MG tablet Take 1 tablet (150 mg total) by mouth every 30 (thirty) days. Take in the morning with a full glass of water, on an empty stomach, and do not take anything else by mouth or lie down for the next 30 min.   ibuprofen (ADVIL,MOTRIN) 200 MG tablet Take 200 mg by mouth as needed.   ipratropium-albuterol (DUONEB) 0.5-2.5 (3) MG/3ML SOLN Take 3 mLs by nebulization every 2 (two) hours as needed. DX: J44.9   levofloxacin (LEVAQUIN) 500 MG tablet Take 1 tablet (500 mg total) by mouth daily.   Lifitegrast 5 % SOLN Apply 1 drop to eye in the morning and at bedtime. 1 Gtt both eyes twice a day   Multiple Vitamin (MULTI-VITAMINS) TABS Take 1 tablet by mouth daily. Reported on 04/25/2016   Omega-3 Fatty Acids (OMEGA-3 FISH OIL) 300 MG CAPS Take by mouth daily.   omeprazole (PRILOSEC) 20 MG capsule Take 1 capsule (20 mg total) by mouth daily.   rifampin (RIFADIN) 150 MG capsule TAKE 1 CAPSULE EVERY DAY   sodium chloride HYPERTONIC 3 % nebulizer solution Take by nebulization as needed for other.   [DISCONTINUED] azithromycin (ZITHROMAX) 250 MG tablet TAKE 1 TABLET EVERY DAY   [DISCONTINUED] Budeson-Glycopyrrol-Formoterol (BREZTRI AEROSPHERE) 160-9-4.8 MCG/ACT AERO Inhale 2 puffs into the lungs 2 (two) times daily.   [DISCONTINUED] ethambutol (MYAMBUTOL) 400 MG tablet Take 1 tablet (400 mg total) by mouth daily.  azithromycin (ZITHROMAX) 250 MG tablet Take 1 tablet (250 mg total) by mouth daily.   Budeson-Glycopyrrol-Formoterol (BREZTRI AEROSPHERE) 160-9-4.8 MCG/ACT AERO Inhale 2 puffs into the lungs 2 (two) times daily.   ethambutol (MYAMBUTOL) 400 MG tablet Take 1 tablet (400 mg total) by mouth daily.   No facility-administered encounter medications on file as of 10/31/2023.    Surgical History: Past Surgical History:   Procedure Laterality Date   APPENDECTOMY     BRONCHOSCOPY  02/2016   CATARACT EXTRACTION W/PHACO Right 10/26/2016   Procedure: CATARACT EXTRACTION PHACO AND INTRAOCULAR LENS PLACEMENT (IOC);  Surgeon: Sallee Lange, MD;  Location: [facility] ORS;  Service: Ophthalmology;  Laterality: Right;  [place] 2.17 AP% 26.5 CDE 56.86 Fluid pack lot # [id number]   FLEXIBLE BRONCHOSCOPY Bilateral 03/16/2016   Procedure: FLEXIBLE BRONCHOSCOPY;  Surgeon: Yevonne Pax, MD;  Location: [facility] ORS;  Service: Pulmonary;  Laterality: Bilateral;   FLEXIBLE BRONCHOSCOPY N/A 03/03/2017   Procedure: FLEXIBLE BRONCHOSCOPY;  Surgeon: Yevonne Pax, MD;  Location: [facility] ORS;  Service: Pulmonary;  Laterality: N/A;   TUBAL LIGATION      Medical History: Past Medical History:  Diagnosis Date   Bronchiectasis (HCC) 2011   COPD (chronic obstructive pulmonary disease) (HCC)    HOH (hard of hearing)    Hypertension    Lung nodules    Mycobacterium avium complex colonization    Non-Hodgkin lymphoma (HCC) 2003   Personal history of chemotherapy    Pneumonia    FREQUENT IN PAST    H/O MYCOBACTERIAL   Rheumatoid arthritis (HCC)    Sjogren's syndrome (HCC)    Xerostomia     Family History: Family History  Problem Relation Age of Onset   Cancer Sister    Breast cancer Neg Hx     Social History: Social History   Socioeconomic History   Marital status: Married    Spouse name: Not on file   Number of children: Not on file   Years of education: Not on file   Highest education level: Not on file  Occupational History   Not on file  Tobacco Use   Smoking status: Never   Smokeless tobacco: Never  Vaping Use   Vaping status: Never Used  Substance and Sexual Activity   Alcohol use: No   Drug use: No   Sexual activity: Not on file  Other Topics Concern   Not on file  Social History Narrative   Not on file   Social Drivers of Health   Financial Resource Strain: Not on file  Food Insecurity: Not on file   Transportation Needs: Not on file  Physical Activity: Not on file  Stress: Not on file  Social Connections: Not on file      Review of Systems  Constitutional:  Positive for appetite change. Negative for fatigue and fever.  HENT:  Negative for congestion, mouth sores and postnasal drip.   Respiratory:  Positive for shortness of breath. Negative for cough.   Cardiovascular:  Negative for chest pain.  Genitourinary:  Negative for flank pain.  Psychiatric/Behavioral: Negative.       Vital Signs: BP 110/70   Pulse 98   Temp 98.2 F (36.8 C)   Resp 16   Ht 5\' 1"  (1.549 m)   Wt 108 lb 12.8 oz (49.4 kg)   SpO2 94%   BMI 20.56 kg/m    Physical Exam Constitutional:      Appearance: Normal appearance.  HENT:     Head: Normocephalic and atraumatic.  Right Ear: There is impacted cerumen.     Left Ear: There is impacted cerumen.     Nose: Nose normal.     Mouth/Throat:     Mouth: Mucous membranes are moist.     Pharynx: No posterior oropharyngeal erythema.  Eyes:     Extraocular Movements: Extraocular movements intact.     Pupils: Pupils are equal, round, and reactive to light.  Cardiovascular:     Rate and Rhythm: Normal rate.     Pulses: Normal pulses.     Heart sounds: Murmur (loud systolic murmur) heard.  Pulmonary:     Effort: Respiratory distress (mild) present.     Breath sounds: Rhonchi present.  Musculoskeletal:     Cervical back: Normal range of motion.  Skin:    General: Skin is warm and dry.  Neurological:     General: No focal deficit present.     Mental Status: She is alert.  Psychiatric:        Mood and Affect: Mood normal.        Behavior: Behavior normal.     Assessment/Plan: 1. Routine general medical examination at a health care facility All pHM is updated   2. MAI (mycobacterium avium-intracellulare) infection (HCC) Will continue therapy per pulmonary  - azithromycin (ZITHROMAX) 250 MG tablet; Take 1 tablet (250 mg total) by mouth  daily.  Dispense: 30 tablet; Refill: 0 - ethambutol (MYAMBUTOL) 400 MG tablet; Take 1 tablet (400 mg total) by mouth daily.  Dispense: 30 tablet; Refill: 0 - ANA Direct w/Reflex if Positive - CBC with Differential/Platelet - Basic Metabolic Panel (BMET)  3. Obstructive chronic bronchitis without exacerbation (HCC) Pt did have more rales and ronchi on her exa, gold Azithromycin while she will take Levaquin  - Budeson-Glycopyrrol-Formoterol (BREZTRI AEROSPHERE) 160-9-4.8 MCG/ACT AERO; Inhale 2 puffs into the lungs 2 (two) times daily.  Dispense: 1 each; Refill: 0 - levofloxacin (LEVAQUIN) 500 MG tablet; Take 1 tablet (500 mg total) by mouth daily.  Dispense: 7 tablet; Refill: 0  4. Other form of dyspnea Worsening murmur along with worsening dyspnea, will be beneficial to get input from cardio - Ambulatory referral to Cardiology - ANA Direct w/Reflex if Positive - CBC with Differential/Platelet - Basic Metabolic Panel (BMET)  5. Bilateral impacted cerumen - Ambulatory referral to ENT  6. Dysuria - UA/M w/rflx Culture, Routine - Microscopic Examination - Urine Culture, Reflex   General Counseling: Angelik verbalizes understanding of the findings of todays visit and agrees with plan of treatment. I have discussed any further diagnostic evaluation that may be needed or ordered today. We also reviewed her medications today. she has been encouraged to call the office with any questions or concerns that should arise related to todays visit.  I will like to discuss advance directives on her on next visit   Counseling:  [place] Controlled Substance Database was reviewed by me.  Orders Placed This Encounter  Procedures   Microscopic Examination   Urine Culture, Reflex   UA/M w/rflx Culture, Routine   ANA Direct w/Reflex if Positive   CBC with Differential/Platelet   Basic Metabolic Panel (BMET)   Ambulatory referral to ENT   Ambulatory referral to Cardiology    Meds ordered this  encounter  Medications   azithromycin (ZITHROMAX) 250 MG tablet    Sig: Take 1 tablet (250 mg total) by mouth daily.    Dispense:  30 tablet    Refill:  0   ethambutol (MYAMBUTOL) 400 MG tablet  Sig: Take 1 tablet (400 mg total) by mouth daily.    Dispense:  30 tablet    Refill:  0   Budeson-Glycopyrrol-Formoterol (BREZTRI AEROSPHERE) 160-9-4.8 MCG/ACT AERO    Sig: Inhale 2 puffs into the lungs 2 (two) times daily.    Dispense:  1 each    Refill:  0   levofloxacin (LEVAQUIN) 500 MG tablet    Sig: Take 1 tablet (500 mg total) by mouth daily.    Dispense:  7 tablet    Refill:  0    Total time spent:45 Minutes  Time spent includes review of chart, medications, test results, and follow up plan with the patient.     Lyndon Code, MD  Internal Medicine

## 2023-10-31 NOTE — Telephone Encounter (Signed)
Awaiting 10/31/23 office notes for Otolaryngology referral-Toni

## 2023-11-02 ENCOUNTER — Telehealth: Payer: Self-pay | Admitting: Internal Medicine

## 2023-11-02 ENCOUNTER — Ambulatory Visit
Admission: RE | Admit: 2023-11-02 | Discharge: 2023-11-02 | Disposition: A | Discharge status: Home / Self Care | Payer: Medicare Other | Source: Ambulatory Visit | Attending: Internal Medicine | Admitting: Internal Medicine

## 2023-11-02 DIAGNOSIS — R634 Abnormal weight loss: Secondary | ICD-10-CM | POA: Diagnosis not present

## 2023-11-02 DIAGNOSIS — Z4682 Encounter for fitting and adjustment of non-vascular catheter: Secondary | ICD-10-CM | POA: Diagnosis not present

## 2023-11-02 NOTE — Telephone Encounter (Addendum)
O2 CMN signed, faxed back to [employer] with office notes; [phone]. Scanned-Toni

## 2023-11-02 NOTE — Telephone Encounter (Signed)
Received O2 cmn. Gave to DSK for signature-Toni

## 2023-11-03 LAB — UA/M W/RFLX CULTURE, ROUTINE
Bilirubin, UA: NEGATIVE
Glucose, UA: NEGATIVE
Ketones, UA: NEGATIVE
Nitrite, UA: NEGATIVE
Protein,UA: NEGATIVE
RBC, UA: NEGATIVE
Specific Gravity, UA: 1.015 (ref 1.005–1.030)
Urobilinogen, Ur: 1 mg/dL (ref 0.2–1.0)
pH, UA: 6.5 (ref 5.0–7.5)

## 2023-11-03 LAB — MICROSCOPIC EXAMINATION: Casts: NONE SEEN /[LPF]

## 2023-11-03 LAB — URINE CULTURE, REFLEX

## 2023-11-03 NOTE — Telephone Encounter (Signed)
error 

## 2023-11-07 ENCOUNTER — Telehealth: Payer: Self-pay

## 2023-11-07 ENCOUNTER — Other Ambulatory Visit: Payer: Self-pay

## 2023-11-07 MED ORDER — NITROFURANTOIN MONOHYD MACRO 100 MG PO CAPS: 100.0000 mg | ORAL_CAPSULE | Freq: Two times a day (BID) | ORAL | 0 refills | Status: DC

## 2023-11-07 NOTE — Progress Notes (Signed)
She does have UTI, might have to take another abx, macrobid 100 mg po bid x 7 days.

## 2023-11-07 NOTE — Telephone Encounter (Signed)
Patient son was notified

## 2023-11-07 NOTE — Telephone Encounter (Signed)
-----   Message from [facility] sent at 11/07/2023 11:29 AM EST ----- She does have UTI, might have to take another abx, macrobid 100 mg po bid x 7 days.

## 2023-11-09 ENCOUNTER — Telehealth: Payer: Self-pay | Admitting: Internal Medicine

## 2023-11-09 NOTE — Progress Notes (Signed)
Not sure if this was taken care of

## 2023-11-09 NOTE — Telephone Encounter (Signed)
Otolaryngology referral sent via [facility] to [facility].  Notified patient's son. Gave telephone # [phone]

## 2023-11-10 NOTE — Progress Notes (Signed)
done

## 2023-11-23 ENCOUNTER — Encounter: Payer: Self-pay | Admitting: Nurse Practitioner

## 2023-11-27 ENCOUNTER — Encounter: Payer: Self-pay | Admitting: Nurse Practitioner

## 2023-11-27 ENCOUNTER — Ambulatory Visit (INDEPENDENT_AMBULATORY_CARE_PROVIDER_SITE_OTHER): Payer: Medicare Other | Admitting: Nurse Practitioner

## 2023-11-27 VITALS — BP 130/68 | HR 100 | Temp 98.7°F | Resp 16 | Ht 61.0 in | Wt 106.6 lb

## 2023-11-27 DIAGNOSIS — J9611 Chronic respiratory failure with hypoxia: Secondary | ICD-10-CM

## 2023-11-27 DIAGNOSIS — N390 Urinary tract infection, site not specified: Secondary | ICD-10-CM

## 2023-11-27 DIAGNOSIS — M810 Age-related osteoporosis without current pathological fracture: Secondary | ICD-10-CM | POA: Diagnosis not present

## 2023-11-27 DIAGNOSIS — F064 Anxiety disorder due to known physiological condition: Secondary | ICD-10-CM

## 2023-11-27 DIAGNOSIS — J4489 Other specified chronic obstructive pulmonary disease: Secondary | ICD-10-CM

## 2023-11-27 DIAGNOSIS — A310 Pulmonary mycobacterial infection: Secondary | ICD-10-CM

## 2023-11-27 DIAGNOSIS — G479 Sleep disorder, unspecified: Secondary | ICD-10-CM

## 2023-11-27 LAB — POCT URINALYSIS DIPSTICK
Bilirubin, UA: NEGATIVE
Glucose, UA: NEGATIVE
Leukocytes, UA: NEGATIVE
Nitrite, UA: NEGATIVE
Protein, UA: NEGATIVE
Spec Grav, UA: 1.02 (ref 1.010–1.025)
Urobilinogen, UA: 0.2 U/dL
pH, UA: 5 (ref 5.0–8.0)

## 2023-11-27 MED ORDER — ETHAMBUTOL HCL 400 MG PO TABS: 400.0000 mg | ORAL_TABLET | Freq: Every day | ORAL | 0 refills | Status: DC

## 2023-11-27 MED ORDER — BREZTRI AEROSPHERE 160-9-4.8 MCG/ACT IN AERO
2.0000 | INHALATION_SPRAY | Freq: Two times a day (BID) | RESPIRATORY_TRACT | 0 refills | Status: DC
Start: 2023-11-27 — End: 2024-03-05

## 2023-11-27 MED ORDER — ALBUTEROL SULFATE HFA 108 (90 BASE) MCG/ACT IN AERS: 2.0000 | INHALATION_SPRAY | Freq: Four times a day (QID) | RESPIRATORY_TRACT | 3 refills | Status: DC | PRN

## 2023-11-27 MED ORDER — IBANDRONATE SODIUM 150 MG PO TABS: 150.0000 mg | ORAL_TABLET | ORAL | 1 refills | Status: DC

## 2023-11-27 MED ORDER — AZITHROMYCIN 250 MG PO TABS: 250.0000 mg | ORAL_TABLET | Freq: Every day | ORAL | 0 refills | Status: DC

## 2023-11-27 MED ORDER — RIFAMPIN 150 MG PO CAPS: ORAL_CAPSULE | ORAL | 0 refills | Status: DC

## 2023-11-27 MED ORDER — ALPRAZOLAM 0.25 MG PO TABS: 0.1250 mg | ORAL_TABLET | Freq: Every day | ORAL | 0 refills | Status: DC | PRN

## 2023-11-27 NOTE — Progress Notes (Signed)
 [facility] [address] [place], [place] [address]  Internal MEDICINE  Office Visit Note  Patient Name: Breanna Christian  [id number]  [id number]  Date of Service: 11/27/2023  Chief Complaint  Patient presents with   Hypertension   Follow-up    Anxiety, still coughing, not sleeping good    HPI Breanna Christian presents for a follow-up visit for recent UTI, COPD, recent exacerbation, and increased anxiety.  Need urinalysis to see if uti is gone. Was recently treated with macrobid .  Switched azithromycin  to levofloxacin  a while back, still has a wet cough and wants to see if this has been approved  New scripts for medicare part D switching to [facility]  Increased anxiety -- can't sleep, feels like she is suffocating when she lays down to sleep.  Has upcoming cardiology visit related to the murmur.    Current Medication: Outpatient Encounter Medications as of 11/27/2023  Medication Sig   ALPRAZolam  (XANAX ) 0.25 MG tablet Take 0.5-1 tablets (0.125-0.25 mg total) by mouth daily as needed for anxiety or sleep.   ARTIFICIAL TEAR OP Place 1 drop into both eyes every 6 (six) hours as needed (dry eyes). A product from [place].   clotrimazole -betamethasone  (LOTRISONE ) cream APPLY TO AFFECTED AREA TWICE A DAY   ibuprofen  (ADVIL ,MOTRIN ) 200 MG tablet Take 200 mg by mouth as needed.   ipratropium-albuterol  (DUONEB) 0.5-2.5 (3) MG/3ML SOLN Take 3 mLs by nebulization every 2 (two) hours as needed. DX: J44.9   levofloxacin  (LEVAQUIN ) 500 MG tablet Take 1 tablet (500 mg total) by mouth daily.   Lifitegrast  5 % SOLN Apply 1 drop to eye in the morning and at bedtime. 1 Gtt both eyes twice a day   Multiple Vitamin (MULTI-VITAMINS) TABS Take 1 tablet by mouth daily. Reported on 04/25/2016   nitrofurantoin , macrocrystal-monohydrate, (MACROBID ) 100 MG capsule Take 1 capsule (100 mg total) by mouth 2 (two) times daily.   Omega-3 Fatty Acids (OMEGA-3 FISH OIL) 300 MG CAPS Take by mouth daily.    sodium chloride  HYPERTONIC 3 % nebulizer solution Take by nebulization as needed for other.   [DISCONTINUED] albuterol  (VENTOLIN  HFA) 108 (90 Base) MCG/ACT inhaler Inhale 2 puffs into the lungs every 6 (six) hours as needed for wheezing or shortness of breath.   [DISCONTINUED] ALPRAZolam  (XANAX ) 0.25 MG tablet Take a 1/2 tablet 1 hour prior to procedure. If no improvement in symptoms, patient can take another 1/2 tablet.   [DISCONTINUED] azithromycin  (ZITHROMAX ) 250 MG tablet Take 1 tablet (250 mg total) by mouth daily.   [DISCONTINUED] Budeson-Glycopyrrol-Formoterol  (BREZTRI  AEROSPHERE) 160-9-4.8 MCG/ACT AERO Inhale 2 puffs into the lungs 2 (two) times daily.   [DISCONTINUED] ethambutol  (MYAMBUTOL ) 400 MG tablet Take 1 tablet (400 mg total) by mouth daily.   [DISCONTINUED] ibandronate  (BONIVA ) 150 MG tablet Take 1 tablet (150 mg total) by mouth every 30 (thirty) days. Take in the morning with a full glass of water, on an empty stomach, and do not take anything else by mouth or lie down for the next 30 min.   [DISCONTINUED] omeprazole  (PRILOSEC) 20 MG capsule Take 1 capsule (20 mg total) by mouth daily.   [DISCONTINUED] rifampin  (RIFADIN ) 150 MG capsule TAKE 1 CAPSULE EVERY DAY   albuterol  (VENTOLIN  HFA) 108 (90 Base) MCG/ACT inhaler Inhale 2 puffs into the lungs every 6 (six) hours as needed for wheezing or shortness of breath.   azithromycin  (ZITHROMAX ) 250 MG tablet Take 1 tablet (250 mg total) by mouth daily.   Budeson-Glycopyrrol-Formoterol  (BREZTRI  AEROSPHERE) 160-9-4.8 MCG/ACT  AERO Inhale 2 puffs into the lungs 2 (two) times daily.   ethambutol  (MYAMBUTOL ) 400 MG tablet Take 1 tablet (400 mg total) by mouth daily.   ibandronate  (BONIVA ) 150 MG tablet Take 1 tablet (150 mg total) by mouth every 30 (thirty) days. Take in the morning with a full glass of water, on an empty stomach, and do not take anything else by mouth or lie down for the next 30 min.   rifampin  (RIFADIN ) 150 MG capsule TAKE 1  CAPSULE EVERY DAY   No facility-administered encounter medications on file as of 11/27/2023.    Surgical History: Past Surgical History:  Procedure Laterality Date   APPENDECTOMY     BRONCHOSCOPY  02/2016   CATARACT EXTRACTION W/PHACO Right 10/26/2016   Procedure: CATARACT EXTRACTION PHACO AND INTRAOCULAR LENS PLACEMENT (IOC);  Surgeon: Steven Dingeldein, MD;  Location: [facility] ORS;  Service: Ophthalmology;  Laterality: Right;  US  2.17 AP% 26.5 CDE 56.86 Fluid pack lot # [id number]   FLEXIBLE BRONCHOSCOPY Bilateral 03/16/2016   Procedure: FLEXIBLE BRONCHOSCOPY;  Surgeon: Elfreda DELENA Bathe, MD;  Location: [facility] ORS;  Service: Pulmonary;  Laterality: Bilateral;   FLEXIBLE BRONCHOSCOPY N/A 03/03/2017   Procedure: FLEXIBLE BRONCHOSCOPY;  Surgeon: Elfreda DELENA Bathe, MD;  Location: [facility] ORS;  Service: Pulmonary;  Laterality: N/A;   TUBAL LIGATION      Medical History: Past Medical History:  Diagnosis Date   Bronchiectasis (HCC) 2011   COPD (chronic obstructive pulmonary disease) (HCC)    HOH (hard of hearing)    Hypertension    Lung nodules    Mycobacterium avium complex colonization    Non-Hodgkin lymphoma (HCC) 2003   Personal history of chemotherapy    Pneumonia    FREQUENT IN PAST    H/O MYCOBACTERIAL   Rheumatoid arthritis (HCC)    Sjogren's syndrome (HCC)    Xerostomia     Family History: Family History  Problem Relation Age of Onset   Cancer Sister    Breast cancer Neg Hx     Social History   Socioeconomic History   Marital status: Married    Spouse name: Not on file   Number of children: Not on file   Years of education: Not on file   Highest education level: Not on file  Occupational History   Not on file  Tobacco Use   Smoking status: Never   Smokeless tobacco: Never  Vaping Use   Vaping status: Never Used  Substance and Sexual Activity   Alcohol  use: No   Drug use: No   Sexual activity: Not on file  Other Topics Concern   Not on file  Social History Narrative    Not on file   Social Drivers of Health   Financial Resource Strain: Not on file  Food Insecurity: Not on file  Transportation Needs: Not on file  Physical Activity: Not on file  Stress: Not on file  Social Connections: Not on file  Intimate Partner Violence: Not on file      Review of Systems  Constitutional:  Positive for fatigue.  Respiratory:  Positive for cough, chest tightness, shortness of breath and wheezing.   Cardiovascular: Negative.  Negative for chest pain and palpitations.  Gastrointestinal: Negative.   Genitourinary: Negative.   Musculoskeletal:  Positive for arthralgias.  Neurological:  Positive for weakness.  Psychiatric/Behavioral:  Positive for sleep disturbance. Negative for self-injury and suicidal ideas. The patient is nervous/anxious.     Vital Signs: BP 130/68   Pulse 100   Temp  98.7 F (37.1 C)   Resp 16   Ht 5' 1 (1.549 m)   Wt 106 lb 9.6 oz (48.4 kg)   SpO2 94% Comment: 2L  BMI 20.14 kg/m    Physical Exam Cardiovascular:     Rate and Rhythm: Regular rhythm. Tachycardia present.     Heart sounds: Murmur heard.  Pulmonary:     Effort: Tachypnea and accessory muscle usage present. No respiratory distress.     Breath sounds: Decreased air movement present. Examination of the right-lower field reveals wheezing. Examination of the left-lower field reveals decreased breath sounds. Decreased breath sounds and wheezing present.        Assessment/Plan: 1. Chronic respiratory failure with hypoxia (HCC) (Primary) Stat CT chest ordered. Continue supplemental oxygen  as prescribed and continue prn albuterol  as prescribed.  - albuterol  (VENTOLIN  HFA) 108 (90 Base) MCG/ACT inhaler; Inhale 2 puffs into the lungs every 6 (six) hours as needed for wheezing or shortness of breath.  Dispense: 18 each; Refill: 3 - CT CHEST WO CONTRAST; Future  2. Obstructive chronic bronchitis without exacerbation (HCC) Continue medications as prescribed. Refills ordered   - albuterol  (VENTOLIN  HFA) 108 (90 Base) MCG/ACT inhaler; Inhale 2 puffs into the lungs every 6 (six) hours as needed for wheezing or shortness of breath.  Dispense: 18 each; Refill: 3 - Budeson-Glycopyrrol-Formoterol  (BREZTRI  AEROSPHERE) 160-9-4.8 MCG/ACT AERO; Inhale 2 puffs into the lungs 2 (two) times daily.  Dispense: 1 each; Refill: 0 - CT CHEST WO CONTRAST; Future  3. MAI (mycobacterium avium-intracellulare) infection (HCC) Medications continued, due for refills, continue as prescribed.  - ethambutol  (MYAMBUTOL ) 400 MG tablet; Take 1 tablet (400 mg total) by mouth daily.  Dispense: 30 tablet; Refill: 0 - azithromycin  (ZITHROMAX ) 250 MG tablet; Take 1 tablet (250 mg total) by mouth daily.  Dispense: 30 tablet; Refill: 0 - rifampin  (RIFADIN ) 150 MG capsule; TAKE 1 CAPSULE EVERY DAY  Dispense: 90 capsule; Refill: 0 - albuterol  (VENTOLIN  HFA) 108 (90 Base) MCG/ACT inhaler; Inhale 2 puffs into the lungs every 6 (six) hours as needed for wheezing or shortness of breath.  Dispense: 18 each; Refill: 3 - CT CHEST WO CONTRAST; Future  4. Sleep disturbance Temporarily will try small dose of alprazolam . Will consider antidepressant that can help with sleep  - ALPRAZolam  (XANAX ) 0.25 MG tablet; Take 0.5-1 tablets (0.125-0.25 mg total) by mouth daily as needed for anxiety or sleep.  Dispense: 20 tablet; Refill: 0  5. Recurrent UTI Urinalysis positive for blood, negative for nitrites and leukocytes - POCT Urinalysis Dipstick - CULTURE, URINE COMPREHENSIVE  6. Age related osteoporosis, unspecified pathological fracture presence Continue boniva  as prescribed  - ibandronate  (BONIVA ) 150 MG tablet; Take 1 tablet (150 mg total) by mouth every 30 (thirty) days. Take in the morning with a full glass of water, on an empty stomach, and do not take anything else by mouth or lie down for the next 30 min.  Dispense: 2 tablet; Refill: 1  7. Anxiety disorder due to known physiological condition Small dose of  alprazolam  for now, consider mirtazapine  for long term - ALPRAZolam  (XANAX ) 0.25 MG tablet; Take 0.5-1 tablets (0.125-0.25 mg total) by mouth daily as needed for anxiety or sleep.  Dispense: 20 tablet; Refill: 0   General Counseling: Breanna Christian verbalizes understanding of the findings of todays visit and agrees with plan of treatment. I have discussed any further diagnostic evaluation that may be needed or ordered today. We also reviewed her medications today. she has been encouraged to call the  office with any questions or concerns that should arise related to todays visit.    Orders Placed This Encounter  Procedures   CULTURE, URINE COMPREHENSIVE   CT CHEST WO CONTRAST   POCT Urinalysis Dipstick    Meds ordered this encounter  Medications   ibandronate  (BONIVA ) 150 MG tablet    Sig: Take 1 tablet (150 mg total) by mouth every 30 (thirty) days. Take in the morning with a full glass of water, on an empty stomach, and do not take anything else by mouth or lie down for the next 30 min.    Dispense:  2 tablet    Refill:  1   ethambutol  (MYAMBUTOL ) 400 MG tablet    Sig: Take 1 tablet (400 mg total) by mouth daily.    Dispense:  30 tablet    Refill:  0   azithromycin  (ZITHROMAX ) 250 MG tablet    Sig: Take 1 tablet (250 mg total) by mouth daily.    Dispense:  30 tablet    Refill:  0   rifampin  (RIFADIN ) 150 MG capsule    Sig: TAKE 1 CAPSULE EVERY DAY    Dispense:  90 capsule    Refill:  0   albuterol  (VENTOLIN  HFA) 108 (90 Base) MCG/ACT inhaler    Sig: Inhale 2 puffs into the lungs every 6 (six) hours as needed for wheezing or shortness of breath.    Dispense:  18 each    Refill:  3   Budeson-Glycopyrrol-Formoterol  (BREZTRI  AEROSPHERE) 160-9-4.8 MCG/ACT AERO    Sig: Inhale 2 puffs into the lungs 2 (two) times daily.    Dispense:  1 each    Refill:  0   ALPRAZolam  (XANAX ) 0.25 MG tablet    Sig: Take 0.5-1 tablets (0.125-0.25 mg total) by mouth daily as needed for anxiety or sleep.     Dispense:  20 tablet    Refill:  0    Fill new script today    Return in about 2 weeks (around 12/11/2023) for F/U with DFK.   Total time spent:30 Minutes Time spent includes review of chart, medications, test results, and follow up plan with the patient.   [place] Controlled Substance Database was reviewed by me.  This patient was seen by Mardy Maxin, FNP-C in collaboration with Dr. Sigrid Bathe as a part of collaborative care agreement.   Delores Edelstein R. Maxin, MSN, FNP-C Internal medicine

## 2023-11-28 ENCOUNTER — Ambulatory Visit
Admission: RE | Admit: 2023-11-28 | Discharge: 2023-11-28 | Disposition: A | Discharge status: Home / Self Care | Payer: Medicare Other | Source: Ambulatory Visit | Attending: Nurse Practitioner

## 2023-11-28 ENCOUNTER — Encounter: Payer: Self-pay | Admitting: Nurse Practitioner

## 2023-11-28 DIAGNOSIS — J9611 Chronic respiratory failure with hypoxia: Secondary | ICD-10-CM

## 2023-11-28 DIAGNOSIS — J90 Pleural effusion, not elsewhere classified: Secondary | ICD-10-CM | POA: Diagnosis not present

## 2023-11-28 DIAGNOSIS — I700 Atherosclerosis of aorta: Secondary | ICD-10-CM | POA: Diagnosis not present

## 2023-11-28 DIAGNOSIS — R918 Other nonspecific abnormal finding of lung field: Secondary | ICD-10-CM | POA: Diagnosis not present

## 2023-11-28 DIAGNOSIS — A310 Pulmonary mycobacterial infection: Secondary | ICD-10-CM

## 2023-11-28 DIAGNOSIS — J4489 Other specified chronic obstructive pulmonary disease: Secondary | ICD-10-CM

## 2023-11-28 DIAGNOSIS — N390 Urinary tract infection, site not specified: Secondary | ICD-10-CM

## 2023-11-28 DIAGNOSIS — J479 Bronchiectasis, uncomplicated: Secondary | ICD-10-CM | POA: Diagnosis not present

## 2023-11-28 NOTE — Progress Notes (Signed)
 Updated Ct chest, previous was done in October last year and she was treated with a round of levofloxacin  in December. There is further progression of bilateral pulmonary nodules and the small right pleural effusion has slightly increased since October, chronic complete consolidation of right middle lobe and mild bilateral lower lobe predominant bronchiectasis. She is not scheduled to see you until April this year. Is that still appropriate or would you like to see her sooner?

## 2023-11-29 ENCOUNTER — Telehealth: Payer: Self-pay | Admitting: Internal Medicine

## 2023-11-29 NOTE — Telephone Encounter (Signed)
 Lvm to schedule 2 wk f/u-Toni

## 2023-11-30 LAB — CULTURE, URINE COMPREHENSIVE

## 2023-12-04 MED ORDER — NITROFURANTOIN MONOHYD MACRO 100 MG PO CAPS: 100.0000 mg | ORAL_CAPSULE | Freq: Two times a day (BID) | ORAL | 0 refills | Status: DC

## 2023-12-06 MED ORDER — PENICILLIN V POTASSIUM 500 MG PO TABS: 500.0000 mg | ORAL_TABLET | Freq: Three times a day (TID) | ORAL | 0 refills | Status: AC

## 2023-12-06 NOTE — Addendum Note (Signed)
 Addended by: Kimora Stankovic on: 12/06/2023 01:35 PM   Modules accepted: Orders

## 2023-12-08 ENCOUNTER — Telehealth: Payer: Self-pay | Admitting: Internal Medicine

## 2023-12-08 NOTE — Telephone Encounter (Signed)
Patient's son, [place], lvm stating the penicillin has made her feet swell. Not sure what to give her for her uti. I forwarded vm to Alyssa. She will consult with dfk, then let me know. I notified George-Toni

## 2023-12-12 ENCOUNTER — Ambulatory Visit (INDEPENDENT_AMBULATORY_CARE_PROVIDER_SITE_OTHER): Payer: Medicare Other | Admitting: Internal Medicine

## 2023-12-12 ENCOUNTER — Telehealth: Payer: Self-pay | Admitting: Internal Medicine

## 2023-12-12 ENCOUNTER — Other Ambulatory Visit: Payer: Self-pay

## 2023-12-12 ENCOUNTER — Encounter: Payer: Self-pay | Admitting: Internal Medicine

## 2023-12-12 VITALS — BP 121/70 | HR 97 | Temp 98.5°F | Resp 16 | Ht 61.0 in | Wt 107.0 lb

## 2023-12-12 DIAGNOSIS — J9611 Chronic respiratory failure with hypoxia: Secondary | ICD-10-CM

## 2023-12-12 DIAGNOSIS — N390 Urinary tract infection, site not specified: Secondary | ICD-10-CM

## 2023-12-12 DIAGNOSIS — J4489 Other specified chronic obstructive pulmonary disease: Secondary | ICD-10-CM

## 2023-12-12 DIAGNOSIS — J9612 Chronic respiratory failure with hypercapnia: Secondary | ICD-10-CM | POA: Diagnosis not present

## 2023-12-12 DIAGNOSIS — F411 Generalized anxiety disorder: Secondary | ICD-10-CM | POA: Diagnosis not present

## 2023-12-12 DIAGNOSIS — G479 Sleep disorder, unspecified: Secondary | ICD-10-CM

## 2023-12-12 DIAGNOSIS — R0902 Hypoxemia: Secondary | ICD-10-CM

## 2023-12-12 DIAGNOSIS — A310 Pulmonary mycobacterial infection: Secondary | ICD-10-CM

## 2023-12-12 DIAGNOSIS — J479 Bronchiectasis, uncomplicated: Secondary | ICD-10-CM

## 2023-12-12 MED ORDER — IPRATROPIUM-ALBUTEROL 0.5-2.5 (3) MG/3ML IN SOLN: 3.0000 mL | RESPIRATORY_TRACT | 3 refills | Status: DC | PRN

## 2023-12-12 NOTE — Progress Notes (Unsigned)
[facility] [address] [place], [place] [address]  Internal MEDICINE  Office Visit Note  Patient Name: Breanna Christian  [id number]  [id number]  Date of Service: 12/12/2023  Chief Complaint  Patient presents with  . Follow-up  . Anxiety    HPI     Current Medication: Outpatient Encounter Medications as of 12/12/2023  Medication Sig  . albuterol (VENTOLIN HFA) 108 (90 Base) MCG/ACT inhaler Inhale 2 puffs into the lungs every 6 (six) hours as needed for wheezing or shortness of breath.  . ALPRAZolam (XANAX) 0.25 MG tablet Take 0.5-1 tablets (0.125-0.25 mg total) by mouth daily as needed for anxiety or sleep.  Marland Kitchen ARTIFICIAL TEAR OP Place 1 drop into both eyes every 6 (six) hours as needed (dry eyes). A product from [place].  Marland Kitchen azithromycin (ZITHROMAX) 250 MG tablet Take 1 tablet (250 mg total) by mouth daily.  . Budeson-Glycopyrrol-Formoterol (BREZTRI AEROSPHERE) 160-9-4.8 MCG/ACT AERO Inhale 2 puffs into the lungs 2 (two) times daily.  . clotrimazole-betamethasone (LOTRISONE) cream APPLY TO AFFECTED AREA TWICE A DAY  . ethambutol (MYAMBUTOL) 400 MG tablet Take 1 tablet (400 mg total) by mouth daily.  Marland Kitchen ibandronate (BONIVA) 150 MG tablet Take 1 tablet (150 mg total) by mouth every 30 (thirty) days. Take in the morning with a full glass of water, on an empty stomach, and do not take anything else by mouth or lie down for the next 30 min.  Marland Kitchen ibuprofen (ADVIL,MOTRIN) 200 MG tablet Take 200 mg by mouth as needed.  Marland Kitchen ipratropium-albuterol (DUONEB) 0.5-2.5 (3) MG/3ML SOLN Take 3 mLs by nebulization every 2 (two) hours as needed. DX: J44.9  . Lifitegrast 5 % SOLN Apply 1 drop to eye in the morning and at bedtime. 1 Gtt both eyes twice a day  . Multiple Vitamin (MULTI-VITAMINS) TABS Take 1 tablet by mouth daily. Reported on 04/25/2016  . Omega-3 Fatty Acids (OMEGA-3 FISH OIL) 300 MG CAPS Take by mouth daily.  . penicillin v potassium (VEETID) 500 MG tablet Take 1 tablet (500 mg  total) by mouth 3 (three) times daily for 7 days.  . rifampin (RIFADIN) 150 MG capsule TAKE 1 CAPSULE EVERY DAY  . sodium chloride HYPERTONIC 3 % nebulizer solution Take by nebulization as needed for other.   No facility-administered encounter medications on file as of 12/12/2023.    Surgical History: Past Surgical History:  Procedure Laterality Date  . APPENDECTOMY    . BRONCHOSCOPY  02/2016  . CATARACT EXTRACTION W/PHACO Right 10/26/2016   Procedure: CATARACT EXTRACTION PHACO AND INTRAOCULAR LENS PLACEMENT (IOC);  Surgeon: Sallee Lange, MD;  Location: [facility] ORS;  Service: Ophthalmology;  Laterality: Right;  [place] 2.17 AP% 26.5 CDE 56.86 Fluid pack lot # [id number]  . FLEXIBLE BRONCHOSCOPY Bilateral 03/16/2016   Procedure: FLEXIBLE BRONCHOSCOPY;  Surgeon: Yevonne Pax, MD;  Location: [facility] OR[facility];  Service: Pulmonary;  Laterality: Bilateral;  . FLEXIBLE BRONCHOSCOPY N/A 03/03/2017   Procedure: FLEXIBLE BRONCHOSCOPY;  Surgeon: Yevonne Pax, MD;  Location: [facility] ORS;  Service: Pulmonary;  Laterality: N/A;  . TUBAL LIGATION      Medical History: Past Medical History:  Diagnosis Date  . Bronchiectasis (HCC) 2011  . COPD (chronic obstructive pulmonary disease) (HCC)   . HOH (hard of hearing)   . Hypertension   . Lung nodules   . Mycobacterium avium complex colonization   . Non-Hodgkin lymphoma (HCC) 2003  . Personal history of chemotherapy   . Pneumonia    FREQUENT IN PAST  H/O MYCOBACTERIAL  . Rheumatoid arthritis (HCC)   . Sjogren's syndrome (HCC)   . Xerostomia     Family History: Family History  Problem Relation Age of Onset  . Cancer Sister   . Breast cancer Neg Hx     Social History   Socioeconomic History  . Marital status: Married    Spouse name: Not on file  . Number of children: Not on file  . Years of education: Not on file  . Highest education level: Not on file  Occupational History  . Not on file  Tobacco Use  . Smoking status: Never  . Smokeless  tobacco: Never  Vaping Use  . Vaping status: Never Used  Substance and Sexual Activity  . Alcohol use: No  . Drug use: No  . Sexual activity: Not on file  Other Topics Concern  . Not on file  Social History Narrative  . Not on file   Social Drivers of Health   Financial Resource Strain: Not on file  Food Insecurity: Not on file  Transportation Needs: Not on file  Physical Activity: Not on file  Stress: Not on file  Social Connections: Not on file  Intimate Partner Violence: Not on file      Review of Systems  Vital Signs: BP 121/70   Pulse 97   Temp 98.5 F (36.9 C)   Resp 16   Ht 5\' 1"  (1.549 m)   Wt 107 lb (48.5 kg)   SpO2 97%   BMI 20.22 kg/m    Physical Exam     Assessment/Plan:   General Counseling: [place] verbalizes understanding of the findings of todays visit and agrees with plan of treatment. I have discussed any further diagnostic evaluation that may be needed or ordered today. We also reviewed her medications today. she has been encouraged to call the office with any questions or concerns that should arise related to todays visit.    No orders of the defined types were placed in this encounter.   No orders of the defined types were placed in this encounter.   Total time spent:*** Minutes Time spent includes review of chart, medications, test results, and follow up plan with the patient.    Controlled Substance Database was reviewed by me.   Dr Lyndon Code Internal medicine

## 2023-12-12 NOTE — Telephone Encounter (Signed)
Left 2 vms for special's department @ [facility] to schedule ABS-Toni

## 2023-12-13 ENCOUNTER — Other Ambulatory Visit: Payer: Self-pay | Admitting: Nurse Practitioner

## 2023-12-13 ENCOUNTER — Other Ambulatory Visit: Payer: Self-pay | Admitting: Internal Medicine

## 2023-12-13 ENCOUNTER — Telehealth: Payer: Self-pay | Admitting: Internal Medicine

## 2023-12-13 DIAGNOSIS — R0902 Hypoxemia: Secondary | ICD-10-CM

## 2023-12-13 DIAGNOSIS — R0602 Shortness of breath: Secondary | ICD-10-CM

## 2023-12-13 DIAGNOSIS — G479 Sleep disorder, unspecified: Secondary | ICD-10-CM

## 2023-12-13 DIAGNOSIS — F064 Anxiety disorder due to known physiological condition: Secondary | ICD-10-CM

## 2023-12-13 NOTE — Telephone Encounter (Signed)
Notified son, Greggory Stallion, of ABG appointment date, arrival time, location -Sheralyn Boatman

## 2023-12-13 NOTE — Telephone Encounter (Signed)
I cannot send RX yet, epic is coming in am

## 2023-12-14 LAB — UA/M W/RFLX CULTURE, ROUTINE
Bilirubin, UA: NEGATIVE
Glucose, UA: NEGATIVE
Ketones, UA: NEGATIVE
Nitrite, UA: NEGATIVE
Protein,UA: NEGATIVE
RBC, UA: NEGATIVE
Specific Gravity, UA: 1.016 (ref 1.005–1.030)
Urobilinogen, Ur: 1 mg/dL (ref 0.2–1.0)
pH, UA: 6.5 (ref 5.0–7.5)

## 2023-12-14 LAB — MICROSCOPIC EXAMINATION
Bacteria, UA: NONE SEEN
Casts: NONE SEEN /LPF
RBC, Urine: NONE SEEN /HPF (ref 0–2)

## 2023-12-14 LAB — URINE CULTURE, REFLEX

## 2023-12-14 MED ORDER — ALPRAZOLAM 0.25 MG PO TABS: ORAL_TABLET | ORAL | 1 refills | Status: DC

## 2023-12-15 ENCOUNTER — Ambulatory Visit: Payer: Medicare Other

## 2023-12-15 ENCOUNTER — Ambulatory Visit: Payer: Medicare Other | Attending: Internal Medicine

## 2023-12-15 DIAGNOSIS — R0602 Shortness of breath: Secondary | ICD-10-CM | POA: Diagnosis not present

## 2023-12-15 LAB — BLOOD GAS, ARTERIAL
Acid-Base Excess: 12.7 mmol/L — ABNORMAL HIGH (ref 0.0–2.0)
Bicarbonate: 39.4 mmol/L — ABNORMAL HIGH (ref 20.0–28.0)
FIO2: 0.32 %
O2 Saturation: 96.1 %
Patient temperature: 37
pCO2 arterial: 58 mm[Hg] — ABNORMAL HIGH (ref 32–48)
pH, Arterial: 7.44 (ref 7.35–7.45)
pO2, Arterial: 70 mm[Hg] — ABNORMAL LOW (ref 83–108)

## 2023-12-25 ENCOUNTER — Telehealth: Payer: Self-pay | Admitting: Internal Medicine

## 2023-12-25 ENCOUNTER — Ambulatory Visit: Payer: Medicare Other | Admitting: Internal Medicine

## 2023-12-25 ENCOUNTER — Encounter: Payer: Self-pay | Admitting: Internal Medicine

## 2023-12-25 VITALS — BP 110/60 | HR 92 | Temp 97.8°F | Resp 16 | Ht 61.0 in | Wt 107.0 lb

## 2023-12-25 DIAGNOSIS — J4489 Other specified chronic obstructive pulmonary disease: Secondary | ICD-10-CM

## 2023-12-25 DIAGNOSIS — J9611 Chronic respiratory failure with hypoxia: Secondary | ICD-10-CM | POA: Diagnosis not present

## 2023-12-25 DIAGNOSIS — J9612 Chronic respiratory failure with hypercapnia: Secondary | ICD-10-CM

## 2023-12-25 DIAGNOSIS — A310 Pulmonary mycobacterial infection: Secondary | ICD-10-CM

## 2023-12-25 NOTE — Telephone Encounter (Signed)
Lvm for son to return my call to let me know what dme company they use for patient's cpap/bipap-Breanna Christian

## 2023-12-25 NOTE — Progress Notes (Signed)
[facility] [address] [place], [place] [address]  Pulmonary Sleep Medicine   Office Visit Note  Patient Name: Breanna Christian DOB: [birth year] MRN [id number]  Date of Service: 12/25/2023  Complaints/HPI: she is comfortable has had a steady decline but is using hre meds as precribed. She has noted cough and also has noted some congestion Her ABG had shows presence of elevated pCO2 consistent with hypercapneic respiratory failure. Patient has no fevers noted. Denies having chest pain noted. She is not 100% compliance with oxygen and discussed with her and her family at bedside.  Office Spirometry Results:     ROS  General: (-) fever, (-) chills, (-) night sweats, (-) weakness Skin: (-) rashes, (-) itching,. Eyes: (-) visual changes, (-) redness, (-) itching. Nose and Sinuses: (-) nasal stuffiness or itchiness, (-) postnasal drip, (-) nosebleeds, (-) sinus trouble. Mouth and Throat: (-) sore throat, (-) hoarseness. Neck: (-) swollen glands, (-) enlarged thyroid, (-) neck pain. Respiratory: + cough, (-) bloody sputum, + shortness of breath, + wheezing. Cardiovascular: + ankle swelling, (-) chest pain. Lymphatic: (-) lymph node enlargement. Neurologic: (-) numbness, (-) tingling. Psychiatric: (-) anxiety, (-) depression   Current Medication: Outpatient Encounter Medications as of 12/25/2023  Medication Sig   albuterol (VENTOLIN HFA) 108 (90 Base) MCG/ACT inhaler Inhale 2 puffs into the lungs every 6 (six) hours as needed for wheezing or shortness of breath.   ALPRAZolam (XANAX) 0.25 MG tablet Take 1/2 tablet by mouth in the morning and 1 tablet at bed time.   ARTIFICIAL TEAR OP Place 1 drop into both eyes every 6 (six) hours as needed (dry eyes). A product from [place].   azithromycin (ZITHROMAX) 250 MG tablet Take 1 tablet (250 mg total) by mouth daily.   Budeson-Glycopyrrol-Formoterol (BREZTRI AEROSPHERE) 160-9-4.8 MCG/ACT AERO Inhale 2 puffs into the lungs 2 (two)  times daily.   clotrimazole-betamethasone (LOTRISONE) cream APPLY TO AFFECTED AREA TWICE A DAY   ethambutol (MYAMBUTOL) 400 MG tablet Take 1 tablet (400 mg total) by mouth daily.   ibandronate (BONIVA) 150 MG tablet Take 1 tablet (150 mg total) by mouth every 30 (thirty) days. Take in the morning with a full glass of water, on an empty stomach, and do not take anything else by mouth or lie down for the next 30 min.   ibuprofen (ADVIL,MOTRIN) 200 MG tablet Take 200 mg by mouth as needed.   ipratropium-albuterol (DUONEB) 0.5-2.5 (3) MG/3ML SOLN Take 3 mLs by nebulization every 2 (two) hours as needed. DX: J44.9   Lifitegrast 5 % SOLN Apply 1 drop to eye in the morning and at bedtime. 1 Gtt both eyes twice a day   Multiple Vitamin (MULTI-VITAMINS) TABS Take 1 tablet by mouth daily. Reported on 04/25/2016   Omega-3 Fatty Acids (OMEGA-3 FISH OIL) 300 MG CAPS Take by mouth daily.   rifampin (RIFADIN) 150 MG capsule TAKE 1 CAPSULE EVERY DAY   sodium chloride HYPERTONIC 3 % nebulizer solution Take by nebulization as needed for other.   No facility-administered encounter medications on file as of 12/25/2023.    Surgical History: Past Surgical History:  Procedure Laterality Date   APPENDECTOMY     BRONCHOSCOPY  02/2016   CATARACT EXTRACTION W/PHACO Right 10/26/2016   Procedure: CATARACT EXTRACTION PHACO AND INTRAOCULAR LENS PLACEMENT (IOC);  Surgeon: Sallee Lange, MD;  Location: [facility];  Service: Ophthalmology;  Laterality: Right;  [place] 2.17 AP% 26.5 CDE 56.86 Fluid pack lot # [id number]   FLEXIBLE BRONCHOSCOPY Bilateral 03/16/2016  Procedure: FLEXIBLE BRONCHOSCOPY;  Surgeon: Yevonne Pax, MD;  Location: [facility];  Service: Pulmonary;  Laterality: Bilateral;   FLEXIBLE BRONCHOSCOPY N/A 03/03/2017   Procedure: FLEXIBLE BRONCHOSCOPY;  Surgeon: Yevonne Pax, MD;  Location: [facility];  Service: Pulmonary;  Laterality: N/A;   TUBAL LIGATION      Medical History: Past Medical History:  Diagnosis Date    Bronchiectasis (HCC) 2011   COPD (chronic obstructive pulmonary disease) (HCC)    HOH (hard of hearing)    Hypertension    Lung nodules    Mycobacterium avium complex colonization    Non-Hodgkin lymphoma (HCC) 2003   Personal history of chemotherapy    Pneumonia    FREQUENT IN PAST    H/O MYCOBACTERIAL   Rheumatoid arthritis (HCC)    Sjogren's syndrome (HCC)    Xerostomia     Family History: Family History  Problem Relation Age of Onset   Cancer Sister    Breast cancer Neg Hx     Social History: Social History   Socioeconomic History   Marital status: Married    Spouse name: Not on file   Number of children: Not on file   Years of education: Not on file   Highest education level: Not on file  Occupational History   Not on file  Tobacco Use   Smoking status: Never   Smokeless tobacco: Never  Vaping Use   Vaping status: Never Used  Substance and Sexual Activity   Alcohol use: No   Drug use: No   Sexual activity: Not on file  Other Topics Concern   Not on file  Social History Narrative   Not on file   Social Drivers of Health   Financial Resource Strain: Not on file  Food Insecurity: Not on file  Transportation Needs: Not on file  Physical Activity: Not on file  Stress: Not on file  Social Connections: Not on file  Intimate Partner Violence: Not on file    Vital Signs: Blood pressure 110/60, pulse 92, temperature 97.8 F (36.6 C), resp. rate 16, height 5\' 1"  (1.549 m), weight 107 lb (48.5 kg), SpO2 94%.  Examination: General Appearance: The patient is well-developed, well-nourished, and in no distress. Skin: Gross inspection of skin unremarkable. Head: normocephalic, no gross deformities. Eyes: no gross deformities noted. ENT: ears appear grossly normal no exudates. Neck: Supple. No thyromegaly. No LAD. Respiratory: few rhonchi noted. Cardiovascular: Normal S1 and S2 without murmur or rub. Extremities: No cyanosis. pulses are equal. Neurologic:  Alert and oriented. No involuntary movements.  LABS: Recent Results (from the past 2160 hours)  UA/M w/rflx Culture, Routine     Status: Abnormal   Collection Time: 10/31/23  3:59 PM   Specimen: Urine   Urine  Result Value Ref Range   Specific Gravity, UA 1.015 1.005 - 1.030   pH, UA 6.5 5.0 - 7.5   Color, UA Yellow Yellow   Appearance Ur Clear Clear   Leukocytes,UA 1+ (A) Negative   Protein,UA Negative Negative/Trace   Glucose, UA Negative Negative   Ketones, UA Negative Negative   RBC, UA Negative Negative   Bilirubin, UA Negative Negative   Urobilinogen, Ur 1.0 0.2 - 1.0 mg/dL   Nitrite, UA Negative Negative   Microscopic Examination See below:     Comment: Microscopic was indicated and was performed.   Urinalysis Reflex Comment     Comment: This specimen has reflexed to a Urine Culture.  Microscopic Examination     Status: Abnormal  Collection Time: 10/31/23  3:59 PM   Urine  Result Value Ref Range   WBC, UA 6-10 (A) 0 - 5 /hpf   RBC, Urine 11-30 (A) 0 - 2 /hpf   Epithelial Cells (non renal) 0-10 0 - 10 /hpf   Casts None seen None seen /lpf   Bacteria, UA Many (A) None seen/Few  Urine Culture, Reflex     Status: Abnormal   Collection Time: 10/31/23  3:59 PM   Urine  Result Value Ref Range   Urine Culture, Routine Final report (A)    Organism ID, Bacteria Enterococcus faecalis (A)     Comment: For Enterococcus species, aminoglycosides (except for high-level resistance screening), cephalosporins, clindamycin, and trimethoprim-sulfamethoxazole are not effective clinically. (CLSI, M100-S26, 2016) 50,000-100,000 colony forming units per mL    ORGANISM ID, BACTERIA Comment     Comment: Mixed urogenital flora Less than 10,000 colonies/mL    Antimicrobial Susceptibility Comment     Comment:       ** S = Susceptible; I = Intermediate; R = Resistant **                    P = Positive; N = Negative             MICS are expressed in micrograms per mL    Antibiotic                  RSLT#1    RSLT#2    RSLT#3    RSLT#4 Ciprofloxacin                  R Levofloxacin                   R Nitrofurantoin                 S Penicillin                     S Tetracycline                   R Vancomycin                     S   POCT Urinalysis Dipstick     Status: None   Collection Time: 11/27/23  3:11 PM  Result Value Ref Range   Color, UA     Clarity, UA     Glucose, UA Negative Negative   Bilirubin, UA negative    Ketones, UA trace    Spec Grav, UA 1.020 1.010 - 1.025   Blood, UA moderate    pH, UA 5.0 5.0 - 8.0   Protein, UA Negative Negative   Urobilinogen, UA 0.2 0.2 or 1.0 E.U./dL   Nitrite, UA negative    Leukocytes, UA Negative Negative   Appearance     Odor    CULTURE, URINE COMPREHENSIVE     Status: Abnormal   Collection Time: 11/27/23  3:49 PM   Specimen: Urine   Urine  Result Value Ref Range   Urine Culture, Comprehensive Final report (A)    Organism ID, Bacteria Enterococcus faecalis (A)     Comment: For Enterococcus species, aminoglycosides (except for high-level resistance screening), cephalosporins, clindamycin, and trimethoprim-sulfamethoxazole are not effective clinically. (CLSI, M100-S26, 2016) 25,000-50,000 colony forming units per mL    ANTIMICROBIAL SUSCEPTIBILITY Comment     Comment:       ** S = Susceptible; I = Intermediate; R = Resistant **  P = Positive; N = Negative             MICS are expressed in micrograms per mL    Antibiotic                 RSLT#1    RSLT#2    RSLT#3    RSLT#4 Ciprofloxacin                  R Levofloxacin                   R Nitrofurantoin                 S Penicillin                     S Tetracycline                   R Vancomycin                     S   UA/M w/rflx Culture, Routine     Status: Abnormal   Collection Time: 12/12/23  3:23 PM   Specimen: Urine   Urine  Result Value Ref Range   Specific Gravity, UA 1.016 1.005 - 1.030   pH, UA 6.5 5.0 - 7.5   Color, UA  Yellow Yellow   Appearance Ur Clear Clear   Leukocytes,UA 1+ (A) Negative   Protein,UA Negative Negative/Trace   Glucose, UA Negative Negative   Ketones, UA Negative Negative   RBC, UA Negative Negative   Bilirubin, UA Negative Negative   Urobilinogen, Ur 1.0 0.2 - 1.0 mg/dL   Nitrite, UA Negative Negative   Microscopic Examination See below:     Comment: Microscopic was indicated and was performed.   Urinalysis Reflex Comment     Comment: This specimen has reflexed to a Urine Culture.  Microscopic Examination     Status: None   Collection Time: 12/12/23  3:23 PM   Urine  Result Value Ref Range   WBC, UA 0-5 0 - 5 /hpf   RBC, Urine None seen 0 - 2 /hpf   Epithelial Cells (non renal) 0-10 0 - 10 /hpf   Casts None seen None seen /lpf   Bacteria, UA None seen None seen/Few  Urine Culture, Reflex     Status: None   Collection Time: 12/12/23  3:23 PM   Urine  Result Value Ref Range   Urine Culture, Routine Final report    Organism ID, Bacteria Comment     Comment: Culture shows less than 10,000 colony forming units of bacteria per milliliter of urine. This colony count is not generally considered to be clinically significant.   Blood gas, arterial     Status: Abnormal   Collection Time: 12/15/23 11:27 AM  Result Value Ref Range   FIO2 0.32 %   Delivery systems NASAL CANNULA    pH, Arterial 7.44 7.35 - 7.45   pCO2 arterial 58 (H) 32 - 48 mmHg   pO2, Arterial 70 (L) 83 - 108 mmHg   Bicarbonate 39.4 (H) 20.0 - 28.0 mmol/L   Acid-Base Excess 12.7 (H) 0.0 - 2.0 mmol/L   O2 Saturation 96.1 %   Patient temperature 37.0    Collection site RIGHT BRACHIAL    Allens test (pass/fail) PASS PASS    Comment: Performed at [facility], [address], [place], [place] [address]    Radiology: CT CHEST WO CONTRAST Result  Date: 11/28/2023 CLINICAL DATA:  Chronic cough.  Failed enteric treatment. EXAM: CT CHEST WITHOUT CONTRAST TECHNIQUE: Multidetector CT imaging of the chest was  performed following the standard protocol without IV contrast. RADIATION DOSE REDUCTION: This exam was performed according to the departmental dose-optimization program which includes automated exposure control, adjustment of the mA and/or kV according to patient size and/or use of iterative reconstruction technique. COMPARISON:  Chest CT dated 09/01/2023. FINDINGS: Evaluation of this exam is limited in the absence of intravenous contrast. Cardiovascular: There is no cardiomegaly or pericardial effusion. There is coronary vascular calcification and calcification of the mitral annulus. Mild atherosclerotic calcification of the thoracic aorta. No aneurysmal dilatation. The central pulmonary arteries are grossly unremarkable on this noncontrast CT. Mediastinum/Nodes: No obvious hilar adenopathy. Evaluation however is limited in the absence of intravenous contrast and due to consolidative changes of the right middle lobe. Mildly enlarged mediastinal lymph node anterior to the carina measures 16 mm in short axis. The esophagus is grossly unremarkable as visualized. No mediastinal fluid collection. Lungs/Pleura: Chronic complete consolidation of the right middle lobe with bronchiectasis and air bronchogram. There is mild bilateral lower lobe predominant bronchiectasis. Scattered bilateral pulmonary nodules slightly progressed since the prior CT most consistent with pneumonia, likely atypical etiology such as MAI. There is a small right pleural effusion slightly increased since the prior CT. No pneumothorax. The central airways are patent. Upper Abdomen: No acute abnormality. Musculoskeletal: Osteopenia with degenerative changes. No acute osseous pathology. IMPRESSION: 1. Progression of bilateral pulmonary nodules most consistent with atypical pneumonia. 2. Chronic complete consolidation of the right middle lobe with bronchiectasis and air bronchogram. 3. Small right pleural effusion slightly increased since the prior CT.  4.  Aortic Atherosclerosis (ICD10-I70.0). Electronically Signed   By: Elgie Collard M.D.   On: 11/28/2023 13:50    No results found.  CT CHEST WO CONTRAST Result Date: 11/28/2023 CLINICAL DATA:  Chronic cough.  Failed enteric treatment. EXAM: CT CHEST WITHOUT CONTRAST TECHNIQUE: Multidetector CT imaging of the chest was performed following the standard protocol without IV contrast. RADIATION DOSE REDUCTION: This exam was performed according to the departmental dose-optimization program which includes automated exposure control, adjustment of the mA and/or kV according to patient size and/or use of iterative reconstruction technique. COMPARISON:  Chest CT dated 09/01/2023. FINDINGS: Evaluation of this exam is limited in the absence of intravenous contrast. Cardiovascular: There is no cardiomegaly or pericardial effusion. There is coronary vascular calcification and calcification of the mitral annulus. Mild atherosclerotic calcification of the thoracic aorta. No aneurysmal dilatation. The central pulmonary arteries are grossly unremarkable on this noncontrast CT. Mediastinum/Nodes: No obvious hilar adenopathy. Evaluation however is limited in the absence of intravenous contrast and due to consolidative changes of the right middle lobe. Mildly enlarged mediastinal lymph node anterior to the carina measures 16 mm in short axis. The esophagus is grossly unremarkable as visualized. No mediastinal fluid collection. Lungs/Pleura: Chronic complete consolidation of the right middle lobe with bronchiectasis and air bronchogram. There is mild bilateral lower lobe predominant bronchiectasis. Scattered bilateral pulmonary nodules slightly progressed since the prior CT most consistent with pneumonia, likely atypical etiology such as MAI. There is a small right pleural effusion slightly increased since the prior CT. No pneumothorax. The central airways are patent. Upper Abdomen: No acute abnormality. Musculoskeletal:  Osteopenia with degenerative changes. No acute osseous pathology. IMPRESSION: 1. Progression of bilateral pulmonary nodules most consistent with atypical pneumonia. 2. Chronic complete consolidation of the right middle lobe with bronchiectasis and air  bronchogram. 3. Small right pleural effusion slightly increased since the prior CT. 4.  Aortic Atherosclerosis (ICD10-I70.0). Electronically Signed   By: Elgie Collard M.D.   On: 11/28/2023 13:50    Assessment and Plan: Patient Active Problem List   Diagnosis Date Noted   Chronic respiratory failure with hypoxia (HCC) 11/28/2023   Encounter for general adult medical examination with abnormal findings 08/08/2020   Other fatigue 08/08/2020   Need for vaccination against Streptococcus pneumoniae using pneumococcal conjugate vaccine 7 08/08/2020   Age related osteoporosis 10/15/2018   Ovarian failure 10/15/2018   Screening for breast cancer 04/29/2018   Essential hypertension 04/29/2018   Obstructive chronic bronchitis without exacerbation (HCC) 04/29/2018   MAI (mycobacterium avium-intracellulare) infection (HCC) 02/08/2018   Numbness and tingling 03/25/2015   Myopathy 02/19/2015   Polyneuropathy 02/18/2015   Weakness of both lower extremities 02/18/2015    1. Chronic respiratory failure with hypoxia and hypercapnia (HCC) (Primary) Patient has life threatening respiratory failure due to COPD. Patient has had an ABG documenting high pCO2 level and has had PFT revealing severe obstructive ventilatory defect. Based on these findings patient would benefit from NON-INVASIVE VENTILATION. Last ABG results as follows pCO2 58. BIPAP and CPAP have been considered and ruled out as patient will not be able to tolerate. Without NON-INVASIVE VENTILATION patient is at high risk of continued decline with worsening of respiratory failure as well as worsening of pCO2 and risk for frequent hospitalizations. A BiLEVEL device will be inadequate to support the  nocturnal ventilation needs and control the patients symptoms. The NON-INVASIVE VENTILATOR is the best modality for this patient with COPD and respiratory failure and may aid in the patients exercise tolerance as well as help to reduce frequency of exacerbations. The patient is able to clear their own airway of secretions and protect the airway. The NON-INVASIVE VENTILATOR may also help with reducing risk for aspiration and also reducing the need for long term mechanical ventilation due to respiratory failure.  - Bipap; Future - Ambulatory referral to Hospice - Basic Metabolic Panel (BMET) - CBC with Differential/Platelet  2. Obstructive chronic bronchitis without exacerbation (HCC) Severe disease will get her on a trelegy ventilator as decribed above  3. MAI (mycobacterium avium-intracellulare) infection (HCC) On treatment needs to be continued to prevent further decline    General Counseling: I have discussed the findings of the evaluation and examination with Chanci.  I have also discussed any further diagnostic evaluation thatmay be needed or ordered today. Brittanny verbalizes understanding of the findings of todays visit. We also reviewed her medications today and discussed drug interactions and side effects including but not limited excessive drowsiness and altered mental states. We also discussed that there is always a risk not just to her but also people around her. she has been encouraged to call the office with any questions or concerns that should arise related to todays visit.  No orders of the defined types were placed in this encounter.    Time spent: 42  I have personally obtained a history, examined the patient, evaluated laboratory and imaging results, formulated the assessment and plan and placed orders.    Yevonne Pax, MD [facility] Pulmonary and Critical Care Sleep medicine

## 2023-12-26 ENCOUNTER — Encounter: Payer: Self-pay | Admitting: Internal Medicine

## 2023-12-26 ENCOUNTER — Other Ambulatory Visit: Payer: Self-pay | Admitting: Internal Medicine

## 2023-12-26 DIAGNOSIS — F064 Anxiety disorder due to known physiological condition: Secondary | ICD-10-CM

## 2023-12-26 DIAGNOSIS — G479 Sleep disorder, unspecified: Secondary | ICD-10-CM

## 2023-12-26 MED ORDER — ALPRAZOLAM 0.25 MG PO TABS: ORAL_TABLET | ORAL | 1 refills | Status: DC

## 2023-12-26 MED ORDER — MIRTAZAPINE 7.5 MG PO TABS: 7.5000 mg | ORAL_TABLET | Freq: Every day | ORAL | 3 refills | Status: DC

## 2023-12-26 NOTE — Telephone Encounter (Signed)
 rx

## 2024-01-01 ENCOUNTER — Other Ambulatory Visit: Payer: Self-pay | Admitting: Nurse Practitioner

## 2024-01-01 DIAGNOSIS — A310 Pulmonary mycobacterial infection: Secondary | ICD-10-CM

## 2024-01-02 ENCOUNTER — Other Ambulatory Visit: Payer: Self-pay

## 2024-01-02 ENCOUNTER — Other Ambulatory Visit: Payer: Self-pay | Admitting: Internal Medicine

## 2024-01-02 DIAGNOSIS — H16223 Keratoconjunctivitis sicca, not specified as Sjogren's, bilateral: Secondary | ICD-10-CM | POA: Diagnosis not present

## 2024-01-02 DIAGNOSIS — H16143 Punctate keratitis, bilateral: Secondary | ICD-10-CM | POA: Diagnosis not present

## 2024-01-02 MED ORDER — ALPRAZOLAM 0.5 MG PO TABS: ORAL_TABLET | ORAL | 3 refills | Status: DC

## 2024-01-08 ENCOUNTER — Telehealth: Payer: Self-pay | Admitting: Internal Medicine

## 2024-01-08 NOTE — Telephone Encounter (Signed)
Non-invasive ventilator order faxed to [facility]. Scanned-Toni

## 2024-01-15 ENCOUNTER — Telehealth: Payer: Self-pay

## 2024-01-15 NOTE — Telephone Encounter (Signed)
 Completed P.A. for patient's Duoneb.

## 2024-01-18 ENCOUNTER — Ambulatory Visit: Payer: Medicare Other | Attending: Cardiovascular Disease | Admitting: Cardiovascular Disease

## 2024-01-18 ENCOUNTER — Encounter: Payer: Self-pay | Admitting: Cardiovascular Disease

## 2024-01-18 VITALS — BP 100/56 | HR 120 | Ht 62.0 in | Wt 105.0 lb

## 2024-01-18 DIAGNOSIS — R011 Cardiac murmur, unspecified: Secondary | ICD-10-CM | POA: Diagnosis not present

## 2024-01-18 DIAGNOSIS — R0600 Dyspnea, unspecified: Secondary | ICD-10-CM | POA: Insufficient documentation

## 2024-01-18 NOTE — Patient Instructions (Signed)
 Medication Instructions:  No changes *If you need a refill on your cardiac medications before your next appointment, please call your pharmacy*   Lab Work: None ordered If you have labs (blood work) drawn today and your tests are completely normal, you will receive your results only by: MyChart Message (if you have MyChart) OR A paper copy in the mail If you have any lab test that is abnormal or we need to change your treatment, we will call you to review the results.   Testing/Procedures: Your physician has requested that you have an echocardiogram. Echocardiography is a painless test that uses sound waves to create images of your heart. It provides your doctor with information about the size and shape of your heart and how well your heart's chambers and valves are working.   You may receive an ultrasound enhancing agent through an IV if needed to better visualize your heart during the echo. This procedure takes approximately one hour.  There are no restrictions for this procedure.  This will take place at [address] [facility]) [address], [place] [address]  Please note: We ask at that you not bring children with you during ultrasound (echo/ vascular) testing. Due to room size and safety concerns, children are not allowed in the ultrasound rooms during exams. Our front office staff cannot provide observation of children in our lobby area while testing is being conducted. An adult accompanying a patient to their appointment will only be allowed in the ultrasound room at the discretion of the ultrasound technician under special circumstances. We apologize for any inconvenience.    Follow-Up: At [facility], you and your health needs are our priority.  As part of our continuing mission to provide you with exceptional heart care, we have created designated Provider Care Teams.  These Care Teams include your primary Cardiologist (physician) and Advanced Practice  Providers (APPs -  Physician Assistants and Nurse Practitioners) who all work together to provide you with the care you need, when you need it.  We recommend signing up for the patient portal called "MyChart".  Sign up information is provided on this After Visit Summary.  MyChart is used to connect with patients for Virtual Visits (Telemedicine).  Patients are able to view lab/test results, encounter notes, upcoming appointments, etc.  Non-urgent messages can be sent to your provider as well.   To learn more about what you can do with MyChart, go to [email or web address].    Your next appointment:   Follow up with Dr. Kirke Corin or app after the echo

## 2024-01-18 NOTE — Progress Notes (Signed)
 Cardiology Office Note   Date:  01/18/2024   ID:  ORLENA GARMON, DOB Feb 08, 1941, MRN [id number]  PCP:  Lyndon Code, MD  Cardiologist:   Lorine Bears, MD   Chief Complaint  Patient presents with   Shortness of Breath    The patient experiences shortness of breath due to COPD. Patient experiences swelling in her feet. Meds reviewed.       History of Present Illness: Breanna Christian is a 83 y.o. female who was referred by Dr. Welton Flakes for evaluation of a heart murmur and exertional dyspnea. She has known history of severe COPD, essential hypertension and frequent pneumonias. She had an echocardiogram done in June 2021 which showed normal LV systolic function, mild aortic stenosis and mild to moderate mitral regurgitation.  She had non-Hodgkin's lymphoma in 2021 that was treated with chemotherapy and radiation therapy.  Since that time, she developed severe lung disease.  She had extensive secondhand smoking and was also exposed to different chemicals.  She had bronchoscopy done in 2017 and was diagnosed with Mycobacterium avium complex colonization.  She has been on antibiotics since then.  She is currently on 2 L of oxygen but her respiratory status continues to deteriorate with hypoxia and hypercapnia.  She becomes tachycardic with minimal exertion.  She denies any chest pain.  She is accompanied by her son who helps with translation.  She speaks [place].  They noticed leg edema over the last 2 weeks.  Past Medical History:  Diagnosis Date   Bronchiectasis (HCC) 2011   COPD (chronic obstructive pulmonary disease) (HCC)    HOH (hard of hearing)    Hypertension    Lung nodules    Mycobacterium avium complex colonization    Non-Hodgkin lymphoma (HCC) 2003   Personal history of chemotherapy    Pneumonia    FREQUENT IN PAST    H/O MYCOBACTERIAL   Rheumatoid arthritis (HCC)    Sjogren's syndrome (HCC)    Xerostomia     Past Surgical History:  Procedure Laterality Date    APPENDECTOMY     BRONCHOSCOPY  02/2016   CATARACT EXTRACTION W/PHACO Right 10/26/2016   Procedure: CATARACT EXTRACTION PHACO AND INTRAOCULAR LENS PLACEMENT (IOC);  Surgeon: Sallee Lange, MD;  Location: [facility] ORS;  Service: Ophthalmology;  Laterality: Right;  [place] 2.17 AP% 26.5 CDE 56.86 Fluid pack lot # [id number]   FLEXIBLE BRONCHOSCOPY Bilateral 03/16/2016   Procedure: FLEXIBLE BRONCHOSCOPY;  Surgeon: Yevonne Pax, MD;  Location: [facility] ORS;  Service: Pulmonary;  Laterality: Bilateral;   FLEXIBLE BRONCHOSCOPY N/A 03/03/2017   Procedure: FLEXIBLE BRONCHOSCOPY;  Surgeon: Yevonne Pax, MD;  Location: [facility] ORS;  Service: Pulmonary;  Laterality: N/A;   TUBAL LIGATION       Current Outpatient Medications  Medication Sig Dispense Refill   albuterol (VENTOLIN HFA) 108 (90 Base) MCG/ACT inhaler Inhale 2 puffs into the lungs every 6 (six) hours as needed for wheezing or shortness of breath. 18 each 3   ALPRAZolam (XANAX) 0.5 MG tablet Take half tab in am and one at night 45 tablet 3   ARTIFICIAL TEAR OP Place 1 drop into both eyes every 6 (six) hours as needed (dry eyes). A product from [place].     azithromycin (ZITHROMAX) 250 MG tablet Take 1 tablet (250 mg total) by mouth daily. 30 tablet 0   Budeson-Glycopyrrol-Formoterol (BREZTRI AEROSPHERE) 160-9-4.8 MCG/ACT AERO Inhale 2 puffs into the lungs 2 (two) times daily. 1 each 0   clotrimazole-betamethasone (LOTRISONE) cream APPLY  TO AFFECTED AREA TWICE A DAY 30 g 0   ethambutol (MYAMBUTOL) 400 MG tablet TAKE 1 TABLET DAILY 30 tablet 2   ibandronate (BONIVA) 150 MG tablet Take 1 tablet (150 mg total) by mouth every 30 (thirty) days. Take in the morning with a full glass of water, on an empty stomach, and do not take anything else by mouth or lie down for the next 30 min. 2 tablet 1   ibuprofen (ADVIL,MOTRIN) 200 MG tablet Take 200 mg by mouth as needed.     ipratropium-albuterol (DUONEB) 0.5-2.5 (3) MG/3ML SOLN Take 3 mLs by nebulization every 2 (two)  hours as needed. DX: J44.9 360 mL 3   Lifitegrast 5 % SOLN Apply 1 drop to eye in the morning and at bedtime. 1 Gtt both eyes twice a day     mirtazapine (REMERON) 7.5 MG tablet Take 1 tablet (7.5 mg total) by mouth at bedtime. 30 tablet 3   Multiple Vitamin (MULTI-VITAMINS) TABS Take 1 tablet by mouth daily. Reported on 04/25/2016     Omega-3 Fatty Acids (OMEGA-3 FISH OIL) 300 MG CAPS Take by mouth daily.     rifampin (RIFADIN) 150 MG capsule TAKE 1 CAPSULE EVERY DAY 90 capsule 0   sodium chloride HYPERTONIC 3 % nebulizer solution Take by nebulization as needed for other. 750 mL 12   No current facility-administered medications for this visit.    Allergies:   Prednisone    Social History:  The patient  reports that she has never smoked. She has never used smokeless tobacco. She reports that she does not drink alcohol and does not use drugs.   Family History:  The patient's family history includes Cancer in her sister.    ROS:  Please see the history of present illness.   Otherwise, review of systems are positive for none.   All other systems are reviewed and negative.    PHYSICAL EXAM: VS:  BP (!) 100/56   Pulse (!) 120   Ht 5\' 2"  (1.575 m)   Wt 105 lb (47.6 kg)   SpO2 95%   BMI 19.20 kg/m  , BMI Body mass index is 19.2 kg/m. GEN: Well nourished, well developed, in no acute distress  HEENT: normal  Neck: no JVD, carotid bruits, or masses Cardiac: RRR and mildly tachycardic; no  rubs, or gallops, 2 out of 6 systolic murmur in the aortic area and 2 out of 6 holosystolic murmur at the left sternal border.  There is moderate bilateral leg edema Respiratory: Diminished breath sounds bilaterally. GI: soft, nontender, nondistended, + BS MS: no deformity or atrophy  Skin: warm and dry, no rash Neuro:  Strength and sensation are intact Psych: euthymic mood, full affect   EKG:  EKG is ordered today. The ekg ordered today demonstrates : Sinus tachycardia Possible Left atrial  enlargement Incomplete right bundle branch block When compared with ECG of 28-Sep-2010 08:50, Incomplete right bundle branch block is now Present    Recent Labs: No results found for requested labs within last 365 days.    Lipid Panel    Component Value Date/Time   CHOL 168 08/01/2022 1147   TRIG 82 08/01/2022 1147   HDL 57 08/01/2022 1147   LDLCALC 96 08/01/2022 1147      Wt Readings from Last 3 Encounters:  01/18/24 105 lb (47.6 kg)  12/25/23 107 lb (48.5 kg)  12/12/23 107 lb (48.5 kg)          01/18/2024    3:35  PM  PAD Screen  Previous PAD dx? No  Previous surgical procedure? No  Pain with walking? No  Feet/toe relief with dangling? No  Painful, non-healing ulcers? No  Extremities discolored? --      ASSESSMENT AND PLAN:  1.  Severe exertional dyspnea: Likely due to underlying COPD.  However, he does have evidence of right-sided heart failure.  Suspect pulmonary hypertension due to lung disease.  I requested an echocardiogram.  2.  Aortic stenosis and mitral regurgitation: In addition, she likely has significant tricuspid regurgitation murmur.  I requested an echocardiogram for evaluation.  She is not a candidate for any invasive cardiac evaluation given her advanced lung disease but we might be able to treat with certain medications like digoxin if she has right-sided heart failure and potentially a small dose diuretic but we have to be cautious.  3.  Sinus tachycardia: Likely due to underlying hypoxia.  Will avoid beta-blockers.   Disposition:   FU after echo  Signed,  Lorine Bears, MD  01/18/2024 5:09 PM    [facility]

## 2024-01-19 ENCOUNTER — Encounter: Payer: Self-pay | Admitting: Internal Medicine

## 2024-01-22 ENCOUNTER — Telehealth: Payer: Self-pay

## 2024-01-22 NOTE — Telephone Encounter (Signed)
 Completed appeal for patient's Duoneb.

## 2024-01-23 ENCOUNTER — Telehealth: Payer: Self-pay

## 2024-01-23 NOTE — Telephone Encounter (Signed)
 Patient's P.A. and appeal for Duoneb denied per Medicare Part D due to patient not living in a long term care facility. Drugs with DME used at home are not covered, per Medicare.

## 2024-01-25 ENCOUNTER — Telehealth: Payer: Self-pay | Admitting: Internal Medicine

## 2024-01-25 NOTE — Telephone Encounter (Signed)
 Otolaryngology appointment 02/20/2024 @ [facility]

## 2024-01-29 ENCOUNTER — Other Ambulatory Visit: Payer: Self-pay | Admitting: Nurse Practitioner

## 2024-01-29 DIAGNOSIS — A310 Pulmonary mycobacterial infection: Secondary | ICD-10-CM

## 2024-01-30 ENCOUNTER — Telehealth: Payer: Self-pay

## 2024-02-02 ENCOUNTER — Other Ambulatory Visit: Payer: Self-pay | Admitting: Internal Medicine

## 2024-02-02 ENCOUNTER — Telehealth: Payer: Self-pay

## 2024-02-02 DIAGNOSIS — A310 Pulmonary mycobacterial infection: Secondary | ICD-10-CM

## 2024-02-02 DIAGNOSIS — F411 Generalized anxiety disorder: Secondary | ICD-10-CM

## 2024-02-02 DIAGNOSIS — J9611 Chronic respiratory failure with hypoxia: Secondary | ICD-10-CM

## 2024-02-02 NOTE — Telephone Encounter (Signed)
PLEASE CHECK

## 2024-02-02 NOTE — Telephone Encounter (Signed)
 Send message to [facility] for physical therapy  and also son advised we send home health referral

## 2024-02-05 ENCOUNTER — Telehealth: Payer: Self-pay

## 2024-02-05 NOTE — Telephone Encounter (Signed)
 Lmom for pt son to call us back due to home health try to call him please call office for [facility] at [phone]

## 2024-02-05 NOTE — Telephone Encounter (Signed)
 [facility] called they will start home health Monday 02/12/24 [phone]

## 2024-02-12 ENCOUNTER — Other Ambulatory Visit: Payer: Self-pay

## 2024-02-12 ENCOUNTER — Telehealth: Payer: Self-pay | Admitting: Internal Medicine

## 2024-02-12 ENCOUNTER — Other Ambulatory Visit: Payer: Self-pay | Admitting: Internal Medicine

## 2024-02-12 DIAGNOSIS — M069 Rheumatoid arthritis, unspecified: Secondary | ICD-10-CM | POA: Diagnosis not present

## 2024-02-12 DIAGNOSIS — A310 Pulmonary mycobacterial infection: Secondary | ICD-10-CM | POA: Diagnosis not present

## 2024-02-12 DIAGNOSIS — G629 Polyneuropathy, unspecified: Secondary | ICD-10-CM | POA: Diagnosis not present

## 2024-02-12 DIAGNOSIS — M810 Age-related osteoporosis without current pathological fracture: Secondary | ICD-10-CM

## 2024-02-12 DIAGNOSIS — J479 Bronchiectasis, uncomplicated: Secondary | ICD-10-CM | POA: Diagnosis not present

## 2024-02-12 DIAGNOSIS — M3500 Sicca syndrome, unspecified: Secondary | ICD-10-CM | POA: Diagnosis not present

## 2024-02-12 DIAGNOSIS — J9612 Chronic respiratory failure with hypercapnia: Secondary | ICD-10-CM | POA: Diagnosis not present

## 2024-02-12 DIAGNOSIS — H9193 Unspecified hearing loss, bilateral: Secondary | ICD-10-CM | POA: Diagnosis not present

## 2024-02-12 DIAGNOSIS — Z9981 Dependence on supplemental oxygen: Secondary | ICD-10-CM | POA: Diagnosis not present

## 2024-02-12 DIAGNOSIS — J9611 Chronic respiratory failure with hypoxia: Secondary | ICD-10-CM | POA: Diagnosis not present

## 2024-02-12 DIAGNOSIS — F411 Generalized anxiety disorder: Secondary | ICD-10-CM | POA: Diagnosis not present

## 2024-02-12 DIAGNOSIS — I10 Essential (primary) hypertension: Secondary | ICD-10-CM | POA: Diagnosis not present

## 2024-02-12 DIAGNOSIS — Z8572 Personal history of non-Hodgkin lymphomas: Secondary | ICD-10-CM | POA: Diagnosis not present

## 2024-02-12 DIAGNOSIS — Z8701 Personal history of pneumonia (recurrent): Secondary | ICD-10-CM | POA: Diagnosis not present

## 2024-02-12 DIAGNOSIS — G479 Sleep disorder, unspecified: Secondary | ICD-10-CM | POA: Diagnosis not present

## 2024-02-12 DIAGNOSIS — N390 Urinary tract infection, site not specified: Secondary | ICD-10-CM | POA: Diagnosis not present

## 2024-02-12 DIAGNOSIS — K117 Disturbances of salivary secretion: Secondary | ICD-10-CM | POA: Diagnosis not present

## 2024-02-12 DIAGNOSIS — Z556 Problems related to health literacy: Secondary | ICD-10-CM | POA: Diagnosis not present

## 2024-02-12 DIAGNOSIS — G729 Myopathy, unspecified: Secondary | ICD-10-CM | POA: Diagnosis not present

## 2024-02-12 MED ORDER — MIRTAZAPINE 7.5 MG PO TABS: 7.5000 mg | ORAL_TABLET | Freq: Every day | ORAL | 3 refills | Status: DC

## 2024-02-12 NOTE — Telephone Encounter (Signed)
 Spoke with pt son he never received mirtazapine  sent again to local phar and advised if not helping she unable to sleep he can call back and make appt with any dr at practice

## 2024-02-13 DIAGNOSIS — M3500 Sicca syndrome, unspecified: Secondary | ICD-10-CM | POA: Diagnosis not present

## 2024-02-13 DIAGNOSIS — J9611 Chronic respiratory failure with hypoxia: Secondary | ICD-10-CM | POA: Diagnosis not present

## 2024-02-13 DIAGNOSIS — A310 Pulmonary mycobacterial infection: Secondary | ICD-10-CM | POA: Diagnosis not present

## 2024-02-13 DIAGNOSIS — J9612 Chronic respiratory failure with hypercapnia: Secondary | ICD-10-CM | POA: Diagnosis not present

## 2024-02-13 DIAGNOSIS — M069 Rheumatoid arthritis, unspecified: Secondary | ICD-10-CM | POA: Diagnosis not present

## 2024-02-13 DIAGNOSIS — J479 Bronchiectasis, uncomplicated: Secondary | ICD-10-CM | POA: Diagnosis not present

## 2024-02-14 ENCOUNTER — Ambulatory Visit: Payer: Medicare Other | Attending: Cardiovascular Disease

## 2024-02-14 DIAGNOSIS — R0600 Dyspnea, unspecified: Secondary | ICD-10-CM | POA: Insufficient documentation

## 2024-02-14 LAB — ECHOCARDIOGRAM COMPLETE
AR max vel: 0.43 cm2
AV Area VTI: 0.3 cm2
AV Area mean vel: 0.45 cm2
AV Mean grad: 42 mmHg
AV Peak grad: 86.1 mmHg
Ao pk vel: 4.64 m/s
Area-P 1/2: 4.21 cm2
MV VTI: 0.83 cm2

## 2024-02-16 ENCOUNTER — Telehealth: Payer: Self-pay

## 2024-02-16 NOTE — Telephone Encounter (Signed)
 Darral Dash from [facility] called to request moving patient's evaluation to Monday, 3/31. Gave verbal approval for patient.

## 2024-02-16 NOTE — Telephone Encounter (Signed)
 Cindy from [facility] called asking for verbal orders for home health PT for 1 week 1. 2 week 3, and 1 week 5. Verbal order were given. [phone]

## 2024-02-18 ENCOUNTER — Other Ambulatory Visit: Payer: Self-pay | Admitting: Nurse Practitioner

## 2024-02-18 DIAGNOSIS — A310 Pulmonary mycobacterial infection: Secondary | ICD-10-CM

## 2024-02-19 DIAGNOSIS — J9611 Chronic respiratory failure with hypoxia: Secondary | ICD-10-CM | POA: Diagnosis not present

## 2024-02-19 DIAGNOSIS — A310 Pulmonary mycobacterial infection: Secondary | ICD-10-CM | POA: Diagnosis not present

## 2024-02-19 DIAGNOSIS — M3500 Sicca syndrome, unspecified: Secondary | ICD-10-CM | POA: Diagnosis not present

## 2024-02-19 DIAGNOSIS — J479 Bronchiectasis, uncomplicated: Secondary | ICD-10-CM | POA: Diagnosis not present

## 2024-02-19 DIAGNOSIS — J9612 Chronic respiratory failure with hypercapnia: Secondary | ICD-10-CM | POA: Diagnosis not present

## 2024-02-19 DIAGNOSIS — M069 Rheumatoid arthritis, unspecified: Secondary | ICD-10-CM | POA: Diagnosis not present

## 2024-02-20 ENCOUNTER — Encounter: Payer: Self-pay | Admitting: Medical

## 2024-02-20 ENCOUNTER — Telehealth: Payer: Self-pay | Admitting: Internal Medicine

## 2024-02-20 ENCOUNTER — Ambulatory Visit: Payer: Medicare Other | Attending: Medical | Admitting: Medical

## 2024-02-20 VITALS — BP 112/56 | HR 100 | Ht 61.0 in | Wt 103.2 lb

## 2024-02-20 DIAGNOSIS — R000 Tachycardia, unspecified: Secondary | ICD-10-CM | POA: Diagnosis not present

## 2024-02-20 DIAGNOSIS — I350 Nonrheumatic aortic (valve) stenosis: Secondary | ICD-10-CM | POA: Diagnosis not present

## 2024-02-20 DIAGNOSIS — J479 Bronchiectasis, uncomplicated: Secondary | ICD-10-CM | POA: Diagnosis not present

## 2024-02-20 DIAGNOSIS — H6063 Unspecified chronic otitis externa, bilateral: Secondary | ICD-10-CM | POA: Diagnosis not present

## 2024-02-20 DIAGNOSIS — M069 Rheumatoid arthritis, unspecified: Secondary | ICD-10-CM | POA: Diagnosis not present

## 2024-02-20 DIAGNOSIS — R0600 Dyspnea, unspecified: Secondary | ICD-10-CM | POA: Insufficient documentation

## 2024-02-20 DIAGNOSIS — J9612 Chronic respiratory failure with hypercapnia: Secondary | ICD-10-CM | POA: Diagnosis not present

## 2024-02-20 DIAGNOSIS — M3500 Sicca syndrome, unspecified: Secondary | ICD-10-CM | POA: Diagnosis not present

## 2024-02-20 DIAGNOSIS — H6123 Impacted cerumen, bilateral: Secondary | ICD-10-CM | POA: Diagnosis not present

## 2024-02-20 DIAGNOSIS — H903 Sensorineural hearing loss, bilateral: Secondary | ICD-10-CM | POA: Diagnosis not present

## 2024-02-20 DIAGNOSIS — A310 Pulmonary mycobacterial infection: Secondary | ICD-10-CM | POA: Diagnosis not present

## 2024-02-20 DIAGNOSIS — J9611 Chronic respiratory failure with hypoxia: Secondary | ICD-10-CM | POA: Diagnosis not present

## 2024-02-20 NOTE — Progress Notes (Signed)
 Cardiology Office Note:  .   Date:  02/20/2024  ID:  Breanna Christian Speaker, DOB 08-Oct-1941, MRN [id number] PCP: Lyndon Code, MD  [facility] Providers Cardiologist:  Breanna Bears, MD {  History of Present Illness: .   Breanna Christian is a 83 y.o. female with a history of chronic respiratory failure, severe COPD, MAI infection, hypertension, aortic stenosis and mitral regurgitation, non-Hodgkin's lymphoma in 2021 treated with chemotherapy and radiation who presents for follow-up for aortic stenosis.  Echo in June 2021 showed normal LVSF, mild aortic stenosis, mild to moderate MR.  She had non-Hodgkin's lymphoma in 2021 that was treated with chemotherapy and radiation.  Since that time, she developed severe lung disease.  She had extensive secondhand smoking and was exposed to different chemicals.  She had bronchoscopy done in 2017 and was diagnosed with Mycobacterium AVM complex colonization.  She has frequent pneumonias requiring antibiotics.  She is on 2 L of O2 but respiratory status continues to deteriorate.  Patient was seen February 2025 reporting severe exertional dyspnea.  Echo showed EF 60 to 65%, normal RV SF, moderate to severe MI, mild to moderate MS, severe mitral annular calcification, mild to moderate TR, severe calcification of the aortic valve with mild AI, severe aortic stenosis, VTI 0.30 cm, aortic valve mean gradient 42 mmHg.  Today,the patient is accompanied by her son, who is interpreting. He said that in December she was treated for an infection and the antibiotic causes severe swelling. Since then, function and energy have been low, but seems to be improving. She has been doing PT at home. She has been eating more on appetite stimulant. Patient has lower leg edema. She is not on a diuretic. She elevates her legs and uses compression socks. Said they have tried lasix in the past, but it caused her to be dehydrated. Breathing is overall the same. She denies chest  pain.    Studies Reviewed: .       Echo 01/2024  1. Left ventricular ejection fraction, by estimation, is 60 to 65%. The  left ventricle has normal function. The left ventricle has no regional  wall motion abnormalities. Left ventricular diastolic parameters are  indeterminate.   2. Right ventricular systolic function is normal. The right ventricular  size is normal. There is mildly elevated pulmonary artery systolic  pressure. The estimated right ventricular systolic pressure is 38.2 mmHg.   3. The mitral valve is normal in structure. Moderate to severe mitral  valve regurgitation. Mild to moderate mitral stenosis. The mean mitral  valve gradient is 8.0 mmHg. Severe mitral annular calcification.   4. Tricuspid valve regurgitation is mild to moderate.   5. The aortic valve is normal in structure. There is severe calcifcation  of the aortic valve. Aortic valve regurgitation is mild. Severe aortic  valve stenosis. Aortic valve area, by VTI measures 0.30 cm. Aortic valve  mean gradient measures 42.0 mmHg.  Aortic valve Vmax measures 4.64 m/s.   6. The inferior vena cava is normal in size with greater than 50%  respiratory variability, suggesting right atrial pressure of 3 mmHg.      Physical Exam:   VS:  BP (!) 112/56 (BP Location: Right Arm)   Pulse 100   Ht 5\' 1"  (1.549 m)   Wt 103 lb 3.2 oz (46.8 kg)   SpO2 96%   BMI 19.50 kg/m    Wt Readings from Last 3 Encounters:  02/20/24 103 lb 3.2 oz (46.8 kg)  01/18/24  105 lb (47.6 kg)  12/25/23 107 lb (48.5 kg)    GEN: Well nourished, well developed in no acute distress NECK: No JVD; No carotid bruits CARDIAC: RRR, + murmurs, no rubs, gallops RESPIRATORY:  Clear to auscultation without rales, wheezing or rhonchi  ABDOMEN: Soft, non-tender, non-distended EXTREMITIES:  mild pedal edema; No deformity   ASSESSMENT AND PLAN: .    Severe Aortic stenosis Recent echo showed LVEF 60-65%, indeterminate diastolic parameters, normal RVSF,  mildly elevated pulmonary artery systolic function, mod to severe MR, mild to mod TR, severe aortic stenosis , VTI 0.30cm2, mean gradient . Patient has chronic SOB due to chronic respiratory failure, COPD, MAI, frequent pneumonias and now aortic stenosis. She is on her baseline O2. Overall, they feel like patient is improving since December. She does have lower leg edema, but it's felt this is getting better as function increases. Patient is not a good candidate for sedation/surgeries. I suspect she is not a good TAVR candidate, however I will refer to the TAVR team for official recommendations. She is elevating feet and wears compression socks. Not interested in a diuretic at this time.   Severe exertional dyspnea This is multifactorial given chronic respiratory failure, COPD, MAI, suspected pulmonary HTN, and AS. She is on supplemental O2 at all times. She is followed by PCP. She is on baseline O2.   Sinus tachycardia Likely due to underlying chronic hypoxia. Avoid BB due to lung disease.      Dispo: Follow-up in 6 months  Signed, Tishawna Larouche David Stall, PA-C

## 2024-02-20 NOTE — Telephone Encounter (Signed)
 Received OT orders from [facility]. Gave to [facility] for signature-Toni

## 2024-02-20 NOTE — Patient Instructions (Signed)
 Medication Instructions:  Your physician recommends that you continue on your current medications as directed. Please refer to the Current Medication list given to you today.   *If you need a refill on your cardiac medications before your next appointment, please call your pharmacy*  Lab Work: No labs ordered today   Testing/Procedures: No test ordered today   Follow-Up: At [facility], you and your health needs are our priority.  As part of our continuing mission to provide you with exceptional heart care, our providers are all part of one team.  This team includes your primary Cardiologist (physician) and Advanced Practice Providers or APPs (Physician Assistants and Nurse Practitioners) who all work together to provide you with the care you need, when you need it.  Your next appointment:   6 month(s)  Provider:   You may see Lorine Bears, MD or one of the following Advanced Practice Providers on your designated Care Team:   Nicolasa Ducking, NP Ames Dura, PA-C Eula Listen, PA-C Cadence Brush, PA-C Charlsie Quest, NP Carlos Levering, NP    We recommend signing up for the patient portal called "MyChart".  Sign up information is provided on this After Visit Summary.  MyChart is used to connect with patients for Virtual Visits (Telemedicine).  Patients are able to view lab/test results, encounter notes, upcoming appointments, etc.  Non-urgent messages can be sent to your provider as well.   To learn more about what you can do with MyChart, go to [email or web address].

## 2024-02-22 ENCOUNTER — Telehealth: Payer: Self-pay | Admitting: Internal Medicine

## 2024-02-22 NOTE — Telephone Encounter (Signed)
 OT order signed. [facility]; [phone]. Scanned-Toni

## 2024-02-23 DIAGNOSIS — A310 Pulmonary mycobacterial infection: Secondary | ICD-10-CM | POA: Diagnosis not present

## 2024-02-23 DIAGNOSIS — M3500 Sicca syndrome, unspecified: Secondary | ICD-10-CM | POA: Diagnosis not present

## 2024-02-23 DIAGNOSIS — J479 Bronchiectasis, uncomplicated: Secondary | ICD-10-CM | POA: Diagnosis not present

## 2024-02-23 DIAGNOSIS — J9611 Chronic respiratory failure with hypoxia: Secondary | ICD-10-CM | POA: Diagnosis not present

## 2024-02-23 DIAGNOSIS — M069 Rheumatoid arthritis, unspecified: Secondary | ICD-10-CM | POA: Diagnosis not present

## 2024-02-23 DIAGNOSIS — J9612 Chronic respiratory failure with hypercapnia: Secondary | ICD-10-CM | POA: Diagnosis not present

## 2024-02-27 ENCOUNTER — Ambulatory Visit: Payer: Medicare Other | Admitting: Internal Medicine

## 2024-02-27 DIAGNOSIS — J9611 Chronic respiratory failure with hypoxia: Secondary | ICD-10-CM | POA: Diagnosis not present

## 2024-02-27 DIAGNOSIS — A310 Pulmonary mycobacterial infection: Secondary | ICD-10-CM | POA: Diagnosis not present

## 2024-02-27 DIAGNOSIS — J9612 Chronic respiratory failure with hypercapnia: Secondary | ICD-10-CM | POA: Diagnosis not present

## 2024-02-27 DIAGNOSIS — M3500 Sicca syndrome, unspecified: Secondary | ICD-10-CM | POA: Diagnosis not present

## 2024-02-27 DIAGNOSIS — J479 Bronchiectasis, uncomplicated: Secondary | ICD-10-CM | POA: Diagnosis not present

## 2024-02-27 DIAGNOSIS — M069 Rheumatoid arthritis, unspecified: Secondary | ICD-10-CM | POA: Diagnosis not present

## 2024-02-29 DIAGNOSIS — J479 Bronchiectasis, uncomplicated: Secondary | ICD-10-CM | POA: Diagnosis not present

## 2024-02-29 DIAGNOSIS — M069 Rheumatoid arthritis, unspecified: Secondary | ICD-10-CM | POA: Diagnosis not present

## 2024-02-29 DIAGNOSIS — M3500 Sicca syndrome, unspecified: Secondary | ICD-10-CM | POA: Diagnosis not present

## 2024-02-29 DIAGNOSIS — J9612 Chronic respiratory failure with hypercapnia: Secondary | ICD-10-CM | POA: Diagnosis not present

## 2024-02-29 DIAGNOSIS — A310 Pulmonary mycobacterial infection: Secondary | ICD-10-CM | POA: Diagnosis not present

## 2024-02-29 DIAGNOSIS — J9611 Chronic respiratory failure with hypoxia: Secondary | ICD-10-CM | POA: Diagnosis not present

## 2024-03-04 DIAGNOSIS — A310 Pulmonary mycobacterial infection: Secondary | ICD-10-CM | POA: Diagnosis not present

## 2024-03-04 DIAGNOSIS — J9611 Chronic respiratory failure with hypoxia: Secondary | ICD-10-CM | POA: Diagnosis not present

## 2024-03-04 DIAGNOSIS — J9612 Chronic respiratory failure with hypercapnia: Secondary | ICD-10-CM | POA: Diagnosis not present

## 2024-03-04 DIAGNOSIS — M3500 Sicca syndrome, unspecified: Secondary | ICD-10-CM | POA: Diagnosis not present

## 2024-03-04 DIAGNOSIS — J479 Bronchiectasis, uncomplicated: Secondary | ICD-10-CM | POA: Diagnosis not present

## 2024-03-04 DIAGNOSIS — M069 Rheumatoid arthritis, unspecified: Secondary | ICD-10-CM | POA: Diagnosis not present

## 2024-03-05 ENCOUNTER — Ambulatory Visit (INDEPENDENT_AMBULATORY_CARE_PROVIDER_SITE_OTHER): Admitting: Internal Medicine

## 2024-03-05 ENCOUNTER — Encounter: Payer: Self-pay | Admitting: Internal Medicine

## 2024-03-05 ENCOUNTER — Other Ambulatory Visit: Payer: Self-pay | Admitting: Internal Medicine

## 2024-03-05 VITALS — BP 120/70 | HR 95 | Temp 97.8°F | Resp 16 | Ht 61.0 in | Wt 105.0 lb

## 2024-03-05 DIAGNOSIS — J479 Bronchiectasis, uncomplicated: Secondary | ICD-10-CM | POA: Diagnosis not present

## 2024-03-05 DIAGNOSIS — G4700 Insomnia, unspecified: Secondary | ICD-10-CM | POA: Diagnosis not present

## 2024-03-05 DIAGNOSIS — J9612 Chronic respiratory failure with hypercapnia: Secondary | ICD-10-CM

## 2024-03-05 DIAGNOSIS — J4489 Other specified chronic obstructive pulmonary disease: Secondary | ICD-10-CM

## 2024-03-05 DIAGNOSIS — F411 Generalized anxiety disorder: Secondary | ICD-10-CM

## 2024-03-05 DIAGNOSIS — A310 Pulmonary mycobacterial infection: Secondary | ICD-10-CM | POA: Diagnosis not present

## 2024-03-05 DIAGNOSIS — M069 Rheumatoid arthritis, unspecified: Secondary | ICD-10-CM | POA: Diagnosis not present

## 2024-03-05 DIAGNOSIS — J9611 Chronic respiratory failure with hypoxia: Secondary | ICD-10-CM

## 2024-03-05 DIAGNOSIS — M3500 Sicca syndrome, unspecified: Secondary | ICD-10-CM | POA: Diagnosis not present

## 2024-03-05 MED ORDER — AZITHROMYCIN 250 MG PO TABS: 250.0000 mg | ORAL_TABLET | Freq: Every day | ORAL | 3 refills | Status: DC

## 2024-03-05 MED ORDER — MIRTAZAPINE 15 MG PO TABS
15.0000 mg | ORAL_TABLET | Freq: Every day | ORAL | 3 refills | Status: DC
Start: 2024-03-05 — End: 2024-05-10

## 2024-03-05 MED ORDER — ETHAMBUTOL HCL 400 MG PO TABS: 400.0000 mg | ORAL_TABLET | Freq: Every day | ORAL | 3 refills | Status: DC

## 2024-03-05 MED ORDER — BREZTRI AEROSPHERE 160-9-4.8 MCG/ACT IN AERO: 2.0000 | INHALATION_SPRAY | Freq: Two times a day (BID) | RESPIRATORY_TRACT | 3 refills | Status: DC

## 2024-03-05 NOTE — Progress Notes (Signed)
 [facility] [address] [place], [place] [address]  Internal MEDICINE  Office Visit Note  Patient Name: Breanna Christian  [id number]  [id number]  Date of Service: 04/11/2024  Chief Complaint  Patient presents with   Follow-up   Hypertension    HPI Patient is here with her son for follow-up on multiple medical problems Patient has MAI, has been on triple antibiotics for more than 5 years she does have declining respiratory status Recently seen by pulmonary and she is on NIV along with oxygen , has been maintaining her pulmonary status since then Patient has done extremely well with her appetite and anxiety and sleep with Remeron  Son is requesting few refills    Current Medication: Outpatient Encounter Medications as of 03/05/2024  Medication Sig   albuterol  (VENTOLIN  HFA) 108 (90 Base) MCG/ACT inhaler Inhale 2 puffs into the lungs every 6 (six) hours as needed for wheezing or shortness of breath.   ALPRAZolam  (XANAX ) 0.5 MG tablet Take half tab in am and one at night   ARTIFICIAL TEAR OP Place 1 drop into both eyes every 6 (six) hours as needed (dry eyes). A product from [place].   ibandronate  (BONIVA ) 150 MG tablet TAKE 1 TABLET (150 MG TOTAL) BY MOUTH EVERY 30 (THIRTY) DAYS. TAKE IN THE MORNING WITH A FULL GLASS OF WATER, ON AN EMPTY STOMACH, AND DO NOT TAKE ANYTHING ELSE BY MOUTH OR LIE DOWN FOR THE NEXT 30 MIN.   ibuprofen (ADVIL,MOTRIN) 200 MG tablet Take 200 mg by mouth as needed.   ipratropium-albuterol  (DUONEB) 0.5-2.5 (3) MG/3ML SOLN Take 3 mLs by nebulization every 2 (two) hours as needed. DX: J44.9   Lifitegrast 5 % SOLN Apply 1 drop to eye in the morning and at bedtime. 1 Gtt both eyes twice a day   mirtazapine  (REMERON ) 15 MG tablet Take 1 tablet (15 mg total) by mouth at bedtime.   Multiple Vitamin (MULTI-VITAMINS) TABS Take 1 tablet by mouth daily. Reported on 04/25/2016   Omega-3 Fatty Acids (OMEGA-3 FISH OIL) 300 MG CAPS Take by mouth daily.   rifampin   (RIFADIN ) 150 MG capsule TAKE 1 CAPSULE DAILY   sodium chloride  HYPERTONIC 3 % nebulizer solution Take by nebulization as needed for other.   [DISCONTINUED] azithromycin  (ZITHROMAX ) 250 MG tablet TAKE 1 TABLET DAILY   [DISCONTINUED] Budeson-Glycopyrrol-Formoterol  (BREZTRI  AEROSPHERE) 160-9-4.8 MCG/ACT AERO Inhale 2 puffs into the lungs 2 (two) times daily.   [DISCONTINUED] ethambutol  (MYAMBUTOL ) 400 MG tablet TAKE 1 TABLET DAILY   [DISCONTINUED] mirtazapine  (REMERON ) 7.5 MG tablet Take 1 tablet (7.5 mg total) by mouth at bedtime.   azithromycin  (ZITHROMAX ) 250 MG tablet Take 1 tablet (250 mg total) by mouth daily.   budeson-glycopyrrolate-formoterol  (BREZTRI  AEROSPHERE) 160-9-4.8 MCG/ACT AERO inhaler Inhale 2 puffs into the lungs 2 (two) times daily.   ethambutol  (MYAMBUTOL ) 400 MG tablet Take 1 tablet (400 mg total) by mouth daily.   No facility-administered encounter medications on file as of 03/05/2024.    Surgical History: Past Surgical History:  Procedure Laterality Date   APPENDECTOMY     BRONCHOSCOPY  02/2016   CATARACT EXTRACTION W/PHACO Right 10/26/2016   Procedure: CATARACT EXTRACTION PHACO AND INTRAOCULAR LENS PLACEMENT (IOC);  Surgeon: Steven Dingeldein, MD;  Location: [facility] ORS;  Service: Ophthalmology;  Laterality: Right;  US  2.17 AP% 26.5 CDE 56.86 Fluid pack lot # [id number]   FLEXIBLE BRONCHOSCOPY Bilateral 03/16/2016   Procedure: FLEXIBLE BRONCHOSCOPY;  Surgeon: Cordie Deters, MD;  Location: [facility] ORS;  Service: Pulmonary;  Laterality: Bilateral;  FLEXIBLE BRONCHOSCOPY N/A 03/03/2017   Procedure: FLEXIBLE BRONCHOSCOPY;  Surgeon: Cordie Deters, MD;  Location: [facility] ORS;  Service: Pulmonary;  Laterality: N/A;   TUBAL LIGATION      Medical History: Past Medical History:  Diagnosis Date   Bronchiectasis (HCC) 2011   COPD (chronic obstructive pulmonary disease) (HCC)    HOH (hard of hearing)    Hypertension    Lung nodules    Mycobacterium avium complex colonization     Non-Hodgkin lymphoma (HCC) 2003   Personal history of chemotherapy    Pneumonia    FREQUENT IN PAST    H/O MYCOBACTERIAL   Rheumatoid arthritis (HCC)    Sjogren's syndrome (HCC)    Xerostomia     Family History: Family History  Problem Relation Age of Onset   Cancer Sister    Breast cancer Neg Hx     Social History   Socioeconomic History   Marital status: Married    Spouse name: Not on file   Number of children: Not on file   Years of education: Not on file   Highest education level: Not on file  Occupational History   Not on file  Tobacco Use   Smoking status: Never   Smokeless tobacco: Never  Vaping Use   Vaping status: Never Used  Substance and Sexual Activity   Alcohol use: No   Drug use: No   Sexual activity: Not on file  Other Topics Concern   Not on file  Social History Narrative   Not on file   Social Drivers of Health   Financial Resource Strain: Not on file  Food Insecurity: Not on file  Transportation Needs: Not on file  Physical Activity: Not on file  Stress: Not on file  Social Connections: Not on file  Intimate Partner Violence: Not on file      Review of Systems  Constitutional:  Negative for fatigue and fever.  HENT:  Negative for congestion, mouth sores and postnasal drip.   Respiratory:  Positive for shortness of breath. Negative for cough.   Cardiovascular:  Negative for chest pain.  Genitourinary:  Negative for flank pain.  Psychiatric/Behavioral: Negative.      Vital Signs: BP 120/70 (Cuff Size: Small)   Pulse 95   Temp 97.8 F (36.6 C)   Resp 16   Ht 5\' 1"  (1.549 m)   Wt 105 lb (47.6 kg)   SpO2 96% Comment: 2 L  BMI 19.84 kg/m    Physical Exam Constitutional:      Appearance: Normal appearance.  HENT:     Head: Normocephalic and atraumatic.     Nose: Nose normal.     Mouth/Throat:     Mouth: Mucous membranes are moist.     Pharynx: No posterior oropharyngeal erythema.  Eyes:     Extraocular Movements:  Extraocular movements intact.     Pupils: Pupils are equal, round, and reactive to light.  Cardiovascular:     Pulses: Normal pulses.     Heart sounds: Normal heart sounds.  Pulmonary:     Effort: Pulmonary effort is normal.     Breath sounds: Normal breath sounds.  Neurological:     General: No focal deficit present.     Mental Status: She is alert.  Psychiatric:        Mood and Affect: Mood normal.        Behavior: Behavior normal.        Assessment/Plan: 1. Chronic respiratory failure with hypoxia and hypercapnia (  HCC) (Primary) Will continue patient on oxygen  supplement and NIV, patient will continue to follow with him on  2. GAD (generalized anxiety disorder) Increase mirtazapine  to 15 mg at bedtime - mirtazapine  (REMERON ) 15 MG tablet; Take 1 tablet (15 mg total) by mouth at bedtime.  Dispense: 90 tablet; Refill: 3  3. Insomnia disorder related to known organic factor Continue mirtazapine  alprazolam  as before - mirtazapine  (REMERON ) 15 MG tablet; Take 1 tablet (15 mg total) by mouth at bedtime.  Dispense: 90 tablet; Refill: 3  4. MAI (mycobacterium avium-intracellulare) infection (HCC) Referral per pulmonary - azithromycin  (ZITHROMAX ) 250 MG tablet; Take 1 tablet (250 mg total) by mouth daily.  Dispense: 90 tablet; Refill: 3 - ethambutol  (MYAMBUTOL ) 400 MG tablet; Take 1 tablet (400 mg total) by mouth daily.  Dispense: 90 tablet; Refill: 3  5. Obstructive chronic bronchitis without exacerbation (HCC) Continue with oxygen  - budeson-glycopyrrolate-formoterol  (BREZTRI  AEROSPHERE) 160-9-4.8 MCG/ACT AERO inhaler; Inhale 2 puffs into the lungs 2 (two) times daily.  Dispense: 3 each; Refill: 3   General Counseling: Annleigh verbalizes understanding of the findings of todays visit and agrees with plan of treatment. I have discussed any further diagnostic evaluation that may be needed or ordered today. We also reviewed her medications today. she has been encouraged to call the  office with any questions or concerns that should arise related to todays visit.    No orders of the defined types were placed in this encounter.   Meds ordered this encounter  Medications   mirtazapine  (REMERON ) 15 MG tablet    Sig: Take 1 tablet (15 mg total) by mouth at bedtime.    Dispense:  90 tablet    Refill:  3   azithromycin  (ZITHROMAX ) 250 MG tablet    Sig: Take 1 tablet (250 mg total) by mouth daily.    Dispense:  90 tablet    Refill:  3   ethambutol  (MYAMBUTOL ) 400 MG tablet    Sig: Take 1 tablet (400 mg total) by mouth daily.    Dispense:  90 tablet    Refill:  3   budeson-glycopyrrolate-formoterol  (BREZTRI  AEROSPHERE) 160-9-4.8 MCG/ACT AERO inhaler    Sig: Inhale 2 puffs into the lungs 2 (two) times daily.    Dispense:  3 each    Refill:  3    Total time spent:45 Minutes Time spent includes review of chart, medications, test results, and follow up plan with the patient.   [place] Controlled Substance Database was reviewed by me.   Dr Maren Wiesen M Hildy Nicholl Internal medicine

## 2024-03-06 DIAGNOSIS — J9612 Chronic respiratory failure with hypercapnia: Secondary | ICD-10-CM | POA: Diagnosis not present

## 2024-03-06 DIAGNOSIS — A310 Pulmonary mycobacterial infection: Secondary | ICD-10-CM | POA: Diagnosis not present

## 2024-03-06 DIAGNOSIS — J479 Bronchiectasis, uncomplicated: Secondary | ICD-10-CM | POA: Diagnosis not present

## 2024-03-06 DIAGNOSIS — J9611 Chronic respiratory failure with hypoxia: Secondary | ICD-10-CM | POA: Diagnosis not present

## 2024-03-06 DIAGNOSIS — M3500 Sicca syndrome, unspecified: Secondary | ICD-10-CM | POA: Diagnosis not present

## 2024-03-06 DIAGNOSIS — M069 Rheumatoid arthritis, unspecified: Secondary | ICD-10-CM | POA: Diagnosis not present

## 2024-03-07 DIAGNOSIS — J479 Bronchiectasis, uncomplicated: Secondary | ICD-10-CM | POA: Diagnosis not present

## 2024-03-07 DIAGNOSIS — M069 Rheumatoid arthritis, unspecified: Secondary | ICD-10-CM | POA: Diagnosis not present

## 2024-03-07 DIAGNOSIS — J9612 Chronic respiratory failure with hypercapnia: Secondary | ICD-10-CM | POA: Diagnosis not present

## 2024-03-07 DIAGNOSIS — M3500 Sicca syndrome, unspecified: Secondary | ICD-10-CM | POA: Diagnosis not present

## 2024-03-07 DIAGNOSIS — J9611 Chronic respiratory failure with hypoxia: Secondary | ICD-10-CM | POA: Diagnosis not present

## 2024-03-07 DIAGNOSIS — A310 Pulmonary mycobacterial infection: Secondary | ICD-10-CM | POA: Diagnosis not present

## 2024-03-12 DIAGNOSIS — J479 Bronchiectasis, uncomplicated: Secondary | ICD-10-CM | POA: Diagnosis not present

## 2024-03-12 DIAGNOSIS — J9612 Chronic respiratory failure with hypercapnia: Secondary | ICD-10-CM | POA: Diagnosis not present

## 2024-03-12 DIAGNOSIS — A310 Pulmonary mycobacterial infection: Secondary | ICD-10-CM | POA: Diagnosis not present

## 2024-03-12 DIAGNOSIS — M3500 Sicca syndrome, unspecified: Secondary | ICD-10-CM | POA: Diagnosis not present

## 2024-03-12 DIAGNOSIS — M069 Rheumatoid arthritis, unspecified: Secondary | ICD-10-CM | POA: Diagnosis not present

## 2024-03-12 DIAGNOSIS — J9611 Chronic respiratory failure with hypoxia: Secondary | ICD-10-CM | POA: Diagnosis not present

## 2024-03-13 DIAGNOSIS — Z8701 Personal history of pneumonia (recurrent): Secondary | ICD-10-CM | POA: Diagnosis not present

## 2024-03-13 DIAGNOSIS — M3500 Sicca syndrome, unspecified: Secondary | ICD-10-CM | POA: Diagnosis not present

## 2024-03-13 DIAGNOSIS — H9193 Unspecified hearing loss, bilateral: Secondary | ICD-10-CM | POA: Diagnosis not present

## 2024-03-13 DIAGNOSIS — A310 Pulmonary mycobacterial infection: Secondary | ICD-10-CM | POA: Diagnosis not present

## 2024-03-13 DIAGNOSIS — J9612 Chronic respiratory failure with hypercapnia: Secondary | ICD-10-CM | POA: Diagnosis not present

## 2024-03-13 DIAGNOSIS — G479 Sleep disorder, unspecified: Secondary | ICD-10-CM | POA: Diagnosis not present

## 2024-03-13 DIAGNOSIS — K117 Disturbances of salivary secretion: Secondary | ICD-10-CM | POA: Diagnosis not present

## 2024-03-13 DIAGNOSIS — Z8572 Personal history of non-Hodgkin lymphomas: Secondary | ICD-10-CM | POA: Diagnosis not present

## 2024-03-13 DIAGNOSIS — Z9981 Dependence on supplemental oxygen: Secondary | ICD-10-CM | POA: Diagnosis not present

## 2024-03-13 DIAGNOSIS — F411 Generalized anxiety disorder: Secondary | ICD-10-CM | POA: Diagnosis not present

## 2024-03-13 DIAGNOSIS — Z556 Problems related to health literacy: Secondary | ICD-10-CM | POA: Diagnosis not present

## 2024-03-13 DIAGNOSIS — M810 Age-related osteoporosis without current pathological fracture: Secondary | ICD-10-CM | POA: Diagnosis not present

## 2024-03-13 DIAGNOSIS — N390 Urinary tract infection, site not specified: Secondary | ICD-10-CM | POA: Diagnosis not present

## 2024-03-13 DIAGNOSIS — M069 Rheumatoid arthritis, unspecified: Secondary | ICD-10-CM | POA: Diagnosis not present

## 2024-03-13 DIAGNOSIS — J9611 Chronic respiratory failure with hypoxia: Secondary | ICD-10-CM | POA: Diagnosis not present

## 2024-03-13 DIAGNOSIS — J479 Bronchiectasis, uncomplicated: Secondary | ICD-10-CM | POA: Diagnosis not present

## 2024-03-13 DIAGNOSIS — G629 Polyneuropathy, unspecified: Secondary | ICD-10-CM | POA: Diagnosis not present

## 2024-03-13 DIAGNOSIS — I10 Essential (primary) hypertension: Secondary | ICD-10-CM | POA: Diagnosis not present

## 2024-03-13 DIAGNOSIS — G729 Myopathy, unspecified: Secondary | ICD-10-CM | POA: Diagnosis not present

## 2024-03-14 ENCOUNTER — Encounter: Payer: Self-pay | Admitting: Cardiovascular Disease

## 2024-03-14 ENCOUNTER — Ambulatory Visit: Attending: Cardiovascular Disease | Admitting: Cardiovascular Disease

## 2024-03-14 VITALS — BP 116/54 | HR 100 | Ht 61.0 in | Wt 104.2 lb

## 2024-03-14 DIAGNOSIS — I350 Nonrheumatic aortic (valve) stenosis: Secondary | ICD-10-CM | POA: Diagnosis not present

## 2024-03-14 NOTE — Patient Instructions (Signed)
 Follow-Up: At [facility], you and your health needs are our priority.  As part of our continuing mission to provide you with exceptional heart care, our providers are all part of one team.  This team includes your primary Cardiologist (physician) and Advanced Practice Providers or APPs (Physician Assistants and Nurse Practitioners) who all work together to provide you with the care you need, when you need it.  Your next appointment:   As Needed  Provider:   Tonny Bollman, MD      1st Floor: - Lobby - Registration  - Pharmacy  - Lab - Cafe  2nd Floor: - PV Lab - Diagnostic Testing (echo, CT, nuclear med)  3rd Floor: - Vacant  4th Floor: - TCTS (cardiothoracic surgery) - AFib Clinic - Structural Heart Clinic - Vascular Surgery  - Vascular Ultrasound  5th Floor: - HeartCare Cardiology (general and EP) - Clinical Pharmacy for coumadin, hypertension, lipid, weight-loss medications, and med management appointments    Valet parking services will be available as well.

## 2024-03-14 NOTE — Progress Notes (Signed)
 Cardiology Office Note:    Date:  03/17/2024   ID:  VALI CAPANO, DOB 03-Feb-1941, MRN [id number]  PCP:  Lawton Price, MD   [facility] Providers Cardiologist:  Antionette Kirks, MD     Referring MD: Lawton Price, MD   Chief Complaint  Patient presents with   Shortness of Breath    History of Present Illness:    Danetra T Lana is a 83 y.o. female referred for evaluation of severe aortic stenosis.  The patient is here with her son today.  She has a complex medical history with chronic respiratory failure on home oxygen , severe COPD with FEV1 of 0.6, non-Hodgkin's lymphoma in 2021 treated with chemoradiation, and recently diagnosed severe aortic stenosis.  She has been felt to be a poor candidate for intervention due to her advanced lung disease, but is referred for discussion of whether she could potentially be candidate for transcatheter aortic valve replacement.  The patient has noted some clinical improvement with use of positive airway pressure for about 4 hours/day.  Her appetite has stabilized after losing a good bit of weight over the past few years.  She is dyspneic with minimal activity.  She has orthopnea but no PND.  She has lower extremity edema but has had problems with dehydration when taking diuretics.  She denies chest pain or pressure.   Current Medications: Current Meds  Medication Sig   albuterol  (VENTOLIN  HFA) 108 (90 Base) MCG/ACT inhaler Inhale 2 puffs into the lungs every 6 (six) hours as needed for wheezing or shortness of breath.   ALPRAZolam  (XANAX ) 0.5 MG tablet Take half tab in am and one at night   ARTIFICIAL TEAR OP Place 1 drop into both eyes every 6 (six) hours as needed (dry eyes). A product from [place].   azithromycin  (ZITHROMAX ) 250 MG tablet Take 1 tablet (250 mg total) by mouth daily.   budeson-glycopyrrolate-formoterol  (BREZTRI  AEROSPHERE) 160-9-4.8 MCG/ACT AERO inhaler Inhale 2 puffs into the lungs 2 (two) times daily.    ethambutol  (MYAMBUTOL ) 400 MG tablet Take 1 tablet (400 mg total) by mouth daily.   ibandronate  (BONIVA ) 150 MG tablet TAKE 1 TABLET (150 MG TOTAL) BY MOUTH EVERY 30 (THIRTY) DAYS. TAKE IN THE MORNING WITH A FULL GLASS OF WATER, ON AN EMPTY STOMACH, AND DO NOT TAKE ANYTHING ELSE BY MOUTH OR LIE DOWN FOR THE NEXT 30 MIN.   ibuprofen (ADVIL,MOTRIN) 200 MG tablet Take 200 mg by mouth as needed.   ipratropium-albuterol  (DUONEB) 0.5-2.5 (3) MG/3ML SOLN Take 3 mLs by nebulization every 2 (two) hours as needed. DX: J44.9   Lifitegrast 5 % SOLN Apply 1 drop to eye in the morning and at bedtime. 1 Gtt both eyes twice a day   mirtazapine  (REMERON ) 15 MG tablet Take 1 tablet (15 mg total) by mouth at bedtime.   Multiple Vitamin (MULTI-VITAMINS) TABS Take 1 tablet by mouth daily. Reported on 04/25/2016   Omega-3 Fatty Acids (OMEGA-3 FISH OIL) 300 MG CAPS Take by mouth daily.   rifampin  (RIFADIN ) 150 MG capsule TAKE 1 CAPSULE DAILY   sodium chloride  HYPERTONIC 3 % nebulizer solution Take by nebulization as needed for other.     Allergies:   Prednisone    ROS:   Please see the history of present illness.    Positive for fatigue, decreased appetite, and weight loss.  Positive for chronic dyspnea.  All other systems reviewed and are negative.  EKGs/Labs/Other Studies Reviewed:    The following studies were reviewed  today: Cardiac Studies & Procedures   ______________________________________________________________________________________________     ECHOCARDIOGRAM  ECHOCARDIOGRAM COMPLETE 02/14/2024  Narrative ECHOCARDIOGRAM REPORT    Patient Name:   YASAMIN KAREL Date of Exam: 02/14/2024 Medical Rec #:  [id number]           Height:       62.0 in Accession #:    [id number]          Weight:       105.0 lb Date of Birth:  11/06/1941            BSA:          1.454 m Patient Age:    82 years            BP:           100/56 mmHg Patient Gender: F                   HR:           107 bpm. Exam  Location:  [place]  Procedure: 2D Echo, Color Doppler and Cardiac Doppler (Both Spectral and Color Flow Doppler were utilized during procedure).  Indications:    R06.9 DOE; R06.02 SOB  History:        Patient has no prior history of Echocardiogram examinations. Abnormal ECG, COPD, Aortic Valve Disease, Signs/Symptoms:Shortness of Breath, Dyspnea, Edema and Murmur; Risk Factors:Hypertension.  Sonographer:    Venson Ginger MHA, BS, RDCS Referring Phys: [facility] A [facility]   Sonographer Comments: Technically difficult study due to poor echo windows. Image acquisition challenging due to COPD and Image acquisition challenging due to respiratory motion. Critical AS is present. AVA 0.3 cm2 IMPRESSIONS   1. Left ventricular ejection fraction, by estimation, is 60 to 65%. The left ventricle has normal function. The left ventricle has no regional wall motion abnormalities. Left ventricular diastolic parameters are indeterminate. 2. Right ventricular systolic function is normal. The right ventricular size is normal. There is mildly elevated pulmonary artery systolic pressure. The estimated right ventricular systolic pressure is 38.2 mmHg. 3. The mitral valve is normal in structure. Moderate to severe mitral valve regurgitation. Mild to moderate mitral stenosis. The mean mitral valve gradient is 8.0 mmHg. Severe mitral annular calcification. 4. Tricuspid valve regurgitation is mild to moderate. 5. The aortic valve is normal in structure. There is severe calcifcation of the aortic valve. Aortic valve regurgitation is mild. Severe aortic valve stenosis. Aortic valve area, by VTI measures 0.30 cm. Aortic valve mean gradient measures 42.0 mmHg. Aortic valve Vmax measures 4.64 m/s. 6. The inferior vena cava is normal in size with greater than 50% respiratory variability, suggesting right atrial pressure of 3 mmHg.  FINDINGS Left Ventricle: Left ventricular ejection fraction, by estimation, is 60  to 65%. The left ventricle has normal function. The left ventricle has no regional wall motion abnormalities. Strain was performed and the global longitudinal strain is indeterminate. The left ventricular internal cavity size was normal in size. There is no left ventricular hypertrophy. Left ventricular diastolic parameters are indeterminate.  Right Ventricle: The right ventricular size is normal. No increase in right ventricular wall thickness. Right ventricular systolic function is normal. There is mildly elevated pulmonary artery systolic pressure. The tricuspid regurgitant velocity is 2.88 m/s, and with an assumed right atrial pressure of 5 mmHg, the estimated right ventricular systolic pressure is 38.2 mmHg.  Left Atrium: Left atrial size was normal in size.  Right Atrium: Right atrial size was normal in size.  Pericardium: There is no evidence of pericardial effusion.  Mitral Valve: The mitral valve is normal in structure. There is mild calcification of the mitral valve leaflet(s). Severe mitral annular calcification. Moderate to severe mitral valve regurgitation. Mild to moderate mitral valve stenosis. MV peak gradient, 18.0 mmHg. The mean mitral valve gradient is 8.0 mmHg.  Tricuspid Valve: The tricuspid valve is normal in structure. Tricuspid valve regurgitation is mild to moderate. No evidence of tricuspid stenosis.  Aortic Valve: The aortic valve is normal in structure. There is severe calcifcation of the aortic valve. Aortic valve regurgitation is mild. Severe aortic stenosis is present. Aortic valve mean gradient measures 42.0 mmHg. Aortic valve peak gradient measures 86.1 mmHg. Aortic valve area, by VTI measures 0.30 cm.  Pulmonic Valve: The pulmonic valve was normal in structure. Pulmonic valve regurgitation is not visualized. No evidence of pulmonic stenosis.  Aorta: The aortic root is normal in size and structure.  Venous: The inferior vena cava is normal in size with  greater than 50% respiratory variability, suggesting right atrial pressure of 3 mmHg.  IAS/Shunts: No atrial level shunt detected by color flow Doppler.  Additional Comments: 3D was performed not requiring image post processing on an independent workstation and was indeterminate.   LEFT VENTRICLE PLAX 2D LVOT diam:     1.70 cm   Diastology LV SV:         30        LV e' medial:    4.79 cm/s LV SV Index:   20        LV E/e' medial:  32.6 LVOT Area:     2.27 cm  LV e' lateral:   7.40 cm/s LV E/e' lateral: 21.1   RIGHT VENTRICLE RV S prime:     10.10 cm/s TAPSE (M-mode): 2.3 cm  LEFT ATRIUM             Index LA Vol (A2C):   61.8 ml 42.51 ml/m LA Vol (A4C):   58.8 ml 40.45 ml/m LA Biplane Vol: 61.1 ml 42.03 ml/m AORTIC VALVE AV Area (Vmax):    0.43 cm AV Area (Vmean):   0.45 cm AV Area (VTI):     0.30 cm AV Vmax:           464.00 cm/s AV Vmean:          281.000 cm/s AV VTI:            0.975 m AV Peak Grad:      86.1 mmHg AV Mean Grad:      42.0 mmHg LVOT Vmax:         88.70 cm/s LVOT Vmean:        55.800 cm/s LVOT VTI:          0.131 m LVOT/AV VTI ratio: 0.13  AORTA Ao Root diam: 2.90 cm Ao Asc diam:  3.00 cm  MITRAL VALVE                TRICUSPID VALVE MV Area (PHT): 4.21 cm     TR Peak grad:   33.2 mmHg MV Area VTI:   0.83 cm     TR Vmax:        288.00 cm/s MV Peak grad:  18.0 mmHg MV Mean grad:  8.0 mmHg     SHUNTS MV Vmax:       2.12 m/s     Systemic VTI:  0.13 m MV Vmean:      132.0 cm/s   Systemic Diam:  1.70 cm MV Decel Time: 180 msec MV E velocity: 156.00 cm/s MV A velocity: 204.00 cm/s MV E/A ratio:  0.76  Belva Boyden MD Electronically signed by Belva Boyden MD Signature Date/Time: 02/14/2024/7:31:21 PM    Final          ______________________________________________________________________________________________      EKG:        Recent Labs: No results found for requested labs within last 365 days.  Recent Lipid Panel     Component Value Date/Time   CHOL 168 08/01/2022 1147   TRIG 82 08/01/2022 1147   HDL 57 08/01/2022 1147   LDLCALC 96 08/01/2022 1147           Physical Exam:    VS:  BP (!) 116/54   Pulse 100   Ht 5\' 1"  (1.549 m)   Wt 104 lb 3.2 oz (47.3 kg)   SpO2 96%   BMI 19.69 kg/m     Wt Readings from Last 3 Encounters:  03/14/24 104 lb 3.2 oz (47.3 kg)  03/05/24 105 lb (47.6 kg)  02/20/24 103 lb 3.2 oz (46.8 kg)     GEN: Elderly, frail-appearing woman in no acute distress HEENT: Normal NECK: No JVD; No carotid bruits LYMPHATICS: No lymphadenopathy CARDIAC: RRR, distant heart sounds, 2/6 harsh crescendo decrescendo murmur at the right upper sternal border, no diastolic murmur RESPIRATORY: Diminished air movement throughout, prolonged expiratory phase ABDOMEN: Soft, non-tender, non-distended MUSCULOSKELETAL: 1+ bilateral ankle edema; No deformity  SKIN: Warm and dry NEUROLOGIC:  Alert and oriented x 3 PSYCHIATRIC:  Normal affect   Assessment & Plan Nonrheumatic aortic valve stenosis The patient has severe, stage D1 nonrheumatic aortic stenosis.  She is limited by NYHA functional class III symptoms of fatigue and shortness of breath likely driven by combination of her advanced lung disease and aortic stenosis.  I personally reviewed her echo imaging and this demonstrates preserved LVEF of 60 to 65% with no wall motion abnormalities, normal RV function, only mildly elevated pulmonary artery systolic pressures, moderate to severe mitral regurgitation, heavy mitral annular calcification, moderate mitral stenosis with a mean gradient of 8 mmHg, and severe aortic stenosis with both calcified and restricted leaflets.  Her aortic valve mean gradient is 42 mmHg with a peak transaortic velocity of 4.6 m/s and a dimensionless index of 0.19.  I reviewed the natural history of aortic stenosis with the patient and her son today.  They understand she would never be a candidate for conventional heart  surgery.  Her options are limited to palliative medical therapy versus transcatheter aortic valve replacement.  I carefully reviewed her pulmonary history.  Her most recent spirometry from 2023 shows an FEV1 of 0.6. She has an elevated pCO2 on ABG, requires 24/7 home oxygen , and uses positive pressure airway ventilation each day.  I think her chronic lung disease is too advanced to allow us  to safely proceed with TAVR even under deep conscious sedation.  In addition, the benefit of treating her aortic stenosis would be greatly reduced in the setting of her advanced lung disease.  It is appropriate to treat her in a palliative fashion and I clearly discussed this with the patient and her son today.  They understand and actually expected this but wanted to explore all potential treatment options.  All of their questions were answered.  I would be happy to see them back in the future as needed.            Medication Adjustments/Labs and Tests Ordered: Current  medicines are reviewed at length with the patient today.  Concerns regarding medicines are outlined above.  No orders of the defined types were placed in this encounter.  No orders of the defined types were placed in this encounter.   Patient Instructions  Follow-Up: At [facility], you and your health needs are our priority.  As part of our continuing mission to provide you with exceptional heart care, our providers are all part of one team.  This team includes your primary Cardiologist (physician) and Advanced Practice Providers or APPs (Physician Assistants and Nurse Practitioners) who all work together to provide you with the care you need, when you need it.  Your next appointment:   As Needed  Provider:   Arnoldo Lapping, MD      1st Floor: - Lobby - Registration  - Pharmacy  - Lab - Cafe  2nd Floor: - PV Lab - Diagnostic Testing (echo, CT, nuclear med)  3rd Floor: - Vacant  4th Floor: - TCTS (cardiothoracic  surgery) - AFib Clinic - Structural Heart Clinic - Vascular Surgery  - Vascular Ultrasound  5th Floor: - HeartCare Cardiology (general and EP) - Clinical Pharmacy for coumadin, hypertension, lipid, weight-loss medications, and med management appointments    Valet parking services will be available as well.     Signed, Arnoldo Lapping, MD  03/17/2024 11:24 AM    [facility]

## 2024-03-17 ENCOUNTER — Encounter: Payer: Self-pay | Admitting: Cardiovascular Disease

## 2024-03-18 ENCOUNTER — Ambulatory Visit: Payer: Medicare Other | Admitting: Internal Medicine

## 2024-03-18 ENCOUNTER — Encounter: Payer: Self-pay | Admitting: Internal Medicine

## 2024-03-18 VITALS — BP 112/60 | HR 72 | Temp 98.5°F | Resp 16 | Ht 61.0 in | Wt 104.4 lb

## 2024-03-18 DIAGNOSIS — J9612 Chronic respiratory failure with hypercapnia: Secondary | ICD-10-CM

## 2024-03-18 DIAGNOSIS — A310 Pulmonary mycobacterial infection: Secondary | ICD-10-CM

## 2024-03-18 DIAGNOSIS — J4489 Other specified chronic obstructive pulmonary disease: Secondary | ICD-10-CM | POA: Diagnosis not present

## 2024-03-18 DIAGNOSIS — I350 Nonrheumatic aortic (valve) stenosis: Secondary | ICD-10-CM

## 2024-03-18 DIAGNOSIS — J9611 Chronic respiratory failure with hypoxia: Secondary | ICD-10-CM | POA: Diagnosis not present

## 2024-03-18 NOTE — Progress Notes (Signed)
 [facility] [address] [place], [place] [address]  Pulmonary Sleep Medicine   Office Visit Note  Patient Name: Breanna Christian DOB: 1941-03-13 MRN [id number]  Date of Service: 03/18/2024  Complaints/HPI: She is doing about the same. She has had a better appetite and she is maintaining her weight. She went to cardiology and is going to watched as far as the Aortic Stenosis. She has been using her oxygen . Seems to be helping her. She also has NIV and has been using nightly with good results. She is gradually increasing her time on the ventilator  Office Spirometry Results:     ROS  General: (-) fever, (-) chills, (-) night sweats, (-) weakness Skin: (-) rashes, (-) itching,. Eyes: (-) visual changes, (-) redness, (-) itching. Nose and Sinuses: (-) nasal stuffiness or itchiness, (-) postnasal drip, (-) nosebleeds, (-) sinus trouble. Mouth and Throat: (-) sore throat, (-) hoarseness. Neck: (-) swollen glands, (-) enlarged thyroid , (-) neck pain. Respiratory: + cough, (-) bloody sputum, + shortness of breath, + wheezing. Cardiovascular: - ankle swelling, (-) chest pain. Lymphatic: (-) lymph node enlargement. Neurologic: (-) numbness, (-) tingling. Psychiatric: (-) anxiety, (-) depression   Current Medication: Outpatient Encounter Medications as of 03/18/2024  Medication Sig   albuterol  (VENTOLIN  HFA) 108 (90 Base) MCG/ACT inhaler Inhale 2 puffs into the lungs every 6 (six) hours as needed for wheezing or shortness of breath.   ALPRAZolam  (XANAX ) 0.5 MG tablet Take half tab in am and one at night   ARTIFICIAL TEAR OP Place 1 drop into both eyes every 6 (six) hours as needed (dry eyes). A product from [place].   azithromycin  (ZITHROMAX ) 250 MG tablet Take 1 tablet (250 mg total) by mouth daily.   budeson-glycopyrrolate-formoterol  (BREZTRI  AEROSPHERE) 160-9-4.8 MCG/ACT AERO inhaler Inhale 2 puffs into the lungs 2 (two) times daily.   ethambutol  (MYAMBUTOL ) 400 MG  tablet Take 1 tablet (400 mg total) by mouth daily.   ibandronate  (BONIVA ) 150 MG tablet TAKE 1 TABLET (150 MG TOTAL) BY MOUTH EVERY 30 (THIRTY) DAYS. TAKE IN THE MORNING WITH A FULL GLASS OF WATER, ON AN EMPTY STOMACH, AND DO NOT TAKE ANYTHING ELSE BY MOUTH OR LIE DOWN FOR THE NEXT 30 MIN.   ibuprofen (ADVIL,MOTRIN) 200 MG tablet Take 200 mg by mouth as needed.   ipratropium-albuterol  (DUONEB) 0.5-2.5 (3) MG/3ML SOLN Take 3 mLs by nebulization every 2 (two) hours as needed. DX: J44.9   Lifitegrast 5 % SOLN Apply 1 drop to eye in the morning and at bedtime. 1 Gtt both eyes twice a day   mirtazapine  (REMERON ) 15 MG tablet Take 1 tablet (15 mg total) by mouth at bedtime.   Multiple Vitamin (MULTI-VITAMINS) TABS Take 1 tablet by mouth daily. Reported on 04/25/2016   Omega-3 Fatty Acids (OMEGA-3 FISH OIL) 300 MG CAPS Take by mouth daily.   rifampin  (RIFADIN ) 150 MG capsule TAKE 1 CAPSULE DAILY   sodium chloride  HYPERTONIC 3 % nebulizer solution Take by nebulization as needed for other.   No facility-administered encounter medications on file as of 03/18/2024.    Surgical History: Past Surgical History:  Procedure Laterality Date   APPENDECTOMY     BRONCHOSCOPY  02/2016   CATARACT EXTRACTION W/PHACO Right 10/26/2016   Procedure: CATARACT EXTRACTION PHACO AND INTRAOCULAR LENS PLACEMENT (IOC);  Surgeon: Steven Dingeldein, MD;  Location: [facility] ORS;  Service: Ophthalmology;  Laterality: Right;  US  2.17 AP% 26.5 CDE 56.86 Fluid pack lot # [id number]   FLEXIBLE BRONCHOSCOPY Bilateral 03/16/2016  Procedure: FLEXIBLE BRONCHOSCOPY;  Surgeon: Cordie Deters, MD;  Location: [facility] ORS;  Service: Pulmonary;  Laterality: Bilateral;   FLEXIBLE BRONCHOSCOPY N/A 03/03/2017   Procedure: FLEXIBLE BRONCHOSCOPY;  Surgeon: Cordie Deters, MD;  Location: [facility] ORS;  Service: Pulmonary;  Laterality: N/A;   TUBAL LIGATION      Medical History: Past Medical History:  Diagnosis Date   Bronchiectasis (HCC) 2011   COPD  (chronic obstructive pulmonary disease) (HCC)    HOH (hard of hearing)    Hypertension    Lung nodules    Mycobacterium avium complex colonization    Non-Hodgkin lymphoma (HCC) 2003   Personal history of chemotherapy    Pneumonia    FREQUENT IN PAST    H/O MYCOBACTERIAL   Rheumatoid arthritis (HCC)    Sjogren's syndrome (HCC)    Xerostomia     Family History: Family History  Problem Relation Age of Onset   Cancer Sister    Breast cancer Neg Hx     Social History: Social History   Socioeconomic History   Marital status: Married    Spouse name: Not on file   Number of children: Not on file   Years of education: Not on file   Highest education level: Not on file  Occupational History   Not on file  Tobacco Use   Smoking status: Never   Smokeless tobacco: Never  Vaping Use   Vaping status: Never Used  Substance and Sexual Activity   Alcohol use: No   Drug use: No   Sexual activity: Not on file  Other Topics Concern   Not on file  Social History Narrative   Not on file   Social Drivers of Health   Financial Resource Strain: Not on file  Food Insecurity: Not on file  Transportation Needs: Not on file  Physical Activity: Not on file  Stress: Not on file  Social Connections: Not on file  Intimate Partner Violence: Not on file    Vital Signs: Blood pressure 112/60, pulse 72, temperature 98.5 F (36.9 C), resp. rate 16, height 5\' 1"  (1.549 m), weight 104 lb 6.4 oz (47.4 kg), SpO2 96%.  Examination: General Appearance: The patient is well-developed, well-nourished, and in no distress. Skin: Gross inspection of skin unremarkable. Head: normocephalic, no gross deformities. Eyes: no gross deformities noted. ENT: ears appear grossly normal no exudates. Neck: Supple. No thyromegaly. No LAD. Respiratory: few rhonchin noted. Cardiovascular: Normal S1 and S2 without murmur or rub. Extremities: No cyanosis. pulses are equal. Neurologic: Alert and oriented. No  involuntary movements.  LABS: Recent Results (from the past 2160 hours)  ECHOCARDIOGRAM COMPLETE     Status: None   Collection Time: 02/14/24 12:24 PM  Result Value Ref Range   AR max vel 0.43 cm2   AV Peak grad 86.1 mmHg   Ao pk vel 4.64 m/s   Area-P 1/2 4.21 cm2   AV Area VTI 0.30 cm2   AV Mean grad 42.0 mmHg   AV Area mean vel 0.45 cm2   MV VTI 0.83 cm2   Est EF 60 - 65%     Radiology: CT CHEST WO CONTRAST Result Date: 11/28/2023 CLINICAL DATA:  Chronic cough.  Failed enteric treatment. EXAM: CT CHEST WITHOUT CONTRAST TECHNIQUE: Multidetector CT imaging of the chest was performed following the standard protocol without IV contrast. RADIATION DOSE REDUCTION: This exam was performed according to the departmental dose-optimization program which includes automated exposure control, adjustment of the mA and/or kV according to patient size  and/or use of iterative reconstruction technique. COMPARISON:  Chest CT dated 09/01/2023. FINDINGS: Evaluation of this exam is limited in the absence of intravenous contrast. Cardiovascular: There is no cardiomegaly or pericardial effusion. There is coronary vascular calcification and calcification of the mitral annulus. Mild atherosclerotic calcification of the thoracic aorta. No aneurysmal dilatation. The central pulmonary arteries are grossly unremarkable on this noncontrast CT. Mediastinum/Nodes: No obvious hilar adenopathy. Evaluation however is limited in the absence of intravenous contrast and due to consolidative changes of the right middle lobe. Mildly enlarged mediastinal lymph node anterior to the carina measures 16 mm in short axis. The esophagus is grossly unremarkable as visualized. No mediastinal fluid collection. Lungs/Pleura: Chronic complete consolidation of the right middle lobe with bronchiectasis and air bronchogram. There is mild bilateral lower lobe predominant bronchiectasis. Scattered bilateral pulmonary nodules slightly progressed since  the prior CT most consistent with pneumonia, likely atypical etiology such as MAI. There is a small right pleural effusion slightly increased since the prior CT. No pneumothorax. The central airways are patent. Upper Abdomen: No acute abnormality. Musculoskeletal: Osteopenia with degenerative changes. No acute osseous pathology. IMPRESSION: 1. Progression of bilateral pulmonary nodules most consistent with atypical pneumonia. 2. Chronic complete consolidation of the right middle lobe with bronchiectasis and air bronchogram. 3. Small right pleural effusion slightly increased since the prior CT. 4.  Aortic Atherosclerosis (ICD10-I70.0). Electronically Signed   By: Angus Bark M.D.   On: 11/28/2023 13:50    No results found.  No results found.  Assessment and Plan: Patient Active Problem List   Diagnosis Date Noted   Chronic respiratory failure with hypoxia (HCC) 11/28/2023   Encounter for general adult medical examination with abnormal findings 08/08/2020   Other fatigue 08/08/2020   Need for vaccination against Streptococcus pneumoniae using pneumococcal conjugate vaccine 7 08/08/2020   Age related osteoporosis 10/15/2018   Ovarian failure 10/15/2018   Screening for breast cancer 04/29/2018   Essential hypertension 04/29/2018   Obstructive chronic bronchitis without exacerbation (HCC) 04/29/2018   MAI (mycobacterium avium-intracellulare) infection (HCC) 02/08/2018   Numbness and tingling 03/25/2015   Myopathy 02/19/2015   Polyneuropathy 02/18/2015   Weakness of both lower extremities 02/18/2015    1. Chronic respiratory failure with hypoxia and hypercapnia (HCC) (Primary)  Will continue with oxygen  therapy encourage compliance seen use to continue to use the oxygen  24 hours a day.  2. Obstructive chronic bronchitis without exacerbation [facility])   Medical management with inhalers patient does have albuterol  as well as by respiratory  3. MAI (mycobacterium avium-intracellulare)  infection (HCC)  She has been on treatment appears to be under control  4. Aortic stenosis, severe  Follow-up with the primary care in cardiac    General Counseling: I have discussed the findings of the evaluation and examination with Judit.  I have also discussed any further diagnostic evaluation thatmay be needed or ordered today. Nonnie verbalizes understanding of the findings of todays visit. We also reviewed her medications today and discussed drug interactions and side effects including but not limited excessive drowsiness and altered mental states. We also discussed that there is always a risk not just to her but also people around her. she has been encouraged to call the office with any questions or concerns that should arise related to todays visit.  No orders of the defined types were placed in this encounter.    Time spent: 59  I have personally obtained a history, examined the patient, evaluated laboratory and imaging results, formulated the assessment  and plan and placed orders.    Cordie Deters, MD [facility] Pulmonary and Critical Care Sleep medicine

## 2024-03-18 NOTE — Patient Instructions (Signed)

## 2024-03-19 DIAGNOSIS — M069 Rheumatoid arthritis, unspecified: Secondary | ICD-10-CM | POA: Diagnosis not present

## 2024-03-19 DIAGNOSIS — M3500 Sicca syndrome, unspecified: Secondary | ICD-10-CM | POA: Diagnosis not present

## 2024-03-19 DIAGNOSIS — J9612 Chronic respiratory failure with hypercapnia: Secondary | ICD-10-CM | POA: Diagnosis not present

## 2024-03-19 DIAGNOSIS — J9611 Chronic respiratory failure with hypoxia: Secondary | ICD-10-CM | POA: Diagnosis not present

## 2024-03-19 DIAGNOSIS — J479 Bronchiectasis, uncomplicated: Secondary | ICD-10-CM | POA: Diagnosis not present

## 2024-03-19 DIAGNOSIS — A310 Pulmonary mycobacterial infection: Secondary | ICD-10-CM | POA: Diagnosis not present

## 2024-03-27 DIAGNOSIS — M3500 Sicca syndrome, unspecified: Secondary | ICD-10-CM | POA: Diagnosis not present

## 2024-03-27 DIAGNOSIS — J9611 Chronic respiratory failure with hypoxia: Secondary | ICD-10-CM | POA: Diagnosis not present

## 2024-03-27 DIAGNOSIS — J9612 Chronic respiratory failure with hypercapnia: Secondary | ICD-10-CM | POA: Diagnosis not present

## 2024-03-27 DIAGNOSIS — M069 Rheumatoid arthritis, unspecified: Secondary | ICD-10-CM | POA: Diagnosis not present

## 2024-03-27 DIAGNOSIS — A310 Pulmonary mycobacterial infection: Secondary | ICD-10-CM | POA: Diagnosis not present

## 2024-03-27 DIAGNOSIS — J479 Bronchiectasis, uncomplicated: Secondary | ICD-10-CM | POA: Diagnosis not present

## 2024-04-02 DIAGNOSIS — A310 Pulmonary mycobacterial infection: Secondary | ICD-10-CM | POA: Diagnosis not present

## 2024-04-02 DIAGNOSIS — M3500 Sicca syndrome, unspecified: Secondary | ICD-10-CM | POA: Diagnosis not present

## 2024-04-02 DIAGNOSIS — J9611 Chronic respiratory failure with hypoxia: Secondary | ICD-10-CM | POA: Diagnosis not present

## 2024-04-02 DIAGNOSIS — M069 Rheumatoid arthritis, unspecified: Secondary | ICD-10-CM | POA: Diagnosis not present

## 2024-04-02 DIAGNOSIS — J479 Bronchiectasis, uncomplicated: Secondary | ICD-10-CM | POA: Diagnosis not present

## 2024-04-02 DIAGNOSIS — J9612 Chronic respiratory failure with hypercapnia: Secondary | ICD-10-CM | POA: Diagnosis not present

## 2024-04-04 ENCOUNTER — Encounter: Payer: Self-pay | Admitting: Internal Medicine

## 2024-04-05 NOTE — Telephone Encounter (Signed)
 Spoke with pt son and as per Breanna Christian that ok to take xanax  and mirtazapine  together advised him not to take together take break 1 hrs and take half xanax   be careful its make her drowsy

## 2024-04-09 DIAGNOSIS — M069 Rheumatoid arthritis, unspecified: Secondary | ICD-10-CM | POA: Diagnosis not present

## 2024-04-09 DIAGNOSIS — M3500 Sicca syndrome, unspecified: Secondary | ICD-10-CM | POA: Diagnosis not present

## 2024-04-09 DIAGNOSIS — J9611 Chronic respiratory failure with hypoxia: Secondary | ICD-10-CM | POA: Diagnosis not present

## 2024-04-09 DIAGNOSIS — J479 Bronchiectasis, uncomplicated: Secondary | ICD-10-CM | POA: Diagnosis not present

## 2024-04-09 DIAGNOSIS — A310 Pulmonary mycobacterial infection: Secondary | ICD-10-CM | POA: Diagnosis not present

## 2024-04-09 DIAGNOSIS — J9612 Chronic respiratory failure with hypercapnia: Secondary | ICD-10-CM | POA: Diagnosis not present

## 2024-04-12 DIAGNOSIS — G479 Sleep disorder, unspecified: Secondary | ICD-10-CM | POA: Diagnosis not present

## 2024-04-12 DIAGNOSIS — A310 Pulmonary mycobacterial infection: Secondary | ICD-10-CM | POA: Diagnosis not present

## 2024-04-12 DIAGNOSIS — K117 Disturbances of salivary secretion: Secondary | ICD-10-CM | POA: Diagnosis not present

## 2024-04-12 DIAGNOSIS — H9193 Unspecified hearing loss, bilateral: Secondary | ICD-10-CM | POA: Diagnosis not present

## 2024-04-12 DIAGNOSIS — G629 Polyneuropathy, unspecified: Secondary | ICD-10-CM | POA: Diagnosis not present

## 2024-04-12 DIAGNOSIS — Z87440 Personal history of urinary (tract) infections: Secondary | ICD-10-CM | POA: Diagnosis not present

## 2024-04-12 DIAGNOSIS — M810 Age-related osteoporosis without current pathological fracture: Secondary | ICD-10-CM | POA: Diagnosis not present

## 2024-04-12 DIAGNOSIS — J9611 Chronic respiratory failure with hypoxia: Secondary | ICD-10-CM | POA: Diagnosis not present

## 2024-04-12 DIAGNOSIS — Z7951 Long term (current) use of inhaled steroids: Secondary | ICD-10-CM | POA: Diagnosis not present

## 2024-04-12 DIAGNOSIS — Z8572 Personal history of non-Hodgkin lymphomas: Secondary | ICD-10-CM | POA: Diagnosis not present

## 2024-04-12 DIAGNOSIS — I10 Essential (primary) hypertension: Secondary | ICD-10-CM | POA: Diagnosis not present

## 2024-04-12 DIAGNOSIS — J9612 Chronic respiratory failure with hypercapnia: Secondary | ICD-10-CM | POA: Diagnosis not present

## 2024-04-12 DIAGNOSIS — M3500 Sicca syndrome, unspecified: Secondary | ICD-10-CM | POA: Diagnosis not present

## 2024-04-12 DIAGNOSIS — Z8701 Personal history of pneumonia (recurrent): Secondary | ICD-10-CM | POA: Diagnosis not present

## 2024-04-12 DIAGNOSIS — Z9981 Dependence on supplemental oxygen: Secondary | ICD-10-CM | POA: Diagnosis not present

## 2024-04-12 DIAGNOSIS — M069 Rheumatoid arthritis, unspecified: Secondary | ICD-10-CM | POA: Diagnosis not present

## 2024-04-12 DIAGNOSIS — G729 Myopathy, unspecified: Secondary | ICD-10-CM | POA: Diagnosis not present

## 2024-04-12 DIAGNOSIS — F411 Generalized anxiety disorder: Secondary | ICD-10-CM | POA: Diagnosis not present

## 2024-04-12 DIAGNOSIS — Z556 Problems related to health literacy: Secondary | ICD-10-CM | POA: Diagnosis not present

## 2024-04-12 DIAGNOSIS — J479 Bronchiectasis, uncomplicated: Secondary | ICD-10-CM | POA: Diagnosis not present

## 2024-04-12 DIAGNOSIS — J449 Chronic obstructive pulmonary disease, unspecified: Secondary | ICD-10-CM | POA: Diagnosis not present

## 2024-04-16 ENCOUNTER — Telehealth: Payer: Self-pay | Admitting: Internal Medicine

## 2024-04-16 DIAGNOSIS — J9612 Chronic respiratory failure with hypercapnia: Secondary | ICD-10-CM | POA: Diagnosis not present

## 2024-04-16 DIAGNOSIS — M069 Rheumatoid arthritis, unspecified: Secondary | ICD-10-CM | POA: Diagnosis not present

## 2024-04-16 DIAGNOSIS — J479 Bronchiectasis, uncomplicated: Secondary | ICD-10-CM | POA: Diagnosis not present

## 2024-04-16 DIAGNOSIS — A310 Pulmonary mycobacterial infection: Secondary | ICD-10-CM | POA: Diagnosis not present

## 2024-04-16 DIAGNOSIS — M3500 Sicca syndrome, unspecified: Secondary | ICD-10-CM | POA: Diagnosis not present

## 2024-04-16 DIAGNOSIS — J9611 Chronic respiratory failure with hypoxia: Secondary | ICD-10-CM | POA: Diagnosis not present

## 2024-04-16 NOTE — Telephone Encounter (Signed)
 Received vm from [place] w/ [facility] requesting verbal order for home PT; 1 week 1, 2 week 2 & 1 week 5. Returned call with verbal order per Alyssa-Toni

## 2024-04-23 DIAGNOSIS — M3500 Sicca syndrome, unspecified: Secondary | ICD-10-CM | POA: Diagnosis not present

## 2024-04-23 DIAGNOSIS — M069 Rheumatoid arthritis, unspecified: Secondary | ICD-10-CM | POA: Diagnosis not present

## 2024-04-23 DIAGNOSIS — J9612 Chronic respiratory failure with hypercapnia: Secondary | ICD-10-CM | POA: Diagnosis not present

## 2024-04-23 DIAGNOSIS — J479 Bronchiectasis, uncomplicated: Secondary | ICD-10-CM | POA: Diagnosis not present

## 2024-04-23 DIAGNOSIS — J9611 Chronic respiratory failure with hypoxia: Secondary | ICD-10-CM | POA: Diagnosis not present

## 2024-04-23 DIAGNOSIS — A310 Pulmonary mycobacterial infection: Secondary | ICD-10-CM | POA: Diagnosis not present

## 2024-04-26 DIAGNOSIS — M3500 Sicca syndrome, unspecified: Secondary | ICD-10-CM | POA: Diagnosis not present

## 2024-04-26 DIAGNOSIS — J9611 Chronic respiratory failure with hypoxia: Secondary | ICD-10-CM | POA: Diagnosis not present

## 2024-04-26 DIAGNOSIS — A310 Pulmonary mycobacterial infection: Secondary | ICD-10-CM | POA: Diagnosis not present

## 2024-04-26 DIAGNOSIS — J9612 Chronic respiratory failure with hypercapnia: Secondary | ICD-10-CM | POA: Diagnosis not present

## 2024-04-26 DIAGNOSIS — M069 Rheumatoid arthritis, unspecified: Secondary | ICD-10-CM | POA: Diagnosis not present

## 2024-04-26 DIAGNOSIS — J479 Bronchiectasis, uncomplicated: Secondary | ICD-10-CM | POA: Diagnosis not present

## 2024-04-30 DIAGNOSIS — J9611 Chronic respiratory failure with hypoxia: Secondary | ICD-10-CM | POA: Diagnosis not present

## 2024-04-30 DIAGNOSIS — M069 Rheumatoid arthritis, unspecified: Secondary | ICD-10-CM | POA: Diagnosis not present

## 2024-04-30 DIAGNOSIS — M3500 Sicca syndrome, unspecified: Secondary | ICD-10-CM | POA: Diagnosis not present

## 2024-04-30 DIAGNOSIS — J479 Bronchiectasis, uncomplicated: Secondary | ICD-10-CM | POA: Diagnosis not present

## 2024-04-30 DIAGNOSIS — J9612 Chronic respiratory failure with hypercapnia: Secondary | ICD-10-CM | POA: Diagnosis not present

## 2024-04-30 DIAGNOSIS — A310 Pulmonary mycobacterial infection: Secondary | ICD-10-CM | POA: Diagnosis not present

## 2024-05-02 DIAGNOSIS — J9611 Chronic respiratory failure with hypoxia: Secondary | ICD-10-CM | POA: Diagnosis not present

## 2024-05-02 DIAGNOSIS — J479 Bronchiectasis, uncomplicated: Secondary | ICD-10-CM | POA: Diagnosis not present

## 2024-05-02 DIAGNOSIS — M069 Rheumatoid arthritis, unspecified: Secondary | ICD-10-CM | POA: Diagnosis not present

## 2024-05-02 DIAGNOSIS — A310 Pulmonary mycobacterial infection: Secondary | ICD-10-CM | POA: Diagnosis not present

## 2024-05-02 DIAGNOSIS — J9612 Chronic respiratory failure with hypercapnia: Secondary | ICD-10-CM | POA: Diagnosis not present

## 2024-05-02 DIAGNOSIS — M3500 Sicca syndrome, unspecified: Secondary | ICD-10-CM | POA: Diagnosis not present

## 2024-05-10 ENCOUNTER — Telehealth: Payer: Self-pay | Admitting: Internal Medicine

## 2024-05-10 ENCOUNTER — Inpatient Hospital Stay
Admission: EM | Admit: 2024-05-10 | Discharge: 2024-05-14 | DRG: 292 | Disposition: A | Discharge status: Home Health | Attending: Hospitalist | Admitting: Hospitalist

## 2024-05-10 ENCOUNTER — Other Ambulatory Visit: Payer: Self-pay

## 2024-05-10 ENCOUNTER — Emergency Department

## 2024-05-10 DIAGNOSIS — Z9981 Dependence on supplemental oxygen: Secondary | ICD-10-CM | POA: Diagnosis not present

## 2024-05-10 DIAGNOSIS — I10 Essential (primary) hypertension: Secondary | ICD-10-CM | POA: Diagnosis not present

## 2024-05-10 DIAGNOSIS — J470 Bronchiectasis with acute lower respiratory infection: Secondary | ICD-10-CM | POA: Diagnosis not present

## 2024-05-10 DIAGNOSIS — J449 Chronic obstructive pulmonary disease, unspecified: Secondary | ICD-10-CM | POA: Diagnosis present

## 2024-05-10 DIAGNOSIS — H9190 Unspecified hearing loss, unspecified ear: Secondary | ICD-10-CM | POA: Diagnosis present

## 2024-05-10 DIAGNOSIS — J441 Chronic obstructive pulmonary disease with (acute) exacerbation: Secondary | ICD-10-CM | POA: Diagnosis not present

## 2024-05-10 DIAGNOSIS — Z515 Encounter for palliative care: Secondary | ICD-10-CM | POA: Diagnosis not present

## 2024-05-10 DIAGNOSIS — I5033 Acute on chronic diastolic (congestive) heart failure: Secondary | ICD-10-CM | POA: Diagnosis present

## 2024-05-10 DIAGNOSIS — Z66 Do not resuscitate: Secondary | ICD-10-CM | POA: Diagnosis present

## 2024-05-10 DIAGNOSIS — Z681 Body mass index (BMI) 19 or less, adult: Secondary | ICD-10-CM

## 2024-05-10 DIAGNOSIS — F419 Anxiety disorder, unspecified: Secondary | ICD-10-CM | POA: Diagnosis present

## 2024-05-10 DIAGNOSIS — R0602 Shortness of breath: Principal | ICD-10-CM

## 2024-05-10 DIAGNOSIS — R54 Age-related physical debility: Secondary | ICD-10-CM | POA: Diagnosis present

## 2024-05-10 DIAGNOSIS — H04123 Dry eye syndrome of bilateral lacrimal glands: Secondary | ICD-10-CM | POA: Diagnosis present

## 2024-05-10 DIAGNOSIS — J9611 Chronic respiratory failure with hypoxia: Secondary | ICD-10-CM | POA: Diagnosis present

## 2024-05-10 DIAGNOSIS — E876 Hypokalemia: Secondary | ICD-10-CM | POA: Diagnosis not present

## 2024-05-10 DIAGNOSIS — J9612 Chronic respiratory failure with hypercapnia: Secondary | ICD-10-CM | POA: Diagnosis not present

## 2024-05-10 DIAGNOSIS — I350 Nonrheumatic aortic (valve) stenosis: Secondary | ICD-10-CM | POA: Diagnosis present

## 2024-05-10 DIAGNOSIS — I3481 Nonrheumatic mitral (valve) annulus calcification: Secondary | ICD-10-CM | POA: Diagnosis not present

## 2024-05-10 DIAGNOSIS — M069 Rheumatoid arthritis, unspecified: Secondary | ICD-10-CM | POA: Diagnosis not present

## 2024-05-10 DIAGNOSIS — Z923 Personal history of irradiation: Secondary | ICD-10-CM

## 2024-05-10 DIAGNOSIS — Z79899 Other long term (current) drug therapy: Secondary | ICD-10-CM

## 2024-05-10 DIAGNOSIS — I4510 Unspecified right bundle-branch block: Secondary | ICD-10-CM | POA: Diagnosis present

## 2024-05-10 DIAGNOSIS — J479 Bronchiectasis, uncomplicated: Secondary | ICD-10-CM | POA: Diagnosis not present

## 2024-05-10 DIAGNOSIS — M3500 Sicca syndrome, unspecified: Secondary | ICD-10-CM | POA: Diagnosis not present

## 2024-05-10 DIAGNOSIS — Z888 Allergy status to other drugs, medicaments and biological substances status: Secondary | ICD-10-CM

## 2024-05-10 DIAGNOSIS — A310 Pulmonary mycobacterial infection: Secondary | ICD-10-CM | POA: Diagnosis present

## 2024-05-10 DIAGNOSIS — R682 Dry mouth, unspecified: Secondary | ICD-10-CM | POA: Diagnosis present

## 2024-05-10 DIAGNOSIS — Z8572 Personal history of non-Hodgkin lymphomas: Secondary | ICD-10-CM | POA: Diagnosis not present

## 2024-05-10 DIAGNOSIS — D649 Anemia, unspecified: Secondary | ICD-10-CM | POA: Diagnosis present

## 2024-05-10 DIAGNOSIS — R000 Tachycardia, unspecified: Secondary | ICD-10-CM | POA: Diagnosis not present

## 2024-05-10 DIAGNOSIS — Z9221 Personal history of antineoplastic chemotherapy: Secondary | ICD-10-CM | POA: Diagnosis not present

## 2024-05-10 DIAGNOSIS — R600 Localized edema: Secondary | ICD-10-CM | POA: Diagnosis not present

## 2024-05-10 DIAGNOSIS — I110 Hypertensive heart disease with heart failure: Principal | ICD-10-CM | POA: Diagnosis present

## 2024-05-10 DIAGNOSIS — R609 Edema, unspecified: Secondary | ICD-10-CM

## 2024-05-10 LAB — CBC
HCT: 34.3 % — ABNORMAL LOW (ref 36.0–46.0)
Hemoglobin: 10.6 g/dL — ABNORMAL LOW (ref 12.0–15.0)
MCH: 26 pg (ref 26.0–34.0)
MCHC: 30.9 g/dL (ref 30.0–36.0)
MCV: 84.1 fL (ref 80.0–100.0)
Platelets: 184 10*3/uL (ref 150–400)
RBC: 4.08 MIL/uL (ref 3.87–5.11)
RDW: 13.3 % (ref 11.5–15.5)
WBC: 4.8 10*3/uL (ref 4.0–10.5)
nRBC: 0 % (ref 0.0–0.2)

## 2024-05-10 LAB — TROPONIN I (HIGH SENSITIVITY)
Troponin I (High Sensitivity): 19 ng/L — ABNORMAL HIGH (ref <18)
Troponin I (High Sensitivity): 20 ng/L — ABNORMAL HIGH (ref <18)

## 2024-05-10 LAB — COMPREHENSIVE METABOLIC PANEL WITH GFR
ALT: 19 U/L (ref 0–44)
AST: 30 U/L (ref 15–41)
Albumin: 3.3 g/dL — ABNORMAL LOW (ref 3.5–5.0)
Alkaline Phosphatase: 53 U/L (ref 38–126)
Anion gap: 9 (ref 5–15)
BUN: 15 mg/dL (ref 8–23)
CO2: 37 mmol/L — ABNORMAL HIGH (ref 22–32)
Calcium: 8.5 mg/dL — ABNORMAL LOW (ref 8.9–10.3)
Chloride: 90 mmol/L — ABNORMAL LOW (ref 98–111)
Creatinine, Ser: 0.3 mg/dL — ABNORMAL LOW (ref 0.44–1.00)
GFR, Estimated: >60 mL/min (ref >60)
Glucose, Bld: 87 mg/dL (ref 70–99)
Potassium: 4.5 mmol/L (ref 3.5–5.1)
Sodium: 136 mmol/L (ref 135–145)
Total Bilirubin: 0.8 mg/dL (ref 0.0–1.2)
Total Protein: 6.2 g/dL — ABNORMAL LOW (ref 6.5–8.1)

## 2024-05-10 LAB — BRAIN NATRIURETIC PEPTIDE: B Natriuretic Peptide: 347.4 pg/mL — ABNORMAL HIGH (ref 0.0–100.0)

## 2024-05-10 MED ORDER — IPRATROPIUM-ALBUTEROL 0.5-2.5 (3) MG/3ML IN SOLN
9.0000 mL | Freq: Once | RESPIRATORY_TRACT | Status: AC
  Administered 2024-05-10: 9 mL via RESPIRATORY_TRACT
  Filled 2024-05-10: qty 9

## 2024-05-10 MED ORDER — SODIUM CHLORIDE 0.9 % IV SOLN
500.0000 mg | Freq: Once | INTRAVENOUS | Status: AC
  Administered 2024-05-10: 500 mg via INTRAVENOUS
  Filled 2024-05-10: qty 5

## 2024-05-10 MED ORDER — SODIUM CHLORIDE 0.9 % IV SOLN
2.0000 g | Freq: Once | INTRAVENOUS | Status: AC
  Administered 2024-05-10: 2 g via INTRAVENOUS
  Filled 2024-05-10: qty 12.5

## 2024-05-10 MED ORDER — FUROSEMIDE 10 MG/ML IJ SOLN
20.0000 mg | Freq: Once | INTRAMUSCULAR | Status: AC
  Administered 2024-05-10: 20 mg via INTRAVENOUS
  Filled 2024-05-10: qty 4

## 2024-05-10 NOTE — Consult Note (Signed)
 ED Pharmacy Antibiotic Sign Off An antibiotic consult was received from an ED provider for cefepime per pharmacy dosing for pneumonia. A chart review was completed to assess appropriateness.   The following one time order(s) were placed:  Cefepime 2 grams IV x 1  Further antibiotic and/or antibiotic pharmacy consults should be ordered by the admitting provider if indicated.   Thank you for allowing pharmacy to be a part of this patient's care.   Ramonita Burow, [facility]  Clinical Pharmacist 05/10/24 5:44 PM

## 2024-05-10 NOTE — Assessment & Plan Note (Signed)
 BP normal.  Not on medications any more at home - Diuresis

## 2024-05-10 NOTE — H&P (Signed)
 History and Physical    Patient: Breanna Christian WUJ:[id number] DOB: 23-Sep-1941 DOA: 05/10/2024 DOS: the patient was seen and examined on 05/10/2024 PCP: Lawton Price, MD  Patient coming from: Home  Chief Complaint:  Chief Complaint  Patient presents with   Shortness of Breath       HPI:  83 y.o. F with severe aortic stenosis, COPD, Sjogren's, bronchiectasis, on 2L home O2, hx NHL s/p chemoXRT and hx MAI who presented with increased leg swelling and SOB.  Increased swelling started few weeks ago but was okay until today, she was significantly more SOB than baseline.    In the ER, CXR showed chronic right base opacity.  Hgb 10 but at baseline.  Cr stable.  Given antibiotics for possible pneumonia and COPD exacerbation, and the hospitalist service was asked to evaluate.   Of note, she was seen last April by [facility] re: TAVR, but this was deferred given her advanced lung disease, high risk to benefit ratio.      Review of Systems  Constitutional:  Negative for fever and malaise/fatigue.  Respiratory:  Positive for shortness of breath and wheezing. Negative for cough and sputum production.   Cardiovascular:  Positive for orthopnea and leg swelling. Negative for chest pain and palpitations.  All other systems reviewed and are negative.    Past Medical History:  Diagnosis Date   Bronchiectasis (HCC) 2011   COPD (chronic obstructive pulmonary disease) (HCC)    HOH (hard of hearing)    Hypertension    Lung nodules    Mycobacterium avium complex colonization    Non-Hodgkin lymphoma (HCC) 2003   Personal history of chemotherapy    Pneumonia    FREQUENT IN PAST    H/O MYCOBACTERIAL   Rheumatoid arthritis (HCC)    Sjogren's syndrome (HCC)    Xerostomia    Past Surgical History:  Procedure Laterality Date   APPENDECTOMY     BRONCHOSCOPY  02/2016   CATARACT EXTRACTION W/PHACO Right 10/26/2016   Procedure: CATARACT EXTRACTION PHACO AND INTRAOCULAR LENS PLACEMENT  (IOC);  Surgeon: Steven Dingeldein, MD;  Location: [facility] ORS;  Service: Ophthalmology;  Laterality: Right;  US  2.17 AP% 26.5 CDE 56.86 Fluid pack lot # [id number]   FLEXIBLE BRONCHOSCOPY Bilateral 03/16/2016   Procedure: FLEXIBLE BRONCHOSCOPY;  Surgeon: Cordie Deters, MD;  Location: [facility] ORS;  Service: Pulmonary;  Laterality: Bilateral;   FLEXIBLE BRONCHOSCOPY N/A 03/03/2017   Procedure: FLEXIBLE BRONCHOSCOPY;  Surgeon: Cordie Deters, MD;  Location: [facility] ORS;  Service: Pulmonary;  Laterality: N/A;   TUBAL LIGATION     Social History:  reports that she has never smoked. She has never used smokeless tobacco. She reports that she does not drink alcohol and does not use drugs.  Allergies  Allergen Reactions   Prednisone  Other (See Comments)    Swells my gums but pt still takes it if needed    Family History  Problem Relation Age of Onset   Cancer Sister    Breast cancer Neg Hx     Prior to Admission medications   Medication Sig Start Date End Date Taking? Authorizing Provider  albuterol  (VENTOLIN  HFA) 108 (90 Base) MCG/ACT inhaler Inhale 2 puffs into the lungs every 6 (six) hours as needed for wheezing or shortness of breath. Patient taking differently: Inhale 2 puffs into the lungs 3 (three) times daily. 11/27/23  Yes Abernathy, Janeice Medal, NP  ALPRAZolam  (XANAX ) 0.25 MG tablet Take 0.25 mg by mouth 3 (three) times daily as needed. 01/11/24  Yes [provider]  ALPRAZolam  (XANAX ) 0.5 MG tablet Take half tab in am and one at night 01/02/24  Yes Khan, Fozia M, MD  ARTIFICIAL TEAR OP Place 1 drop into both eyes every 6 (six) hours as needed (dry eyes). A product from [place].   Yes [provider]  azithromycin  (ZITHROMAX ) 250 MG tablet Take 1 tablet (250 mg total) by mouth daily. 03/05/24  Yes Khan, Fozia M, MD  budeson-glycopyrrolate-formoterol  (BREZTRI  AEROSPHERE) 160-9-4.8 MCG/ACT AERO inhaler Inhale 2 puffs into the lungs 2 (two) times daily. 03/05/24  Yes Khan, Fozia M, MD   ethambutol  (MYAMBUTOL ) 400 MG tablet Take 1 tablet (400 mg total) by mouth daily. 03/05/24  Yes Khan, Fozia M, MD  ibandronate  (BONIVA ) 150 MG tablet TAKE 1 TABLET (150 MG TOTAL) BY MOUTH EVERY 30 (THIRTY) DAYS. TAKE IN THE MORNING WITH A FULL GLASS OF WATER, ON AN EMPTY STOMACH, AND DO NOT TAKE ANYTHING ELSE BY MOUTH OR LIE DOWN FOR THE NEXT 30 MIN. 02/12/24  Yes Khan, Fozia M, MD  ibuprofen (ADVIL,MOTRIN) 200 MG tablet Take 200 mg by mouth as needed.   Yes [provider]  ipratropium-albuterol  (DUONEB) 0.5-2.5 (3) MG/3ML SOLN Take 3 mLs by nebulization every 2 (two) hours as needed. DX: J44.9 12/12/23  Yes Khan, Fozia M, MD  Lifitegrast 5 % SOLN Apply 1 drop to eye in the morning and at bedtime. 1 Gtt both eyes twice a day   Yes [provider]  mirtazapine  (REMERON ) 7.5 MG tablet Take 7.5 mg by mouth at bedtime. 05/03/24  Yes [provider]  Multiple Vitamin (MULTI-VITAMINS) TABS Take 1 tablet by mouth daily. Reported on 04/25/2016   Yes [provider]  Omega-3 Fatty Acids (OMEGA-3 FISH OIL) 300 MG CAPS Take by mouth daily.   Yes [provider]  rifampin  (RIFADIN ) 150 MG capsule TAKE 1 CAPSULE DAILY 02/20/24  Yes Khan, Fozia M, MD  sodium chloride  HYPERTONIC 3 % nebulizer solution Take by nebulization as needed for other. 11/30/20  Yes Cordie Deters, MD    Physical Exam: Vitals:   05/10/24 1429 05/10/24 1521  BP:  130/73  Pulse: (!) 101 99  Resp:  20  Temp:  98.3 F (36.8 C)  TempSrc:  Oral  SpO2: 100% 100%   Frail elderly female, lying in bed, weak, tired, sleepy, but opens eyes in response to questions Small amount of dried blood in the right nares, otherwise eyes and nose are normal. Tachycardic, regular, harsh systolic murmur, up to the carotids, 1+ pitting edema to the shins, JVP not visible Respiratory rate seems increased, she is using some accessory muscles and talking somewhat, respiratory rate seems comfortable at rest but with any  exertion she is tachypneic, lung sounds diminished, she has some wheezing, very poor air movement, no rales appreciated Abdomen soft, no tenderness palpation or guarding Attention normal, she speaks [place] but is hard of hearing, she has generalized weakness but symmetric strength, face symmetric, oriented to person, place, and time    Data Reviewed: Basic metabolic panel notable for normal electrolytes and renal function, LFTs normal BNP elevated at 347 Troponin low and flat, ischemia ruled out, this is myocardial injury due to lung disease or aortic stenosis CBC shows mild anemia Chest x-ray shows possible lingular pneumonia superimposed on chronic scarring, personally reviewed, chronic scarring noted ECG shows sinus tachycardia, rightward axis, personally reviewed    Assessment and Plan: * Acute on chronic diastolic CHF (congestive heart failure) (HCC) Severe aortic  stenosis Echo last Mar with normal EF, severe AS.  Saw Cardiology last April, not a candidate for TAVR given advanced lung disease, high risk to low benefit given her lung disease.    Unclear if her dyspnea now is primarily from CHF/aortic stenosis (more likely) or pneumonia/COPD/bronchiectasis.  Swelling and exertional symptoms suggest the former, but I am sure how much Lasix will improve this.  I did have a frank conversation with daughter and son, that if Lasix, as well as empiric treatment of pneumonia didn't improve symptoms, we should pivot to comfort and transition home with Hospice. - Continue IV lasix - Consult Cardiology - Consult Palliative care      COPD with acute exacerbation (HCC) Possible pneumonia CXR shows some possible acute new infiltrates on chronic scarring, consolidation.   - Continue cefepime - Continue steroids - Scheduled bronchodilators - Chest physiotherapy    Chronic respiratory failure with hypoxia (HCC) On 2L home O2.    Essential hypertension BP normal.  Not on medications  any more at home - Diuresis   MAI (mycobacterium avium-intracellulare) infection (HCC) On longterm treatment with Dr. Meredeth Stallion - Continue azithromycin , ethambutol , rifampin    Normocytic anemia Hgb stable, no clinical bleeding reported or observed  Sjogren syndrome (HCC) Diuretics for CHF had previously been held because of her dry eyes and dry mouth.       Advance Care Planning: DNR< confirmed with son  Consults: Cardiology, message sent by Secure chat Palliative care  Family Communication: Daughter at the bedside, son by phone  Severity of Illness: The appropriate patient status for this patient is INPATIENT. Inpatient status is judged to be reasonable and necessary in order to provide the required intensity of service to ensure the patient's safety. The patient's presenting symptoms, physical exam findings, and initial radiographic and laboratory data in the context of their chronic comorbidities is felt to place them at high risk for further clinical deterioration. Furthermore, it is not anticipated that the patient will be medically stable for discharge from the hospital within 2 midnights of admission.   * I certify that at the point of admission it is my clinical judgment that the patient will require inpatient hospital care spanning beyond 2 midnights from the point of admission due to high intensity of service, high risk for further deterioration and high frequency of surveillance required.*  Author: Ephriam Hashimoto, MD 05/10/2024 7:54 PM  For on call review [email or web address].

## 2024-05-10 NOTE — Assessment & Plan Note (Addendum)
 Echo last Mar with normal EF, severe AS.  Saw Cardiology last April, not a candidate for TAVR given advanced lung disease, high risk to low benefit given her lung disease.    Unclear if her dyspnea now is primarily from CHF/aortic stenosis (more likely) or pneumonia/COPD/bronchiectasis.  Swelling and exertional symptoms suggest the former, but I am sure how much Lasix will improve this.  I did have a frank conversation with daughter and son, that if Lasix, as well as empiric treatment of pneumonia didn't improve symptoms, we should pivot to comfort and transition home with Hospice. - Continue IV lasix - Consult Cardiology - Consult Palliative care

## 2024-05-10 NOTE — Assessment & Plan Note (Signed)
 Possible pneumonia CXR shows some possible acute new infiltrates on chronic scarring, consolidation.   - Continue cefepime - Continue steroids - Scheduled bronchodilators - Chest physiotherapy

## 2024-05-10 NOTE — ED Provider Notes (Signed)
 [facility] Provider Note    Event Date/Time   First MD Initiated Contact with Patient 05/10/24 1504     (approximate)   History   Shortness of Breath From translating service utilized HPI  Breanna Christian is a 83 y.o. female past medical history significant for severe aortic stenosis, COPD, history non-Hodgkin's lymphoma, who presents to the emergency department shortness of breath.  Patient states that she baseline has difficulties with breathing secondary to her shortness of breath with COPD.  States that over the past day and specifically worsened this morning had worsening shortness of breath.  Normally able to ambulate 5 or 6 laps around her house wearing her home 2 L of oxygen , today unable to complete 1 lap secondary to shortness of breath.  Received DuoNeb treatment prior to arrival and does have some improvement of symptoms.  Denies any history of heart failure.  States that she started having edema to her lower extremity approximately 3 weeks ago.  No history of PE or DVT.  Followed by cardiology.  Also followed by pulmonology.  No new cough or sputum production.  Patient has a history of COPD and received IV Solu-Medrol and DuoNebs prior to arrival.  On review of last visit with pulmonology was not a good candidate for severe aortic stenosis repair given her severe COPD.     Physical Exam   Triage Vital Signs: ED Triage Vitals  Encounter Vitals Group     BP --      Girls Systolic BP Percentile --      Girls Diastolic BP Percentile --      Boys Systolic BP Percentile --      Boys Diastolic BP Percentile --      Pulse Rate 05/10/24 1429 (!) 101     Resp --      Temp --      Temp src --      SpO2 05/10/24 1429 100 %     Weight --      Height --      Head Circumference --      Peak Flow --      Pain Score 05/10/24 1431 0     Pain Loc --      Pain Education --      Exclude from Growth Chart --     Most recent vital signs: Vitals:    05/10/24 1429 05/10/24 1521  BP:  130/73  Pulse: (!) 101 99  Resp:  20  Temp:  98.3 F (36.8 C)  SpO2: 100% 100%    Physical Exam Constitutional:      Appearance: She is well-developed.  HENT:     Head: Atraumatic.   Eyes:     Conjunctiva/sclera: Conjunctivae normal.    Cardiovascular:     Rate and Rhythm: Regular rhythm.  Pulmonary:     Effort: Tachypnea, accessory muscle usage and respiratory distress present.     Breath sounds: Wheezing, rhonchi and rales present.  Chest:     Chest wall: No tenderness.  Abdominal:     General: There is no distension.     Palpations: Abdomen is soft.     Tenderness: There is no abdominal tenderness.   Musculoskeletal:        General: Normal range of motion.     Cervical back: Normal range of motion.     Right lower leg: Edema present.     Left lower leg: Edema present.   Skin:  General: Skin is warm.     Capillary Refill: Capillary refill takes less than 2 seconds.   Neurological:     General: No focal deficit present.     Mental Status: She is alert. Mental status is at baseline.     IMPRESSION / MDM / ASSESSMENT AND PLAN / ED COURSE  I reviewed the triage vital signs and the nursing notes.  Differential diagnosis including COPD exacerbation, pneumonia, new onset heart failure, progression of aortic stenosis, anemia  EKG  I, Viviano Ground, the attending physician, personally viewed and interpreted this ECG.  EKG showed underlying incomplete right bundle branch block.  Negative Sgarbossa's criteria.  No significant change when compared to prior EKG.  Heart rate of 107.  QRS 110.  QTc 459.  No tachycardic or bradycardic dysrhythmias while on cardiac telemetry.  RADIOLOGY I independently reviewed imaging, my interpretation of imaging: Chest x-ray appears to have a right lower lobe pneumonia however when compared to prior it appears to be chronically scarred.  Read as questionable lingular pneumonia underlying scarring.   Pleural effusion that is small.  LABS (all labs ordered are listed, but only abnormal results are displayed) Labs interpreted as -    Labs Reviewed  CBC - Abnormal; Notable for the following components:      Result Value   Hemoglobin 10.6 (*)    HCT 34.3 (*)    All other components within normal limits  COMPREHENSIVE METABOLIC PANEL WITH GFR - Abnormal; Notable for the following components:   Chloride 90 (*)    CO2 37 (*)    Creatinine, Ser 0.30 (*)    Calcium 8.5 (*)    Total Protein 6.2 (*)    Albumin 3.3 (*)    All other components within normal limits  BRAIN NATRIURETIC PEPTIDE - Abnormal; Notable for the following components:   B Natriuretic Peptide 347.4 (*)    All other components within normal limits  TROPONIN I (HIGH SENSITIVITY) - Abnormal; Notable for the following components:   Troponin I (High Sensitivity) 19 (*)    All other components within normal limits  TROPONIN I (HIGH SENSITIVITY)     MDM  Patient with no significant leukocytosis.  Anemia with a hemoglobin of 10.6, most recent lab work to compare was in 2023, no signs or symptoms of a GI bleed at this time.  Troponin mildly elevated at 19.  Creatinine appears to be at her baseline.  BNP mildly elevated in the 300s.  No prior to compare.  Patient was given DuoNeb treatment.  EKG with no findings of ischemia and is currently chest pain-free.  After DuoNeb treatment attempted to ambulate but continued to have significant exertional dyspnea.  Concern for COPD exacerbation and concern for new onset heart failure.  Consulted hospitalist for acute on chronic respiratory distress.  Clinical Course as of 05/10/24 1742  Fri May 10, 2024  1703 Attempted to ambulate the patient on her home oxygen , became significantly short of breath with only a couple of steps and had to sit down and rest.  Maintaining oxygenation in the low 90s. [SM]    Clinical Course User Index [SM] Viviano Ground, MD   Chest x-ray read as  questionable pneumonia.  Given that she is Pseudomonas risk factors we will start the patient on cefepime and give azithromycin  to cover for atypical pneumonia.  Given low-dose Lasix.  Consulted hospitalist for acute on chronic respiratory failure in the setting of COPD exacerbation and questionable pneumonia.  PROCEDURES:  Critical Care performed: no  Procedures  Patient's presentation is most consistent with acute presentation with potential threat to life or bodily function.   MEDICATIONS ORDERED IN ED: Medications  azithromycin  (ZITHROMAX ) 500 mg in sodium chloride  0.9 % 250 mL IVPB (has no administration in time range)  furosemide (LASIX) injection 20 mg (has no administration in time range)  ipratropium-albuterol  (DUONEB) 0.5-2.5 (3) MG/3ML nebulizer solution 9 mL (9 mLs Nebulization Given 05/10/24 1609)    FINAL CLINICAL IMPRESSION(S) / ED DIAGNOSES   Final diagnoses:  SOB (shortness of breath)  COPD exacerbation (HCC)  Edema, unspecified type     Rx / DC Orders   ED Discharge Orders     None        Note:  This document was prepared using Dragon voice recognition software and may include unintentional dictation errors.   Viviano Ground, MD 05/10/24 1742

## 2024-05-10 NOTE — Telephone Encounter (Signed)
 Received call from Angie with [facility]. Patient's edemma has gotten worse in legs making difficult to walk and O2 stats dropping. I lvm for Angie, that per provider, patient needs to go to ED. Also s/w patient's son. He stated he just landed and is unable to reach his sister. I told him, patient is in ED at [facility]

## 2024-05-10 NOTE — Assessment & Plan Note (Signed)
 Hgb stable, no clinical bleeding reported or observed

## 2024-05-10 NOTE — Hospital Course (Addendum)
 83 y.o. F with severe aortic stenosis, COPD, Sjogren's, bronchiectasis, on 2L home O2, hx NHL s/p chemoXRT and hx MAI who presented with increased leg swelling and SOB.  Increased swelling started few weeks ago but was okay until today, she was significantly more SOB than baseline.    In the ER, CXR showed chronic right base opacity.  Hgb 10 but at baseline.  Cr stable.  Given antibiotics for possible pneumonia and COPD exacerbation, and the hospitalist service was asked to evaluate.

## 2024-05-10 NOTE — Assessment & Plan Note (Signed)
-   On 2 L home O2

## 2024-05-10 NOTE — Assessment & Plan Note (Signed)
 On longterm treatment with Dr. Meredeth Stallion - Continue azithromycin , ethambutol , rifampin 

## 2024-05-10 NOTE — ED Triage Notes (Signed)
 Pt to ED via [facility] for c/o SOB, edema in bilateral lower legs. Pt given 125 solumedrol, a duo  neb and albuterol . Hx COPD, wears O2 at baseline.  Needs [place] interpreter

## 2024-05-11 DIAGNOSIS — R609 Edema, unspecified: Secondary | ICD-10-CM

## 2024-05-11 DIAGNOSIS — R0602 Shortness of breath: Principal | ICD-10-CM

## 2024-05-11 DIAGNOSIS — A310 Pulmonary mycobacterial infection: Secondary | ICD-10-CM | POA: Diagnosis not present

## 2024-05-11 DIAGNOSIS — Z515 Encounter for palliative care: Secondary | ICD-10-CM | POA: Diagnosis not present

## 2024-05-11 DIAGNOSIS — J441 Chronic obstructive pulmonary disease with (acute) exacerbation: Secondary | ICD-10-CM | POA: Diagnosis not present

## 2024-05-11 DIAGNOSIS — J9611 Chronic respiratory failure with hypoxia: Secondary | ICD-10-CM | POA: Diagnosis not present

## 2024-05-11 DIAGNOSIS — I350 Nonrheumatic aortic (valve) stenosis: Secondary | ICD-10-CM | POA: Diagnosis not present

## 2024-05-11 DIAGNOSIS — I5033 Acute on chronic diastolic (congestive) heart failure: Secondary | ICD-10-CM | POA: Diagnosis not present

## 2024-05-11 LAB — PROCALCITONIN: Procalcitonin: <0.1 ng/mL

## 2024-05-11 LAB — CBC
HCT: 32.4 % — ABNORMAL LOW (ref 36.0–46.0)
Hemoglobin: 9.8 g/dL — ABNORMAL LOW (ref 12.0–15.0)
MCH: 25.8 pg — ABNORMAL LOW (ref 26.0–34.0)
MCHC: 30.2 g/dL (ref 30.0–36.0)
MCV: 85.3 fL (ref 80.0–100.0)
Platelets: 184 10*3/uL (ref 150–400)
RBC: 3.8 MIL/uL — ABNORMAL LOW (ref 3.87–5.11)
RDW: 13.3 % (ref 11.5–15.5)
WBC: 4.2 10*3/uL (ref 4.0–10.5)
nRBC: 0 % (ref 0.0–0.2)

## 2024-05-11 MED ORDER — ENOXAPARIN SODIUM 40 MG/0.4ML IJ SOSY
40.0000 mg | PREFILLED_SYRINGE | INTRAMUSCULAR | Status: DC
  Administered 2024-05-11 – 2024-05-14 (×3): 40 mg via SUBCUTANEOUS
  Filled 2024-05-11 (×4): qty 0.4

## 2024-05-11 MED ORDER — FUROSEMIDE 10 MG/ML IJ SOLN
20.0000 mg | Freq: Three times a day (TID) | INTRAMUSCULAR | Status: DC
  Administered 2024-05-11 – 2024-05-13 (×7): 20 mg via INTRAVENOUS
  Filled 2024-05-11 (×7): qty 2

## 2024-05-11 MED ORDER — IPRATROPIUM-ALBUTEROL 0.5-2.5 (3) MG/3ML IN SOLN: 3.0000 mL | RESPIRATORY_TRACT | Status: DC | PRN

## 2024-05-11 MED ORDER — MORPHINE SULFATE 10 MG/5ML PO SOLN: 5.0000 mg | ORAL | Status: DC | PRN

## 2024-05-11 MED ORDER — ETHAMBUTOL HCL 400 MG PO TABS
400.0000 mg | ORAL_TABLET | Freq: Every day | ORAL | Status: DC
  Administered 2024-05-11 – 2024-05-14 (×4): 400 mg via ORAL
  Filled 2024-05-11 (×4): qty 1

## 2024-05-11 MED ORDER — MIRTAZAPINE 15 MG PO TABS
7.5000 mg | ORAL_TABLET | Freq: Every day | ORAL | Status: DC
  Administered 2024-05-11 – 2024-05-13 (×3): 7.5 mg via ORAL
  Filled 2024-05-11 (×4): qty 1

## 2024-05-11 MED ORDER — IPRATROPIUM-ALBUTEROL 0.5-2.5 (3) MG/3ML IN SOLN
3.0000 mL | Freq: Four times a day (QID) | RESPIRATORY_TRACT | Status: DC
  Administered 2024-05-11 (×2): 3 mL via RESPIRATORY_TRACT
  Filled 2024-05-11 (×2): qty 3

## 2024-05-11 MED ORDER — POTASSIUM CHLORIDE CRYS ER 20 MEQ PO TBCR
20.0000 meq | EXTENDED_RELEASE_TABLET | Freq: Every day | ORAL | Status: DC
Start: 2024-05-11 — End: 2024-05-14
  Administered 2024-05-11 – 2024-05-14 (×4): 20 meq via ORAL
  Filled 2024-05-11 (×4): qty 1

## 2024-05-11 MED ORDER — FUROSEMIDE 10 MG/ML IJ SOLN
20.0000 mg | Freq: Two times a day (BID) | INTRAMUSCULAR | Status: DC
  Administered 2024-05-11: 20 mg via INTRAVENOUS
  Filled 2024-05-11: qty 4

## 2024-05-11 MED ORDER — RIFAMPIN 300 MG PO CAPS
300.0000 mg | ORAL_CAPSULE | Freq: Every day | ORAL | Status: DC
  Administered 2024-05-11 – 2024-05-14 (×4): 300 mg via ORAL
  Filled 2024-05-11 (×4): qty 1

## 2024-05-11 MED ORDER — ALPRAZOLAM 0.25 MG PO TABS: 0.2500 mg | ORAL_TABLET | Freq: Three times a day (TID) | ORAL | Status: DC | PRN

## 2024-05-11 MED ORDER — SODIUM CHLORIDE 3 % IN NEBU: 4.0000 mL | INHALATION_SOLUTION | RESPIRATORY_TRACT | Status: AC | PRN

## 2024-05-11 MED ORDER — AZITHROMYCIN 250 MG PO TABS
250.0000 mg | ORAL_TABLET | Freq: Every day | ORAL | Status: DC
  Administered 2024-05-11 – 2024-05-14 (×4): 250 mg via ORAL
  Filled 2024-05-11 (×4): qty 1

## 2024-05-11 MED ORDER — RIFAMPIN 150 MG PO CAPS: 150.0000 mg | ORAL_CAPSULE | Freq: Every day | ORAL | Status: DC

## 2024-05-11 MED ORDER — ACETAMINOPHEN 650 MG RE SUPP: 650.0000 mg | Freq: Four times a day (QID) | RECTAL | Status: DC | PRN

## 2024-05-11 MED ORDER — ONDANSETRON HCL 4 MG/2ML IJ SOLN: 4.0000 mg | Freq: Four times a day (QID) | INTRAMUSCULAR | Status: DC | PRN

## 2024-05-11 MED ORDER — ONDANSETRON HCL 4 MG PO TABS
4.0000 mg | ORAL_TABLET | Freq: Four times a day (QID) | ORAL | Status: DC | PRN
Start: 2024-05-11 — End: 2024-05-14

## 2024-05-11 MED ORDER — ALPRAZOLAM 0.5 MG PO TABS
0.5000 mg | ORAL_TABLET | Freq: Every day | ORAL | Status: DC
  Administered 2024-05-11 – 2024-05-13 (×3): 0.5 mg via ORAL
  Filled 2024-05-11 (×4): qty 1

## 2024-05-11 MED ORDER — BUDESON-GLYCOPYRROL-FORMOTEROL 160-9-4.8 MCG/ACT IN AERO
2.0000 | INHALATION_SPRAY | Freq: Two times a day (BID) | RESPIRATORY_TRACT | Status: DC
  Administered 2024-05-11 – 2024-05-14 (×7): 2 via RESPIRATORY_TRACT
  Filled 2024-05-11 (×2): qty 5.9

## 2024-05-11 MED ORDER — ACETAMINOPHEN 325 MG PO TABS
650.0000 mg | ORAL_TABLET | Freq: Four times a day (QID) | ORAL | Status: DC | PRN
Start: 2024-05-11 — End: 2024-05-14

## 2024-05-11 NOTE — Consult Note (Signed)
 Consultation Note Date: 05/11/2024   Patient Name: Breanna Christian  DOB: [birth year]  MRN: [id number]  Age / Sex: 83 y.o., female  PCP: Fernand Sigrid HERO, MD Referring Physician: Awanda City, MD  Reason for Consultation: Establishing goals of care   HPI/Brief Hospital Course: 83 y.o. female  with past medical history of severe aortic stenosis (not a candidate for TAVR), CHF, COPD, Sjogren;s, bronchiectasis, chronic respiratory failure with hypoxia on 2L [place] at baseline, MAI currently on triple antibiotic therapy, history of non hodgkin lymphoma s/p chemo and radiation therapy admitted from home on 05/10/2024 with increased bilateral lower extremity edema and increased SHOB.   Admitted and being treated for CHF exacerbation, COPD exacerbation and possible pneumonia-requiring IV lasix  and antibiotic therapy  Palliative medicine was consulted for assisting with goals of care conversations.  Subjective:  Extensive chart review has been completed prior to meeting patient including labs, vital signs, imaging, progress notes, orders, and available advanced directive documents from current and previous encounters.  Visited with Breanna Christian at her bedside. She is awake, alert and able to engage in conversation. She is able to understand and speak English, severely HOH and does not have her hearing aids with her. Daughter present at bedside, son present via FaceTime. They decline [place] interpreter.  Introduced myself as a Publishing rights manager as a member of the palliative care team. Explained palliative medicine is specialized medical care for people living with serious illness. It focuses on providing relief from the symptoms and stress of a serious illness. The goal is to improve quality of life for both the patient and the family.   Breanna Christian shares she is feeling well today, eating fruit without difficulty, reports improvement in her breathing. Moderate bilateral lower  extremity edema remains compared to her baseline according to family. She denies acute pain or discomfort.  Son-George serves as Product manager. He shares his understanding of current medical situation and shares he recognizes his mother's declining health.  Breanna Christian is widowed. She has three children in total, all live local to the area and are involved in caring for her. At baseline prior to admission she remained fairly independent. Zachary shares they have worked with PCP in establishing medication regimen to manage anxiety/SHOB/air hunger with exertion that has been working well that includes Mirtazapine -also being utilized for appetite stimulant as well as lorazepam.  We discussed patient's current illness and what it means in the larger context of patient's on-going co-morbidities. Natural disease trajectory and expectations at EOL were discussed.   Zachary shares they are interested in considering hospice services at home at discharge. He and Breanna Christian would like to continue current acute treatment to optimize her prior to discharge.  We discussed the difference between palliative care and hospice services. We discussed the overall philosophy of hospice and services provided in the home. We also discussed the use of medications for pain, discomfort, shortness of breath and air hunger as needed once transitioned to comfort care/hospice care.  Code status confirmed as DNR/DNI.  Family expresses interest in wanting to discuss hospice services in more detail with hospice liaison. Family utilized [facility] hospice for their father and would like to utilize the same agency.  I discussed importance of continued conversations with family/support persons and all members of their medical team regarding overall plan of care and treatment options ensuring decisions are in alignment with patients goals of care.  All questions/concerns addressed. Emotional support provided to patient/family/support persons.  PMT will continue to follow and  support patient as needed.  Objective: Primary Diagnoses: Present on Admission:  Acute on chronic diastolic CHF (congestive heart failure) (HCC)  MAI (mycobacterium avium-intracellulare) infection (HCC)  Essential hypertension  Chronic respiratory failure with hypoxia (HCC)   Physical Exam Constitutional:      General: She is not in acute distress.    Appearance: She is ill-appearing.  HENT:     Right Ear: Decreased hearing noted.     Left Ear: Decreased hearing noted.  Pulmonary:     Effort: Tachypnea present.   Musculoskeletal:     Right lower leg: 1+ Pitting Edema present.     Left lower leg: 1+ Pitting Edema present.   Skin:    General: Skin is warm and dry.   Neurological:     Mental Status: She is alert and oriented to person, place, and time.     Vital Signs: BP 114/65   Pulse (!) 102   Temp 98.4 F (36.9 C)   Resp 20   Ht 5' 1 (1.549 m)   Wt 48.5 kg   SpO2 95%   BMI 20.22 kg/m  Pain Scale: 0-10   Pain Score: 0-No pain  IO: Intake/output summary: No intake or output data in the 24 hours ending 05/11/24 1530  LBM:   Baseline Weight: Weight: 46.9 kg Most recent weight: Weight: 48.5 kg      Assessment and Plan  SUMMARY OF RECOMMENDATIONS   DNR/DNI Interested in possibly pursuing hospice services at discharge in the home Continue current plan of care-optimize prior to discharge  Palliative Prophylaxis:   Bowel Regimen, Delirium Protocol and Frequent Pain Assessment  Discussed With: Primary team, nursing staff, [facility] hospice liaison and [facility]   Thank you for this consult and allowing Palliative Medicine to participate in the care of Breanna Christian. Palliative medicine will continue to follow and assist as needed.   Time Total: 75 minutes  Time spent includes: Detailed review of medical records (labs, imaging, vital signs), medically appropriate exam (mental status, respiratory, cardiac, skin), discussed with  treatment team, counseling and educating patient, family and staff, documenting clinical information, medication management and coordination of care.   Signed by: Waddell Lesches, DNP, AGNP-C Palliative Medicine    Please contact Palliative Medicine Team phone at [phone] for questions and concerns.  For individual provider: See Tracey

## 2024-05-11 NOTE — Progress Notes (Signed)
  PROGRESS NOTE    Breanna Christian  FMW:[id number] DOB: Jun 22, 1941 DOA: 05/10/2024 PCP: Fernand Sigrid HERO, MD  [id number]  LOS: 1 day   Brief hospital course:   Assessment & Plan: 83 y.o. F with severe aortic stenosis, COPD, Sjogren's, bronchiectasis, on 2L home O2, hx NHL s/p chemoXRT and hx MAI who presented with increased leg swelling and SOB.   Acute on chronic diastolic CHF (congestive heart failure) (HCC) Severe aortic stenosis Echo last Mar with normal EF, severe AS.  Saw Cardiology last April, not a candidate for TAVR given advanced lung disease, high risk to low benefit given her lung disease.  No on diuretics at home. --started on IV lasix  with symptomatic improvement. --cardio consulted --cont IV lasix  20 q8h, per cardio   COPD, ruled out exacerbation Chronic hypoxemic respiratory failure on 2L O2 --no wheezing, increased cough, increased sputum to suggest COPD exacerbation.   --cont daily bronchodilators  PNA, ruled out --procal neg, WBC wnl.  No fever, or cough with sputum. --will not continue cefepime   MAI (mycobacterium avium-intracellulare) infection (HCC) On longterm treatment with Dr. Fernand - Continue home azithromycin , ethambutol , rifampin    Essential hypertension BP normal.  Not on medications any more at home --IV lasix    Normocytic anemia Hgb stable, no clinical bleeding reported or observed   Sjogren syndrome (HCC) Diuretics for CHF had previously been held because of her dry eyes and dry mouth.     DVT prophylaxis: Lovenox  SQ Code Status: DNR  Family Communication: son and daughter updated at bedside today Level of care: Med-Surg Dispo:   The patient is from: home Anticipated d/c is to: home Anticipated d/c date is: 1-2 days   Subjective and Interval History:  Pt reported dyspnea and swelling improved.  Good urine output.   Objective: Vitals:   05/11/24 0917 05/11/24 1315 05/11/24 1329 05/11/24 1527  BP: 105/76 116/89  114/65   Pulse: 99 73  (!) 102  Resp: (!) 24 20  20   Temp: 98.2 F (36.8 C)   98.4 F (36.9 C)  TempSrc: Oral     SpO2: 99% 100%  95%  Weight:   48.5 kg   Height:   5' 1 (1.549 m)    No intake or output data in the 24 hours ending 05/11/24 1655 Filed Weights   05/11/24 0436 05/11/24 1329  Weight: 46.9 kg 48.5 kg    Examination:   Constitutional: NAD, AAOx3 HEENT: conjunctivae and lids normal, EOMI CV: No cyanosis.   RESP: normal respiratory effort, on 2L Neuro: II - XII grossly intact.   Psych: Normal mood and affect.     Data Reviewed: I have personally reviewed labs and imaging studies  Time spent: 50 minutes  Ellouise Haber, MD [facility] If 7PM-7AM, please contact night-coverage 05/11/2024, 4:55 PM

## 2024-05-11 NOTE — Progress Notes (Addendum)
 [facility] Liaison Note  Received a request to speak with family about Hospice services from [facility], [place], [place].  Met with patient, her daughter and son at the bedside to answer questions that they had.  At this time they would like to see how patient does before making any decisions about services in the home.  Family given HL contact information so they can call if questions or concerns arise.    Please call with any hospice related questions or concerns  Thank you for the opportunity to participate in this patient's care  [facility] Liaison [phone]

## 2024-05-11 NOTE — Consult Note (Signed)
 Cardiology Consultation   Patient ID: CHERRIL HETT MRN: [id number]; DOB: July 12, 1941  Admit date: 05/10/2024 Date of Consult: 05/11/2024  PCP:  Breanna Sigrid HERO, MD   [facility] Providers Cardiologist:  Deatrice Cage, MD        Patient Profile: Breanna Christian is a 83 y.o. female with a hx of chronic respiratory failure, severe COPD, MAI infection, hypertension, aortic stenosis, mitral regurgitation, non-Hodgkin's lymphoma(2021) s/p chemotherapy and radiation, who is being seen 05/11/2024 for the evaluation of shortness of breath at the request of Dr Breanna Christian.  History of Present Illness: Ms. Breanna Christian underwent an echocardiogram in 04/2020 showing normal LVSF, mild aortic stenosis, mild to moderate MR. History of non-Hodgkin's lymphoma (2021) that was treated with chemotherapy and radiation. Since that time, she developed severe lung disease. She had extensive secondhand smoking and was exposed to several different chemicals. She underwent a bronchoscopy done (2017) and was diagnosed with Mycobacterium AVM complex colonization. She suffers from frequent PNA infections requiring antibiotics. She remains on chronic O2 at 2L but continues to have decline in her respiratory status.  She was previously provide 2025 reporting severe exertional dyspnea.  Echocardiogram showed 60 to 65%, normal RV SF, moderate to severe MR, mild to moderate MS, severe mitral annular calcification, mild to moderate TR, severe calcification of the aortic valve with mild AI, severe aortic stenosis, VTI 0.30 mm, aortic valve mean gradient 42 mmHg.  She was last seen in clinic 03/10/2024 by Dr. Wonda from the structural team for evaluation of severe aortic stenosis.  It was determined at that time with her chronic lung disease being so advanced she would not be able to safely proceed with TAVR even under deep conscious sedation.  In addition the benefit of treating her aortic stenosis would be greatly reduced  in the setting of her advanced lung disease.  It would be appropriate to treat her in a palliative fashion and that was clearly discussed with her and her son during their appointment.  She presented to the [facility] emergency department 05/10/2024 via EMS with complaints of shortness of breath and bilateral lower extremity edema.  Patient states that her baseline had difficulties with breathing secondary to her shortness of breath from COPD.  States that over the past day and specifically worsened this morning with worsening shortness of breath.  Normally able to ambulate 5-6 laps around her house wearing only 2 L of O2 via nasal cannula today she was unable to complete 1 lap secondary to the shortness of breath.  She received DuoNeb treatment prior to arrival and does had some improvement in symptoms.  She denies any history of heart failure.  States that she started having edema to her bilateral lower extremities approximately 3 weeks ago.  She has been followed by cardiology and pulmonary in the outpatient setting.  Initial vital signs revealed a blood pressure 130/73, pulse of 101, respirations of 20 and a temperature of 98.3.  Pertinent labs revealed hemoglobin of 10.6, chloride of 90, CO2 37, serum creatinine 0.30, calcium 8.5, BNP 347.4, high-sensitivity troponin of 19.  She continues to remain chest pain-free and EKG was nonacute with no findings of ischemia.  Chest x-ray was questionable for pneumonia and she is high risk for Pseudomonas so she was started on cefepime  and azithromycin  to cover for atypical pneumonia and given low-dose furosemide .  Cardiology was consulted to assist in the management of acute on chronic diastolic congestive heart failure and severe aortic stenosis.  Past Medical History:  Diagnosis Date   Bronchiectasis (HCC) 2011   COPD (chronic obstructive pulmonary disease) (HCC)    HOH (hard of hearing)    Hypertension    Lung nodules    Mycobacterium avium complex colonization     Non-Hodgkin lymphoma (HCC) 2003   Personal history of chemotherapy    Pneumonia    FREQUENT IN PAST    H/O MYCOBACTERIAL   Rheumatoid arthritis (HCC)    Sjogren's syndrome (HCC)    Xerostomia     Past Surgical History:  Procedure Laterality Date   APPENDECTOMY     BRONCHOSCOPY  02/2016   CATARACT EXTRACTION W/PHACO Right 10/26/2016   Procedure: CATARACT EXTRACTION PHACO AND INTRAOCULAR LENS PLACEMENT (IOC);  Surgeon: Steven Dingeldein, MD;  Location: [facility] ORS;  Service: Ophthalmology;  Laterality: Right;  US  2.17 AP% 26.5 CDE 56.86 Fluid pack lot # [id number]   FLEXIBLE BRONCHOSCOPY Bilateral 03/16/2016   Procedure: FLEXIBLE BRONCHOSCOPY;  Surgeon: Elfreda DELENA Bathe, MD;  Location: [facility] ORS;  Service: Pulmonary;  Laterality: Bilateral;   FLEXIBLE BRONCHOSCOPY N/A 03/03/2017   Procedure: FLEXIBLE BRONCHOSCOPY;  Surgeon: Elfreda DELENA Bathe, MD;  Location: [facility] ORS;  Service: Pulmonary;  Laterality: N/A;   TUBAL LIGATION       Home Medications:  Prior to Admission medications   Medication Sig Start Date End Date Taking? Authorizing Provider  albuterol  (VENTOLIN  HFA) 108 (90 Base) MCG/ACT inhaler Inhale 2 puffs into the lungs every 6 (six) hours as needed for wheezing or shortness of breath. Patient taking differently: Inhale 2 puffs into the lungs 3 (three) times daily. 11/27/23  Yes Abernathy, Mardy, NP  ALPRAZolam  (XANAX ) 0.25 MG tablet Take 0.25 mg by mouth 3 (three) times daily as needed. 01/11/24  Yes [provider]  ALPRAZolam  (XANAX ) 0.5 MG tablet Take half tab in am and one at night 01/02/24  Yes Khan, Fozia M, MD  ARTIFICIAL TEAR OP Place 1 drop into both eyes every 6 (six) hours as needed (dry eyes). A product from [place].   Yes [provider]  azithromycin  (ZITHROMAX ) 250 MG tablet Take 1 tablet (250 mg total) by mouth daily. 03/05/24  Yes Khan, Fozia M, MD  budeson-glycopyrrolate -formoterol  (BREZTRI  AEROSPHERE) 160-9-4.8 MCG/ACT AERO inhaler Inhale 2 puffs into the  lungs 2 (two) times daily. 03/05/24  Yes Khan, Fozia M, MD  ethambutol  (MYAMBUTOL ) 400 MG tablet Take 1 tablet (400 mg total) by mouth daily. 03/05/24  Yes Khan, Fozia M, MD  ibandronate  (BONIVA ) 150 MG tablet TAKE 1 TABLET (150 MG TOTAL) BY MOUTH EVERY 30 (THIRTY) DAYS. TAKE IN THE MORNING WITH A FULL GLASS OF WATER, ON AN EMPTY STOMACH, AND DO NOT TAKE ANYTHING ELSE BY MOUTH OR LIE DOWN FOR THE NEXT 30 MIN. 02/12/24  Yes Khan, Fozia M, MD  ibuprofen (ADVIL,MOTRIN) 200 MG tablet Take 200 mg by mouth as needed.   Yes [provider]  ipratropium-albuterol  (DUONEB) 0.5-2.5 (3) MG/3ML SOLN Take 3 mLs by nebulization every 2 (two) hours as needed. DX: J44.9 12/12/23  Yes Khan, Fozia M, MD  Lifitegrast 5 % SOLN Apply 1 drop to eye in the morning and at bedtime. 1 Gtt both eyes twice a day   Yes [provider]  mirtazapine  (REMERON ) 7.5 MG tablet Take 7.5 mg by mouth at bedtime. 05/03/24  Yes [provider]  Multiple Vitamin (MULTI-VITAMINS) TABS Take 1 tablet by mouth daily. Reported on 04/25/2016   Yes [provider]  Omega-3 Fatty Acids (OMEGA-3 FISH OIL) 300 MG CAPS Take by  mouth daily.   Yes [provider]  rifampin  (RIFADIN ) 150 MG capsule TAKE 1 CAPSULE DAILY 02/20/24  Yes Khan, Fozia M, MD  sodium chloride  HYPERTONIC 3 % nebulizer solution Take by nebulization as needed for other. 11/30/20  Yes Breanna Elfreda LABOR, MD    Scheduled Meds:  ALPRAZolam   0.5 mg Oral QHS   azithromycin   250 mg Oral Daily   budesonide -glycopyrrolate -formoterol   2 puff Inhalation BID   enoxaparin  (LOVENOX ) injection  40 mg Subcutaneous Q24H   ethambutol   400 mg Oral Daily   furosemide   20 mg Intravenous BID   ipratropium-albuterol   3 mL Nebulization QID   mirtazapine   7.5 mg Oral QHS   potassium chloride   20 mEq Oral Daily   rifampin   300 mg Oral Daily   Continuous Infusions:  PRN Meds: acetaminophen **OR** acetaminophen, ALPRAZolam , morphine, ondansetron  **OR** ondansetron   (ZOFRAN ) IV, sodium chloride  HYPERTONIC  Allergies:    Allergies  Allergen Reactions   Prednisone  Other (See Comments)    Swells my gums but pt still takes it if needed    Social History:   Social History   Socioeconomic History   Marital status: Married    Spouse name: Not on file   Number of children: Not on file   Years of education: Not on file   Highest education level: Not on file  Occupational History   Not on file  Tobacco Use   Smoking status: Never   Smokeless tobacco: Never  Vaping Use   Vaping status: Never Used  Substance and Sexual Activity   Alcohol use: No   Drug use: No   Sexual activity: Not on file  Other Topics Concern   Not on file  Social History Narrative   Not on file   Social Drivers of Health   Financial Resource Strain: Not on file  Food Insecurity: Not on file  Transportation Needs: Not on file  Physical Activity: Not on file  Stress: Not on file  Social Connections: Not on file  Intimate Partner Violence: Not on file    Family History:    Family History  Problem Relation Age of Onset   Cancer Sister    Breast cancer Neg Hx      ROS:  Please see the history of present illness.  Review of Systems  Constitutional:  Positive for malaise/fatigue.  Respiratory:  Positive for shortness of breath.   Cardiovascular:  Positive for leg swelling.  Neurological:  Positive for weakness.    All other ROS reviewed and negative.     Physical Exam/Data: Vitals:   05/11/24 0436 05/11/24 0610 05/11/24 0620 05/11/24 0657  BP:   119/73   Pulse:  100 (!) 103   Resp:   16   Temp:    98 F (36.7 C)  TempSrc:    Oral  SpO2:  100% 100%   Weight: 46.9 kg     Height: 5' 1 (1.549 m)      No intake or output data in the 24 hours ending 05/11/24 0912    05/11/2024    4:36 AM 03/18/2024   11:21 AM 03/14/2024    3:57 PM  Last 3 Weights  Weight (lbs) 103 lb 6.3 oz 104 lb 6.4 oz 104 lb 3.2 oz  Weight (kg) 46.9 kg 47.356 kg 47.265 kg      Body mass index is 19.54 kg/m.  General:  Frail, chronically ill-appearing, in no acute distress HEENT: normal Neck: no JVD, heart murmur radiates into  the carotids Vascular: No carotid bruits; Distal pulses 2+ bilaterally Cardiac:  normal S1, S2; RRR; III/VI harsh systolic murmur  Lungs: Diminished with expiratory wheezing to auscultation bilaterally, respirations are unlabored at rest on 2L O2 via [place] Abd: soft, nontender, no hepatomegaly  Ext: 1+ edema BLE Musculoskeletal:  No deformities, BUE and BLE strength normal and equal Skin: warm and dry  Neuro:  CNs 2-12 intact, no focal abnormalities noted Psych:  Normal affect   EKG:  The EKG was personally reviewed and demonstrates: Sinus tachycardia with rate of 107, left atrial enlargement, incomplete right bundle branch block, nonspecific ST changes Telemetry:  Telemetry was personally reviewed and demonstrates: Not currently on telemetry  Relevant CV Studies: Echo 01/2024  1. Left ventricular ejection fraction, by estimation, is 60 to 65%. The  left ventricle has normal function. The left ventricle has no regional  wall motion abnormalities. Left ventricular diastolic parameters are  indeterminate.   2. Right ventricular systolic function is normal. The right ventricular  size is normal. There is mildly elevated pulmonary artery systolic  pressure. The estimated right ventricular systolic pressure is 38.2 mmHg.   3. The mitral valve is normal in structure. Moderate to severe mitral  valve regurgitation. Mild to moderate mitral stenosis. The mean mitral  valve gradient is 8.0 mmHg. Severe mitral annular calcification.   4. Tricuspid valve regurgitation is mild to moderate.   5. The aortic valve is normal in structure. There is severe calcifcation  of the aortic valve. Aortic valve regurgitation is mild. Severe aortic  valve stenosis. Aortic valve area, by VTI measures 0.30 cm. Aortic valve  mean gradient measures 42.0 mmHg.   Aortic valve Vmax measures 4.64 m/s.   6. The inferior vena cava is normal in size with greater than 50%  respiratory variability, suggesting right atrial pressure of 3 mmHg.   Laboratory Data: High Sensitivity Troponin:   Recent Labs  Lab 05/10/24 1433 05/10/24 1934  TROPONINIHS 19* 20*     Chemistry Recent Labs  Lab 05/10/24 1433  NA 136  K 4.5  CL 90*  CO2 37*  GLUCOSE 87  BUN 15  CREATININE 0.30*  CALCIUM 8.5*  GFRNONAA >60  ANIONGAP 9    Recent Labs  Lab 05/10/24 1433  PROT 6.2*  ALBUMIN 3.3*  AST 30  ALT 19  ALKPHOS 53  BILITOT 0.8   Lipids No results for input(s): CHOL, TRIG, HDL, LABVLDL, LDLCALC, CHOLHDL in the last 168 hours.  Hematology Recent Labs  Lab 05/10/24 1433 05/11/24 0427  WBC 4.8 4.2  RBC 4.08 3.80*  HGB 10.6* 9.8*  HCT 34.3* 32.4*  MCV 84.1 85.3  MCH 26.0 25.8*  MCHC 30.9 30.2  RDW 13.3 13.3  PLT 184 184   Thyroid  No results for input(s): TSH, FREET4 in the last 168 hours.  BNP Recent Labs  Lab 05/10/24 1433  BNP 347.4*    DDimer No results for input(s): DDIMER in the last 168 hours.  Radiology/Studies:  DG Chest 2 View Result Date: 05/10/2024 CLINICAL DATA:  Shortness of breath. Bilateral lower leg swelling. History of bronchiectasis, COPD, hypertension and lung nodules. EXAM: CHEST - 2 VIEW COMPARISON:  08/30/2021 and chest CT dated 11/28/2023. FINDINGS: Stable normal sized heart. Stable aortic arch calcifications. No significant change in chronic complete consolidation of the right middle lobe with bronchiectasis and right pleural fluid. No significant change in lingular scarring. Possible mild superimposed airspace opacity in the lingula. Previously demonstrated small probable infectious nodular areas in the  left lung are faintly visible. Minimal left pleural effusion. Diffuse osteopenia. IMPRESSION: 1. Possible mild lingular pneumonia superimposed on chronic scarring. 2. No significant change in chronic  complete consolidation of the right middle lobe with bronchiectasis and right pleural fluid. 3. Minimal left pleural effusion. 4. Previously demonstrated small probable infectious nodular areas in the left lung are faintly visible. Electronically Signed   By: Elspeth Bathe M.D.   On: 05/10/2024 17:29     Assessment and Plan: COPD with acute exacerbation/possible pneumonia/chronic respiratory failure with hypoxia -Patient with chronic shortness of breath on 2 L of O2 via nasal cannula at home -Chest x-ray shows possible acute new infiltrates on chronic scarring consolidation -Continued on antibiotics and steroids -Continued on scheduled bronchodilators -Supportive care -Ongoing management per IM  Acute on chronic HFpEF/severe aortic stenosis -Presented with shortness of breath and peripheral edema -BNP 347.4 -Continued on furosemide  20 mg IV twice daily -Daily BMP while on diuretic therapy -Echocardiogram ordered and pending with further recommendations to follow -Echocardiogram in March with normal EF and severe AS -Evaluated by structural heart team and deemed not a candidate for TAVR given advanced lung disease, high risk to low benefit -Recommend palliative care consultation -Daily weights and I's and O's  Primary hypertension -Blood pressure 105/76 - Not currently on antihypertensive medications continued on furosemide  20 mg IV twice daily -Vital signs per unit protocol  Normocytic anemia -Stable hemoglobin 9.8 - No active signs of bleeding - Trend CBC   Risk Assessment/Risk Scores:       [place]  Heart Association (NYHA) Functional Class NYHA Class III       For questions or updates, please contact [facility] Please consult [email or web address] for contact info under    Signed, Marshawn Ninneman, NP  05/11/2024 9:12 AM

## 2024-05-11 NOTE — TOC Initial Note (Signed)
 Transition of Care [facility]) - Initial/Assessment Note    Patient Details  Name: Breanna Christian MRN: [id number] Date of Birth: [birth year]  [facility]) CM/SW Contact:    [place] L Khori Rosevear, LCSW Phone Number: 05/11/2024, 12:30 PM  Clinical Narrative:                  CSW was advised that family would like to explore Hospice at home with [facility]. Message was sent to [facility] to schedule a meeting with the family.        Patient Goals and CMS Choice            Expected Discharge Plan and Services                                              Prior Living Arrangements/Services                       Activities of Daily Living      Permission Sought/Granted                  Emotional Assessment              Admission diagnosis:  Acute on chronic diastolic CHF (congestive heart failure) (HCC) [I50.33] Patient Active Problem List   Diagnosis Date Noted   Acute on chronic diastolic CHF (congestive heart failure) (HCC) 05/10/2024   COPD with acute exacerbation (HCC) 05/10/2024   Severe aortic stenosis 05/10/2024   Sjogren syndrome (HCC) 05/10/2024   Normocytic anemia 05/10/2024   Chronic respiratory failure with hypoxia (HCC) 11/28/2023   Encounter for general adult medical examination with abnormal findings 08/08/2020   Other fatigue 08/08/2020   Need for vaccination against Streptococcus pneumoniae using pneumococcal conjugate vaccine 7 08/08/2020   Age related osteoporosis 10/15/2018   Ovarian failure 10/15/2018   Screening for breast cancer 04/29/2018   Essential hypertension 04/29/2018   Obstructive chronic bronchitis without exacerbation (HCC) 04/29/2018   MAI (mycobacterium avium-intracellulare) infection (HCC) 02/08/2018   Numbness and tingling 03/25/2015   Myopathy 02/19/2015   Polyneuropathy 02/18/2015   Weakness of both lower extremities 02/18/2015   PCP:  Fernand Sigrid HERO, MD Pharmacy:    [facility] [phone] [address], [place] - [address] [place] [address] Phone: [phone] Fax: [phone]  [facility] - [place], [place] - [address] [place] [address] Phone: [phone] Fax: [phone]     Social Drivers of Health (SDOH) Social History: SDOH Screenings   Alcohol Screen: Low Risk  (04/08/2021)  Depression (PHQ2-9): Low Risk  (03/05/2024)  Tobacco Use: Low Risk  (05/10/2024)   SDOH Interventions:     Readmission Risk Interventions     No data to display

## 2024-05-12 DIAGNOSIS — J441 Chronic obstructive pulmonary disease with (acute) exacerbation: Secondary | ICD-10-CM | POA: Diagnosis not present

## 2024-05-12 DIAGNOSIS — I350 Nonrheumatic aortic (valve) stenosis: Secondary | ICD-10-CM | POA: Diagnosis not present

## 2024-05-12 DIAGNOSIS — A310 Pulmonary mycobacterial infection: Secondary | ICD-10-CM | POA: Diagnosis not present

## 2024-05-12 DIAGNOSIS — I5033 Acute on chronic diastolic (congestive) heart failure: Secondary | ICD-10-CM | POA: Diagnosis not present

## 2024-05-12 DIAGNOSIS — Z515 Encounter for palliative care: Secondary | ICD-10-CM | POA: Diagnosis not present

## 2024-05-12 DIAGNOSIS — J9611 Chronic respiratory failure with hypoxia: Secondary | ICD-10-CM | POA: Diagnosis not present

## 2024-05-12 LAB — BASIC METABOLIC PANEL WITH GFR
Anion gap: 13 (ref 5–15)
BUN: 15 mg/dL (ref 8–23)
CO2: 43 mmol/L — ABNORMAL HIGH (ref 22–32)
Calcium: 8.4 mg/dL — ABNORMAL LOW (ref 8.9–10.3)
Chloride: 84 mmol/L — ABNORMAL LOW (ref 98–111)
Creatinine, Ser: 0.45 mg/dL (ref 0.44–1.00)
GFR, Estimated: >60 mL/min (ref >60)
Glucose, Bld: 129 mg/dL — ABNORMAL HIGH (ref 70–99)
Potassium: 3.4 mmol/L — ABNORMAL LOW (ref 3.5–5.1)
Sodium: 140 mmol/L (ref 135–145)

## 2024-05-12 LAB — MAGNESIUM: Magnesium: 1.8 mg/dL (ref 1.7–2.4)

## 2024-05-12 MED ORDER — BISOPROLOL FUMARATE 5 MG PO TABS
2.5000 mg | ORAL_TABLET | Freq: Two times a day (BID) | ORAL | Status: DC
  Administered 2024-05-12 – 2024-05-14 (×5): 2.5 mg via ORAL
  Filled 2024-05-12 (×5): qty 0.5

## 2024-05-12 MED ORDER — POTASSIUM CHLORIDE CRYS ER 20 MEQ PO TBCR
20.0000 meq | EXTENDED_RELEASE_TABLET | Freq: Once | ORAL | Status: AC
  Administered 2024-05-12: 20 meq via ORAL
  Filled 2024-05-12: qty 1

## 2024-05-12 NOTE — Care Management Important Message (Signed)
 Important Message  Patient Details  Name: Breanna Christian MRN: [id number] Date of Birth: 09/04/1941   Important Message Given:  Yes - Medicare IM     [name] 05/12/2024, 2:30 PM

## 2024-05-12 NOTE — Plan of Care (Signed)

## 2024-05-12 NOTE — Plan of Care (Signed)
   Problem: Education: Goal: Knowledge of General Education information will improve Description: Including pain rating scale, medication(s)/side effects and non-pharmacologic comfort measures Outcome: Adequate for Discharge   Problem: Health Behavior/Discharge Planning: Goal: Ability to manage health-related needs will improve Outcome: Adequate for Discharge   Problem: Clinical Measurements: Goal: Ability to maintain clinical measurements within normal limits will improve Outcome: Adequate for Discharge Goal: Will remain free from infection Outcome: Adequate for Discharge

## 2024-05-12 NOTE — Progress Notes (Addendum)
 [facility] Liaison Note:   (new referral for outpatient palliative services)  Notified by [facility], Marinda Cooks, RN, of patient/family request for [facility] services at home after discharge.  Referral submitted today.    Please call with any hospice or outpatient palliative care related questions.  Thank you for the opportunity to participate in this patient's care.  Marinell Nova, [facility] Liaison [phone]

## 2024-05-12 NOTE — Progress Notes (Signed)
 Rounding Note   Patient Name: Breanna Christian Date of Encounter: 05/12/2024  [facility] Cardiologist: Deatrice Cage, MD   Subjective Seen on AM rounds. Denies chest pain. Breathing has improved since yesterday.   -1.5 L output in the last 24 hours.  Been up out of bed to and from the bathroom.  More shortness of breath on movement Pain Scheduled Meds:  ALPRAZolam   0.5 mg Oral QHS   azithromycin   250 mg Oral Daily   bisoprolol  2.5 mg Oral BID   budesonide -glycopyrrolate -formoterol   2 puff Inhalation BID   enoxaparin  (LOVENOX ) injection  40 mg Subcutaneous Q24H   ethambutol   400 mg Oral Daily   furosemide   20 mg Intravenous Q8H   mirtazapine   7.5 mg Oral QHS   potassium chloride   20 mEq Oral Daily   rifampin   300 mg Oral Daily   Continuous Infusions:  PRN Meds: acetaminophen **OR** acetaminophen, ALPRAZolam , ipratropium-albuterol , morphine, ondansetron  **OR** ondansetron  (ZOFRAN ) IV, sodium chloride  HYPERTONIC   Vital Signs  Vitals:   05/11/24 2113 05/12/24 0330 05/12/24 0500 05/12/24 0906  BP: 127/60 110/60  (!) 105/59  Pulse: 96 (!) 108  100  Resp: 18 20  19   Temp: 97.9 F (36.6 C) 98.1 F (36.7 C)  98.3 F (36.8 C)  TempSrc:    Oral  SpO2: 98% 100%  98%  Weight:   45.4 kg   Height:        Intake/Output Summary (Last 24 hours) at 05/12/2024 0949 Last data filed at 05/12/2024 0700 Gross per 24 hour  Intake 240 ml  Output 1800 ml  Net -1560 ml      05/12/2024    5:00 AM 05/11/2024    1:29 PM 05/11/2024    4:36 AM  Last 3 Weights  Weight (lbs) 100 lb 1.4 oz 107 lb 103 lb 6.3 oz  Weight (kg) 45.4 kg 48.535 kg 46.9 kg      Telemetry Normal sinus rhythm- Personally Reviewed  ECG   - Personally Reviewed  Physical Exam  GEN: No acute distress.  Thin Neck: No JVD Cardiac: RRR, no murmurs, rubs, or gallops.  Respiratory: Moderately decreased breath sounds throughout, scattered Rales GI: Soft, nontender, non-distended  MS: No edema; No  deformity. Neuro:  Nonfocal  Psych: Normal affect   Labs High Sensitivity Troponin:   Recent Labs  Lab 05/10/24 1433 05/10/24 1934  TROPONINIHS 19* 20*     Chemistry Recent Labs  Lab 05/10/24 1433 05/12/24 0504  NA 136 140  K 4.5 3.4*  CL 90* 84*  CO2 37* 43*  GLUCOSE 87 129*  BUN 15 15  CREATININE 0.30* 0.45  CALCIUM 8.5* 8.4*  MG  --  1.8  PROT 6.2*  --   ALBUMIN 3.3*  --   AST 30  --   ALT 19  --   ALKPHOS 53  --   BILITOT 0.8  --   GFRNONAA >60 >60  ANIONGAP 9 13    Lipids No results for input(s): CHOL, TRIG, HDL, LABVLDL, LDLCALC, CHOLHDL in the last 168 hours.  Hematology Recent Labs  Lab 05/10/24 1433 05/11/24 0427  WBC 4.8 4.2  RBC 4.08 3.80*  HGB 10.6* 9.8*  HCT 34.3* 32.4*  MCV 84.1 85.3  MCH 26.0 25.8*  MCHC 30.9 30.2  RDW 13.3 13.3  PLT 184 184   Thyroid  No results for input(s): TSH, FREET4 in the last 168 hours.  BNP Recent Labs  Lab 05/10/24 1433  BNP 347.4*  DDimer No results for input(s): DDIMER in the last 168 hours.   Radiology  DG Chest 2 View Result Date: 05/10/2024 CLINICAL DATA:  Shortness of breath. Bilateral lower leg swelling. History of bronchiectasis, COPD, hypertension and lung nodules. EXAM: CHEST - 2 VIEW COMPARISON:  08/30/2021 and chest CT dated 11/28/2023. FINDINGS: Stable normal sized heart. Stable aortic arch calcifications. No significant change in chronic complete consolidation of the right middle lobe with bronchiectasis and right pleural fluid. No significant change in lingular scarring. Possible mild superimposed airspace opacity in the lingula. Previously demonstrated small probable infectious nodular areas in the left lung are faintly visible. Minimal left pleural effusion. Diffuse osteopenia. IMPRESSION: 1. Possible mild lingular pneumonia superimposed on chronic scarring. 2. No significant change in chronic complete consolidation of the right middle lobe with bronchiectasis and right pleural  fluid. 3. Minimal left pleural effusion. 4. Previously demonstrated small probable infectious nodular areas in the left lung are faintly visible. Electronically Signed   By: Elspeth Bathe M.D.   On: 05/10/2024 17:29    Cardiac Studies   Patient Profile   Breanna Christian is a 83 y.o. female with a hx of chronic respiratory failure, severe COPD, MAI infection, hypertension, aortic stenosis, mitral regurgitation, non-Hodgkin's lymphoma(2021) s/p chemotherapy and radiation, who is being seen 05/11/2024 for the evaluation of shortness of breath, severe aortic valve stenosis  Assessment & Plan  Acute on chronic respiratory failure In the setting of severe COPD with possible exacerbation, acute diastolic CHF exacerbated by severe aortic valve stenosis - Mild improvement in symptoms, still short of breath even at rest  - Would continue IV Lasix  every 8, nebulizers, antibiotics, steroids -Long discussion with daughter concerning the above, severe COPD, severe aortic valve stenosis -Would likely do better if palliative follows at home   Severe aortic valve stenosis Not a candidate for TAVR Evaluated by structural heart team in April 2025 Medical management as above, Lasix  IV every 8 hours today -Stable BMP -At the time of discharge would likely benefit from Lasix  20 daily   Long discussion with daughter at the bedside concerning above Patient is from [place],  language barrier - She uses oxygen  at home Would likely be better if she turned her oxygen  up to 4 L on ambulation around the house then back to 2 L when she recovers with sitting  For questions or updates, please contact [facility] Please consult [email or web address] for contact info under     Signed, Shatonia Hoots, MD  05/12/2024, 9:49 AM

## 2024-05-12 NOTE — Progress Notes (Signed)
 Rounding Note   Patient Name: Breanna Christian Date of Encounter: 05/12/2024  [facility] Cardiologist: Deatrice Cage, MD   Subjective Seen on AM rounds. Denies chest pain. Breathing has improved since yesterday. Close to baseline. -1.5 L output in the last 24 hours. Been up out of bed to and from the bathroom.   Scheduled Meds:  ALPRAZolam   0.5 mg Oral QHS   azithromycin   250 mg Oral Daily   budesonide -glycopyrrolate -formoterol   2 puff Inhalation BID   enoxaparin  (LOVENOX ) injection  40 mg Subcutaneous Q24H   ethambutol   400 mg Oral Daily   furosemide   20 mg Intravenous Q8H   mirtazapine   7.5 mg Oral QHS   potassium chloride   20 mEq Oral Daily   rifampin   300 mg Oral Daily   Continuous Infusions:  PRN Meds: acetaminophen **OR** acetaminophen, ALPRAZolam , ipratropium-albuterol , morphine, ondansetron  **OR** ondansetron  (ZOFRAN ) IV, sodium chloride  HYPERTONIC   Vital Signs  Vitals:   05/11/24 1527 05/11/24 2113 05/12/24 0330 05/12/24 0500  BP: 114/65 127/60 110/60   Pulse: (!) 102 96 (!) 108   Resp: 20 18 20    Temp: 98.4 F (36.9 C) 97.9 F (36.6 C) 98.1 F (36.7 C)   TempSrc:      SpO2: 95% 98% 100%   Weight:    45.4 kg  Height:        Intake/Output Summary (Last 24 hours) at 05/12/2024 0837 Last data filed at 05/12/2024 0700 Gross per 24 hour  Intake 240 ml  Output 1800 ml  Net -1560 ml      05/12/2024    5:00 AM 05/11/2024    1:29 PM 05/11/2024    4:36 AM  Last 3 Weights  Weight (lbs) 100 lb 1.4 oz 107 lb 103 lb 6.3 oz  Weight (kg) 45.4 kg 48.535 kg 46.9 kg      Telemetry Sinus tachycardia with bursts of higher rates for short periods of time - Personally Reviewed  ECG  No new tracings - Personally Reviewed  Physical Exam  GEN: No acute distress.   Neck: No JVD, heart murmur radiates into the carotids Cardiac: RRR, III/VI harsh systolic murmur without rubs, or gallops.  Respiratory: Diminished to auscultation bilaterally. Respirations  are mildly labored close back to baseline on 2L O2 via [place] GI: Soft, nontender, non-distended  MS: Trace edema BLE; No deformity. Neuro:  Nonfocal  Psych: Normal affect   Labs High Sensitivity Troponin:   Recent Labs  Lab 05/10/24 1433 05/10/24 1934  TROPONINIHS 19* 20*     Chemistry Recent Labs  Lab 05/10/24 1433 05/12/24 0504  NA 136 140  K 4.5 3.4*  CL 90* 84*  CO2 37* 43*  GLUCOSE 87 129*  BUN 15 15  CREATININE 0.30* 0.45  CALCIUM 8.5* 8.4*  MG  --  1.8  PROT 6.2*  --   ALBUMIN 3.3*  --   AST 30  --   ALT 19  --   ALKPHOS 53  --   BILITOT 0.8  --   GFRNONAA >60 >60  ANIONGAP 9 13    Lipids No results for input(s): CHOL, TRIG, HDL, LABVLDL, LDLCALC, CHOLHDL in the last 168 hours.  Hematology Recent Labs  Lab 05/10/24 1433 05/11/24 0427  WBC 4.8 4.2  RBC 4.08 3.80*  HGB 10.6* 9.8*  HCT 34.3* 32.4*  MCV 84.1 85.3  MCH 26.0 25.8*  MCHC 30.9 30.2  RDW 13.3 13.3  PLT 184 184   Thyroid  No results for input(s): TSH, FREET4  in the last 168 hours.  BNP Recent Labs  Lab 05/10/24 1433  BNP 347.4*    DDimer No results for input(s): DDIMER in the last 168 hours.   Radiology  DG Chest 2 View Result Date: 05/10/2024 CLINICAL DATA:  Shortness of breath. Bilateral lower leg swelling. History of bronchiectasis, COPD, hypertension and lung nodules. EXAM: CHEST - 2 VIEW COMPARISON:  08/30/2021 and chest CT dated 11/28/2023. FINDINGS: Stable normal sized heart. Stable aortic arch calcifications. No significant change in chronic complete consolidation of the right middle lobe with bronchiectasis and right pleural fluid. No significant change in lingular scarring. Possible mild superimposed airspace opacity in the lingula. Previously demonstrated small probable infectious nodular areas in the left lung are faintly visible. Minimal left pleural effusion. Diffuse osteopenia. IMPRESSION: 1. Possible mild lingular pneumonia superimposed on chronic scarring. 2.  No significant change in chronic complete consolidation of the right middle lobe with bronchiectasis and right pleural fluid. 3. Minimal left pleural effusion. 4. Previously demonstrated small probable infectious nodular areas in the left lung are faintly visible. Electronically Signed   By: Elspeth Bathe M.D.   On: 05/10/2024 17:29    Cardiac Studies Echo 01/2024  1. Left ventricular ejection fraction, by estimation, is 60 to 65%. The  left ventricle has normal function. The left ventricle has no regional  wall motion abnormalities. Left ventricular diastolic parameters are  indeterminate.   2. Right ventricular systolic function is normal. The right ventricular  size is normal. There is mildly elevated pulmonary artery systolic  pressure. The estimated right ventricular systolic pressure is 38.2 mmHg.   3. The mitral valve is normal in structure. Moderate to severe mitral  valve regurgitation. Mild to moderate mitral stenosis. The mean mitral  valve gradient is 8.0 mmHg. Severe mitral annular calcification.   4. Tricuspid valve regurgitation is mild to moderate.   5. The aortic valve is normal in structure. There is severe calcifcation  of the aortic valve. Aortic valve regurgitation is mild. Severe aortic  valve stenosis. Aortic valve area, by VTI measures 0.30 cm. Aortic valve  mean gradient measures 42.0 mmHg.  Aortic valve Vmax measures 4.64 m/s.   6. The inferior vena cava is normal in size with greater than 50%  respiratory variability, suggesting right atrial pressure of 3 mmHg.     Patient Profile   83 y.o. female with past medical history of chronic respiratory failure, severe COPD, MAI infection, hypertension, severe aortic stenosis, mitral regurgitation, non-Hodgkin's lymphoma (2021) status post chemotherapy and radiation who is being seen and evaluated for worsening shortness of breath.  Assessment & Plan  Acute on chronic HFpEF/severe aortic stenosis -Presented with  shortness of breath and peripheral edema -BNP 347.4 -Continued on furosemide  20 mg every 8 hours -Daily BMP while on diuretic therapy -Evaluated by structural heart team and deemed not a candidate for TAVR given advanced lung disease and high risk to low benefit surgery --1.5 L output in the last 24 hours - Appears to be euvolemic on exam and breathing is almost back to baseline -Daily weights and I's and O's - Palliative care consultation completed yesterday -Patient would likely require low-dose diuretic on discharge -Bisoprolol 2.5 mg twice daily started today  COPD with acute exacerbation/possible pneumonia/chronic respiratory failure with hypoxia -Patient with chronic shortness of breath on 2 L of O2 via nasal cannula at home -Chest x-ray showed possible acute new infiltrates on chronic scarring -Continued on antibiotics steroids and scheduled bronchodilators -Supportive care -Patient and daughter  believe breathing has improved over the last 24 hours -Ongoing management per IM Primary hypertension -Blood pressure -Continued on furosemide  20 mg every 8 hours IV -Vital signs per unit protocol  Hypokalemia -Serum potassium 3.4 -Potassium supplementation given -Recommend potassium 20 mill equivalents daily while receiving diuretic therapy -Monitor/trend/replete electrolytes as needed -Daily BMP  Normocytic anemia -Stable hemoglobin -Trend CBC   For questions or updates, please contact [facility] Please consult [email or web address] for contact info under     Signed, Elgar Scoggins, NP  05/12/2024, 8:37 AM

## 2024-05-12 NOTE — Progress Notes (Signed)
  PROGRESS NOTE    Breanna Christian  FMW:[id number] DOB: Jul 02, 1941 DOA: 05/10/2024 PCP: Fernand Sigrid HERO, MD  [id number]  LOS: 2 days   Brief hospital course:   Assessment & Plan: 83 y.o. F with severe aortic stenosis, COPD, Sjogren's, bronchiectasis, on 2L home O2, hx NHL s/p chemoXRT and hx MAI who presented with increased leg swelling and SOB.   Acute on chronic diastolic CHF (congestive heart failure) (HCC) Severe aortic stenosis Echo last Mar with normal EF, severe AS.  Saw Cardiology last April, not a candidate for TAVR given advanced lung disease, high risk to low benefit given her lung disease.  No on diuretics at home. --started on IV lasix  with symptomatic improvement. --cardio consulted --cont IV lasix  20 q8h --low sodium diet and fluid restriction 1500   COPD, ruled out exacerbation Chronic hypoxemic respiratory failure on 2L O2 --no wheezing, increased cough, increased sputum to suggest COPD exacerbation.   --cont daily bronchodilators  PNA, ruled out --procal neg, WBC wnl.  No fever, or cough with sputum.  Abx d/c'ed.  MAI (mycobacterium avium-intracellulare) infection (HCC) On longterm treatment with Dr. Fernand - on home azithromycin , ethambutol , rifampin  --cont home abx regimen   Essential hypertension BP normal.  Not on medications any more at home --cont IV lasix    Normocytic anemia Hgb stable, no clinical bleeding reported or observed   Sjogren syndrome (HCC) Diuretics for CHF had previously been held because of her dry eyes and dry mouth.     DVT prophylaxis: Lovenox  SQ Code Status: DNR  Family Communication:  Level of care: Med-Surg Dispo:   The patient is from: home Anticipated d/c is to: home Anticipated d/c date is: 1-2 days   Subjective and Interval History:  Pt reported leg swelling much improved.   Objective: Vitals:   05/12/24 0330 05/12/24 0500 05/12/24 0906 05/12/24 1233  BP: 110/60  (!) 105/59 116/66  Pulse: (!) 108  100 94   Resp: 20  19 18   Temp: 98.1 F (36.7 C)  98.3 F (36.8 C) 98.3 F (36.8 C)  TempSrc:   Oral Oral  SpO2: 100%  98% 100%  Weight:  45.4 kg    Height:        Intake/Output Summary (Last 24 hours) at 05/12/2024 1515 Last data filed at 05/12/2024 1300 Gross per 24 hour  Intake 600 ml  Output 2100 ml  Net -1500 ml   Filed Weights   05/11/24 0436 05/11/24 1329 05/12/24 0500  Weight: 46.9 kg 48.5 kg 45.4 kg    Examination:   Constitutional: NAD, AAOx3 HEENT: conjunctivae and lids normal, EOMI CV: No cyanosis.   RESP: normal respiratory effort, on 2L Extremities: No effusions, edema in BLE SKIN: warm, dry Neuro: II - XII grossly intact.   Psych: Normal mood and affect.     Data Reviewed: I have personally reviewed labs and imaging studies  Time spent: 35 minutes  Ellouise Haber, MD [facility] If 7PM-7AM, please contact night-coverage 05/12/2024, 3:15 PM

## 2024-05-12 NOTE — Progress Notes (Signed)
 Daily Progress Note   Patient Name: Breanna Christian       Date: 05/12/2024 DOB: [birth year]  Age: 83 y.o. MRN#: [id number] Attending Physician: Awanda City, MD Primary Care Physician: Fernand Sigrid HERO, MD Admit Date: 05/10/2024  Reason for Consultation/Follow-up: Establishing goals of care  HPI/Brief Hospital Review: 83 y.o. female  with past medical history of severe aortic stenosis (not a candidate for TAVR), CHF, COPD, Sjogren;s, bronchiectasis, chronic respiratory failure with hypoxia on 2L [place] at baseline, MAI currently on triple antibiotic therapy, history of non hodgkin lymphoma s/p chemo and radiation therapy admitted from home on 05/10/2024 with increased bilateral lower extremity edema and increased SHOB.    Admitted and being treated for CHF exacerbation, COPD exacerbation-requiring IV lasix  therapy PNA ruled out   Palliative medicine was consulted for assisting with goals of care conversations.  Subjective: Extensive chart review has been completed prior to meeting patient including labs, vital signs, imaging, progress notes, orders, and available advanced directive documents from current and previous encounters.    Visited with [name] at her bedside. She is sitting up in bed, awake, alert and able to engage in conversation. She reports feeling well today, no acute complaints, she was able to sleep and ate most of her breakfast. Her daughter is set to return later in the morning.  Returned to bedside when daughter-Eleni visiting. [name] continues to report feeling well, feels as though she is close to her baseline, BLE edema much improved. Eleni is appreciative of visit from hospice liaison yesterday, at this time they are not ready to pursue hospice services. We discussed the  role of outpatient palliative care for which they agreed to referral.  Answered and addressed all questions and concerns. No further ongoing acute palliative needs-TOC order placed to make referral for outpatient palliative services. PMT to step away from daily visits, please reengage if goals/plans change or need to be readdressed.  Primary team, TOC and hospice liaison made aware of plan.  Thank you for allowing the Palliative Medicine Team to assist in the care of this patient.  Total time:  35 minutes  Time spent includes: Detailed review of medical records (labs, imaging, vital signs), medically appropriate exam (mental status, respiratory, cardiac, skin), discussed with treatment team, counseling and educating patient, family and staff, documenting clinical information,  medication management and coordination of care.  Waddell Lesches, DNP, AGNP-C Palliative Medicine   Please contact Palliative Medicine Team phone at [phone] for questions and concerns.

## 2024-05-12 NOTE — TOC Progression Note (Signed)
 Transition of Care [facility]) - Progression Note    Patient Details  Name: Breanna Christian MRN: [id number] Date of Birth: [birth year]  [facility] CM/SW Contact  Marinda Cooks, RN Phone Number: 05/12/2024, 4:10 PM  Clinical Narrative:    This CM updated by liaison Lonell with palliative / hospice palliative services will be coordinated for dc plan. TOC will cont to follow dc planning /' care coordination and update as applicable.         Expected Discharge Plan and Services                                               Social Determinants of Health (SDOH) Interventions SDOH Screenings   Food Insecurity: No Food Insecurity (05/11/2024)  Housing: Low Risk  (05/11/2024)  Transportation Needs: No Transportation Needs (05/11/2024)  Utilities: Not At Risk (05/11/2024)  Alcohol Screen: Low Risk  (04/08/2021)  Depression (PHQ2-9): Low Risk  (03/05/2024)  Social Connections: Moderately Isolated (05/11/2024)  Tobacco Use: Low Risk  (05/10/2024)    Readmission Risk Interventions     No data to display

## 2024-05-13 DIAGNOSIS — I5033 Acute on chronic diastolic (congestive) heart failure: Secondary | ICD-10-CM | POA: Diagnosis not present

## 2024-05-13 LAB — BASIC METABOLIC PANEL WITH GFR
Anion gap: 13 (ref 5–15)
BUN: 19 mg/dL (ref 8–23)
CO2: 41 mmol/L — ABNORMAL HIGH (ref 22–32)
Calcium: 8.6 mg/dL — ABNORMAL LOW (ref 8.9–10.3)
Chloride: 83 mmol/L — ABNORMAL LOW (ref 98–111)
Creatinine, Ser: 0.5 mg/dL (ref 0.44–1.00)
GFR, Estimated: >60 mL/min (ref >60)
Glucose, Bld: 98 mg/dL (ref 70–99)
Potassium: 3.7 mmol/L (ref 3.5–5.1)
Sodium: 137 mmol/L (ref 135–145)

## 2024-05-13 LAB — MAGNESIUM: Magnesium: 2 mg/dL (ref 1.7–2.4)

## 2024-05-13 MED ORDER — FUROSEMIDE 20 MG PO TABS
20.0000 mg | ORAL_TABLET | Freq: Every day | ORAL | Status: DC
Start: 2024-05-14 — End: 2024-05-14

## 2024-05-13 NOTE — Plan of Care (Signed)
  Problem: Clinical Measurements: Goal: Ability to maintain clinical measurements within normal limits will improve Outcome: Progressing Goal: Will remain free from infection Outcome: Progressing Goal: Diagnostic test results will improve Outcome: Progressing Goal: Respiratory complications will improve Outcome: Progressing Goal: Cardiovascular complication will be avoided Outcome: Progressing   Problem: Activity: Goal: Risk for activity intolerance will decrease Outcome: Progressing   Problem: Nutrition: Goal: Adequate nutrition will be maintained Outcome: Progressing   Problem: Coping: Goal: Level of anxiety will decrease Outcome: Progressing   Problem: Elimination: Goal: Will not experience complications related to bowel motility Outcome: Progressing Goal: Will not experience complications related to urinary retention Outcome: Progressing   Problem: Pain Managment: Goal: General experience of comfort will improve and/or be controlled Outcome: Progressing   Problem: Safety: Goal: Ability to remain free from injury will improve Outcome: Progressing   Problem: Skin Integrity: Goal: Risk for impaired skin integrity will decrease Outcome: Progressing   Problem: Education: Goal: Ability to demonstrate management of disease process will improve Outcome: Progressing Goal: Ability to verbalize understanding of medication therapies will improve Outcome: Progressing Goal: Individualized Educational Video(s) Outcome: Progressing   Problem: Activity: Goal: Capacity to carry out activities will improve Outcome: Progressing   Problem: Cardiac: Goal: Ability to achieve and maintain adequate cardiopulmonary perfusion will improve Outcome: Progressing   Problem: Education: Goal: Knowledge of disease or condition will improve Outcome: Progressing Goal: Knowledge of the prescribed therapeutic regimen will improve Outcome: Progressing Goal: Individualized Educational  Video(s) Outcome: Progressing   Problem: Activity: Goal: Ability to tolerate increased activity will improve Outcome: Progressing Goal: Will verbalize the importance of balancing activity with adequate rest periods Outcome: Progressing   Problem: Respiratory: Goal: Ability to maintain a clear airway will improve Outcome: Progressing Goal: Levels of oxygenation will improve Outcome: Progressing Goal: Ability to maintain adequate ventilation will improve Outcome: Progressing   Problem: Activity: Goal: Ability to tolerate increased activity will improve Outcome: Progressing   Problem: Clinical Measurements: Goal: Ability to maintain a body temperature in the normal range will improve Outcome: Progressing   Problem: Respiratory: Goal: Ability to maintain adequate ventilation will improve Outcome: Progressing Goal: Ability to maintain a clear airway will improve Outcome: Progressing

## 2024-05-13 NOTE — Progress Notes (Signed)
  PROGRESS NOTE    Breanna Christian  FMW:[id number] DOB: December 12, 1940 DOA: 05/10/2024 PCP: Fernand Sigrid HERO, MD  [id number]  LOS: 3 days   Brief hospital course:   Assessment & Plan: 83 y.o. F with severe aortic stenosis, COPD, Sjogren's, bronchiectasis, on 2L home O2, hx NHL s/p chemoXRT and hx MAI who presented with increased leg swelling and SOB.   Acute on chronic diastolic CHF (congestive heart failure) (HCC) Severe aortic stenosis Echo last Mar with normal EF, severe AS.  Saw Cardiology last April, not a candidate for TAVR given advanced lung disease, high risk to low benefit given her lung disease.  No on diuretics at home. --started on IV lasix  with symptomatic improvement. --cardio consulted, started on cont IV lasix  20 q8h --cont bisoprolol --low sodium diet and fluid restriction 1500   COPD, ruled out exacerbation Chronic hypoxemic respiratory failure on 2L O2 --no wheezing, increased cough, increased sputum to suggest COPD exacerbation.   --cont daily bronchodilators --cont home 2L O2  PNA, ruled out --procal neg, WBC wnl.  No fever, or cough with sputum.  Abx d/c'ed.  MAI (mycobacterium avium-intracellulare) infection (HCC) On longterm treatment with Dr. Fernand - on home azithromycin , ethambutol , rifampin  --cont home abx regimen   Essential hypertension BP normal.  Not on medications any more at home --cont IV lasix    Normocytic anemia Hgb stable, no clinical bleeding reported or observed   Sjogren syndrome (HCC) Diuretics for CHF had previously been held because of her dry eyes and dry mouth.     DVT prophylaxis: Lovenox  SQ Code Status: DNR  Family Communication: daughter updated at bedside today Level of care: Med-Surg Dispo:   The patient is from: home Anticipated d/c is to: home Anticipated d/c date is: tomorrow   Subjective and Interval History:  Pt reported dyspnea improved.   Objective: Vitals:   05/13/24 0500 05/13/24 0744 05/13/24 1159  05/13/24 1546  BP:  109/61 108/71 112/70  Pulse:  83 81 87  Resp:      Temp:  98.1 F (36.7 C) 98.2 F (36.8 C) 98.8 F (37.1 C)  TempSrc:      SpO2:  99% 100% 99%  Weight: 45.6 kg     Height:        Intake/Output Summary (Last 24 hours) at 05/13/2024 1740 Last data filed at 05/13/2024 1300 Gross per 24 hour  Intake 237 ml  Output 900 ml  Net -663 ml   Filed Weights   05/11/24 1329 05/12/24 0500 05/13/24 0500  Weight: 48.5 kg 45.4 kg 45.6 kg    Examination:   Constitutional: NAD, AAOx3 HEENT: conjunctivae and lids normal, EOMI CV: No cyanosis.   RESP: normal respiratory effort, on 2L Extremities: No effusions, edema in BLE SKIN: warm, dry Neuro: II - XII grossly intact.   Psych: Normal mood and affect.     Data Reviewed: I have personally reviewed labs and imaging studies  Time spent: 35 minutes  Ellouise Haber, MD [facility] If 7PM-7AM, please contact night-coverage 05/13/2024, 5:40 PM

## 2024-05-13 NOTE — Progress Notes (Signed)
 Rounding Note   Patient Name: Breanna Christian Date of Encounter: 05/13/2024  [facility] Cardiologist: Deatrice Cage, MD   Subjective Patient reports improvements in swelling and dyspnea. She is feeling back to baseline. She is able to ambulate to the bathroom without difficulty. I/Os poorly recorded. Kidney function is stable.   Scheduled Meds:  ALPRAZolam   0.5 mg Oral QHS   azithromycin   250 mg Oral Daily   bisoprolol  2.5 mg Oral BID   budesonide -glycopyrrolate -formoterol   2 puff Inhalation BID   enoxaparin  (LOVENOX ) injection  40 mg Subcutaneous Q24H   ethambutol   400 mg Oral Daily   furosemide   20 mg Intravenous Q8H   mirtazapine   7.5 mg Oral QHS   potassium chloride   20 mEq Oral Daily   rifampin   300 mg Oral Daily   Continuous Infusions:  PRN Meds: acetaminophen **OR** acetaminophen, ALPRAZolam , ipratropium-albuterol , morphine, ondansetron  **OR** ondansetron  (ZOFRAN ) IV, sodium chloride  HYPERTONIC   Vital Signs  Vitals:   05/13/24 0321 05/13/24 0500 05/13/24 0744 05/13/24 1159  BP: 99/70  109/61 108/71  Pulse: 86  83 81  Resp: 18     Temp: 98 F (36.7 C)  98.1 F (36.7 C) 98.2 F (36.8 C)  TempSrc:      SpO2: 98%  99% 100%  Weight:  45.6 kg    Height:        Intake/Output Summary (Last 24 hours) at 05/13/2024 1413 Last data filed at 05/13/2024 0900 Gross per 24 hour  Intake 237 ml  Output 900 ml  Net -663 ml      05/13/2024    5:00 AM 05/12/2024    5:00 AM 05/11/2024    1:29 PM  Last 3 Weights  Weight (lbs) 100 lb 8.5 oz 100 lb 1.4 oz 107 lb  Weight (kg) 45.6 kg 45.4 kg 48.535 kg      Telemetry Sinus rhythm rate 80-100 bpm - Personally Reviewed  Physical Exam  GEN: No acute distress.   Neck: No JVD Cardiac: III/VI systolic murmur; RRR, no rubs or gallops.  Respiratory: Diminished breath sounds throughout GI: Soft, nontender, non-distended  MS: No edema; No deformity. Neuro:  Nonfocal  Psych: Normal affect   Labs High  Sensitivity Troponin:   Recent Labs  Lab 05/10/24 1433 05/10/24 1934  TROPONINIHS 19* 20*     Chemistry Recent Labs  Lab 05/10/24 1433 05/12/24 0504 05/13/24 0300  NA 136 140 137  K 4.5 3.4* 3.7  CL 90* 84* 83*  CO2 37* 43* 41*  GLUCOSE 87 129* 98  BUN 15 15 19   CREATININE 0.30* 0.45 0.50  CALCIUM 8.5* 8.4* 8.6*  MG  --  1.8 2.0  PROT 6.2*  --   --   ALBUMIN 3.3*  --   --   AST 30  --   --   ALT 19  --   --   ALKPHOS 53  --   --   BILITOT 0.8  --   --   GFRNONAA >60 >60 >60  ANIONGAP 9 13 13     Lipids No results for input(s): CHOL, TRIG, HDL, LABVLDL, LDLCALC, CHOLHDL in the last 168 hours.  Hematology Recent Labs  Lab 05/10/24 1433 05/11/24 0427  WBC 4.8 4.2  RBC 4.08 3.80*  HGB 10.6* 9.8*  HCT 34.3* 32.4*  MCV 84.1 85.3  MCH 26.0 25.8*  MCHC 30.9 30.2  RDW 13.3 13.3  PLT 184 184   Thyroid  No results for input(s): TSH, FREET4 in the last  168 hours.  BNP Recent Labs  Lab 05/10/24 1433  BNP 347.4*    DDimer No results for input(s): DDIMER in the last 168 hours.   Radiology  No results found.  Cardiac Studies Echo 01/2024  1. Left ventricular ejection fraction, by estimation, is 60 to 65%. The  left ventricle has normal function. The left ventricle has no regional  wall motion abnormalities. Left ventricular diastolic parameters are  indeterminate.   2. Right ventricular systolic function is normal. The right ventricular  size is normal. There is mildly elevated pulmonary artery systolic  pressure. The estimated right ventricular systolic pressure is 38.2 mmHg.   3. The mitral valve is normal in structure. Moderate to severe mitral  valve regurgitation. Mild to moderate mitral stenosis. The mean mitral  valve gradient is 8.0 mmHg. Severe mitral annular calcification.   4. Tricuspid valve regurgitation is mild to moderate.   5. The aortic valve is normal in structure. There is severe calcifcation  of the aortic valve. Aortic valve  regurgitation is mild. Severe aortic  valve stenosis. Aortic valve area, by VTI measures 0.30 cm. Aortic valve  mean gradient measures 42.0 mmHg.  Aortic valve Vmax measures 4.64 m/s.   6. The inferior vena cava is normal in size with greater than 50%  respiratory variability, suggesting right atrial pressure of 3 mmHg  Patient Profile   83 y.o. female with past medical history of chronic respiratory failure, severe COPD, MAI infection, hypertension, severe aortic stenosis, mitral regurgitation, non-Hodgkin's lymphoma (2021) status post chemotherapy and radiation who is being seen and evaluated for worsening shortness of breath.   Assessment & Plan  Acute on chronic HFpEF Severe aortic stenosis - Initially presenting with shortness of breath and peripheral edema - BNP 347 - Continued on furosemide  20 mg every 8 hours with poorly recorded I's/O's - Continue to monitor kidney function, strict I's/O's, and daily weights with ongoing diuresis - Plan to transition from IV to oral Lasix  20 mg daily tomorrow - Evaluated by structural heart team and deemed not to be a candidate for TAVR given advanced lung disease and high risk to low benefit surgery. - Appears to be euvolemic on exam with breathing back to baseline - Palliative care consultation completed yesterday - Continue bisoprolol 2.5 mg twice daily, unable to titrate further due to hypotension  COPD with acute exacerbation Possible pneumonia Chronic respiratory failure with hypoxia - Chronic shortness of breath on 2 L supplemental oxygen  at home - Respiratory status appears to be improving - Chest x-ray showed possible acute new infiltrates on chronic scarring - Continued on antibiotics, steroids, and bronchodilators per IM  Primary hypertension - Blood pressure stable - Continue bisoprolol as above  Hypokalemia - Recommend supplementation for K greater than 4  For questions or updates, please contact [facility] Please consult [email or web address] for contact info under     Signed, Lesley LITTIE Maffucci, PA-C  05/13/2024, 2:13 PM

## 2024-05-14 DIAGNOSIS — I5033 Acute on chronic diastolic (congestive) heart failure: Secondary | ICD-10-CM | POA: Diagnosis not present

## 2024-05-14 DIAGNOSIS — I350 Nonrheumatic aortic (valve) stenosis: Secondary | ICD-10-CM | POA: Diagnosis not present

## 2024-05-14 LAB — BASIC METABOLIC PANEL WITH GFR
BUN: 26 mg/dL — ABNORMAL HIGH (ref 8–23)
CO2: 39 mmol/L — ABNORMAL HIGH (ref 22–32)
Calcium: 8.8 mg/dL — ABNORMAL LOW (ref 8.9–10.3)
Chloride: 85 mmol/L — ABNORMAL LOW (ref 98–111)
Creatinine, Ser: 0.44 mg/dL (ref 0.44–1.00)
GFR, Estimated: >60 mL/min (ref >60)
Glucose, Bld: 95 mg/dL (ref 70–99)
Potassium: 3.6 mmol/L (ref 3.5–5.1)
Sodium: 141 mmol/L (ref 135–145)

## 2024-05-14 MED ORDER — FUROSEMIDE 40 MG PO TABS
40.0000 mg | ORAL_TABLET | Freq: Every day | ORAL | Status: DC
Start: 2024-05-14 — End: 2024-05-14
  Administered 2024-05-14: 40 mg via ORAL
  Filled 2024-05-14: qty 1

## 2024-05-14 MED ORDER — BISOPROLOL FUMARATE 2.5 MG PO TABS: 2.5000 mg | ORAL_TABLET | Freq: Two times a day (BID) | ORAL | 2 refills | Status: DC

## 2024-05-14 MED ORDER — FUROSEMIDE 40 MG PO TABS: 40.0000 mg | ORAL_TABLET | Freq: Every day | ORAL | 2 refills | Status: DC

## 2024-05-14 NOTE — Discharge Summary (Signed)
 Physician Discharge Summary   Breanna Christian  female DOB: [birth year]  FMW:[id number]  PCP: Fernand Sigrid HERO, MD  Admit date: 05/10/2024 Discharge date: 05/14/2024  Admitted From: home Disposition:  home Daughter updated at bedside prior to discharge. Home Health: Yes CODE STATUS: DNR  Discharge Instructions     Diet - low sodium heart healthy   Complete by: As directed    1500 cc fluid per day.      Hospital Course:  For full details, please see H&P, progress notes, consult notes and ancillary notes.  Briefly,  Breanna Christian is a 83 y.o. F with severe aortic stenosis, COPD, Sjogren's, bronchiectasis, on 2L home O2, hx NHL s/p chemoXRT and hx MAI who presented with increased leg swelling and SOB.    Acute on chronic diastolic CHF (congestive heart failure) (HCC) Severe aortic stenosis Echo last Mar with normal EF, severe AS.  Saw Cardiology last April, not a candidate for TAVR given advanced lung disease, high risk to low benefit given her lung disease.  Not on diuretics at home. --started on IV lasix  with symptomatic improvement. --cardio consulted, started on cont IV lasix  20 q8h, and discharged on oral lasix  40 mg daily. --started on  bisoprolol  --low sodium diet and fluid restriction 1500 ml per day.   COPD, ruled out exacerbation Chronic hypoxemic respiratory failure on 2L O2 --no wheezing, increased cough, increased sputum to suggest COPD exacerbation.   --cont home daily bronchodilators and PRN nebs --cont home 2L O2   PNA, ruled out --procal neg, WBC wnl.  No fever, or cough with sputum.  Abx d/c'ed.   MAI (mycobacterium avium-intracellulare) infection (HCC) On longterm treatment with Dr. Fernand - cont home azithromycin , ethambutol , rifampin    Essential hypertension BP normal.  Not on medications any more at home --Started on bisprolol and lasix     Normocytic anemia Hgb stable, no clinical bleeding reported or observed   Sjogren syndrome  (HCC) Diuretics for CHF had previously been held because of her dry eyes and dry mouth.   Discharge Diagnoses:  Principal Problem:   Acute on chronic diastolic CHF (congestive heart failure) (HCC) Active Problems:   Severe aortic stenosis   MAI (mycobacterium avium-intracellulare) infection (HCC)   Essential hypertension   Chronic respiratory failure with hypoxia (HCC)   COPD exacerbation (HCC)   Sjogren syndrome (HCC)   Normocytic anemia   SOB (shortness of breath)   Edema   30 Day Unplanned Readmission Risk Score    Flowsheet Row ED to Hosp-Admission (Current) from 05/10/2024 in [facility]  30 Day Unplanned Readmission Risk Score (%) 14.42 Filed at 05/14/2024 0801    This score is the patient's risk of an unplanned readmission within 30 days of being discharged (0 -100%). The score is based on dignosis, age, lab data, medications, orders, and past utilization.   Low:  0-14.9   Medium: 15-21.9   High: 22-29.9   Extreme: 30 and above         Discharge Instructions:  Allergies as of 05/14/2024       Reactions   Prednisone  Other (See Comments)   Swells my gums but pt still takes it if needed        Medication List     TAKE these medications    albuterol  108 (90 Base) MCG/ACT inhaler Commonly known as: Ventolin  HFA Inhale 2 puffs into the lungs every 6 (six) hours as needed for wheezing or shortness of breath. What changed: when  to take this   ALPRAZolam  0.5 MG tablet Commonly known as: XANAX  Take half tab in am and one at night   ALPRAZolam  0.25 MG tablet Commonly known as: XANAX  Take 0.25 mg by mouth 3 (three) times daily as needed.   ARTIFICIAL TEAR OP Place 1 drop into both eyes every 6 (six) hours as needed (dry eyes). A product from [place].   azithromycin  250 MG tablet Commonly known as: ZITHROMAX  Take 1 tablet (250 mg total) by mouth daily.   Bisoprolol  Fumarate 2.5 MG Tabs Take 2.5 mg by mouth 2 (two) times daily.    Breztri  Aerosphere 160-9-4.8 MCG/ACT Aero inhaler Generic drug: budesonide -glycopyrrolate -formoterol  Inhale 2 puffs into the lungs 2 (two) times daily.   ethambutol  400 MG tablet Commonly known as: MYAMBUTOL  Take 1 tablet (400 mg total) by mouth daily.   furosemide  40 MG tablet Commonly known as: LASIX  Take 1 tablet (40 mg total) by mouth daily. Start taking on: May 15, 2024   ibandronate  150 MG tablet Commonly known as: BONIVA  TAKE 1 TABLET (150 MG TOTAL) BY MOUTH EVERY 30 (THIRTY) DAYS. TAKE IN THE MORNING WITH A FULL GLASS OF WATER, ON AN EMPTY STOMACH, AND DO NOT TAKE ANYTHING ELSE BY MOUTH OR LIE DOWN FOR THE NEXT 30 MIN.   ibuprofen 200 MG tablet Commonly known as: ADVIL Take 200 mg by mouth as needed.   ipratropium-albuterol  0.5-2.5 (3) MG/3ML Soln Commonly known as: DUONEB Take 3 mLs by nebulization every 2 (two) hours as needed. DX: J44.9   Lifitegrast 5 % Soln Apply 1 drop to eye in the morning and at bedtime. 1 Gtt both eyes twice a day   mirtazapine  7.5 MG tablet Commonly known as: REMERON  Take 7.5 mg by mouth at bedtime.   Multi-Vitamins Tabs Take 1 tablet by mouth daily. Reported on 04/25/2016   Omega-3 Fish Oil 300 MG Caps Take by mouth daily.   rifampin  150 MG capsule Commonly known as: RIFADIN  TAKE 1 CAPSULE DAILY   sodium chloride  HYPERTONIC 3 % nebulizer solution Take by nebulization as needed for other.         Follow-up Information     Khan, Fozia M, MD Follow up in 1 week(s).   Specialties: Internal Medicine, Cardiology Contact information: [address] [place] [address] [phone]                 Allergies  Allergen Reactions   Prednisone  Other (See Comments)    Swells my gums but pt still takes it if needed     The results of significant diagnostics from this hospitalization (including imaging, microbiology, ancillary and laboratory) are listed below for reference.    Consultations:   Procedures/Studies: DG Chest 2 View Result Date: 05/10/2024 CLINICAL DATA:  Shortness of breath. Bilateral lower leg swelling. History of bronchiectasis, COPD, hypertension and lung nodules. EXAM: CHEST - 2 VIEW COMPARISON:  08/30/2021 and chest CT dated 11/28/2023. FINDINGS: Stable normal sized heart. Stable aortic arch calcifications. No significant change in chronic complete consolidation of the right middle lobe with bronchiectasis and right pleural fluid. No significant change in lingular scarring. Possible mild superimposed airspace opacity in the lingula. Previously demonstrated small probable infectious nodular areas in the left lung are faintly visible. Minimal left pleural effusion. Diffuse osteopenia. IMPRESSION: 1. Possible mild lingular pneumonia superimposed on chronic scarring. 2. No significant change in chronic complete consolidation of the right middle lobe with bronchiectasis and right pleural fluid. 3. Minimal left pleural effusion. 4. Previously demonstrated small  probable infectious nodular areas in the left lung are faintly visible. Electronically Signed   By: Elspeth Bathe M.D.   On: 05/10/2024 17:29      Labs: BNP (last 3 results) Recent Labs    05/10/24 1433  BNP 347.4*   Basic Metabolic Panel: Recent Labs  Lab 05/10/24 1433 05/12/24 0504 05/13/24 0300 05/14/24 0453  NA 136 140 137 141  K 4.5 3.4* 3.7 3.6  CL 90* 84* 83* 85*  CO2 37* 43* 41* 39*  GLUCOSE 87 129* 98 95  BUN 15 15 19  26*  CREATININE 0.30* 0.45 0.50 0.44  CALCIUM 8.5* 8.4* 8.6* 8.8*  MG  --  1.8 2.0  --    Liver Function Tests: Recent Labs  Lab 05/10/24 1433  AST 30  ALT 19  ALKPHOS 53  BILITOT 0.8  PROT 6.2*  ALBUMIN 3.3*   No results for input(s): LIPASE, AMYLASE in the last 168 hours. No results for input(s): AMMONIA in the last 168 hours. CBC: Recent Labs  Lab 05/10/24 1433 05/11/24 0427  WBC 4.8 4.2  HGB 10.6* 9.8*  HCT 34.3* 32.4*  MCV 84.1  85.3  PLT 184 184   Cardiac Enzymes: No results for input(s): CKTOTAL, CKMB, CKMBINDEX, TROPONINI in the last 168 hours. BNP: Invalid input(s): POCBNP CBG: No results for input(s): GLUCAP in the last 168 hours. D-Dimer No results for input(s): DDIMER in the last 72 hours. Hgb A1c No results for input(s): HGBA1C in the last 72 hours. Lipid Profile No results for input(s): CHOL, HDL, LDLCALC, TRIG, CHOLHDL, LDLDIRECT in the last 72 hours. Thyroid  function studies No results for input(s): TSH, T4TOTAL, T3FREE, THYROIDAB in the last 72 hours.  Invalid input(s): FREET3 Anemia work up No results for input(s): VITAMINB12, FOLATE, FERRITIN, TIBC, IRON, RETICCTPCT in the last 72 hours. Urinalysis    Component Value Date/Time   APPEARANCEUR Clear 12/12/2023 1523   GLUCOSEU Negative 12/12/2023 1523   BILIRUBINUR Negative 12/12/2023 1523   PROTEINUR Negative 12/12/2023 1523   UROBILINOGEN 0.2 11/27/2023 1511   NITRITE Negative 12/12/2023 1523   LEUKOCYTESUR 1+ (A) 12/12/2023 1523   Sepsis Labs Recent Labs  Lab 05/10/24 1433 05/11/24 0427  WBC 4.8 4.2   Microbiology No results found for this or any previous visit (from the past 240 hours).   Total time spend on discharging this patient, including the last patient exam, discussing the hospital stay, instructions for ongoing care as it relates to all pertinent caregivers, as well as preparing the medical discharge records, prescriptions, and/or referrals as applicable, is 35 minutes.    Ellouise Haber, MD  [facility] 05/14/2024, 9:55 AM

## 2024-05-14 NOTE — TOC Transition Note (Signed)
 Transition of Care [facility]) - Discharge Note   Patient Details  Name: Breanna Christian MRN: [id number] Date of Birth: [birth year]  [facility] CM/SW Contact:  Jasson Siegmann C Shariq Puig, RN Phone Number: 05/14/2024, 10:50 AM   Clinical Narrative:    Spoke with family and patient at bedside. Family has elected not to have hospice at this time but to have HH.  Patient daughter stated patient is already with [facility].   Shaun from [facility] notified of discharge.  TOC signing off.           Patient Goals and CMS Choice            Discharge Placement                       Discharge Plan and Services Additional resources added to the After Visit Summary for                                       Social Drivers of Health (SDOH) Interventions SDOH Screenings   Food Insecurity: No Food Insecurity (05/11/2024)  Housing: Low Risk  (05/11/2024)  Transportation Needs: No Transportation Needs (05/11/2024)  Utilities: Not At Risk (05/11/2024)  Alcohol Screen: Low Risk  (04/08/2021)  Depression (PHQ2-9): Low Risk  (03/05/2024)  Social Connections: Moderately Isolated (05/11/2024)  Tobacco Use: Low Risk  (05/10/2024)     Readmission Risk Interventions     No data to display

## 2024-05-14 NOTE — Plan of Care (Signed)
  Problem: Education: Goal: Knowledge of General Education information will improve Description: Including pain rating scale, medication(s)/side effects and non-pharmacologic comfort measures Outcome: Progressing   Problem: Health Behavior/Discharge Planning: Goal: Ability to manage health-related needs will improve Outcome: Progressing   Problem: Clinical Measurements: Goal: Ability to maintain clinical measurements within normal limits will improve Outcome: Progressing Goal: Will remain free from infection Outcome: Progressing Goal: Diagnostic test results will improve Outcome: Progressing Goal: Respiratory complications will improve Outcome: Progressing Goal: Cardiovascular complication will be avoided Outcome: Progressing   Problem: Activity: Goal: Risk for activity intolerance will decrease Outcome: Progressing   Problem: Nutrition: Goal: Adequate nutrition will be maintained Outcome: Progressing   Problem: Coping: Goal: Level of anxiety will decrease Outcome: Progressing   Problem: Elimination: Goal: Will not experience complications related to bowel motility Outcome: Progressing Goal: Will not experience complications related to urinary retention Outcome: Progressing   Problem: Pain Managment: Goal: General experience of comfort will improve and/or be controlled Outcome: Progressing   Problem: Safety: Goal: Ability to remain free from injury will improve Outcome: Progressing   Problem: Skin Integrity: Goal: Risk for impaired skin integrity will decrease Outcome: Progressing   Problem: Education: Goal: Ability to demonstrate management of disease process will improve Outcome: Progressing Goal: Ability to verbalize understanding of medication therapies will improve Outcome: Progressing Goal: Individualized Educational Video(s) Outcome: Progressing   Problem: Activity: Goal: Capacity to carry out activities will improve Outcome: Progressing    Problem: Cardiac: Goal: Ability to achieve and maintain adequate cardiopulmonary perfusion will improve Outcome: Progressing   Problem: Education: Goal: Knowledge of disease or condition will improve Outcome: Progressing Goal: Knowledge of the prescribed therapeutic regimen will improve Outcome: Progressing Goal: Individualized Educational Video(s) Outcome: Progressing   Problem: Activity: Goal: Ability to tolerate increased activity will improve Outcome: Progressing Goal: Will verbalize the importance of balancing activity with adequate rest periods Outcome: Progressing   Problem: Respiratory: Goal: Ability to maintain a clear airway will improve Outcome: Progressing Goal: Levels of oxygenation will improve Outcome: Progressing Goal: Ability to maintain adequate ventilation will improve Outcome: Progressing   Problem: Activity: Goal: Ability to tolerate increased activity will improve Outcome: Progressing   Problem: Clinical Measurements: Goal: Ability to maintain a body temperature in the normal range will improve Outcome: Progressing   Problem: Respiratory: Goal: Ability to maintain adequate ventilation will improve Outcome: Progressing Goal: Ability to maintain a clear airway will improve Outcome: Progressing

## 2024-05-14 NOTE — Progress Notes (Signed)
 Rounding Note   Patient Name: Breanna Christian Date of Encounter: 05/14/2024  [facility] Cardiologist: Deatrice Cage, MD   Subjective Patient reports feeling better than her baseline with significant improvement in swelling and breathing. She is eager to be discharged today. Will transition from IV to oral Lasix .   Scheduled Meds:  ALPRAZolam   0.5 mg Oral QHS   azithromycin   250 mg Oral Daily   bisoprolol  2.5 mg Oral BID   budesonide -glycopyrrolate -formoterol   2 puff Inhalation BID   enoxaparin  (LOVENOX ) injection  40 mg Subcutaneous Q24H   ethambutol   400 mg Oral Daily   furosemide   40 mg Oral Daily   mirtazapine   7.5 mg Oral QHS   potassium chloride   20 mEq Oral Daily   rifampin   300 mg Oral Daily   Continuous Infusions:  PRN Meds: acetaminophen **OR** acetaminophen, ALPRAZolam , ipratropium-albuterol , morphine, ondansetron  **OR** ondansetron  (ZOFRAN ) IV   Vital Signs  Vitals:   05/13/24 1933 05/13/24 2308 05/14/24 0500 05/14/24 0718  BP: (!) 105/58 102/69  102/77  Pulse: 88 81  80  Resp: (!) 26 20  16   Temp: 98.3 F (36.8 C) 98.2 F (36.8 C)  98.4 F (36.9 C)  TempSrc: Oral Oral    SpO2: 99% 99%  97%  Weight:   44.5 kg   Height:        Intake/Output Summary (Last 24 hours) at 05/14/2024 0817 Last data filed at 05/13/2024 1300 Gross per 24 hour  Intake 0 ml  Output --  Net 0 ml      05/14/2024    5:00 AM 05/13/2024    5:00 AM 05/12/2024    5:00 AM  Last 3 Weights  Weight (lbs) 98 lb 1.7 oz 100 lb 8.5 oz 100 lb 1.4 oz  Weight (kg) 44.5 kg 45.6 kg 45.4 kg      Telemetry Sinus rhythm rate 80-100 bpm - Personally Reviewed  Physical Exam  GEN: No acute distress.   Neck: No JVD Cardiac: III/VI systolic murmur; RRR, no rubs or gallops.  Respiratory: Diminished breath sounds throughout GI: Soft, nontender, non-distended  MS: No edema; No deformity. Neuro:  Nonfocal  Psych: Normal affect   Labs High Sensitivity Troponin:   Recent Labs   Lab 05/10/24 1433 05/10/24 1934  TROPONINIHS 19* 20*     Chemistry Recent Labs  Lab 05/10/24 1433 05/12/24 0504 05/13/24 0300 05/14/24 0453  NA 136 140 137 141  K 4.5 3.4* 3.7 3.6  CL 90* 84* 83* 85*  CO2 37* 43* 41* 39*  GLUCOSE 87 129* 98 95  BUN 15 15 19  26*  CREATININE 0.30* 0.45 0.50 0.44  CALCIUM 8.5* 8.4* 8.6* 8.8*  MG  --  1.8 2.0  --   PROT 6.2*  --   --   --   ALBUMIN 3.3*  --   --   --   AST 30  --   --   --   ALT 19  --   --   --   ALKPHOS 53  --   --   --   BILITOT 0.8  --   --   --   GFRNONAA >60 >60 >60 >60  ANIONGAP 9 13 13  NOT CALCULATED    Lipids No results for input(s): CHOL, TRIG, HDL, LABVLDL, LDLCALC, CHOLHDL in the last 168 hours.  Hematology Recent Labs  Lab 05/10/24 1433 05/11/24 0427  WBC 4.8 4.2  RBC 4.08 3.80*  HGB 10.6* 9.8*  HCT 34.3* 32.4*  MCV 84.1 85.3  MCH 26.0 25.8*  MCHC 30.9 30.2  RDW 13.3 13.3  PLT 184 184   Thyroid  No results for input(s): TSH, FREET4 in the last 168 hours.  BNP Recent Labs  Lab 05/10/24 1433  BNP 347.4*    DDimer No results for input(s): DDIMER in the last 168 hours.   Radiology  No results found.  Cardiac Studies Echo 01/2024  1. Left ventricular ejection fraction, by estimation, is 60 to 65%. The  left ventricle has normal function. The left ventricle has no regional  wall motion abnormalities. Left ventricular diastolic parameters are  indeterminate.   2. Right ventricular systolic function is normal. The right ventricular  size is normal. There is mildly elevated pulmonary artery systolic  pressure. The estimated right ventricular systolic pressure is 38.2 mmHg.   3. The mitral valve is normal in structure. Moderate to severe mitral  valve regurgitation. Mild to moderate mitral stenosis. The mean mitral  valve gradient is 8.0 mmHg. Severe mitral annular calcification.   4. Tricuspid valve regurgitation is mild to moderate.   5. The aortic valve is normal in structure.  There is severe calcifcation  of the aortic valve. Aortic valve regurgitation is mild. Severe aortic  valve stenosis. Aortic valve area, by VTI measures 0.30 cm. Aortic valve  mean gradient measures 42.0 mmHg.  Aortic valve Vmax measures 4.64 m/s.   6. The inferior vena cava is normal in size with greater than 50%  respiratory variability, suggesting right atrial pressure of 3 mmHg  Patient Profile   83 y.o. female with past medical history of chronic respiratory failure, severe COPD, MAI infection, hypertension, severe aortic stenosis, mitral regurgitation, non-Hodgkin's lymphoma (2021) status post chemotherapy and radiation who is being seen and evaluated for worsening shortness of breath.   Assessment & Plan  Acute on chronic HFpEF Severe aortic stenosis - Initially presenting with shortness of breath and peripheral edema - BNP 347 - Evaluated by structural heart team and deemed not to be a candidate for TAVR given advanced lung disease and high risk to low benefit surgery - Palliative care has been consulted - Appears to be euvolemic on exam with breathing back to baseline - Will transition from IV to oral Lasix  40 mg daily  - Continue bisoprolol 2.5 mg twice daily, unable to titrate further due to hypotension  COPD with acute exacerbation Chronic respiratory failure with hypoxia - Chronic shortness of breath on 2 L supplemental oxygen  at home - Respiratory status improved to baseline - Continued on steroids and bronchodilators per IM  Primary hypertension - Blood pressure stable - Continue bisoprolol as above  Hypokalemia - Recommend supplementation for K greater than 4  For questions or updates, please contact [facility] Please consult [email or web address] for contact info under     Signed, Lesley LITTIE Maffucci, PA-C  05/14/2024, 8:17 AM

## 2024-05-15 ENCOUNTER — Telehealth: Payer: Self-pay

## 2024-05-15 NOTE — Telephone Encounter (Signed)
 Tonya from [facility] [phone] from referral intake that they got referral fro hospital for palliative care they will schedule appt

## 2024-05-17 ENCOUNTER — Other Ambulatory Visit: Payer: Self-pay

## 2024-05-17 ENCOUNTER — Emergency Department

## 2024-05-17 ENCOUNTER — Inpatient Hospital Stay
Admission: EM | Admit: 2024-05-17 | Discharge: 2024-06-21 | DRG: 871 | Disposition: E | Attending: Internal Medicine | Admitting: Internal Medicine

## 2024-05-17 DIAGNOSIS — Z923 Personal history of irradiation: Secondary | ICD-10-CM

## 2024-05-17 DIAGNOSIS — A310 Pulmonary mycobacterial infection: Secondary | ICD-10-CM | POA: Diagnosis not present

## 2024-05-17 DIAGNOSIS — M3500 Sicca syndrome, unspecified: Secondary | ICD-10-CM | POA: Diagnosis not present

## 2024-05-17 DIAGNOSIS — G928 Other toxic encephalopathy: Secondary | ICD-10-CM | POA: Diagnosis not present

## 2024-05-17 DIAGNOSIS — Z66 Do not resuscitate: Secondary | ICD-10-CM | POA: Diagnosis not present

## 2024-05-17 DIAGNOSIS — R6520 Severe sepsis without septic shock: Secondary | ICD-10-CM | POA: Diagnosis not present

## 2024-05-17 DIAGNOSIS — J470 Bronchiectasis with acute lower respiratory infection: Secondary | ICD-10-CM | POA: Diagnosis not present

## 2024-05-17 DIAGNOSIS — F419 Anxiety disorder, unspecified: Secondary | ICD-10-CM | POA: Diagnosis present

## 2024-05-17 DIAGNOSIS — Y95 Nosocomial condition: Secondary | ICD-10-CM | POA: Diagnosis present

## 2024-05-17 DIAGNOSIS — Z515 Encounter for palliative care: Secondary | ICD-10-CM

## 2024-05-17 DIAGNOSIS — I9589 Other hypotension: Secondary | ICD-10-CM | POA: Diagnosis present

## 2024-05-17 DIAGNOSIS — I10 Essential (primary) hypertension: Secondary | ICD-10-CM | POA: Diagnosis not present

## 2024-05-17 DIAGNOSIS — J9819 Other pulmonary collapse: Secondary | ICD-10-CM | POA: Diagnosis not present

## 2024-05-17 DIAGNOSIS — F418 Other specified anxiety disorders: Secondary | ICD-10-CM | POA: Diagnosis present

## 2024-05-17 DIAGNOSIS — I5032 Chronic diastolic (congestive) heart failure: Secondary | ICD-10-CM | POA: Diagnosis not present

## 2024-05-17 DIAGNOSIS — R64 Cachexia: Secondary | ICD-10-CM | POA: Diagnosis not present

## 2024-05-17 DIAGNOSIS — E873 Alkalosis: Secondary | ICD-10-CM | POA: Diagnosis not present

## 2024-05-17 DIAGNOSIS — I21A1 Myocardial infarction type 2: Secondary | ICD-10-CM | POA: Diagnosis present

## 2024-05-17 DIAGNOSIS — J9601 Acute respiratory failure with hypoxia: Secondary | ICD-10-CM | POA: Diagnosis not present

## 2024-05-17 DIAGNOSIS — R0602 Shortness of breath: Secondary | ICD-10-CM | POA: Diagnosis not present

## 2024-05-17 DIAGNOSIS — J440 Chronic obstructive pulmonary disease with acute lower respiratory infection: Secondary | ICD-10-CM | POA: Diagnosis not present

## 2024-05-17 DIAGNOSIS — I110 Hypertensive heart disease with heart failure: Secondary | ICD-10-CM | POA: Diagnosis present

## 2024-05-17 DIAGNOSIS — Z79899 Other long term (current) drug therapy: Secondary | ICD-10-CM

## 2024-05-17 DIAGNOSIS — J42 Unspecified chronic bronchitis: Secondary | ICD-10-CM

## 2024-05-17 DIAGNOSIS — M069 Rheumatoid arthritis, unspecified: Secondary | ICD-10-CM | POA: Diagnosis not present

## 2024-05-17 DIAGNOSIS — A419 Sepsis, unspecified organism: Secondary | ICD-10-CM | POA: Diagnosis not present

## 2024-05-17 DIAGNOSIS — F32A Depression, unspecified: Secondary | ICD-10-CM | POA: Diagnosis not present

## 2024-05-17 DIAGNOSIS — Z681 Body mass index (BMI) 19 or less, adult: Secondary | ICD-10-CM

## 2024-05-17 DIAGNOSIS — J449 Chronic obstructive pulmonary disease, unspecified: Secondary | ICD-10-CM | POA: Diagnosis present

## 2024-05-17 DIAGNOSIS — J90 Pleural effusion, not elsewhere classified: Secondary | ICD-10-CM | POA: Diagnosis not present

## 2024-05-17 DIAGNOSIS — J189 Pneumonia, unspecified organism: Secondary | ICD-10-CM | POA: Diagnosis present

## 2024-05-17 DIAGNOSIS — J9621 Acute and chronic respiratory failure with hypoxia: Secondary | ICD-10-CM | POA: Diagnosis present

## 2024-05-17 DIAGNOSIS — J9622 Acute and chronic respiratory failure with hypercapnia: Secondary | ICD-10-CM | POA: Diagnosis not present

## 2024-05-17 DIAGNOSIS — Z1152 Encounter for screening for COVID-19: Secondary | ICD-10-CM

## 2024-05-17 DIAGNOSIS — I5A Non-ischemic myocardial injury (non-traumatic): Secondary | ICD-10-CM | POA: Diagnosis not present

## 2024-05-17 DIAGNOSIS — Z8572 Personal history of non-Hodgkin lymphomas: Secondary | ICD-10-CM

## 2024-05-17 DIAGNOSIS — Z9221 Personal history of antineoplastic chemotherapy: Secondary | ICD-10-CM

## 2024-05-17 DIAGNOSIS — Z9981 Dependence on supplemental oxygen: Secondary | ICD-10-CM | POA: Diagnosis not present

## 2024-05-17 DIAGNOSIS — R54 Age-related physical debility: Secondary | ICD-10-CM | POA: Diagnosis present

## 2024-05-17 DIAGNOSIS — I350 Nonrheumatic aortic (valve) stenosis: Secondary | ICD-10-CM | POA: Diagnosis present

## 2024-05-17 DIAGNOSIS — R0603 Acute respiratory distress: Secondary | ICD-10-CM | POA: Diagnosis not present

## 2024-05-17 DIAGNOSIS — J479 Bronchiectasis, uncomplicated: Secondary | ICD-10-CM | POA: Diagnosis not present

## 2024-05-17 DIAGNOSIS — I700 Atherosclerosis of aorta: Secondary | ICD-10-CM | POA: Diagnosis not present

## 2024-05-17 DIAGNOSIS — J168 Pneumonia due to other specified infectious organisms: Secondary | ICD-10-CM | POA: Diagnosis not present

## 2024-05-17 DIAGNOSIS — R918 Other nonspecific abnormal finding of lung field: Secondary | ICD-10-CM | POA: Diagnosis not present

## 2024-05-17 LAB — CBC WITH DIFFERENTIAL/PLATELET
Abs Immature Granulocytes: 0.03 10*3/uL (ref 0.00–0.07)
Basophils Absolute: 0.1 10*3/uL (ref 0.0–0.1)
Basophils Relative: 0 %
Eosinophils Absolute: 0 10*3/uL (ref 0.0–0.5)
Eosinophils Relative: 0 %
HCT: 39.7 % (ref 36.0–46.0)
Hemoglobin: 12.3 g/dL (ref 12.0–15.0)
Immature Granulocytes: 0 %
Lymphocytes Relative: 2 %
Lymphs Abs: 0.3 10*3/uL — ABNORMAL LOW (ref 0.7–4.0)
MCH: 25.7 pg — ABNORMAL LOW (ref 26.0–34.0)
MCHC: 31 g/dL (ref 30.0–36.0)
MCV: 82.9 fL (ref 80.0–100.0)
Monocytes Absolute: 0.5 10*3/uL (ref 0.1–1.0)
Monocytes Relative: 3 %
Neutro Abs: 13.5 10*3/uL — ABNORMAL HIGH (ref 1.7–7.7)
Neutrophils Relative %: 95 %
Platelets: 252 10*3/uL (ref 150–400)
RBC: 4.79 MIL/uL (ref 3.87–5.11)
RDW: 13.4 % (ref 11.5–15.5)
WBC: 14.4 10*3/uL — ABNORMAL HIGH (ref 4.0–10.5)
nRBC: 0 % (ref 0.0–0.2)

## 2024-05-17 LAB — RESP PANEL BY RT-PCR (RSV, FLU A&B, COVID)  RVPGX2
Influenza A by PCR: NEGATIVE
Influenza B by PCR: NEGATIVE
Resp Syncytial Virus by PCR: NEGATIVE
SARS Coronavirus 2 by RT PCR: NEGATIVE

## 2024-05-17 LAB — COMPREHENSIVE METABOLIC PANEL WITH GFR
ALT: 25 U/L (ref 0–44)
AST: 35 U/L (ref 15–41)
Albumin: 3.3 g/dL — ABNORMAL LOW (ref 3.5–5.0)
Alkaline Phosphatase: 53 U/L (ref 38–126)
Anion gap: 14 (ref 5–15)
BUN: 26 mg/dL — ABNORMAL HIGH (ref 8–23)
CO2: 36 mmol/L — ABNORMAL HIGH (ref 22–32)
Calcium: 9 mg/dL (ref 8.9–10.3)
Chloride: 82 mmol/L — ABNORMAL LOW (ref 98–111)
Creatinine, Ser: 0.53 mg/dL (ref 0.44–1.00)
GFR, Estimated: >60 mL/min (ref >60)
Glucose, Bld: 178 mg/dL — ABNORMAL HIGH (ref 70–99)
Potassium: 4.2 mmol/L (ref 3.5–5.1)
Sodium: 132 mmol/L — ABNORMAL LOW (ref 135–145)
Total Bilirubin: 0.7 mg/dL (ref 0.0–1.2)
Total Protein: 6.8 g/dL (ref 6.5–8.1)

## 2024-05-17 LAB — LACTIC ACID, PLASMA
Lactic Acid, Venous: 1.9 mmol/L (ref 0.5–1.9)
Lactic Acid, Venous: 1.7 mmol/L (ref 0.5–1.9)

## 2024-05-17 LAB — BRAIN NATRIURETIC PEPTIDE: B Natriuretic Peptide: 335.2 pg/mL — ABNORMAL HIGH (ref 0.0–100.0)

## 2024-05-17 LAB — BLOOD GAS, VENOUS
Acid-Base Excess: 20.5 mmol/L — ABNORMAL HIGH (ref 0.0–2.0)
Bicarbonate: 47.1 mmol/L — ABNORMAL HIGH (ref 20.0–28.0)
Delivery systems: POSITIVE
O2 Saturation: 63.5 %
Patient temperature: 37
pCO2, Ven: 59 mmHg (ref 44–60)
pH, Ven: 7.51 — ABNORMAL HIGH (ref 7.25–7.43)
pO2, Ven: 37 mmHg (ref 32–45)

## 2024-05-17 LAB — TROPONIN I (HIGH SENSITIVITY)
Troponin I (High Sensitivity): 30 ng/L — ABNORMAL HIGH (ref <18)
Troponin I (High Sensitivity): 30 ng/L — ABNORMAL HIGH (ref <18)

## 2024-05-17 MED ORDER — HYDROCORTISONE SOD SUC (PF) 100 MG IJ SOLR
100.0000 mg | Freq: Once | INTRAMUSCULAR | Status: AC
  Administered 2024-05-17: 100 mg via INTRAVENOUS
  Filled 2024-05-17: qty 2

## 2024-05-17 MED ORDER — ACETAMINOPHEN 325 MG PO TABS: 650.0000 mg | ORAL_TABLET | Freq: Four times a day (QID) | ORAL | Status: DC | PRN

## 2024-05-17 MED ORDER — MIDODRINE HCL 5 MG PO TABS
10.0000 mg | ORAL_TABLET | Freq: Three times a day (TID) | ORAL | Status: DC
  Administered 2024-05-17 – 2024-05-20 (×8): 10 mg via ORAL
  Filled 2024-05-17 (×10): qty 2

## 2024-05-17 MED ORDER — ENOXAPARIN SODIUM 30 MG/0.3ML IJ SOSY
30.0000 mg | PREFILLED_SYRINGE | INTRAMUSCULAR | Status: DC
  Administered 2024-05-17 – 2024-05-20 (×3): 30 mg via SUBCUTANEOUS
  Filled 2024-05-17 (×4): qty 0.3

## 2024-05-17 MED ORDER — ONDANSETRON HCL 4 MG/2ML IJ SOLN: 4.0000 mg | Freq: Three times a day (TID) | INTRAMUSCULAR | Status: DC | PRN

## 2024-05-17 MED ORDER — ALPRAZOLAM 0.25 MG PO TABS
0.2500 mg | ORAL_TABLET | Freq: Three times a day (TID) | ORAL | Status: DC | PRN
  Administered 2024-05-18 – 2024-05-20 (×2): 0.25 mg via ORAL
  Filled 2024-05-17 (×2): qty 1

## 2024-05-17 MED ORDER — SODIUM CHLORIDE 0.9 % IV SOLN
2.0000 g | Freq: Once | INTRAVENOUS | Status: AC
  Administered 2024-05-17: 2 g via INTRAVENOUS
  Filled 2024-05-17: qty 12.5

## 2024-05-17 MED ORDER — IPRATROPIUM-ALBUTEROL 0.5-2.5 (3) MG/3ML IN SOLN
3.0000 mL | RESPIRATORY_TRACT | Status: DC
  Administered 2024-05-17 – 2024-05-19 (×5): 3 mL via RESPIRATORY_TRACT
  Filled 2024-05-17 (×6): qty 3

## 2024-05-17 MED ORDER — ALBUTEROL SULFATE (2.5 MG/3ML) 0.083% IN NEBU: 2.5000 mg | INHALATION_SOLUTION | RESPIRATORY_TRACT | Status: DC | PRN

## 2024-05-17 MED ORDER — LIFITEGRAST 5 % OP SOLN: 1.0000 [drp] | Freq: Two times a day (BID) | OPHTHALMIC | Status: DC

## 2024-05-17 MED ORDER — LORAZEPAM 2 MG/ML IJ SOLN
0.5000 mg | Freq: Once | INTRAMUSCULAR | Status: AC
  Administered 2024-05-17: 0.5 mg via INTRAVENOUS
  Filled 2024-05-17: qty 1

## 2024-05-17 MED ORDER — HYDRALAZINE HCL 20 MG/ML IJ SOLN: 5.0000 mg | INTRAMUSCULAR | Status: DC | PRN

## 2024-05-17 MED ORDER — VANCOMYCIN HCL 1250 MG/250ML IV SOLN: 1250.0000 mg | INTRAVENOUS | Status: DC

## 2024-05-17 MED ORDER — ACETAMINOPHEN 10 MG/ML IV SOLN
650.0000 mg | Freq: Once | INTRAVENOUS | Status: AC
  Administered 2024-05-17: 650 mg via INTRAVENOUS
  Filled 2024-05-17: qty 65

## 2024-05-17 MED ORDER — IBUPROFEN 400 MG PO TABS: 400.0000 mg | ORAL_TABLET | Freq: Once | ORAL | Status: DC

## 2024-05-17 MED ORDER — ETHAMBUTOL HCL 400 MG PO TABS
400.0000 mg | ORAL_TABLET | Freq: Every day | ORAL | Status: DC
  Administered 2024-05-18 – 2024-05-19 (×2): 400 mg via ORAL
  Filled 2024-05-17 (×4): qty 1

## 2024-05-17 MED ORDER — BISOPROLOL FUMARATE 5 MG PO TABS
2.5000 mg | ORAL_TABLET | Freq: Two times a day (BID) | ORAL | Status: DC
  Filled 2024-05-17: qty 0.5

## 2024-05-17 MED ORDER — MIRTAZAPINE 15 MG PO TABS
7.5000 mg | ORAL_TABLET | Freq: Every day | ORAL | Status: DC
  Administered 2024-05-18 – 2024-05-20 (×3): 7.5 mg via ORAL
  Filled 2024-05-17 (×4): qty 1

## 2024-05-17 MED ORDER — AZITHROMYCIN 250 MG PO TABS
250.0000 mg | ORAL_TABLET | Freq: Every day | ORAL | Status: DC
  Administered 2024-05-18 – 2024-05-19 (×2): 250 mg via ORAL
  Filled 2024-05-17 (×3): qty 1

## 2024-05-17 MED ORDER — DM-GUAIFENESIN ER 30-600 MG PO TB12: 1.0000 | ORAL_TABLET | Freq: Two times a day (BID) | ORAL | Status: DC | PRN

## 2024-05-17 MED ORDER — METRONIDAZOLE 500 MG/100ML IV SOLN
500.0000 mg | Freq: Once | INTRAVENOUS | Status: AC
  Administered 2024-05-17: 500 mg via INTRAVENOUS
  Filled 2024-05-17: qty 100

## 2024-05-17 MED ORDER — SODIUM CHLORIDE 0.9 % IV SOLN
2.0000 g | Freq: Two times a day (BID) | INTRAVENOUS | Status: DC
  Administered 2024-05-18 – 2024-05-19 (×3): 2 g via INTRAVENOUS
  Filled 2024-05-17 (×4): qty 12.5

## 2024-05-17 MED ORDER — LORAZEPAM 2 MG/ML IJ SOLN: 0.5000 mg | Freq: Four times a day (QID) | INTRAMUSCULAR | Status: DC | PRN

## 2024-05-17 MED ORDER — RIFAMPIN 150 MG PO CAPS
150.0000 mg | ORAL_CAPSULE | Freq: Every day | ORAL | Status: DC
  Filled 2024-05-17 (×2): qty 1

## 2024-05-17 MED ORDER — FUROSEMIDE 40 MG PO TABS: 40.0000 mg | ORAL_TABLET | Freq: Every day | ORAL | Status: DC

## 2024-05-17 MED ORDER — SODIUM CHLORIDE 0.9 % IV BOLUS
250.0000 mL | Freq: Once | INTRAVENOUS | Status: AC
  Administered 2024-05-18: 250 mL via INTRAVENOUS

## 2024-05-17 MED ORDER — VANCOMYCIN HCL IN DEXTROSE 1-5 GM/200ML-% IV SOLN
1000.0000 mg | Freq: Once | INTRAVENOUS | Status: AC
  Administered 2024-05-17: 1000 mg via INTRAVENOUS
  Filled 2024-05-17: qty 200

## 2024-05-17 NOTE — ED Notes (Signed)
 MD Hilma at bedside. He pulled pt off BIPAP at pt family request. Per family, she has an active DNR form at home, but they do not wish to enforce it at this time.

## 2024-05-17 NOTE — Sepsis Progress Note (Signed)
 Elink monitoring for the code sepsis protocol.

## 2024-05-17 NOTE — ED Provider Notes (Signed)
 [facility] Provider Note    Event Date/Time   First MD Initiated Contact with Patient 05/17/24 1918     (approximate)   History   Chief Complaint Respiratory Distress   HPI  Breanna Christian is a 83 y.o. female with past medical history of hypertension, bronchiectasis, chronic hypoxic respiratory failure on 2 L, severe aortic stenosis, Sjogren's disease, and MAI who presents to the ED complaining of respiratory distress.  Per daughter at bedside, patient had rapidly progressing difficulty breathing over the course of the day today, complained of some pain in her lower chest at 1 point but then said this had resolved.  When EMS arrived to her home, patient was found in respiratory distress and was placed on CPAP.  Patient unable to provide any history on arrival to the ED, only shakes her head to express that she is not feeling well.     Physical Exam   Triage Vital Signs: ED Triage Vitals [05/17/24 1916]  Encounter Vitals Group     BP      Girls Systolic BP Percentile      Girls Diastolic BP Percentile      Boys Systolic BP Percentile      Boys Diastolic BP Percentile      Pulse      Resp      Temp      Temp src      SpO2      Weight 98 lb 1.7 oz (44.5 kg)     Height 5' 1 (1.549 m)     Head Circumference      Peak Flow      Pain Score 0     Pain Loc      Pain Education      Exclude from Growth Chart     Most recent vital signs: Vitals:   05/17/24 1920  BP: 108/76  Pulse: (!) 114  Resp: (!) 23  Temp: (!) 101.5 F (38.6 C)  SpO2: 97%    Constitutional: Alert and oriented. Eyes: Conjunctivae are normal. Head: Atraumatic. Nose: No congestion/rhinnorhea. Mouth/Throat: Mucous membranes are moist.  Cardiovascular: Tachycardic, regular rhythm. Grossly normal heart sounds.  2+ radial pulses bilaterally. Respiratory: Tachypneic with increased work of breathing and accessory muscle use. Lungs with crackles and rales  throughout. Gastrointestinal: Soft and nontender. No distention. Musculoskeletal: No lower extremity tenderness, 1+ pitting edema to mid shins bilaterally. Neurologic:  Normal speech and language. No gross focal neurologic deficits are appreciated.    ED Results / Procedures / Treatments   Labs (all labs ordered are listed, but only abnormal results are displayed) Labs Reviewed  CBC WITH DIFFERENTIAL/PLATELET - Abnormal; Notable for the following components:      Result Value   WBC 14.4 (*)    MCH 25.7 (*)    Neutro Abs 13.5 (*)    Lymphs Abs 0.3 (*)    All other components within normal limits  COMPREHENSIVE METABOLIC PANEL WITH GFR - Abnormal; Notable for the following components:   Sodium 132 (*)    Chloride 82 (*)    CO2 36 (*)    Glucose, Bld 178 (*)    BUN 26 (*)    Albumin 3.3 (*)    All other components within normal limits  BRAIN NATRIURETIC PEPTIDE - Abnormal; Notable for the following components:   B Natriuretic Peptide 335.2 (*)    All other components within normal limits  BLOOD GAS, VENOUS - Abnormal; Notable for the  following components:   pH, Ven 7.51 (*)    Bicarbonate 47.1 (*)    Acid-Base Excess 20.5 (*)    All other components within normal limits  TROPONIN I (HIGH SENSITIVITY) - Abnormal; Notable for the following components:   Troponin I (High Sensitivity) 30 (*)    All other components within normal limits  RESP PANEL BY RT-PCR (RSV, FLU A&B, COVID)  RVPGX2  CULTURE, BLOOD (ROUTINE X 2)  CULTURE, BLOOD (ROUTINE X 2)  LACTIC ACID, PLASMA  LACTIC ACID, PLASMA  URINALYSIS, ROUTINE W REFLEX MICROSCOPIC  TROPONIN I (HIGH SENSITIVITY)     EKG  ED ECG REPORT I, Carlin Palin, the attending physician, personally viewed and interpreted this ECG.   Date: 05/17/2024  EKG Time: 21:22  Rate: 96  Rhythm: normal sinus rhythm  Axis: Normal  Intervals:right bundle branch block and left posterior fascicular block  ST&T Change: None  RADIOLOGY Chest  x-ray reviewed and interpreted by me with multifocal infiltrates.  PROCEDURES:  Critical Care performed: Yes, see critical care procedure note(s)  Procedures   MEDICATIONS ORDERED IN ED: Medications  vancomycin (VANCOCIN) IVPB 1000 mg/200 mL premix (1,000 mg Intravenous New Bag/Given 05/17/24 2008)  acetaminophen  (OFIRMEV ) IV 650 mg (650 mg Intravenous New Bag/Given 05/17/24 2009)  ceFEPIme  (MAXIPIME ) 2 g in sodium chloride  0.9 % 100 mL IVPB (0 g Intravenous Stopped 05/17/24 1948)  metroNIDAZOLE (FLAGYL) IVPB 500 mg (500 mg Intravenous New Bag/Given 05/17/24 1948)     IMPRESSION / MDM / ASSESSMENT AND PLAN / ED COURSE  I reviewed the triage vital signs and the nursing notes.                              83 y.o. female with past medical history of hypertension, bronchiectasis, COPD, chronic hypoxic respiratory failure on 2 L, Sjogren's disease, and aortic stenosis who presents to the ED complaining of increasing difficulty breathing over the course of the day today.  Patient's presentation is most consistent with acute presentation with potential threat to life or bodily function.  Differential diagnosis includes, but is not limited to, sepsis, pneumonia, CHF exacerbation, COPD exacerbation, ACS, PE, anemia, electrolyte abnormality, AKI.  Patient ill-appearing and in respiratory distress upon arrival, tachypneic with CPAP in place from EMS, was transitioned to BiPAP.  She is tachycardic with fever of 101.5, chest x-ray concerning for pneumonia and we will treat for sepsis with broad-spectrum antibiotics.  I am also concerned for fluid overload contributing to her acute respiratory failure, will hold off on IV fluid bolus.  VBG does not show significant hypercapnia, labs with leukocytosis but no significant anemia, electrolyte abnormality, or AKI.  Troponin similar to prior baseline with mild elevation in BNP.  On reassessment, patient more awake and alert, conversing appropriately with  daughter.  Daughter does confirm that patient is DNR, case discussed with hospitalist for admission.      FINAL CLINICAL IMPRESSION(S) / ED DIAGNOSES   Final diagnoses:  Sepsis without acute organ dysfunction, due to unspecified organism (HCC)  Pneumonia of both lungs due to infectious organism, unspecified part of lung  Acute respiratory failure with hypoxia (HCC)     Rx / DC Orders   ED Discharge Orders     None        Note:  This document was prepared using Dragon voice recognition software and may include unintentional dictation errors.   Palin Carlin, MD 05/17/24 2256

## 2024-05-17 NOTE — Consult Note (Signed)
 Pharmacy Antibiotic Note  Breanna Christian is a 83 y.o. female admitted on 05/17/2024 with pneumonia.  Pharmacy has been consulted for Vancomycin dosing.  Plan: Vancomycin 1g IV x 1 given as loading dose, followed by: 1250 mg IV Q 48 hrs. Goal AUC 400-550. Expected AUC: 541.7 Expected Cmin: 8.9 SCr used: 0.8(actual 0.53), Vd used: 0.72(TBW<IBW)  Will continue to monitor cultures/renal function and adjust antibiotic plan as appropriate.   Height: 5' 1 (154.9 cm) Weight: 44.5 kg (98 lb 1.7 oz) IBW/kg (Calculated) : 47.8  Temp (24hrs), Avg:101.5 F (38.6 C), Min:101.5 F (38.6 C), Max:101.5 F (38.6 C)  Recent Labs  Lab 05/11/24 0427 05/12/24 0504 05/13/24 0300 05/14/24 0453 05/17/24 1918  WBC 4.2  --   --   --  14.4*  CREATININE  --  0.45 0.50 0.44 0.53  LATICACIDVEN  --   --   --   --  1.9    Estimated Creatinine Clearance: 38.1 mL/min (by C-G formula based on SCr of 0.53 mg/dL).    Allergies  Allergen Reactions   Prednisone  Other (See Comments)    Swells my gums but pt still takes it if needed    Antimicrobials this admission: Vancomycin 6/27 >>  Cefepime  6/27 >>  Flagyl 6/27 x 1  Dose adjustments this admission: N/A  Microbiology results: 6/27 BCx: collected 6/27 MRSA PCR: ordered  Thank you for allowing pharmacy to be a part of this patient's care.  Corry Ihnen A Ereka Brau 05/17/2024 9:14 PM

## 2024-05-17 NOTE — H&P (Addendum)
 History and Physical    Breanna Christian FMW:[id number] DOB: 05/28/1941 DOA: 05/17/2024  Referring MD/NP/PA:   PCP: Fernand Sigrid HERO, MD   Patient coming from:  The patient is coming from home.     Chief Complaint: SOB and respiratory distress, fever  HPI: Breanna Christian is a 83 y.o. female with medical history significant of severe aortic stenosis, dCHF, COPD on 2L oxygen , Sjogren's, bronchiectasis, NHL (s/p chemo and XRT), MAI on chronic antibiotics, depression with anxiety, polyneuropathy, who presents with SOB and respiratory distress, fever  Patient was recently hospitalized from 6/20 - 6/24 due to CHF exacerbation.  Per her daughter at bedside, patient developed severe shortness of breath today, which has been progressively worsening.  Due to severe respiratory distress, patient was put on CPAP by EMS.  Patient has has dry cough.  She has fever and chills.  Her temperature is 101.5 in ED.  She had some right lower chest pain earlier, which has resolved.  Currently no active chest pain.  No nausea, vomiting, diarrhea or abdominal pain.  No symptoms UTI.  Patient continues to have respiratory distress and agitation, BiPAP is started in ED. Her agitation has improved after giving 0.5 mg of Ativan.   Data reviewed independently and ED Course: pt was found to have WBC 14.4, GFR> 60, BNP  335.2, negative PCR for COVID, flu and RSV, lactic acid of 1.9, troponin 30, temperature 101.5, blood pressure 108/76, heart rate 114, RR 30-39, oxygen  saturation 97% on BiPAP.  VBG with pH 7.51, CO2 59, O2 37.  Patient is admitted to PCU as inpatient.  Chest x-ray: 1. No significant interval change of the dense consolidation in the right mid and lower lung zones with adjacent pleural effusion. Patchy airspace opacities are also unchanged in the left lung base. 2. Resolution of the trace left pleural effusion.    EKG: I have personally reviewed.  Sinus rhythm, QTc 481, LAE, right bundle blockade,  early R wave progression   Review of Systems:   General: has fevers, chills, no body weight gain, has fatigue HEENT: no blurry vision, hearing changes or sore throat Respiratory: has dyspnea, coughing, no wheezing CV: had chest pain, no palpitations GI: no nausea, vomiting, abdominal pain, diarrhea, constipation GU: no dysuria, burning on urination, increased urinary frequency, hematuria  Ext: has trace leg edema Neuro: no unilateral weakness, numbness, or tingling, no vision change or hearing loss Skin: no rash, no skin tear. MSK: No muscle spasm, no deformity, no limitation of range of movement in spin Heme: No easy bruising.  Travel history: No recent long distant travel.   Allergy:  Allergies  Allergen Reactions   Prednisone  Other (See Comments)    Swells my gums but pt still takes it if needed    Past Medical History:  Diagnosis Date   Bronchiectasis (HCC) 2011   COPD (chronic obstructive pulmonary disease) (HCC)    HOH (hard of hearing)    Hypertension    Lung nodules    Mycobacterium avium complex colonization    Non-Hodgkin lymphoma (HCC) 2003   Personal history of chemotherapy    Pneumonia    FREQUENT IN PAST    H/O MYCOBACTERIAL   Rheumatoid arthritis (HCC)    Sjogren's syndrome (HCC)    Xerostomia     Past Surgical History:  Procedure Laterality Date   APPENDECTOMY     BRONCHOSCOPY  02/2016   CATARACT EXTRACTION W/PHACO Right 10/26/2016   Procedure: CATARACT EXTRACTION PHACO AND INTRAOCULAR LENS PLACEMENT (  IOC);  Surgeon: Steven Dingeldein, MD;  Location: [facility] ORS;  Service: Ophthalmology;  Laterality: Right;  US  2.17 AP% 26.5 CDE 56.86 Fluid pack lot # [id number]   FLEXIBLE BRONCHOSCOPY Bilateral 03/16/2016   Procedure: FLEXIBLE BRONCHOSCOPY;  Surgeon: Elfreda DELENA Bathe, MD;  Location: [facility] ORS;  Service: Pulmonary;  Laterality: Bilateral;   FLEXIBLE BRONCHOSCOPY N/A 03/03/2017   Procedure: FLEXIBLE BRONCHOSCOPY;  Surgeon: Elfreda DELENA Bathe, MD;  Location: [facility]  ORS;  Service: Pulmonary;  Laterality: N/A;   TUBAL LIGATION      Social History:  reports that she has never smoked. She has never used smokeless tobacco. She reports that she does not drink alcohol and does not use drugs.  Family History:  Family History  Problem Relation Age of Onset   Cancer Sister    Breast cancer Neg Hx      Prior to Admission medications   Medication Sig Start Date End Date Taking? Authorizing Provider  albuterol  (VENTOLIN  HFA) 108 (90 Base) MCG/ACT inhaler Inhale 2 puffs into the lungs every 6 (six) hours as needed for wheezing or shortness of breath. Patient taking differently: Inhale 2 puffs into the lungs 3 (three) times daily. 11/27/23   Liana Fish, NP  ALPRAZolam  (XANAX ) 0.25 MG tablet Take 0.25 mg by mouth 3 (three) times daily as needed. 01/11/24   [provider]  ALPRAZolam  (XANAX ) 0.5 MG tablet Take half tab in am and one at night 01/02/24   Khan, Fozia M, MD  ARTIFICIAL TEAR OP Place 1 drop into both eyes every 6 (six) hours as needed (dry eyes). A product from [place].    [provider]  azithromycin  (ZITHROMAX ) 250 MG tablet Take 1 tablet (250 mg total) by mouth daily. 03/05/24   Khan, Fozia M, MD  bisoprolol  2.5 MG TABS Take 2.5 mg by mouth 2 (two) times daily. 05/14/24   Awanda City, MD  budeson-glycopyrrolate -formoterol  (BREZTRI  AEROSPHERE) 160-9-4.8 MCG/ACT AERO inhaler Inhale 2 puffs into the lungs 2 (two) times daily. 03/05/24   Khan, Fozia M, MD  ethambutol  (MYAMBUTOL ) 400 MG tablet Take 1 tablet (400 mg total) by mouth daily. 03/05/24   Khan, Fozia M, MD  furosemide  (LASIX ) 40 MG tablet Take 1 tablet (40 mg total) by mouth daily. 05/15/24   Awanda City, MD  ibandronate  (BONIVA ) 150 MG tablet TAKE 1 TABLET (150 MG TOTAL) BY MOUTH EVERY 30 (THIRTY) DAYS. TAKE IN THE MORNING WITH A FULL GLASS OF WATER, ON AN EMPTY STOMACH, AND DO NOT TAKE ANYTHING ELSE BY MOUTH OR LIE DOWN FOR THE NEXT 30 MIN. 02/12/24   Khan, Fozia M, MD  ibuprofen  (ADVIL,MOTRIN) 200 MG tablet Take 200 mg by mouth as needed.    [provider]  ipratropium-albuterol  (DUONEB) 0.5-2.5 (3) MG/3ML SOLN Take 3 mLs by nebulization every 2 (two) hours as needed. DX: J44.9 12/12/23   Khan, Fozia M, MD  Lifitegrast 5 % SOLN Apply 1 drop to eye in the morning and at bedtime. 1 Gtt both eyes twice a day    [provider]  mirtazapine  (REMERON ) 7.5 MG tablet Take 7.5 mg by mouth at bedtime. 05/03/24   [provider]  Multiple Vitamin (MULTI-VITAMINS) TABS Take 1 tablet by mouth daily. Reported on 04/25/2016    [provider]  Omega-3 Fatty Acids (OMEGA-3 FISH OIL) 300 MG CAPS Take by mouth daily.    [provider]  rifampin  (RIFADIN ) 150 MG capsule TAKE 1 CAPSULE DAILY 02/20/24   Khan, Fozia M, MD  sodium chloride  HYPERTONIC 3 % nebulizer solution Take by nebulization as needed for other. 11/30/20   Fernand Elfreda LABOR, MD    Physical Exam: Vitals:   05/17/24 1916 05/17/24 1920 05/17/24 2100  BP:  108/76 126/85  Pulse:  (!) 114 (!) 104  Resp:  (!) 23 (!) 35  Temp:  (!) 101.5 F (38.6 C) 98.7 F (37.1 C)  TempSrc:  Axillary Oral  SpO2:  97% 97%  Weight: 44.5 kg    Height: 5' 1 (1.549 m)     General: has acute respiratory distress, has agitation HEENT:       Eyes: PERRL, EOMI, no jaundice       ENT: No discharge from the ears and nose, no pharynx injection, no tonsillar enlargement.        Neck: No JVD, no bruit, no mass felt. Heme: No neck lymph node enlargement. Cardiac: S1/S2, RRR, 4/6 systolic murmur, No gallops or rubs. Respiratory: No wheezing, rhonchi or rubs. GI: Soft, nondistended, nontender, no rebound pain, no organomegaly, BS present. GU: No hematuria Ext: has trace leg edema bilaterally. 1+DP/PT pulse bilaterally. Musculoskeletal: No joint deformities, No joint redness or warmth, no limitation of ROM in spin. Skin: No rashes.  Neuro: Alert, oriented X3, cranial nerves II-XII grossly intact, moves all  extremities normally.  Psych: Patient is not psychotic, no suicidal or hemocidal ideation.  Labs on Admission: I have personally reviewed following labs and imaging studies  CBC: Recent Labs  Lab 05/11/24 0427 05/17/24 1918  WBC 4.2 14.4*  NEUTROABS  --  13.5*  HGB 9.8* 12.3  HCT 32.4* 39.7  MCV 85.3 82.9  PLT 184 252   Basic Metabolic Panel: Recent Labs  Lab 05/12/24 0504 05/13/24 0300 05/14/24 0453 05/17/24 1918  NA 140 137 141 132*  K 3.4* 3.7 3.6 4.2  CL 84* 83* 85* 82*  CO2 43* 41* 39* 36*  GLUCOSE 129* 98 95 178*  BUN 15 19 26* 26*  CREATININE 0.45 0.50 0.44 0.53  CALCIUM 8.4* 8.6* 8.8* 9.0  MG 1.8 2.0  --   --    GFR: Estimated Creatinine Clearance: 38.1 mL/min (by C-G formula based on SCr of 0.53 mg/dL). Liver Function Tests: Recent Labs  Lab 05/17/24 1918  AST 35  ALT 25  ALKPHOS 53  BILITOT 0.7  PROT 6.8  ALBUMIN 3.3*   No results for input(s): LIPASE, AMYLASE in the last 168 hours. No results for input(s): AMMONIA in the last 168 hours. Coagulation Profile: No results for input(s): INR, PROTIME in the last 168 hours. Cardiac Enzymes: No results for input(s): CKTOTAL, CKMB, CKMBINDEX, TROPONINI in the last 168 hours. BNP (last 3 results) No results for input(s): PROBNP in the last 8760 hours. HbA1C: No results for input(s): HGBA1C in the last 72 hours. CBG: No results for input(s): GLUCAP in the last 168 hours. Lipid Profile: No results for input(s): CHOL, HDL, LDLCALC, TRIG, CHOLHDL, LDLDIRECT in the last 72 hours. Thyroid  Function Tests: No results for input(s): TSH, T4TOTAL, FREET4, T3FREE, THYROIDAB in the last 72 hours. Anemia Panel: No results for input(s): VITAMINB12, FOLATE, FERRITIN, TIBC, IRON, RETICCTPCT in the last 72 hours. Urine analysis:    Component Value Date/Time   APPEARANCEUR Clear 12/12/2023 1523   GLUCOSEU Negative 12/12/2023 1523   BILIRUBINUR Negative  12/12/2023 1523   PROTEINUR Negative 12/12/2023 1523   UROBILINOGEN 0.2 11/27/2023 1511   NITRITE Negative 12/12/2023 1523   LEUKOCYTESUR 1+ (A) 12/12/2023 1523   Sepsis Labs: @LABRCNTIP (procalcitonin:4,lacticidven:4) ) Recent  Results (from the past 240 hours)  Resp panel by RT-PCR (RSV, Flu A&B, Covid) Anterior Nasal Swab     Status: None   Collection Time: 05/17/24  7:18 PM   Specimen: Anterior Nasal Swab  Result Value Ref Range Status   SARS Coronavirus 2 by RT PCR NEGATIVE NEGATIVE Final    Comment: (NOTE) SARS-CoV-2 target nucleic acids are NOT DETECTED.  The SARS-CoV-2 RNA is generally detectable in upper respiratory specimens during the acute phase of infection. The lowest concentration of SARS-CoV-2 viral copies this assay can detect is 138 copies/mL. A negative result does not preclude SARS-Cov-2 infection and should not be used as the sole basis for treatment or other patient management decisions. A negative result may occur with  improper specimen collection/handling, submission of specimen other than nasopharyngeal swab, presence of viral mutation(s) within the areas targeted by this assay, and inadequate number of viral copies(<138 copies/mL). A negative result must be combined with clinical observations, patient history, and epidemiological information. The expected result is Negative.  Fact Sheet for Patients:  BloggerCourse.com  Fact Sheet for Healthcare Providers:  SeriousBroker.it  This test is no t yet approved or cleared by the [place]  FDA and  has been authorized for detection and/or diagnosis of SARS-CoV-2 by FDA under an Emergency Use Authorization (EUA). This EUA will remain  in effect (meaning this test can be used) for the duration of the COVID-19 declaration under Section 564(b)(1) of the Act, 21 U.S.C.section 360bbb-3(b)(1), unless the authorization is terminated  or revoked sooner.        Influenza A by PCR NEGATIVE NEGATIVE Final   Influenza B by PCR NEGATIVE NEGATIVE Final    Comment: (NOTE) The Xpert Xpress SARS-CoV-2/FLU/RSV plus assay is intended as an aid in the diagnosis of influenza from Nasopharyngeal swab specimens and should not be used as a sole basis for treatment. Nasal washings and aspirates are unacceptable for Xpert Xpress SARS-CoV-2/FLU/RSV testing.  Fact Sheet for Patients: BloggerCourse.com  Fact Sheet for Healthcare Providers: SeriousBroker.it  This test is not yet approved or cleared by the [place]  FDA and has been authorized for detection and/or diagnosis of SARS-CoV-2 by FDA under an Emergency Use Authorization (EUA). This EUA will remain in effect (meaning this test can be used) for the duration of the COVID-19 declaration under Section 564(b)(1) of the Act, 21 U.S.C. section 360bbb-3(b)(1), unless the authorization is terminated or revoked.     Resp Syncytial Virus by PCR NEGATIVE NEGATIVE Final    Comment: (NOTE) Fact Sheet for Patients: BloggerCourse.com  Fact Sheet for Healthcare Providers: SeriousBroker.it  This test is not yet approved or cleared by the [place]  FDA and has been authorized for detection and/or diagnosis of SARS-CoV-2 by FDA under an Emergency Use Authorization (EUA). This EUA will remain in effect (meaning this test can be used) for the duration of the COVID-19 declaration under Section 564(b)(1) of the Act, 21 U.S.C. section 360bbb-3(b)(1), unless the authorization is terminated or revoked.  Performed at [facility], [address], [place], [place] [address]      Radiological Exams on Admission:   Assessment/Plan Principal Problem:   HCAP (healthcare-associated pneumonia) Active Problems:   Sepsis (HCC)   Acute on chronic respiratory failure with hypoxia (HCC)   MAI (mycobacterium  avium-intracellulare) infection (HCC)   Bronchiectasis (HCC)   COPD (chronic obstructive pulmonary disease) (HCC)   Essential hypertension   Chronic diastolic CHF (congestive heart failure) (HCC)   Myocardial injury   Sjogren syndrome (  HCC)   Depression with anxiety   Assessment and Plan:   Acute on chronic respiratory failure with hypoxia and sepsis due to HCAP (healthcare-associated pneumonia): Patient has severe respiratory distress, requiring BiPAP, meets criteria for sepsis with WBC 14.4, heart rate 114, RR 23, fever 101.5.  Lactic acid is normal 1.9.  Due to recent CHF exacerbation and elevated BNP 335, will not give IV fluid.  - Will admit to PCU as inpt - Try to wean off of BiPAP - IV Vancomycin and cefepime  (patient received 1 dose of Flagyl in ED) - Incentive spirometry - Mucinex for cough  - Bronchodilators - Urine legionella and S. pneumococcal antigen - Follow up blood culture x2, sputum culture  Addendum: per RN report, after giving Ativan, patient's blood pressure dropped to 88/51 (62).  -will give 100 mg of Solu-Cortef - Start midodrine 10 mg 3 times daily - Hold Lasix  and bisoprolol     MAI (mycobacterium avium-intracellulare) infection (HCC) -Continue home azithromycin , Zapatel, rifampin   Bronchiectasis and COPD (chronic obstructive pulmonary disease) (HCC): no wheezing. -Bronchodilators and as needed Mucinex as above  Essential hypertension -IV hydralazine as needed - hole Bisoprolol  due to soft Bp  Chronic diastolic CHF (congestive heart failure) (HCC): 2D echo on 02/14/2024 showed EF 60 to 65%.  Patient has trace leg edema, mildly elevated BNP 335, no JVD, does not seem to have CHF exacerbation. -hold lasix  due to soft Bp  Myocardial injury: Troponin 30.  Currently no active chest pain.  Patient had some right lower chest pain earlier which has resolved, possibly due to pneumonia.  Patient has a demand ischemia. -Trend troponin  Sjogren syndrome  (HCC): -Continue home eyedrops  Depression with anxiety - Continue home medications: Remeron  and as needed Xanax  - Started IV Ativan as needed for agitation while patient is on BiPAP        DVT ppx: SQ Lovenox   Code Status: DNR per her daughter  Family Communication:  Yes, patient's daughter   at bed side.      Disposition Plan:  Anticipate discharge back to previous environment  Consults called: None  Admission status and Level of care: Progressive:  as inpt        Dispo: The patient is from: Home              Anticipated d/c is to: Home              Anticipated d/c date is: 2 days              Patient currently is not medically stable to d/c.    Severity of Illness:  The appropriate patient status for this patient is INPATIENT. Inpatient status is judged to be reasonable and necessary in order to provide the required intensity of service to ensure the patient's safety. The patient's presenting symptoms, physical exam findings, and initial radiographic and laboratory data in the context of their chronic comorbidities is felt to place them at high risk for further clinical deterioration. Furthermore, it is not anticipated that the patient will be medically stable for discharge from the hospital within 2 midnights of admission.   * I certify that at the point of admission it is my clinical judgment that the patient will require inpatient hospital care spanning beyond 2 midnights from the point of admission due to high intensity of service, high risk for further deterioration and high frequency of surveillance required.*       Date of Service 05/17/2024    Breanna Christian  Analea Muller [facility]   If 7PM-7AM, please contact night-coverage [email or web address] 05/17/2024, 9:42 PM

## 2024-05-17 NOTE — ED Notes (Signed)
 Pt is very restless. Agitated. Provider made aware and verbally advised to order ativan.

## 2024-05-17 NOTE — Consult Note (Signed)
 CODE SEPSIS - PHARMACY COMMUNICATION  **Broad Spectrum Antibiotics should be administered within 1 hour of Sepsis diagnosis**  Time Code Sepsis Called/Page Received: 1925  Antibiotics Ordered: vancomycin, cefepime , flagyl  Time of 1st antibiotic administration: 1938  Additional action taken by pharmacy: n/a  If necessary, Name of Provider/Nurse Contacted: n/a    Breanna Christian ,PharmD Clinical Pharmacist  05/17/2024  7:33 PM

## 2024-05-17 NOTE — ED Triage Notes (Signed)
 BIB [facility] from home for resp distress.  on CPAP. recently DC a few days ago with CHF. Per daughter not coherent today. 98% on CPAP. 92% on 2 liters 108/103 103.3 ax temp 112 HR  20 G L wrist  2gram of mag  HX COPD

## 2024-05-18 DIAGNOSIS — J9601 Acute respiratory failure with hypoxia: Secondary | ICD-10-CM | POA: Diagnosis not present

## 2024-05-18 DIAGNOSIS — A310 Pulmonary mycobacterial infection: Secondary | ICD-10-CM | POA: Diagnosis not present

## 2024-05-18 DIAGNOSIS — A419 Sepsis, unspecified organism: Secondary | ICD-10-CM | POA: Diagnosis not present

## 2024-05-18 DIAGNOSIS — J189 Pneumonia, unspecified organism: Secondary | ICD-10-CM | POA: Diagnosis not present

## 2024-05-18 LAB — BASIC METABOLIC PANEL WITH GFR
Anion gap: 14 (ref 5–15)
BUN: 32 mg/dL — ABNORMAL HIGH (ref 8–23)
CO2: 34 mmol/L — ABNORMAL HIGH (ref 22–32)
Calcium: 8.3 mg/dL — ABNORMAL LOW (ref 8.9–10.3)
Chloride: 85 mmol/L — ABNORMAL LOW (ref 98–111)
Creatinine, Ser: 0.69 mg/dL (ref 0.44–1.00)
GFR, Estimated: >60 mL/min (ref >60)
Glucose, Bld: 128 mg/dL — ABNORMAL HIGH (ref 70–99)
Potassium: 4.2 mmol/L (ref 3.5–5.1)
Sodium: 133 mmol/L — ABNORMAL LOW (ref 135–145)

## 2024-05-18 LAB — CBC
HCT: 34.3 % — ABNORMAL LOW (ref 36.0–46.0)
Hemoglobin: 10.7 g/dL — ABNORMAL LOW (ref 12.0–15.0)
MCH: 26.2 pg (ref 26.0–34.0)
MCHC: 31.2 g/dL (ref 30.0–36.0)
MCV: 84.1 fL (ref 80.0–100.0)
Platelets: 225 10*3/uL (ref 150–400)
RBC: 4.08 MIL/uL (ref 3.87–5.11)
RDW: 13.5 % (ref 11.5–15.5)
WBC: 25.8 10*3/uL — ABNORMAL HIGH (ref 4.0–10.5)
nRBC: 0 % (ref 0.0–0.2)

## 2024-05-18 LAB — MRSA NEXT GEN BY PCR, NASAL: MRSA by PCR Next Gen: NOT DETECTED

## 2024-05-18 NOTE — Progress Notes (Addendum)
 [facility]  - [place] [facility]   PATIENT NAME: Breanna Christian    MR#:  [id number]  DATE OF BIRTH:  [birth year]  SUBJECTIVE:  no family at bedside during my evaluation. Patient came in with increasing shortness of breath high-grade fever at home unable to produce any phlegm and respiratory distress. Currently on 3 L nasal cannula has purse lip breathing. Tells me she is feeling a bit better. Blood pressure has been soft patient is asymptomatic    VITALS:  Blood pressure (!) 90/54, pulse 99, temperature 98.4 F (36.9 C), temperature source Axillary, resp. rate 20, height 5' 1 (1.549 m), weight 44.5 kg, SpO2 90%.  PHYSICAL EXAMINATION:   GENERAL:  83 y.o.-year-old patient with no acute distress. Thin cachectic frail LUNGS distant breath sounds bilaterally, no wheezing CARDIOVASCULAR: S1, S2 normal. No murmur   ABDOMEN: Soft, nontender, nondistended.  EXTREMITIES: No  edema b/l.    NEUROLOGIC: nonfocal  patient is alert and awake   LABORATORY PANEL:  CBC Recent Labs  Lab 05/18/24 0339  WBC 25.8*  HGB 10.7*  HCT 34.3*  PLT 225    Chemistries  Recent Labs  Lab 05/13/24 0300 05/14/24 0453 05/17/24 1918 05/18/24 0339  NA 137   < > 132* 133*  K 3.7   < > 4.2 4.2  CL 83*   < > 82* 85*  CO2 41*   < > 36* 34*  GLUCOSE 98   < > 178* 128*  BUN 19   < > 26* 32*  CREATININE 0.50   < > 0.53 0.69  CALCIUM 8.6*   < > 9.0 8.3*  MG 2.0  --   --   --   AST  --   --  35  --   ALT  --   --  25  --   ALKPHOS  --   --  53  --   BILITOT  --   --  0.7  --    < > = values in this interval not displayed.   Cardiac Enzymes No results for input(s): TROPONINI in the last 168 hours. RADIOLOGY:  DG Chest Portable 1 View Result Date: 05/17/2024 CLINICAL DATA:  SOB EXAM: PORTABLE CHEST - 1 VIEW COMPARISON:  May 10, 2024 FINDINGS: Similar appearance of the dense airspace consolidation in the right lung base with adjacent pleural effusion. Patchy airspace opacities  also persist in the left lung base. The trace left effusion has resolved in the interim. No pneumothorax. No cardiomegaly. Aortic atherosclerosis. No acute fracture or destructive lesions. Multilevel thoracic osteophytosis. IMPRESSION: 1. No significant interval change of the dense consolidation in the right mid and lower lung zones with adjacent pleural effusion. Patchy airspace opacities are also unchanged in the left lung base. 2. Resolution of the trace left pleural effusion. Electronically Signed   By: Rogelia Myers M.D.   On: 05/17/2024 19:39    Assessment and Plan [name] is a 83 y.o. female with medical history significant of severe aortic stenosis, dCHF, COPD on 2L oxygen , Sjogren's, bronchiectasis, NHL (s/p chemo and XRT), MAI on chronic antibiotics, depression with anxiety, polyneuropathy, who presents with SOB and respiratory distress, fever   Patient was recently hospitalized from 6/20 - 6/24 due to CHF exacerbation.  Per her daughter at bedside, patient developed severe shortness of breath today, which has been progressively worsening.    Her temperature is 101.5 in ED     CXR1. No significant interval change of the  dense consolidation in the right mid and lower lung zones with adjacent pleural effusion. Patchy airspace opacities are also unchanged in the left lung base. 2. Resolution of the trace left pleural effusion.  Sepsis Acute on chronic respiratory failure with hypoxia and sepsis due to Right LL Penumonia : Patient has severe respiratory distress, requiring BiPAP, meets criteria for sepsis with WBC 14.4, heart rate 114, RR 23, fever 101.5.  Lactic acid is normal 1.9.  Due to recent CHF exacerbation and elevated BNP 335, will not give IV fluid.  - Incentive spirometry - Mucinex for cough  - Bronchodilators - Urine legionella and S. pneumococcal antigen -  blood culture x2 negative, unable to give sputum smaple --IV cefepime   - Start midodrine 10 mg 3 times  daily - Hold Lasix  and bisoprolol  --pt follows Dr Fernand President   MAI (mycobacterium avium-intracellulare) infection (HCC) -Continue home azithromycin , Zapatel, rifampin    Bronchiectasis and COPD (chronic obstructive pulmonary disease) (HCC): no wheezing. -Bronchodilators and as needed Mucinex as above   Essential hypertension -IV hydralazine as needed - hole Bisoprolol  due to soft Bp   Chronic diastolic CHF (congestive heart failure) (HCC):  Severe AS--not candidate for TAVR 2D echo on 02/14/2024 showed EF 60 to 65%.  Patient has trace leg edema, mildly elevated BNP 335, no JVD, does not seem to have CHF exacerbation. -hold lasix  due to soft Bp   Myocardial injury: Troponin 30.  Currently no active chest pain.  Patient had some right lower chest pain earlier which has resolved, possibly due to pneumonia.  Patient has a demand ischemia. -Trend troponin   Sjogren syndrome (HCC): -Continue home eyedrops   Depression with anxiety - Continue home medications: Remeron  and as needed Xanax           patient was seen by palliative care last admission which was four days ago. Daughters agreed with palliative to follow as outpatient. Will resume at discharge  Procedures: Family communication :left VM for dter Eleni Consults :none CODE STATUS: DNR DVT Prophylaxis :enoxaparin  Level of care: Progressive Status is: Inpatient Remains inpatient appropriate because: acute on chronic respiratory failure with pneumonia    TOTAL TIME TAKING CARE OF THIS PATIENT: 45 minutes.  >50% time spent on counselling and coordination of care  Note: This dictation was prepared with Dragon dictation along with smaller phrase technology. Any transcriptional errors that result from this process are unintentional.  Leita Blanch M.D    [facility]   CC: Primary care physician; Fernand Sigrid HERO, MD

## 2024-05-18 NOTE — ED Notes (Signed)
 Pt brief and bedding changed at this time due to being soiled. Purewick replaced and new brief placed at this time.

## 2024-05-18 NOTE — ED Notes (Signed)
 Attending notified of low BP, OK to give 0800 scheduled med earlier per attending.

## 2024-05-19 ENCOUNTER — Encounter: Payer: Self-pay | Admitting: Internal Medicine

## 2024-05-19 ENCOUNTER — Encounter: Payer: Self-pay | Admitting: Nurse Practitioner

## 2024-05-19 DIAGNOSIS — J189 Pneumonia, unspecified organism: Secondary | ICD-10-CM | POA: Diagnosis not present

## 2024-05-19 LAB — CBC
HCT: 33.7 % — ABNORMAL LOW (ref 36.0–46.0)
Hemoglobin: 10.6 g/dL — ABNORMAL LOW (ref 12.0–15.0)
MCH: 26.3 pg (ref 26.0–34.0)
MCHC: 31.5 g/dL (ref 30.0–36.0)
MCV: 83.6 fL (ref 80.0–100.0)
Platelets: 225 10*3/uL (ref 150–400)
RBC: 4.03 MIL/uL (ref 3.87–5.11)
RDW: 13.7 % (ref 11.5–15.5)
WBC: 18.2 10*3/uL — ABNORMAL HIGH (ref 4.0–10.5)
nRBC: 0 % (ref 0.0–0.2)

## 2024-05-19 LAB — RESPIRATORY PANEL BY PCR

## 2024-05-19 MED ORDER — BISOPROLOL FUMARATE 5 MG PO TABS
2.5000 mg | ORAL_TABLET | Freq: Two times a day (BID) | ORAL | Status: DC
  Administered 2024-05-19 (×2): 2.5 mg via ORAL
  Filled 2024-05-19 (×5): qty 0.5

## 2024-05-19 MED ORDER — BUDESON-GLYCOPYRROL-FORMOTEROL 160-9-4.8 MCG/ACT IN AERO
2.0000 | INHALATION_SPRAY | Freq: Two times a day (BID) | RESPIRATORY_TRACT | Status: DC
  Administered 2024-05-19 – 2024-05-20 (×4): 2 via RESPIRATORY_TRACT
  Filled 2024-05-19: qty 5.9

## 2024-05-19 MED ORDER — IPRATROPIUM-ALBUTEROL 0.5-2.5 (3) MG/3ML IN SOLN
3.0000 mL | Freq: Four times a day (QID) | RESPIRATORY_TRACT | Status: DC
  Administered 2024-05-19 – 2024-05-20 (×3): 3 mL via RESPIRATORY_TRACT
  Filled 2024-05-19 (×4): qty 3

## 2024-05-19 MED ORDER — ADULT MULTIVITAMIN W/MINERALS CH
1.0000 | ORAL_TABLET | Freq: Every day | ORAL | Status: DC
  Administered 2024-05-19: 1 via ORAL
  Filled 2024-05-19 (×2): qty 1

## 2024-05-19 MED ORDER — SODIUM CHLORIDE 0.9 % IV SOLN
2.0000 g | INTRAVENOUS | Status: DC
  Administered 2024-05-19: 2 g via INTRAVENOUS
  Filled 2024-05-19: qty 20

## 2024-05-19 MED ORDER — ORAL CARE MOUTH RINSE: 15.0000 mL | OROMUCOSAL | Status: DC | PRN

## 2024-05-19 MED ORDER — IPRATROPIUM-ALBUTEROL 0.5-2.5 (3) MG/3ML IN SOLN
RESPIRATORY_TRACT | Status: AC
  Administered 2024-05-19: 3 mL via RESPIRATORY_TRACT
  Filled 2024-05-19: qty 3

## 2024-05-19 MED ORDER — OMEGA-3-ACID ETHYL ESTERS 1 G PO CAPS
1.0000 | ORAL_CAPSULE | Freq: Every day | ORAL | Status: DC
  Administered 2024-05-19: 1 g via ORAL
  Filled 2024-05-19 (×2): qty 1

## 2024-05-19 NOTE — Progress Notes (Signed)
 [facility]  - Breanna Christian at [facility]   PATIENT NAME: Breanna Christian    MR#:  [id number]  DATE OF BIRTH:  07-19-1941  SUBJECTIVE:   Spoke with daughter and son at bedside. Patient was a bit sleepy this morning although did wake up and eat little bit food. Appeared fatigued/tired. Took his breathing treatment earlier.   VITALS:  Blood pressure 105/70, pulse 98, temperature 98.6 F (37 C), resp. rate 17, height 5' 1 (1.549 m), weight 45.7 kg, SpO2 98%.  PHYSICAL EXAMINATION:   GENERAL:  83 y.o.-year-old patient with no acute distress. Thin cachectic frail LUNGS distant breath sounds bilaterally, no wheezing CARDIOVASCULAR: S1, S2 normal. No murmur   ABDOMEN: Soft, nontender, nondistended.  EXTREMITIES: No  edema b/l.    NEUROLOGIC: nonfocal  patient is alert and awake   LABORATORY PANEL:  CBC Recent Labs  Lab 05/19/24 0417  WBC 18.2*  HGB 10.6*  HCT 33.7*  PLT 225    Chemistries  Recent Labs  Lab 05/13/24 0300 05/14/24 0453 05/17/24 1918 05/18/24 0339  NA 137   < > 132* 133*  K 3.7   < > 4.2 4.2  CL 83*   < > 82* 85*  CO2 41*   < > 36* 34*  GLUCOSE 98   < > 178* 128*  BUN 19   < > 26* 32*  CREATININE 0.50   < > 0.53 0.69  CALCIUM 8.6*   < > 9.0 8.3*  MG 2.0  --   --   --   AST  --   --  35  --   ALT  --   --  25  --   ALKPHOS  --   --  53  --   BILITOT  --   --  0.7  --    < > = values in this interval not displayed.   Cardiac Enzymes No results for input(s): TROPONINI in the last 168 hours. RADIOLOGY:  DG Chest Portable 1 View Result Date: 05/17/2024 CLINICAL DATA:  SOB EXAM: PORTABLE CHEST - 1 VIEW COMPARISON:  May 10, 2024 FINDINGS: Similar appearance of the dense airspace consolidation in the right lung base with adjacent pleural effusion. Patchy airspace opacities also persist in the left lung base. The trace left effusion has resolved in the interim. No pneumothorax. No cardiomegaly. Aortic atherosclerosis. No acute fracture or  destructive lesions. Multilevel thoracic osteophytosis. IMPRESSION: 1. No significant interval change of the dense consolidation in the right mid and lower lung zones with adjacent pleural effusion. Patchy airspace opacities are also unchanged in the left lung base. 2. Resolution of the trace left pleural effusion. Electronically Signed   By: Rogelia Myers M.D.   On: 05/17/2024 19:39    Assessment and Plan [name] is a 83 y.o. female with medical history significant of severe aortic stenosis, dCHF, COPD on 2L oxygen , Sjogren's, bronchiectasis, NHL (s/p chemo and XRT), MAI on chronic antibiotics, depression with anxiety, polyneuropathy, who presents with SOB and respiratory distress, fever   Patient was recently hospitalized from 6/20 - 6/24 due to CHF exacerbation.  Per her daughter at bedside, patient developed severe shortness of breath today, which has been progressively worsening.    Her temperature is 101.5 in ED     CXR1. No significant interval change of the dense consolidation in the right mid and lower lung zones with adjacent pleural effusion. Patchy airspace opacities are also unchanged in the left lung base. 2. Resolution  of the trace left pleural effusion.  Sepsis Acute on chronic respiratory failure with hypoxia and sepsis due to Right LL Penumonia : Patient has severe respiratory distress, requiring BiPAP, meets criteria for sepsis with WBC 14.4, heart rate 114, RR 23, fever 101.5.  Lactic acid is normal 1.9.  Due to recent CHF exacerbation and elevated BNP 335, will not give IV fluid.  - Incentive spirometry - Mucinex for cough  - Bronchodilators -  blood culture x2 negative, unable to give sputum smaple --IV cefepime   - Start midodrine 10 mg 3 times daily - Hold Lasix   --pt follows Dr Fernand Carder --Consult Pulm--Dr Aleskerov--family ok--viral panel. Change to Rocephin since cefepime  may be making pt drowsy -- chest PT   MAI (mycobacterium avium-intracellulare)  infection (HCC) -Continue home azithromycin , ETM, rifampin    Bronchiectasis and COPD (chronic obstructive pulmonary disease) (HCC): no wheezing. -Bronchodilators and as needed Mucinex as above   Essential hypertension -IV hydralazine as needed - hole Bisoprolol  due to soft Bp   Chronic diastolic CHF (congestive heart failure) (HCC):  Severe AS--not candidate for TAVR 2D echo on 02/14/2024 showed EF 60 to 65%.  Patient has trace leg edema, mildly elevated BNP 335, no JVD, does not seem to have CHF exacerbation. -hold lasix  due to soft Bp   Myocardial injury: Troponin 30.  Currently no active chest pain.  Patient had some right lower chest pain earlier which has resolved, possibly due to pneumonia.  Patient has a demand ischemia. -Trend troponin   Sjogren syndrome (HCC): -Continue home eyedrops   Depression with anxiety - Continue home medications: Remeron  and as needed Xanax           patient was seen by palliative care last admission which was four days ago. Daughters agreed with palliative to follow as outpatient. Will resume at discharge  Procedures:none Family communication :spoke with son and dter Consults :none CODE STATUS: DNR DVT Prophylaxis :enoxaparin  Level of care: Progressive Status is: Inpatient Remains inpatient appropriate because: acute on chronic respiratory failure with pneumonia    TOTAL TIME TAKING CARE OF THIS PATIENT: 35 minutes.  >50% time spent on counselling and coordination of care  Note: This dictation was prepared with Dragon dictation along with smaller phrase technology. Any transcriptional errors that result from this process are unintentional.  Leita Blanch M.D    [facility]   CC: Primary care physician; Fernand Sigrid HERO, MD

## 2024-05-19 NOTE — Evaluation (Signed)
 Physical Therapy Evaluation Patient Details Name: Breanna Christian MRN: [id number] DOB: Nov 13, 1941 Today's Date: 05/19/2024  History of Present Illness  Pt admitted for HCAP. Pt with PMH of CHF< non hodgkins lymphoma, Sjogrens Dx, and COPD- on 2L of O2.  Clinical Impression  Pt is a pleasant 83 year old female who was admitted for HCAP. Recent admission last week. Pt performs bed mobility with supervision/min assist and deferred further transfers/ambulation this date secondary to fatigue. Pt demonstrates deficits with strength/mobility/endurance. Pt currently on 3L of O2 with sats at 99%. Son requesting for rollator use at home. Is active with HHPT and is hoping to resume services once discharged. Would benefit from skilled PT to address above deficits and promote optimal return to PLOF. Pt will continue to receive skilled PT services while admitted and will defer to TOC/care team for updates regarding disposition planning.         If plan is discharge home, recommend the following: A little help with walking and/or transfers;A little help with bathing/dressing/bathroom;Assistance with cooking/housework;Assistance with feeding;Assist for transportation;Help with stairs or ramp for entrance   Can travel by private vehicle        Equipment Recommendations Rollator (4 wheels);BSC/3in1  Recommendations for Other Services       Functional Status Assessment Patient has had a recent decline in their functional status and demonstrates the ability to make significant improvements in function in a reasonable and predictable amount of time.     Precautions / Restrictions Precautions Precautions: Fall Recall of Precautions/Restrictions: Intact Restrictions Weight Bearing Restrictions Per Provider Order: No      Mobility  Bed Mobility Overal bed mobility: Needs Assistance Bed Mobility: Supine to Sit, Sit to Supine     Supine to sit: Supervision Sit to supine: Min assist   General bed  mobility comments: Pt able to perform bed mobility with supervision, able to maintain seated posture for majority of session prior to fatigue and assist for returning to bed. Pt able to self scoot up towards HOB- increased WOB noted throughout. O2 sats WNL on 3L of O2    Transfers                   General transfer comment: deferred secondary to fatigue/weakness. Son reports she has been ambulatory to bathroom this date.    Ambulation/Gait                  Stairs            Wheelchair Mobility     Tilt Bed    Modified Rankin (Stroke Patients Only)       Balance Overall balance assessment: Modified Independent                                           Pertinent Vitals/Pain Pain Assessment Pain Assessment: No/denies pain    Home Living Family/patient expects to be discharged to:: Private residence Living Arrangements: Alone Available Help at Discharge: Family;Available PRN/intermittently (son/wife live [place] [facility] [place]. Also has daughter who lives close by) Type of Home: House Home Access: Stairs to enter Entrance Stairs-Rails: Left Entrance Stairs-Number of Steps: 2   Home Layout: One level Home Equipment: Shower seat - built in;Wheelchair - manual Additional Comments: Per discussion with son, pt will likely dc to private residence (described above) or to daughters home (is able to stay downstairs) with family  assistance. Family also agreeable to PCA services    Prior Function Prior Level of Function : Independent/Modified Independent             Mobility Comments: indep, no falls. ADLs Comments: sits for showering, is able to self dress.     Extremity/Trunk Assessment   Upper Extremity Assessment Upper Extremity Assessment: Generalized weakness    Lower Extremity Assessment Lower Extremity Assessment: Generalized weakness (slight edema noted in B LE)       Communication   Communication Communication:  Impaired Factors Affecting Communication: Non - English speaking, interpreter not available;Hearing impaired    Cognition Arousal: Lethargic Behavior During Therapy: Flat affect   PT - Cognitive impairments: No apparent impairments                       PT - Cognition Comments: Pt pleasant and agreeable. Son at bedside Following commands: Intact       Cueing Cueing Techniques: Verbal cues     General Comments      Exercises     Assessment/Plan    PT Assessment Patient needs continued PT services  PT Problem List Decreased strength;Decreased activity tolerance;Decreased balance;Decreased mobility;Cardiopulmonary status limiting activity       PT Treatment Interventions DME instruction;Gait training;Therapeutic activities;Therapeutic exercise;Balance training    PT Goals (Current goals can be found in the Care Plan section)  Acute Rehab PT Goals Patient Stated Goal: to go home PT Goal Formulation: With patient Time For Goal Achievement: 06/02/24 Potential to Achieve Goals: Good    Frequency Min 2X/week     Co-evaluation PT/OT/SLP Co-Evaluation/Treatment: Yes Reason for Co-Treatment: To address functional/ADL transfers;For patient/therapist safety PT goals addressed during session: Mobility/safety with mobility OT goals addressed during session: ADL's and self-care       AM-PAC PT 6 Clicks Mobility  Outcome Measure Help needed turning from your back to your side while in a flat bed without using bedrails?: None Help needed moving from lying on your back to sitting on the side of a flat bed without using bedrails?: None Help needed moving to and from a bed to a chair (including a wheelchair)?: A Little Help needed standing up from a chair using your arms (e.g., wheelchair or bedside chair)?: A Little Help needed to walk in hospital room?: A Lot Help needed climbing 3-5 steps with a railing? : A Lot 6 Click Score: 18    End of Session Equipment  Utilized During Treatment: Oxygen  Activity Tolerance: Patient limited by fatigue Patient left: in bed;with family/visitor present Nurse Communication: Mobility status PT Visit Diagnosis: Muscle weakness (generalized) (M62.81);Difficulty in walking, not elsewhere classified (R26.2)    Time: 8666-8642 PT Time Calculation (min) (ACUTE ONLY): 24 min   Charges:   PT Evaluation $PT Eval Low Complexity: 1 Low   PT General Charges $$ ACUTE PT VISIT: 1 Visit         Corean Dade, PT, DPT, GCS [phone]   Jennie Hannay 05/19/2024, 2:27 PM

## 2024-05-19 NOTE — Plan of Care (Signed)

## 2024-05-19 NOTE — Evaluation (Signed)
 Occupational Therapy Evaluation Patient Details Name: Breanna Christian MRN: [id number] DOB: Feb 11, 1941 Today's Date: 05/19/2024   History of Present Illness   Pt admitted for HCAP. Pt with PMH of CHF< non hodgkins lymphoma, Sjogrens Dx, and COPD- on 2L of O2.     Clinical Impressions Patient presenting with decreased Ind in self care,balance, functional mobility/transfers, endurance, and safety awareness. Patient reports being mod I at baseline and living at home alone but has significant family support. Patient currently functioning at supervision- min guard for bed mobility. Pt demonstrates ability to don/doff sock and sits and feeds self while seated on EOB. Pt fatigues very quickly. She is on 3Ls via [place] with O2 saturation being above 90% throughout but you can see that breathing takes effort. Pt needs education on energy conservation. Equipment recommended for energy conservation with self care and mobility tasks.  Patient will benefit from acute OT to increase overall independence in the areas of ADLs, functional mobility,and safety awareness in order to safely discharge.     If plan is discharge home, recommend the following:   A little help with walking and/or transfers;A little help with bathing/dressing/bathroom;Assistance with cooking/housework;Assist for transportation;Help with stairs or ramp for entrance     Functional Status Assessment   Patient has had a recent decline in their functional status and demonstrates the ability to make significant improvements in function in a reasonable and predictable amount of time.     Equipment Recommendations   BSC/3in1;Other (comment) (rollator)     Precautions/Restrictions   Precautions Precautions: Fall Recall of Precautions/Restrictions: Intact     Mobility Bed Mobility Overal bed mobility: Needs Assistance Bed Mobility: Supine to Sit, Sit to Supine     Supine to sit: Supervision Sit to supine: Min assist         Transfers                   General transfer comment: deferred secondary to fatigue/weakness. Son reports she has been ambulatory to bathroom this date.      Balance Overall balance assessment: Modified Independent                                         ADL either performed or assessed with clinical judgement   ADL Overall ADL's : Needs assistance/impaired Eating/Feeding: Set up;Sitting;Supervision/ safety                                     General ADL Comments: Pt likely needing min A for self care tasks secondary to fatigue and energy conservation needs     Vision Patient Visual Report: No change from baseline              Pertinent Vitals/Pain Pain Assessment Pain Assessment: No/denies pain     Extremity/Trunk Assessment Upper Extremity Assessment Upper Extremity Assessment: Generalized weakness   Lower Extremity Assessment Lower Extremity Assessment: Generalized weakness       Communication Communication Communication: Impaired Factors Affecting Communication: Non - English speaking, interpreter not available;Hearing impaired   Cognition Arousal: Lethargic Behavior During Therapy: Flat affect Cognition: No apparent impairments                               Following commands: Intact  Cueing  General Comments   Cueing Techniques: Verbal cues              Home Living Family/patient expects to be discharged to:: Private residence Living Arrangements: Alone Available Help at Discharge: Family;Available PRN/intermittently Type of Home: House Home Access: Stairs to enter [employer] of Steps: 2 Entrance Stairs-Rails: Left Home Layout: One level     Bathroom Shower/Tub: Walk-in shower         Home Equipment: Shower seat - built in;Wheelchair - manual   Additional Comments: Per discussion with son, pt will likely dc to private residence (described above) or to  daughters home (is able to stay downstairs) with family assistance. Family also agreeable to PCA services      Prior Functioning/Environment Prior Level of Function : Independent/Modified Independent             Mobility Comments: indep, no falls. ADLs Comments: sits for showering, is able to self dress.    OT Problem List: Decreased strength;Decreased safety awareness;Decreased activity tolerance;Decreased knowledge of use of DME or AE;Impaired balance (sitting and/or standing);Cardiopulmonary status limiting activity   OT Treatment/Interventions: Self-care/ADL training;Therapeutic activities;Therapeutic exercise;Energy conservation;DME and/or AE instruction;Balance training;Manual therapy;Patient/family education      OT Goals(Current goals can be found in the care plan section)   Acute Rehab OT Goals Patient Stated Goal: to go home with family support OT Goal Formulation: With patient/family Time For Goal Achievement: 06/02/24 Potential to Achieve Goals: Fair ADL Goals Pt Will Perform Grooming: with modified independence;standing Pt Will Perform Lower Body Dressing: with modified independence Pt Will Transfer to Toilet: with modified independence Pt Will Perform Toileting - Clothing Manipulation and hygiene: with modified independence   OT Frequency:  Min 2X/week    Co-evaluation PT/OT/SLP Co-Evaluation/Treatment: Yes Reason for Co-Treatment: To address functional/ADL transfers;For patient/therapist safety PT goals addressed during session: Mobility/safety with mobility OT goals addressed during session: ADL's and self-care      AM-PAC OT 6 Clicks Daily Activity     Outcome Measure Help from another person eating meals?: None Help from another person taking care of personal grooming?: None Help from another person toileting, which includes using toliet, bedpan, or urinal?: A Little Help from another person bathing (including washing, rinsing, drying)?: A  Little Help from another person to put on and taking off regular upper body clothing?: A Little Help from another person to put on and taking off regular lower body clothing?: A Little 6 Click Score: 20   End of Session Equipment Utilized During Treatment: Oxygen  Nurse Communication: Mobility status  Activity Tolerance: Patient limited by fatigue Patient left: in bed;with call bell/phone within reach;with bed alarm set;with family/visitor present  OT Visit Diagnosis: Unsteadiness on feet (R26.81);Repeated falls (R29.6);Muscle weakness (generalized) (M62.81)                Time: 8667-8642 OT Time Calculation (min): 25 min Charges:  OT General Charges $OT Visit: 1 Visit OT Evaluation $OT Eval Low Complexity: 1 Low Izetta Claude, MS, OTR/L , CBIS ascom [phone]  05/19/24, 2:50 PM

## 2024-05-19 NOTE — Consult Note (Signed)
 PULMONOLOGY         Date: 05/19/2024,   MRN# [id number] Breanna Christian [birth year]     AdmissionWeight: 44.5 kg                 CurrentWeight: 45.7 kg  Referring provider: Dr Tobie   CHIEF COMPLAINT:   Acute on chronic hypoxemic hypercapnic respiratory failre   HISTORY OF PRESENT ILLNESS   83 yo F with hx of cardiac dysfunction with diastolic CHF, Non hodgkins lymphoma s/p chemoradiation, Sjogrens disease , bronchiectasis with MAI infection who came in for dyspnea on exertion and worsening SOB at rest. She was febrile on arrival with Tmax >101 with leukocytosis, negative for flu/rsv/covid, tachypneic with resp distress.  She had VBG with resp alkalosis due to hyperventilation and severe hypoxemia, she required bipap on admission and was hospitalized for this. She had CXR with r pleural effusion and right lung pneumonia. PCCM for further investigation and management.    PAST MEDICAL HISTORY   Past Medical History:  Diagnosis Date   Bronchiectasis (HCC) 2011   COPD (chronic obstructive pulmonary disease) (HCC)    HOH (hard of hearing)    Hypertension    Lung nodules    Mycobacterium avium complex colonization    Non-Hodgkin lymphoma (HCC) 2003   Personal history of chemotherapy    Pneumonia    FREQUENT IN PAST    H/O MYCOBACTERIAL   Rheumatoid arthritis (HCC)    Sjogren's syndrome (HCC)    Xerostomia      SURGICAL HISTORY   Past Surgical History:  Procedure Laterality Date   APPENDECTOMY     BRONCHOSCOPY  02/2016   CATARACT EXTRACTION W/PHACO Right 10/26/2016   Procedure: CATARACT EXTRACTION PHACO AND INTRAOCULAR LENS PLACEMENT (IOC);  Surgeon: Steven Dingeldein, MD;  Location: [facility];  Service: Ophthalmology;  Laterality: Right;  US  2.17 AP% 26.5 CDE 56.86 Fluid pack lot # [id number]   FLEXIBLE BRONCHOSCOPY Bilateral 03/16/2016   Procedure: FLEXIBLE BRONCHOSCOPY;  Surgeon: Elfreda DELENA Bathe, MD;  Location: [facility];  Service: Pulmonary;  Laterality:  Bilateral;   FLEXIBLE BRONCHOSCOPY N/A 03/03/2017   Procedure: FLEXIBLE BRONCHOSCOPY;  Surgeon: Elfreda DELENA Bathe, MD;  Location: [facility];  Service: Pulmonary;  Laterality: N/A;   TUBAL LIGATION       FAMILY HISTORY   Family History  Problem Relation Age of Onset   Cancer Sister    Breast cancer Neg Hx      SOCIAL HISTORY   Social History   Tobacco Use   Smoking status: Never   Smokeless tobacco: Never  Vaping Use   Vaping status: Never Used  Substance Use Topics   Alcohol use: No   Drug use: No     MEDICATIONS    Home Medication:    Current Medication:  Current Facility-Administered Medications:    acetaminophen  (TYLENOL ) tablet 650 mg, 650 mg, Oral, Q6H PRN, Niu, Xilin, MD   albuterol  (PROVENTIL ) (2.5 MG/3ML) 0.083% nebulizer solution 2.5 mg, 2.5 mg, Inhalation, Q4H PRN, Niu, Xilin, MD   ALPRAZolam  (XANAX ) tablet 0.25 mg, 0.25 mg, Oral, TID PRN, Niu, Xilin, MD, 0.25 mg at 05/18/24 2215   azithromycin  (ZITHROMAX ) tablet 250 mg, 250 mg, Oral, Daily, Niu, Xilin, MD, 250 mg at 05/19/24 0940   bisoprolol  (ZEBETA ) tablet 2.5 mg, 2.5 mg, Oral, BID, Patel, Sona, MD, 2.5 mg at 05/19/24 9048   budesonide -glycopyrrolate -formoterol  (BREZTRI ) 160-9-4.8 MCG/ACT inhaler 2 puff, 2 puff, Inhalation, BID, Patel, Sona, MD, 2 puff at 05/19/24 [phone]  ceFEPIme  (MAXIPIME ) 2 g in sodium chloride  0.9 % 100 mL IVPB, 2 g, Intravenous, Q12H, Nazari, Walid A, RPH, Last Rate: 200 mL/hr at 05/19/24 0944, 2 g at 05/19/24 0944   dextromethorphan-guaiFENesin (MUCINEX DM) 30-600 MG per 12 hr tablet 1 tablet, 1 tablet, Oral, BID PRN, Niu, Xilin, MD   enoxaparin  (LOVENOX ) injection 30 mg, 30 mg, Subcutaneous, Q24H, Niu, Xilin, MD, 30 mg at 05/18/24 2213   ethambutol  (MYAMBUTOL ) tablet 400 mg, 400 mg, Oral, Daily, Niu, Xilin, MD, 400 mg at 05/19/24 0940   hydrALAZINE (APRESOLINE) injection 5 mg, 5 mg, Intravenous, Q2H PRN, Niu, Xilin, MD   ibuprofen (ADVIL) tablet 400 mg, 400 mg, Oral, Once, Niu, Xilin,  MD   ipratropium-albuterol  (DUONEB) 0.5-2.5 (3) MG/3ML nebulizer solution 3 mL, 3 mL, Nebulization, Q6H, Patel, Sona, MD, 3 mL at 05/19/24 0735   Lifitegrast 5 % SOLN 1 drop, 1 drop, Ophthalmic, BID, Niu, Xilin, MD   midodrine (PROAMATINE) tablet 10 mg, 10 mg, Oral, TID WC, Niu, Xilin, MD, 10 mg at 05/19/24 0940   mirtazapine  (REMERON ) tablet 7.5 mg, 7.5 mg, Oral, QHS, Niu, Xilin, MD, 7.5 mg at 05/18/24 2215   multivitamin with minerals tablet 1 tablet, 1 tablet, Oral, Daily, Patel, Sona, MD, 1 tablet at 05/19/24 0940   omega-3 acid ethyl esters (LOVAZA) capsule 1 g, 1 capsule, Oral, Daily, Patel, Sona, MD, 1 g at 05/19/24 0940   ondansetron  (ZOFRAN ) injection 4 mg, 4 mg, Intravenous, Q8H PRN, Niu, Xilin, MD   Oral care mouth rinse, 15 mL, Mouth Rinse, PRN, Tobie Calix, MD   rifampin  (RIFADIN ) capsule 150 mg, 150 mg, Oral, Daily, Niu, Xilin, MD    ALLERGIES   Prednisone      REVIEW OF SYSTEMS    Review of Systems:  Gen:  Denies  fever, sweats, chills weigh loss  HEENT: Denies blurred vision, double vision, ear pain, eye pain, hearing loss, nose bleeds, sore throat Cardiac:  No dizziness, chest pain or heaviness, chest tightness,edema Resp:   reports dyspnea chronically  Gi: Denies swallowing difficulty, stomach pain, nausea or vomiting, diarrhea, constipation, bowel incontinence Gu:  Denies bladder incontinence, burning urine Ext:   Denies Joint pain, stiffness or swelling Skin: Denies  skin rash, easy bruising or bleeding or hives Endoc:  Denies polyuria, polydipsia , polyphagia or weight change Psych:   Denies depression, insomnia or hallucinations   Other:  All other systems negative   VS: BP (!) 105/54 (BP Location: Left Arm)   Pulse (!) 117   Temp 98.6 F (37 C)   Resp 17   Ht 5' 1 (1.549 m)   Wt 45.7 kg   SpO2 97%   BMI 19.03 kg/m      PHYSICAL EXAM    GENERAL:NAD, no fevers, chills, no weakness no fatigue HEAD: Normocephalic, atraumatic.  EYES: Pupils  equal, round, reactive to light. Extraocular muscles intact. No scleral icterus.  MOUTH: Moist mucosal membrane. Dentition intact. No abscess noted.  EAR, NOSE, THROAT: Clear without exudates. No external lesions.  NECK: Supple. No thyromegaly. No nodules. No JVD.  PULMONARY: decreased breath sounds with mild rhonchi worse at bases bilaterally.  CARDIOVASCULAR: S1 and S2. Regular rate and rhythm. No murmurs, rubs, or gallops. No edema. Pedal pulses 2+ bilaterally.  GASTROINTESTINAL: Soft, nontender, nondistended. No masses. Positive bowel sounds. No hepatosplenomegaly.  MUSCULOSKELETAL: No swelling, clubbing, or edema. Range of motion full in all extremities.  NEUROLOGIC: Cranial nerves II through XII are intact. No gross focal neurological deficits. Sensation intact. Reflexes  intact.  SKIN: No ulceration, lesions, rashes, or cyanosis. Skin warm and dry. Turgor intact.  PSYCHIATRIC: Mood, affect within normal limits. The patient is awake, alert and oriented x 3. Insight, judgment intact.       IMAGING       ASSESSMENT/PLAN   Acute on chronic hypoxemic hypercapnic respiratory failure     -report of possible MAI, will send off cultures for AFB today to check presence of this bacteria     - Respiratory viral panel ordered      - daily CRP      - procalcitonin trend     - MRSA is negative     - RSV/FLU/COVID is negative     -Cefepime  and zithromax  is a good choice for this patient as it will cover MAI and other bacteria.  She's also on ethambutol  which is used for mycobacterial infections     Transient hypotension     Possibly drug related post bezo    - cotinue midodrine and wean for MAP goal >65   Type 2 NSTEMI    - diurese as able     - consider cardiology evaluation on outpatient basis   Chronic respiratory failure with hypercapnia    -patient has NIV at home     Bronchiectasis with right middle lobe syndrome    Continue chest TP, continue flutter valve        Thank you for allowing me to participate in the care of this patient.   Patient/Family are satisfied with care plan and all questions have been answered.    Provider disclosure: Patient with at least one acute or chronic illness or injury that poses a threat to life or bodily function and is being managed actively during this encounter.  All of the below services have been performed independently by signing provider:  review of prior documentation from internal and or external health records.  Review of previous and current lab results.  Interview and comprehensive assessment during patient visit today. Review of current and previous chest radiographs/CT scans. Discussion of management and test interpretation with health care team and patient/family.   This document was prepared using Dragon voice recognition software and may include unintentional dictation errors.     Lakoda Mcanany, M.D.  Division of Pulmonary & Critical Care Medicine

## 2024-05-20 ENCOUNTER — Inpatient Hospital Stay: Admitting: Nurse Practitioner

## 2024-05-20 DIAGNOSIS — A419 Sepsis, unspecified organism: Secondary | ICD-10-CM | POA: Diagnosis not present

## 2024-05-20 DIAGNOSIS — A310 Pulmonary mycobacterial infection: Secondary | ICD-10-CM | POA: Diagnosis not present

## 2024-05-20 DIAGNOSIS — J189 Pneumonia, unspecified organism: Secondary | ICD-10-CM | POA: Diagnosis not present

## 2024-05-20 DIAGNOSIS — J9622 Acute and chronic respiratory failure with hypercapnia: Secondary | ICD-10-CM

## 2024-05-20 DIAGNOSIS — J9601 Acute respiratory failure with hypoxia: Secondary | ICD-10-CM | POA: Diagnosis not present

## 2024-05-20 LAB — BASIC METABOLIC PANEL WITH GFR
Anion gap: 11 (ref 5–15)
BUN: 38 mg/dL — ABNORMAL HIGH (ref 8–23)
CO2: 33 mmol/L — ABNORMAL HIGH (ref 22–32)
Calcium: 8.6 mg/dL — ABNORMAL LOW (ref 8.9–10.3)
Chloride: 91 mmol/L — ABNORMAL LOW (ref 98–111)
Creatinine, Ser: 0.5 mg/dL (ref 0.44–1.00)
GFR, Estimated: >60 mL/min (ref >60)
Glucose, Bld: 110 mg/dL — ABNORMAL HIGH (ref 70–99)
Potassium: 4.8 mmol/L (ref 3.5–5.1)
Sodium: 135 mmol/L (ref 135–145)

## 2024-05-20 LAB — BLOOD GAS, VENOUS
Acid-Base Excess: 11.1 mmol/L — ABNORMAL HIGH (ref 0.0–2.0)
Acid-Base Excess: 12.9 mmol/L — ABNORMAL HIGH (ref 0.0–2.0)
Bicarbonate: 41.8 mmol/L — ABNORMAL HIGH (ref 20.0–28.0)
Bicarbonate: 41.6 mmol/L — ABNORMAL HIGH (ref 20.0–28.0)
Delivery systems: POSITIVE
FIO2: 40 %
O2 Saturation: 100 %
O2 Saturation: 97.1 %
Patient temperature: 37
Patient temperature: 37
pCO2, Ven: 89 mmHg (ref 44–60)
pCO2, Ven: 72 mmHg (ref 44–60)
pH, Ven: 7.28 (ref 7.25–7.43)
pH, Ven: 7.37 (ref 7.25–7.43)
pO2, Ven: 179 mmHg — ABNORMAL HIGH (ref 32–45)
pO2, Ven: 70 mmHg — ABNORMAL HIGH (ref 32–45)

## 2024-05-20 LAB — AMMONIA: Ammonia: 40 umol/L — ABNORMAL HIGH (ref 9–35)

## 2024-05-20 MED ORDER — IPRATROPIUM-ALBUTEROL 0.5-2.5 (3) MG/3ML IN SOLN
3.0000 mL | Freq: Three times a day (TID) | RESPIRATORY_TRACT | Status: DC
  Administered 2024-05-20 – 2024-05-21 (×3): 3 mL via RESPIRATORY_TRACT
  Filled 2024-05-20 (×3): qty 3

## 2024-05-20 MED ORDER — SODIUM CHLORIDE 0.9 % IV SOLN: INTRAVENOUS | Status: DC

## 2024-05-20 MED ORDER — PIPERACILLIN-TAZOBACTAM 3.375 G IVPB
3.3750 g | Freq: Three times a day (TID) | INTRAVENOUS | Status: DC
  Administered 2024-05-20 – 2024-05-21 (×2): 3.375 g via INTRAVENOUS
  Filled 2024-05-20 (×2): qty 50

## 2024-05-20 MED ORDER — SODIUM CHLORIDE 0.9 % IV BOLUS
250.0000 mL | Freq: Once | INTRAVENOUS | Status: AC
  Administered 2024-05-20: 250 mL via INTRAVENOUS

## 2024-05-20 NOTE — Consult Note (Signed)
 Infectious Disease     Reason for Consult:PNA   Referring Physician: Dr Tobie Date of Admission:  05/17/2024   Principal Problem:   HCAP (healthcare-associated pneumonia) Active Problems:   MAI (mycobacterium avium-intracellulare) infection (HCC)   Essential hypertension   Sjogren syndrome (HCC)   Acute on chronic respiratory failure with hypoxia (HCC)   Bronchiectasis (HCC)   COPD (chronic obstructive pulmonary disease) (HCC)   Chronic diastolic CHF (congestive heart failure) (HCC)   Depression with anxiety   Sepsis (HCC)   Myocardial injury   Pneumonia of both lungs due to infectious organism   Acute respiratory failure with hypoxia (HCC)   HPI: Breanna Christian is a 83 y.o. female admitted June 27 with short of breath and respiratory failure and fever.  She has a history of multiple medical problems including severe aortic stenosis, CHF, COPD on 2 L, , bronchiectasis, non-Hodgkin's lymphoma status post chemo and XRT, history of prior MAI, depression anxiety polyneuropathy.  On this admission daughter noted increasing shortness of breath and she was placed on CPAP.  She was having fevers and chills.   She was brought to the emergency room where she was found to have a temperature of 101.5 white count initially 14 but then up to 25.  She was started on vancomycin metronidazole and cefepime .  Blood cultures were done and are negative.  Chest x-ray June 27th showed no significant change in the dense consolidation in the right mid and lower lobe zones with adjacent pleural effusions patchy airspace opacities unchanged in the left lung base. She had previously had a CT of her chest last in January 2025 with progression of bilateral pulmonary nodules consistent with atypical pneumonia complete consolidation right middle lobe with bronchiectasis and bronchogram. She had a recent hospitalization June 20 to 24 with CHF exacerbation. Of note she seems to have been on treatment for MAI for  prolonged period of time.  Review of prior cultures in our system going back several years show in 2018 bronchoscopy negative for AFB. Prio cx in 2017  positive for Mycobacterium chelonae.  She was on cefepime  and Pseudomonas noted to be other more confused.  Was changed to ceftriaxone due to possible cefepime  neurotoxicity.  However son reports that soon after the ceftriaxone she decompensated became more confused some twitching and worsening respiratory status.  She had an ABG done with elevated CO2 of 89.  She has been started on BiPAP and is improving some.   Past Medical History:  Diagnosis Date   Bronchiectasis (HCC) 2011   COPD (chronic obstructive pulmonary disease) (HCC)    HOH (hard of hearing)    Hypertension    Lung nodules    Mycobacterium avium complex colonization    Non-Hodgkin lymphoma (HCC) 2003   Personal history of chemotherapy    Pneumonia    FREQUENT IN PAST    H/O MYCOBACTERIAL   Rheumatoid arthritis (HCC)    Sjogren's syndrome (HCC)    Xerostomia    Past Surgical History:  Procedure Laterality Date   APPENDECTOMY     BRONCHOSCOPY  02/2016   CATARACT EXTRACTION W/PHACO Right 10/26/2016   Procedure: CATARACT EXTRACTION PHACO AND INTRAOCULAR LENS PLACEMENT (IOC);  Surgeon: Steven Dingeldein, MD;  Location: [facility];  Service: Ophthalmology;  Laterality: Right;  US  2.17 AP% 26.5 CDE 56.86 Fluid pack lot # [id number]   FLEXIBLE BRONCHOSCOPY Bilateral 03/16/2016   Procedure: FLEXIBLE BRONCHOSCOPY;  Surgeon: Elfreda DELENA Bathe, MD;  Location: [facility];  Service: Pulmonary;  Laterality:  Bilateral;   FLEXIBLE BRONCHOSCOPY N/A 03/03/2017   Procedure: FLEXIBLE BRONCHOSCOPY;  Surgeon: Elfreda DELENA Bathe, MD;  Location: [facility];  Service: Pulmonary;  Laterality: N/A;   TUBAL LIGATION     Social History   Tobacco Use   Smoking status: Never   Smokeless tobacco: Never  Vaping Use   Vaping status: Never Used  Substance Use Topics   Alcohol use: No   Drug use: No   Family  History  Problem Relation Age of Onset   Cancer Sister    Breast cancer Neg Hx     Allergies:  Allergies  Allergen Reactions   Prednisone  Other (See Comments)    Swells my gums but pt still takes it if needed    Current antibiotics: Antibiotics Given (last 72 hours)     Date/Time Action Medication Dose Rate   05/17/24 1938 New Bag/Given   ceFEPIme  (MAXIPIME ) 2 g in sodium chloride  0.9 % 100 mL IVPB 2 g 200 mL/hr   05/17/24 1948 New Bag/Given   metroNIDAZOLE (FLAGYL) IVPB 500 mg 500 mg 100 mL/hr   05/17/24 2008 New Bag/Given   vancomycin (VANCOCIN) IVPB 1000 mg/200 mL premix 1,000 mg 200 mL/hr   05/18/24 0914 Given   azithromycin  (ZITHROMAX ) tablet 250 mg 250 mg    05/18/24 0914 Given   ethambutol  (MYAMBUTOL ) tablet 400 mg 400 mg    05/18/24 0917 New Bag/Given   ceFEPIme  (MAXIPIME ) 2 g in sodium chloride  0.9 % 100 mL IVPB 2 g 200 mL/hr   05/18/24 2008 New Bag/Given   ceFEPIme  (MAXIPIME ) 2 g in sodium chloride  0.9 % 100 mL IVPB 2 g 200 mL/hr   05/19/24 0940 Given   azithromycin  (ZITHROMAX ) tablet 250 mg 250 mg    05/19/24 0940 Given   ethambutol  (MYAMBUTOL ) tablet 400 mg 400 mg    05/19/24 0944 New Bag/Given   ceFEPIme  (MAXIPIME ) 2 g in sodium chloride  0.9 % 100 mL IVPB 2 g 200 mL/hr   05/19/24 2151 New Bag/Given   cefTRIAXone (ROCEPHIN) 2 g in sodium chloride  0.9 % 100 mL IVPB 2 g 200 mL/hr       MEDICATIONS:  azithromycin   250 mg Oral Daily   bisoprolol   2.5 mg Oral BID   budesonide -glycopyrrolate -formoterol   2 puff Inhalation BID   enoxaparin  (LOVENOX ) injection  30 mg Subcutaneous Q24H   ethambutol   400 mg Oral Daily   ibuprofen  400 mg Oral Once   ipratropium-albuterol   3 mL Nebulization TID   Lifitegrast  1 drop Ophthalmic BID   midodrine  10 mg Oral TID WC   mirtazapine   7.5 mg Oral QHS   multivitamin with minerals  1 tablet Oral Daily   omega-3 acid ethyl esters  1 capsule Oral Daily   rifampin   150 mg Oral Daily    Review of Systems - unable to  obtain  OBJECTIVE: Temp:  [97.8 F (36.6 C)-98.9 F (37.2 C)] 97.8 F (36.6 C) (06/30 1000) Pulse Rate:  [83-102] 83 (06/30 1204) Resp:  [18-35] 20 (06/30 1204) BP: (88-114)/(46-61) 88/46 (06/30 1204) SpO2:  [98 %-100 %] 100 % (06/30 1204) Physical Exam  Constitutional:  very thin, frail, on bipap HENT: [place]/AT, PERRLA, no scleral icterus Mouth/Throat: Bipap mask on Cardiovascular: Normal rate, regular rhythm and normal heart sounds.  Pulmonary/Chest:  bil rhonchi Neck = supple, no nuchal rigidity Abdominal: Soft. Bowel sounds are normal.  exhibits no distension. There is no tenderness.  Lymphadenopathy: no cervical adenopathy. No axillary adenopathy Neurological: awake but somewhat  lethargic Skin: Skin is warm and dry. No rash noted. No erythema.  Psychiatric: a normal mood and affect.  behavior is normal.    LABS: Results for orders placed or performed during the hospital encounter of 05/17/24 (from the past 48 hours)  CBC     Status: Abnormal   Collection Time: 05/19/24  4:17 AM  Result Value Ref Range   WBC 18.2 (H) 4.0 - 10.5 K/uL   RBC 4.03 3.87 - 5.11 MIL/uL   Hemoglobin 10.6 (L) 12.0 - 15.0 g/dL   HCT 66.2 (L) 63.9 - 53.9 %   MCV 83.6 80.0 - 100.0 fL   MCH 26.3 26.0 - 34.0 pg   MCHC 31.5 30.0 - 36.0 g/dL   RDW 86.2 88.4 - 84.4 %   Platelets 225 150 - 400 K/uL   nRBC 0.0 0.0 - 0.2 %    Comment: Performed at [facility], [address] [place], [place] [address]  Respiratory (~20 pathogens) panel by PCR     Status: None   Collection Time: 05/19/24 12:20 PM   Specimen: Nasopharyngeal Swab; Respiratory  Result Value Ref Range   Adenovirus NOT DETECTED NOT DETECTED   Coronavirus 229E NOT DETECTED NOT DETECTED    Comment: (NOTE) The Coronavirus on the Respiratory Panel, DOES NOT test for the novel  Coronavirus (2019 nCoV)    Coronavirus HKU1 NOT DETECTED NOT DETECTED   Coronavirus NL63 NOT DETECTED NOT DETECTED   Coronavirus OC43 NOT DETECTED NOT  DETECTED   Metapneumovirus NOT DETECTED NOT DETECTED   Rhinovirus / Enterovirus NOT DETECTED NOT DETECTED   Influenza A NOT DETECTED NOT DETECTED   Influenza B NOT DETECTED NOT DETECTED   Parainfluenza Virus 1 NOT DETECTED NOT DETECTED   Parainfluenza Virus 2 NOT DETECTED NOT DETECTED   Parainfluenza Virus 3 NOT DETECTED NOT DETECTED   Parainfluenza Virus 4 NOT DETECTED NOT DETECTED   Respiratory Syncytial Virus NOT DETECTED NOT DETECTED   Bordetella pertussis NOT DETECTED NOT DETECTED   Bordetella Parapertussis NOT DETECTED NOT DETECTED   Chlamydophila pneumoniae NOT DETECTED NOT DETECTED   Mycoplasma pneumoniae NOT DETECTED NOT DETECTED    Comment: Performed at [facility], [address] [place], [place] [address]  Ammonia     Status: Abnormal   Collection Time: 05/20/24  9:39 AM  Result Value Ref Range   Ammonia 40 (H) 9 - 35 umol/L    Comment: Performed at [facility], [address] [place], [place] [address]  Basic metabolic panel     Status: Abnormal   Collection Time: 05/20/24  9:39 AM  Result Value Ref Range   Sodium 135 135 - 145 mmol/L   Potassium 4.8 3.5 - 5.1 mmol/L   Chloride 91 (L) 98 - 111 mmol/L   CO2 33 (H) 22 - 32 mmol/L   Glucose, Bld 110 (H) 70 - 99 mg/dL    Comment: Glucose reference range applies only to samples taken after fasting for at least 8 hours.   BUN 38 (H) 8 - 23 mg/dL   Creatinine, Ser 9.49 0.44 - 1.00 mg/dL   Calcium 8.6 (L) 8.9 - 10.3 mg/dL   GFR, Estimated >39 >39 mL/min    Comment: (NOTE) Calculated using the CKD-EPI Creatinine Equation (2021)    Anion gap 11 5 - 15    Comment: Performed at [facility], [address] [place], [place] [address]  Blood gas, venous     Status: Abnormal   Collection Time:  05/20/24  9:40 AM  Result Value Ref Range   pH, Ven 7.28 7.25 - 7.43   pCO2, Ven 89 (HH) 44 - 60 mmHg    Comment: CRITICAL RESULT CALLED TO, READ BACK BY AND VERIFIED WITH:  CRYSTAL LITTLE RN @1044   ON6/30/2025 RE    pO2, Ven 179 (H) 32 - 45 mmHg   Bicarbonate 41.8 (H) 20.0 - 28.0 mmol/L   Acid-Base Excess 11.1 (H) 0.0 - 2.0 mmol/L   O2 Saturation 100 %   Patient temperature 37.0    Collection site VEIN     Comment: Performed at [facility], [address] [place], [place] [address]   No components found for: ESR, C REACTIVE PROTEIN MICRO: Recent Results (from the past 720 hours)  Culture, blood (routine x 2)     Status: None (Preliminary result)   Collection Time: 05/17/24  7:18 PM   Specimen: BLOOD  Result Value Ref Range Status   Specimen Description BLOOD RIGHT ANTECUBITAL  Final   Special Requests   Final    BOTTLES DRAWN AEROBIC AND ANAEROBIC Blood Culture results may not be optimal due to an inadequate volume of blood received in culture bottles   Culture   Final    NO GROWTH 3 DAYS Performed at [facility], [address], [place], [place] [address]    Report Status PENDING  Incomplete  Culture, blood (routine x 2)     Status: None (Preliminary result)   Collection Time: 05/17/24  7:18 PM   Specimen: BLOOD  Result Value Ref Range Status   Specimen Description BLOOD LEFT ANTECUBITAL  Final   Special Requests   Final    BOTTLES DRAWN AEROBIC AND ANAEROBIC Blood Culture results may not be optimal due to an inadequate volume of blood received in culture bottles   Culture   Final    NO GROWTH 3 DAYS Performed at [facility], [address] [place], [place] [address]    Report Status PENDING  Incomplete  Resp panel by RT-PCR (RSV, Flu A&B, Covid) Anterior Nasal Swab     Status: None   Collection Time: 05/17/24  7:18 PM   Specimen: Anterior Nasal Swab  Result Value Ref Range Status   SARS Coronavirus 2 by RT PCR NEGATIVE NEGATIVE Final    Comment: (NOTE) SARS-CoV-2 target nucleic acids are NOT DETECTED.  The SARS-CoV-2 RNA is generally detectable in upper respiratory specimens during the acute phase of infection. The  lowest concentration of SARS-CoV-2 viral copies this assay can detect is 138 copies/mL. A negative result does not preclude SARS-Cov-2 infection and should not be used as the sole basis for treatment or other patient management decisions. A negative result may occur with  improper specimen collection/handling, submission of specimen other than nasopharyngeal swab, presence of viral mutation(s) within the areas targeted by this assay, and inadequate number of viral copies(<138 copies/mL). A negative result must be combined with clinical observations, patient history, and epidemiological information. The expected result is Negative.  Fact Sheet for Patients:  BloggerCourse.com  Fact Sheet for Healthcare Providers:  SeriousBroker.it  This test is no t yet approved or cleared by the [place]  FDA and  has been authorized for detection and/or diagnosis of SARS-CoV-2 by FDA under an Emergency Use Authorization (EUA). This EUA will remain  in effect (meaning this test can be used) for the duration of the COVID-19 declaration under Section 564(b)(1) of the Act, 21 U.S.C.section 360bbb-3(b)(1), unless the authorization is terminated  or revoked sooner.       Influenza A by PCR NEGATIVE NEGATIVE Final   Influenza B by PCR NEGATIVE NEGATIVE Final    Comment: (NOTE) The Xpert Xpress SARS-CoV-2/FLU/RSV plus assay is intended as an aid in the diagnosis of influenza from Nasopharyngeal swab specimens and should not be used as a sole basis for treatment. Nasal washings and aspirates are unacceptable for Xpert Xpress SARS-CoV-2/FLU/RSV testing.  Fact Sheet for Patients: BloggerCourse.com  Fact Sheet for Healthcare Providers: SeriousBroker.it  This test is not yet approved or cleared by the [place]  FDA and has been authorized for detection and/or diagnosis of SARS-CoV-2 by FDA under  an Emergency Use Authorization (EUA). This EUA will remain in effect (meaning this test can be used) for the duration of the COVID-19 declaration under Section 564(b)(1) of the Act, 21 U.S.C. section 360bbb-3(b)(1), unless the authorization is terminated or revoked.     Resp Syncytial Virus by PCR NEGATIVE NEGATIVE Final    Comment: (NOTE) Fact Sheet for Patients: BloggerCourse.com  Fact Sheet for Healthcare Providers: SeriousBroker.it  This test is not yet approved or cleared by the [place]  FDA and has been authorized for detection and/or diagnosis of SARS-CoV-2 by FDA under an Emergency Use Authorization (EUA). This EUA will remain in effect (meaning this test can be used) for the duration of the COVID-19 declaration under Section 564(b)(1) of the Act, 21 U.S.C. section 360bbb-3(b)(1), unless the authorization is terminated or revoked.  Performed at [facility], [address] [place], [place] [address]   MRSA Next Gen by PCR, Nasal     Status: None   Collection Time: 05/18/24  9:29 AM   Specimen: Nasal Mucosa; Nasal Swab  Result Value Ref Range Status   MRSA by PCR Next Gen NOT DETECTED NOT DETECTED Final    Comment: (NOTE) The GeneXpert MRSA Assay (FDA approved for NASAL specimens only), is one component of a comprehensive MRSA colonization surveillance program. It is not intended to diagnose MRSA infection nor to guide or monitor treatment for MRSA infections. Test performance is not FDA approved in patients less than 42 years old. Performed at [facility], [address] [place], [place] [address]   Respiratory (~20 pathogens) panel by PCR     Status: None   Collection Time: 05/19/24 12:20 PM   Specimen: Nasopharyngeal Swab; Respiratory  Result Value Ref Range Status   Adenovirus NOT DETECTED NOT DETECTED Final   Coronavirus 229E NOT DETECTED NOT DETECTED Final    Comment: (NOTE) The  Coronavirus on the Respiratory Panel, DOES NOT test for the novel  Coronavirus (2019 nCoV)    Coronavirus HKU1 NOT DETECTED NOT DETECTED Final   Coronavirus NL63 NOT DETECTED NOT DETECTED Final   Coronavirus OC43 NOT DETECTED NOT DETECTED Final   Metapneumovirus NOT DETECTED NOT DETECTED Final   Rhinovirus / Enterovirus NOT DETECTED NOT DETECTED Final   Influenza A NOT DETECTED NOT DETECTED Final   Influenza B NOT DETECTED NOT DETECTED Final   Parainfluenza Virus 1 NOT DETECTED NOT DETECTED Final   Parainfluenza Virus 2 NOT DETECTED NOT DETECTED Final   Parainfluenza Virus 3 NOT DETECTED NOT DETECTED Final   Parainfluenza Virus 4 NOT DETECTED NOT DETECTED Final   Respiratory Syncytial Virus NOT DETECTED NOT DETECTED Final   Bordetella pertussis NOT DETECTED NOT DETECTED Final   Bordetella Parapertussis NOT DETECTED NOT DETECTED Final   Chlamydophila pneumoniae NOT DETECTED NOT DETECTED Final   Mycoplasma pneumoniae NOT DETECTED NOT DETECTED Final  Comment: Performed at [facility], [address], [place], [place] [address]    IMAGING: DG Chest Portable 1 View Result Date: 05/17/2024 CLINICAL DATA:  SOB EXAM: PORTABLE CHEST - 1 VIEW COMPARISON:  May 10, 2024 FINDINGS: Similar appearance of the dense airspace consolidation in the right lung base with adjacent pleural effusion. Patchy airspace opacities also persist in the left lung base. The trace left effusion has resolved in the interim. No pneumothorax. No cardiomegaly. Aortic atherosclerosis. No acute fracture or destructive lesions. Multilevel thoracic osteophytosis. IMPRESSION: 1. No significant interval change of the dense consolidation in the right mid and lower lung zones with adjacent pleural effusion. Patchy airspace opacities are also unchanged in the left lung base. 2. Resolution of the trace left pleural effusion. Electronically Signed   By: Rogelia Myers M.D.   On: 05/17/2024 19:39   DG Chest 2 View Result Date:  05/10/2024 CLINICAL DATA:  Shortness of breath. Bilateral lower leg swelling. History of bronchiectasis, COPD, hypertension and lung nodules. EXAM: CHEST - 2 VIEW COMPARISON:  08/30/2021 and chest CT dated 11/28/2023. FINDINGS: Stable normal sized heart. Stable aortic arch calcifications. No significant change in chronic complete consolidation of the right middle lobe with bronchiectasis and right pleural fluid. No significant change in lingular scarring. Possible mild superimposed airspace opacity in the lingula. Previously demonstrated small probable infectious nodular areas in the left lung are faintly visible. Minimal left pleural effusion. Diffuse osteopenia. IMPRESSION: 1. Possible mild lingular pneumonia superimposed on chronic scarring. 2. No significant change in chronic complete consolidation of the right middle lobe with bronchiectasis and right pleural fluid. 3. Minimal left pleural effusion. 4. Previously demonstrated small probable infectious nodular areas in the left lung are faintly visible. Electronically Signed   By: Elspeth Bathe M.D.   On: 05/10/2024 17:29    Assessment:   Breanna Christian is a 83 y.o. female with a complicated pulmonary history multiple medical problems admitted with shortness of breath and fever. MRSA PCR neg. BNP was elevated at 330, lactic acid was 1.7.  Procalcitonin was not checked this admission last.  COVID flu RSV and respiratory panel were all negative.  Blood cultures negative.  She has been on rifampin  ethambutol  and azithromycin  for many years at least since about 2018 per her son.  This is for mycobacterial infection.  The only culture I can find is Mycobacterium chelonae in 2017.  She was treated initially for a year and had improvement but then when she was off it for 3 months it was restarted has been on it ever since. Currently her acute respiratory failure is likely multifactorial but she does likely have a component of healthcare associated pneumonia.   She seemed to have some confusion and twitching which may have been related to the cefepime  but the son reports he thinks it was started after her ceftriaxone was given. Recommendations Check cultures If possible for routine as well as AFB Will change coverage to Zosyn.  No need for MRSA coverage as MRSA PCR is negative CT chest when stable May benefit bronch if CT shows a lot of mucous plugging however she is very frail and may not be a great candidate. Will need to revisit AFB  treatment but continue for now.  Would really suggest stopping this as its unlikely to be doing much and having been on so long she likely has developed resistance if she does have any MAI. Plan is discussed with the patient.  Will see again tomorrow.  Thank you very much for allowing me to participate in the care of this patient. Please call with questions.   Alm SQUIBB. Epifanio, MD

## 2024-05-20 NOTE — Care Management Important Message (Signed)
 Important Message  Patient Details  Name: Breanna Christian MRN: [id number] Date of Birth: Mar 19, 1941   Important Message Given:  Yes - Medicare IM     [name] 05/20/2024, 12:00 PM

## 2024-05-20 NOTE — Progress Notes (Signed)
 [facility]  - [place] at [facility]   PATIENT NAME: Breanna Christian    MR#:  [id number]  DATE OF BIRTH:  [birth year]  SUBJECTIVE:   Patient son at bedside. Patient quite lethargic this morning. Unable to keep eyes open and not responding to verbal command. Sats 98% on 3 L. No fever. Son tells me patient's mentation on and off got worse after getting IV Rocephin. VITALS:  Blood pressure (!) 107/59, pulse (!) 102, temperature 97.8 F (36.6 C), temperature source Axillary, resp. rate (!) 35, height 5' 1 (1.549 m), weight 45.7 kg, SpO2 100%.  PHYSICAL EXAMINATION:   GENERAL:  83 y.o.-year-old patient with no acute distress. Thin cachectic frail, debilitated LUNGS distant breath sounds bilaterally, no wheezing CARDIOVASCULAR: S1, S2 normal. SM+   ABDOMEN: Soft, nontender, nondistended.  EXTREMITIES: No  edema b/l.    NEUROLOGIC: unable to assess lethargic moves extremities spontaneously   LABORATORY PANEL:  CBC Recent Labs  Lab 05/19/24 0417  WBC 18.2*  HGB 10.6*  HCT 33.7*  PLT 225    Chemistries  Recent Labs  Lab 05/17/24 1918 05/18/24 0339 05/20/24 0939  NA 132*   < > 135  K 4.2   < > 4.8  CL 82*   < > 91*  CO2 36*   < > 33*  GLUCOSE 178*   < > 110*  BUN 26*   < > 38*  CREATININE 0.53   < > 0.50  CALCIUM 9.0   < > 8.6*  AST 35  --   --   ALT 25  --   --   ALKPHOS 53  --   --   BILITOT 0.7  --   --    < > = values in this interval not displayed.     Assessment and Plan Breanna Christian is a 83 y.o. female with medical history significant of severe aortic stenosis, dCHF, COPD on 2L oxygen , Sjogren's, bronchiectasis, NHL (s/p chemo and XRT), MAI on chronic antibiotics, depression with anxiety, polyneuropathy, who presents with SOB and respiratory distress, fever   Patient was recently hospitalized from 6/20 - 6/24 due to CHF exacerbation.  Per her daughter at bedside, patient developed severe shortness of breath today, which has been  progressively worsening.    Her temperature is 101.5 in ED    CXR1. No significant interval change of the dense consolidation in the right mid and lower lung zones with adjacent pleural effusion. Patchy airspace opacities are also unchanged in the left lung base. 2. Resolution of the trace left pleural effusion.  Sepsis Acute on chronic respiratory failure with hypoxia and sepsis due to Right LL Pneumonia : metabolic encephalopathy due to CO2 narcosis  Patient has severe respiratory distress, requiring BiPAP, meets criteria for sepsis with WBC 14.4, heart rate 114, RR 23, fever 101.5.  Lactic acid is normal 1.9.  Due to recent CHF exacerbation and elevated BNP 335, will not give IV fluid.  - Incentive spirometry - Mucinex for cough  - Bronchodilators -  blood culture x2 negative, unable to give sputum smaple --IV cefepime  --On midodrine 10 mg 3 times daily - Hold Lasix   --pt follows Dr Fernand Carder --Consult Pulm--Dr Aleskerov--family ok--viral panel. Change to Rocephin since cefepime  may be making pt drowsy -- chest PT --6/30-- patient quite lethargic this morning. VBG showed P CO2 of 89. Discussed with pulmonary recommends BiPAP. Discussed with son need for BiPAP he was worried about patient's anxiety using BIPAP and the experience  she had in the ER. Other alternative is to use patients N IV from home -- start maintenance fluid. I am holding of IV Rocephin upon sounds request. -- ID consultation with Dr. Epifanio. -- Discussed overall poor prognosis given chronic lung issues for many years. Son understands. --BMP ok, NH4 40  MAI (mycobacterium avium-intracellulare) infection (HCC) --on azithromycin , ETM, rifampin  fro ~7-8 years (per Son)   Bronchiectasis and COPD (chronic obstructive pulmonary disease) (HCC):  -Bronchodilators and as needed Mucinex as above   Essential hypertension -IV hydralazine as needed - hole Bisoprolol  due to soft Bp   Chronic diastolic CHF (congestive  heart failure) (HCC):  Severe AS--not candidate for TAVR 2D echo on 02/14/2024 showed EF 60 to 65%.  Patient has trace leg edema, mildly elevated BNP 335, no JVD, does not seem to have CHF exacerbation. -hold lasix  due to soft Bp   Myocardial injury: Troponin 30.  Currently no active chest pain.  Patient had some right lower chest pain earlier which has resolved, possibly due to pneumonia.  Patient has a demand ischemia.   Sjogren syndrome (HCC): -Continue home eyedrops   Depression with anxiety -on Remeron  and as needed Xanax --holding for now due to lethargy     patient was seen by palliative care last admission which was few days ago.   Procedures:none Family communication :spoke with son  Consults : pulmonary, ID CODE STATUS: DNR/DNI--confirmed with son DVT Prophylaxis :enoxaparin  Level of care: Progressive Status is: Inpatient Remains inpatient appropriate because: acute on chronic respiratory failure with pneumonia    TOTAL TIME TAKING CARE OF THIS PATIENT: 45 minutes.  >50% time spent on counselling and coordination of care  Note: This dictation was prepared with Dragon dictation along with smaller phrase technology. Any transcriptional errors that result from this process are unintentional.  Leita Blanch M.D    [facility]   CC: Primary care physician; Fernand Sigrid HERO, MD

## 2024-05-20 NOTE — Progress Notes (Signed)
 PULMONOLOGY         Date: 05/20/2024,   MRN# [id number] Breanna Christian Jul 08, 1941     AdmissionWeight: 44.5 kg                 CurrentWeight: 45.7 kg  Referring provider: Dr Tobie   CHIEF COMPLAINT:   Acute on chronic hypoxemic hypercapnic respiratory failre   HISTORY OF PRESENT ILLNESS   83 yo F with hx of cardiac dysfunction with diastolic CHF, Non hodgkins lymphoma s/p chemoradiation, Sjogrens disease , bronchiectasis with MAI infection who came in for dyspnea on exertion and worsening SOB at rest. She was febrile on arrival with Tmax >101 with leukocytosis, negative for flu/rsv/covid, tachypneic with resp distress.  She had VBG with resp alkalosis due to hyperventilation and severe hypoxemia, she required bipap on admission and was hospitalized for this. She had CXR with r pleural effusion and right lung pneumonia. PCCM for further investigation and management.    05/20/24-patient seen at bedside. She appears to be lethargic and not better from prior.  She does have VBG today with hypercapnia and has been placed on bipap.  She continues to have transient hypotension.  She may benefit from upgrade to SDU if not better although she is DNR/DNI so unlikely to qualify for aggressive care.  Ive reached out to ID for additional input as this patient has been on multi drug antimicrbial therapy daily for past 8 years and is at risk of resistance and non bacterial infectious etiology such as fungal source.  Her viral workup has been negative including RSV/COVID/FLU and RVP and blood cultures are negative.  Son at bedside and wanted to stop rocephin due to concern that antibiotic is causing worsening encephalopathy    PAST MEDICAL HISTORY   Past Medical History:  Diagnosis Date   Bronchiectasis (HCC) 2011   COPD (chronic obstructive pulmonary disease) (HCC)    HOH (hard of hearing)    Hypertension    Lung nodules    Mycobacterium avium complex colonization    Non-Hodgkin  lymphoma (HCC) 2003   Personal history of chemotherapy    Pneumonia    FREQUENT IN PAST    H/O MYCOBACTERIAL   Rheumatoid arthritis (HCC)    Sjogren's syndrome (HCC)    Xerostomia      SURGICAL HISTORY   Past Surgical History:  Procedure Laterality Date   APPENDECTOMY     BRONCHOSCOPY  02/2016   CATARACT EXTRACTION W/PHACO Right 10/26/2016   Procedure: CATARACT EXTRACTION PHACO AND INTRAOCULAR LENS PLACEMENT (IOC);  Surgeon: Steven Dingeldein, MD;  Location: [facility] ORS;  Service: Ophthalmology;  Laterality: Right;  US  2.17 AP% 26.5 CDE 56.86 Fluid pack lot # [id number]   FLEXIBLE BRONCHOSCOPY Bilateral 03/16/2016   Procedure: FLEXIBLE BRONCHOSCOPY;  Surgeon: Elfreda DELENA Bathe, MD;  Location: [facility] OR[facility];  Service: Pulmonary;  Laterality: Bilateral;   FLEXIBLE BRONCHOSCOPY N/A 03/03/2017   Procedure: FLEXIBLE BRONCHOSCOPY;  Surgeon: Elfreda DELENA Bathe, MD;  Location: [facility] ORS;  Service: Pulmonary;  Laterality: N/A;   TUBAL LIGATION       FAMILY HISTORY   Family History  Problem Relation Age of Onset   Cancer Sister    Breast cancer Neg Hx      SOCIAL HISTORY   Social History   Tobacco Use   Smoking status: Never   Smokeless tobacco: Never  Vaping Use   Vaping status: Never Used  Substance Use Topics   Alcohol use: No   Drug use: No  MEDICATIONS    Home Medication:    Current Medication:  Current Facility-Administered Medications:    0.9 %  sodium chloride  infusion, , Intravenous, Continuous, Patel, Sona, MD   acetaminophen  (TYLENOL ) tablet 650 mg, 650 mg, Oral, Q6H PRN, Niu, Xilin, MD   albuterol  (PROVENTIL ) (2.5 MG/3ML) 0.083% nebulizer solution 2.5 mg, 2.5 mg, Inhalation, Q4H PRN, Niu, Xilin, MD   azithromycin  (ZITHROMAX ) tablet 250 mg, 250 mg, Oral, Daily, Niu, Xilin, MD, 250 mg at 05/19/24 0940   bisoprolol  (ZEBETA ) tablet 2.5 mg, 2.5 mg, Oral, BID, Patel, Sona, MD, 2.5 mg at 05/19/24 2143   budesonide -glycopyrrolate -formoterol  (BREZTRI ) 160-9-4.8 MCG/ACT inhaler  2 puff, 2 puff, Inhalation, BID, Tobie Calix, MD, 2 puff at 05/19/24 2155   dextromethorphan-guaiFENesin (MUCINEX DM) 30-600 MG per 12 hr tablet 1 tablet, 1 tablet, Oral, BID PRN, Niu, Xilin, MD   enoxaparin  (LOVENOX ) injection 30 mg, 30 mg, Subcutaneous, Q24H, Niu, Xilin, MD, 30 mg at 05/18/24 2213   ethambutol  (MYAMBUTOL ) tablet 400 mg, 400 mg, Oral, Daily, Niu, Xilin, MD, 400 mg at 05/19/24 0940   hydrALAZINE (APRESOLINE) injection 5 mg, 5 mg, Intravenous, Q2H PRN, Niu, Xilin, MD   ibuprofen (ADVIL) tablet 400 mg, 400 mg, Oral, Once, Niu, Xilin, MD   ipratropium-albuterol  (DUONEB) 0.5-2.5 (3) MG/3ML nebulizer solution 3 mL, 3 mL, Nebulization, TID, Patel, Sona, MD   Lifitegrast 5 % SOLN 1 drop, 1 drop, Ophthalmic, BID, Niu, Xilin, MD   midodrine (PROAMATINE) tablet 10 mg, 10 mg, Oral, TID WC, Niu, Xilin, MD, 10 mg at 05/19/24 1825   mirtazapine  (REMERON ) tablet 7.5 mg, 7.5 mg, Oral, QHS, Niu, Xilin, MD, 7.5 mg at 05/19/24 2142   multivitamin with minerals tablet 1 tablet, 1 tablet, Oral, Daily, Patel, Sona, MD, 1 tablet at 05/19/24 0940   omega-3 acid ethyl esters (LOVAZA) capsule 1 g, 1 capsule, Oral, Daily, Patel, Sona, MD, 1 g at 05/19/24 0940   ondansetron  (ZOFRAN ) injection 4 mg, 4 mg, Intravenous, Q8H PRN, Niu, Xilin, MD   Oral care mouth rinse, 15 mL, Mouth Rinse, PRN, Tobie Calix, MD   rifampin  (RIFADIN ) capsule 150 mg, 150 mg, Oral, Daily, Niu, Xilin, MD    ALLERGIES   Prednisone      REVIEW OF SYSTEMS    Review of Systems:  Gen:  Denies  fever, sweats, chills weigh loss  HEENT: Denies blurred vision, double vision, ear pain, eye pain, hearing loss, nose bleeds, sore throat Cardiac:  No dizziness, chest pain or heaviness, chest tightness,edema Resp:   reports dyspnea chronically  Gi: Denies swallowing difficulty, stomach pain, nausea or vomiting, diarrhea, constipation, bowel incontinence Gu:  Denies bladder incontinence, burning urine Ext:   Denies Joint pain,  stiffness or swelling Skin: Denies  skin rash, easy bruising or bleeding or hives Endoc:  Denies polyuria, polydipsia , polyphagia or weight change Psych:   Denies depression, insomnia or hallucinations   Other:  All other systems negative   VS: BP (!) 97/53 (BP Location: Left Arm)   Pulse (!) 102   Temp 98.9 F (37.2 C) (Axillary)   Resp (!) 35   Ht 5' 1 (1.549 m)   Wt 45.7 kg   SpO2 98%   BMI 19.03 kg/m      PHYSICAL EXAM    GENERAL:NAD, no fevers, chills, no weakness no fatigue HEAD: Normocephalic, atraumatic.  EYES: Pupils equal, round, reactive to light. Extraocular muscles intact. No scleral icterus.  MOUTH: Moist mucosal membrane. Dentition intact. No abscess noted.  EAR, NOSE, THROAT: Clear without  exudates. No external lesions.  NECK: Supple. No thyromegaly. No nodules. No JVD.  PULMONARY: decreased breath sounds with mild rhonchi worse at bases bilaterally.  CARDIOVASCULAR: S1 and S2. Regular rate and rhythm. No murmurs, rubs, or gallops. No edema. Pedal pulses 2+ bilaterally.  GASTROINTESTINAL: Soft, nontender, nondistended. No masses. Positive bowel sounds. No hepatosplenomegaly.  MUSCULOSKELETAL: No swelling, clubbing, or edema. Range of motion full in all extremities.  NEUROLOGIC: Cranial nerves II through XII are intact. No gross focal neurological deficits. Sensation intact. Reflexes intact.  SKIN: No ulceration, lesions, rashes, or cyanosis. Skin warm and dry. Turgor intact.  PSYCHIATRIC: Mood, affect within normal limits. The patient is awake, alert and oriented x 3. Insight, judgment intact.       IMAGING       ASSESSMENT/PLAN   Acute on chronic hypoxemic hypercapnic respiratory failure     -report of possible MAI, will send off cultures for AFB today to check presence of this bacteria     - Respiratory viral panel ordered      - daily CRP      - procalcitonin trend     - MRSA is negative     - RSV/FLU/COVID is negative     -Cefepime  has  been changed to rocephin due to encephalopathy  Transient hypotension     Possibly drug related vs due to distributive shock   - cotinue midodrine and wean for MAP goal >65   Type 2 NSTEMI    - diurese as able     - consider cardiology evaluation on outpatient basis   Chronic respiratory failure with hypercapnia    -patient has NIV at home     Bronchiectasis with right middle lobe syndrome    Continue chest TP, continue flutter valve    -CT chest with fluctuation infiltrates since 2011     Thank you for allowing me to participate in the care of this patient.   Patient/Family are satisfied with care plan and all questions have been answered.    Provider disclosure: Patient with at least one acute or chronic illness or injury that poses a threat to life or bodily function and is being managed actively during this encounter.  All of the below services have been performed independently by signing provider:  review of prior documentation from internal and or external health records.  Review of previous and current lab results.  Interview and comprehensive assessment during patient visit today. Review of current and previous chest radiographs/CT scans. Discussion of management and test interpretation with health care team and patient/family.   This document was prepared using Dragon voice recognition software and may include unintentional dictation errors.     Ryheem Jay, M.D.  Division of Pulmonary & Critical Care Medicine

## 2024-05-20 NOTE — Telephone Encounter (Signed)
 Please see

## 2024-05-20 NOTE — Progress Notes (Signed)
 PT Cancellation Note  Patient Details Name: Breanna Christian MRN: [id number] DOB: [birth year]   Cancelled Treatment:    Reason Eval/Treat Not Completed: Patient not medically ready.  Chart reviewed.  Pt currently on Bi-Pap.  Will hold PT at this time and re-attempt PT session at a later date/time.  Damien Caulk, PT 05/20/24, 4:04 PM

## 2024-05-20 NOTE — Progress Notes (Signed)
 OT Cancellation Note  Patient Details Name: Breanna Christian MRN: [id number] DOB: 1941/11/17   Cancelled Treatment:    Reason Eval/Treat Not Completed: Medical issues which prohibited therapy. Chart reviewed, pt currently on BiPAP. OT will hold treatment at this time and see when pt appropriate.   Tristin Vandeusen L. Pinky Ravan, OTR/L  05/20/24, 2:35 PM

## 2024-05-20 NOTE — Plan of Care (Signed)

## 2024-05-21 ENCOUNTER — Inpatient Hospital Stay

## 2024-05-21 DIAGNOSIS — J189 Pneumonia, unspecified organism: Secondary | ICD-10-CM | POA: Diagnosis not present

## 2024-05-21 LAB — ACID FAST SMEAR (AFB, MYCOBACTERIA): Acid Fast Smear: NEGATIVE

## 2024-05-21 MED ORDER — LORAZEPAM 2 MG/ML IJ SOLN
1.0000 mg | INTRAMUSCULAR | Status: DC | PRN
  Administered 2024-05-21: 1 mg via INTRAVENOUS
  Filled 2024-05-21: qty 1

## 2024-05-21 MED ORDER — FUROSEMIDE 10 MG/ML IJ SOLN
20.0000 mg | Freq: Once | INTRAMUSCULAR | Status: DC
  Filled 2024-05-21: qty 2

## 2024-05-21 MED ORDER — SODIUM CHLORIDE 0.9% FLUSH: 3.0000 mL | Freq: Two times a day (BID) | INTRAVENOUS | Status: DC

## 2024-05-21 MED ORDER — METHYLPREDNISOLONE SODIUM SUCC 40 MG IJ SOLR
40.0000 mg | INTRAMUSCULAR | Status: DC
  Administered 2024-05-21: 40 mg via INTRAVENOUS
  Filled 2024-05-21: qty 1

## 2024-05-21 MED ORDER — MORPHINE SULFATE (PF) 2 MG/ML IV SOLN
0.5000 mg | INTRAVENOUS | Status: DC | PRN
  Administered 2024-05-21: 0.5 mg via INTRAVENOUS
  Filled 2024-05-21: qty 1

## 2024-05-21 MED ORDER — GLYCOPYRROLATE 0.2 MG/ML IJ SOLN: 0.2000 mg | INTRAMUSCULAR | Status: DC | PRN

## 2024-05-21 MED ORDER — BISOPROLOL FUMARATE 5 MG PO TABS
5.0000 mg | ORAL_TABLET | Freq: Two times a day (BID) | ORAL | Status: DC
  Filled 2024-05-21: qty 1

## 2024-05-21 MED ORDER — GLYCOPYRROLATE 1 MG PO TABS: 1.0000 mg | ORAL_TABLET | ORAL | Status: DC | PRN

## 2024-05-21 MED ORDER — MORPHINE SULFATE (PF) 2 MG/ML IV SOLN
1.0000 mg | INTRAVENOUS | Status: DC | PRN
  Administered 2024-05-21: 2 mg via INTRAVENOUS
  Filled 2024-05-21: qty 1

## 2024-05-21 MED ORDER — SODIUM CHLORIDE 0.9% FLUSH: 3.0000 mL | INTRAVENOUS | Status: DC | PRN

## 2024-05-21 MED ORDER — FUROSEMIDE 40 MG PO TABS: 40.0000 mg | ORAL_TABLET | Freq: Every day | ORAL | Status: DC

## 2024-05-21 MED ORDER — ACETAMINOPHEN 325 MG PO TABS
650.0000 mg | ORAL_TABLET | Freq: Four times a day (QID) | ORAL | Status: DC | PRN
Start: 2024-05-21 — End: 2024-05-21

## 2024-05-21 MED ORDER — FUROSEMIDE 40 MG PO TABS
40.0000 mg | ORAL_TABLET | Freq: Once | ORAL | Status: DC
  Filled 2024-05-21: qty 1

## 2024-05-21 MED ORDER — POLYVINYL ALCOHOL 1.4 % OP SOLN: 1.0000 [drp] | Freq: Four times a day (QID) | OPHTHALMIC | Status: DC | PRN

## 2024-05-21 MED ORDER — ACETAMINOPHEN 650 MG RE SUPP: 650.0000 mg | Freq: Four times a day (QID) | RECTAL | Status: DC | PRN

## 2024-05-21 DEATH — deceased

## 2024-05-22 LAB — CULTURE, BLOOD (ROUTINE X 2)
Culture: NO GROWTH
Culture: NO GROWTH

## 2024-05-26 LAB — MTB-RIF NAA WITH AFB CULTURE, SPUTUM: Myco tuberculosis Complex: NOT DETECTED

## 2024-06-04 ENCOUNTER — Ambulatory Visit: Admitting: Nurse Practitioner

## 2024-06-05 LAB — ACID FAST ID BY PCR AND SUSCEPTIBILITIES

## 2024-06-07 LAB — ACID FAST ID BY PCR AND SUSCEPTIBILITIES
M Tuberculosis Complex: NEGATIVE
M avium complex: NEGATIVE

## 2024-06-07 LAB — ACID FAST CULTURE WITH REFLEXED SENSITIVITIES (MYCOBACTERIA): Acid Fast Culture: POSITIVE — AB

## 2024-06-21 NOTE — Progress Notes (Signed)
   06/07/24 1400  Spiritual Encounters  Type of Visit Initial  Care provided to: Pt and family  Referral source Code page  Reason for visit End-of-life  OnCall Visit No  Spiritual Framework  Presenting Themes Meaning/purpose/sources of inspiration  Community/Connection Family;Friend(s)  Interventions  Spiritual Care Interventions Made Established relationship of care and support;Compassionate presence;Reflective listening;Prayer  Intervention Outcomes  Outcomes Connection to spiritual care   Chaplain provided compassionate presence. Chaplain encouraged family and prayed with family.

## 2024-06-21 NOTE — Progress Notes (Signed)
 [facility]  - [place] at [facility]   PATIENT NAME: Breanna Christian    MR#:  [id number]  DATE OF BIRTH:  04-11-1941  SUBJECTIVE:   Son and dter in the room. Pt overall declined. Struggling to breathe. Did not tolerate Home NIV for more that 1 hour per dter. Using accessory muscles to breathe Not alert to have conversation VITALS:  Blood pressure 128/66, pulse (!) 115, temperature 98 F (36.7 C), temperature source Axillary, resp. rate 20, height 5' 1 (1.549 m), weight 45.7 kg, SpO2 99%.  PHYSICAL EXAMINATION:   GENERAL:  83 y.o.-year-old patient with no acute distress. Thin cachectic frail, debilitated LUNGS distant breath sounds bilaterally, no wheezing CARDIOVASCULAR: S1, S2 normal. SM+   ABDOMEN: Soft, nontender, nondistended.  EXTREMITIES: No  edema b/l.    NEUROLOGIC: unable to assess lethargic moves extremities spontaneously   LABORATORY PANEL:  CBC Recent Labs  Lab 05/19/24 0417  WBC 18.2*  HGB 10.6*  HCT 33.7*  PLT 225    Chemistries  Recent Labs  Lab 05/17/24 1918 05/18/24 0339 05/20/24 0939  NA 132*   < > 135  K 4.2   < > 4.8  CL 82*   < > 91*  CO2 36*   < > 33*  GLUCOSE 178*   < > 110*  BUN 26*   < > 38*  CREATININE 0.53   < > 0.50  CALCIUM 9.0   < > 8.6*  AST 35  --   --   ALT 25  --   --   ALKPHOS 53  --   --   BILITOT 0.7  --   --    < > = values in this interval not displayed.     Assessment and Plan Octa T Komatsu is a 83 y.o. female with medical history significant of severe aortic stenosis, dCHF, COPD on 2L oxygen , Sjogren's, bronchiectasis, NHL (s/p chemo and XRT), MAI on chronic antibiotics, depression with anxiety, polyneuropathy, who presents with SOB and respiratory distress, fever   Patient was recently hospitalized from 6/20 - 6/24 due to CHF exacerbation.  Per her daughter at bedside, patient developed severe shortness of breath today, which has been progressively worsening.    Her temperature is 101.5 in  ED    CXR1. No significant interval change of the dense consolidation in the right mid and lower lung zones with adjacent pleural effusion. Patchy airspace opacities are also unchanged in the left lung base. 2. Resolution of the trace left pleural effusion.  Sepsis Acute on chronic respiratory failure with hypoxia and sepsis due to Right LL Pneumonia : metabolic encephalopathy due to CO2 narcosis  Patient has severe respiratory distress, requiring BiPAP, meets criteria for sepsis with WBC 14.4, heart rate 114, RR 23, fever 101.5.  Lactic acid is normal 1.9.  Due to recent CHF exacerbation and elevated BNP 335, will not give IV fluid.  - Incentive spirometry - Mucinex for cough  - Bronchodilators -  blood culture x2 negative, unable to give sputum smaple --IV cefepime  --On midodrine 10 mg 3 times daily - Hold Lasix   --pt follows Dr Fernand Carder --Consult Pulm--Dr Aleskerov--family ok--viral panel. Change to Rocephin since cefepime  may be making pt drowsy -- chest PT --6/30-- patient quite lethargic this morning. VBG showed P CO2 of 89. Discussed with pulmonary recommends BiPAP. Discussed with son need for BiPAP he was worried about patient's anxiety using BIPAP and the experience  she had in the ER. Other alternative is  to use patients N IV from home -- start maintenance fluid. I am holding of IV Rocephin upon sounds request. -- ID consultation with Dr. Epifanio. -- Discussed overall poor prognosis given chronic lung issues for many years. Son understands. --BMP ok, NH4 40 --05-27-24--pt struggling to breathe. Cxr looks worsening pneumonia,infiltrate/effusion Poor mentation. Using accessory muscles. D/w son and dter about overall poor prognosis given chronicity of lung disease Discusses about comfort care and use morphine  to help with air hunger. Family agreeable to transition to Full Comfort care. Pt is DNR [facility] Above d/w Drs Aleskerov and Fitzgerald  Cxr from today  MAI (mycobacterium  avium-intracellulare) infection (HCC) -- was on azithromycin , ETM, rifampin  fro ~7-8 years    Bronchiectasis and COPD (chronic obstructive pulmonary disease) (HCC):  -Bronchodilators and as needed Mucinex as above   Essential hypertension   Chronic diastolic CHF (congestive heart failure) (HCC):  Severe AS--not candidate for TAVR 2D echo on 02/14/2024 showed EF 60 to 65%.  Patient has trace leg edema, mildly elevated BNP 335, no JVD, does not seem to have CHF exacerbation. -hold lasix  due to soft Bp   Myocardial injury: Troponin 30.  Currently no active chest pain.    Sjogren syndrome (HCC): -Continue home eyedrops   Depression with anxiety   Overall poor prognosis  patient was seen by palliative care last admission which was few days ago.   Procedures:none Family communication :spoke with son /dter Consults : pulmonary, ID CODE STATUS: DNR/DNI DVT Prophylaxis comfort care Level of care: Med-Surg Status is: Inpatient  Pt transitioned to comfort care.    TOTAL TIME TAKING CARE OF THIS PATIENT: 45 minutes.  >50% time spent on counselling and coordination of care  Note: This dictation was prepared with Dragon dictation along with smaller phrase technology. Any transcriptional errors that result from this process are unintentional.  Leita Blanch M.D    [facility]   CC: Primary care physician; Fernand Sigrid HERO, MD

## 2024-06-21 NOTE — Progress Notes (Signed)
 OT Cancellation Note  Patient Details Name: Breanna Christian MRN: [id number] DOB: 1941/08/19   Cancelled Treatment:    Reason Eval/Treat Not Completed: Other (comment). Chart reviewed. Per MD progress note, family agreeable to transition pt to Full Comfort care. Will sign off. Please re-consult should needs change.   Malaki Koury R., MPH, MS, OTR/L ascom [phone] 06/09/24, 10:34 AM

## 2024-06-21 NOTE — Progress Notes (Signed)
 Patient transition to comfort care, transferring to unit 1C. Report called to [employer]. Family at bedside and aware of plan of care. Family has all personal belongings.   [place] Retana, Cena Helling, RN

## 2024-06-21 NOTE — Progress Notes (Signed)
 PT Cancellation Note  Patient Details Name: KISSA CAMPOY MRN: [id number] DOB: [birth year]   Cancelled Treatment:    Reason Eval/Treat Not Completed: Other (comment).  Per MD note (from today) pt to transition to Full Comfort care.  Will sign off.  Please re-consult PT if needs change.  Damien Caulk, PT June 18, 2024, 12:18 PM

## 2024-06-21 NOTE — Progress Notes (Addendum)
 Pt s 4 rings handed over to her son Zachary

## 2024-06-21 NOTE — Death Summary Note (Signed)
 DEATH SUMMARY   Patient Details  Name: Breanna Christian MRN: [id number] DOB: 06-23-1941 ERE:Xyjw, Sigrid HERO, MD Admission/Discharge Information   Admit Date:  May 30, 2024  Date of Death: Date of Death: June 03, 2024  Time of Death: Time of Death: 1434  Length of Stay: 4   Principle Cause of death:  Sepsis secondary to pneumonia Acute on chronic hypoxic/hypercarbic respiratory failure Toxic Metabolic encephalopathy due to CO2 narcosis Severe bronchiectasis Severe AS   Acute on chronic respiratory failure with hypoxia and sepsis due to Right LL Pneumonia : Toxic Metabolic encephalopathy due to CO2 narcosis  Patient has severe respiratory distress, requiring BiPAP, meets criteria for sepsis with WBC 14.4, heart rate 114, RR 23, fever 101.5.  Lactic acid is normal 1.9.  Due to recent CHF exacerbation and elevated BNP 335 -- patient was started on broad-spectrum IV antibiotics. She received her pulmonary and infectious disease. Given patient's chronic poor lung condition she continued to deteriorate. BiPAP/NIV was tried to use with limited benefit given patient tolerance. Patient started struggling to breathe with use of accessory muscle and lethargic with metabolic encephalopathy. She developed CO2 narcosis. Discussion was led with patient's son and daughter. Full comfort care was initiated given overall poor prognosis for patient.   Patient expired on Jul 14, 2025at 1434.  MAI (mycobacterium avium-intracellulare) infection (HCC) -- was on azithromycin , ETM, rifampin  fro ~7-8 years     Bronchiectasis and COPD (chronic obstructive pulmonary disease) (HCC):  Essential hypertension  Chronic diastolic CHF (congestive heart failure) (HCC):  Severe AS--not candidate for TAVR   Sjogren syndrome [facility]): Depression with anxiety             Consultations: ID, pulmonary  The results of significant diagnostics from this hospitalization (including imaging, microbiology, ancillary and  laboratory) are listed below for reference.   Significant Diagnostic Studies: DG Chest Port 1 View Result Date: Jun 03, 2024 CLINICAL DATA:  Respiratory distress EXAM: PORTABLE CHEST 1 VIEW COMPARISON:  05-30-24 FINDINGS: Numerous leads and wires project over the chest. Midline trachea. Mild cardiomegaly. Atherosclerosis in the transverse aorta. Small to moderate right pleural effusion, increased. Tiny left pleural effusion, new. No pneumothorax. Interstitial thickening and coarsening, likely related to the clinical history of COPD. Significantly worsened right-sided aeration with airspace disease in the mid and lower lung. Suspect developing medial left lower lobe airspace disease. IMPRESSION: Worsened right mid and lower lung consolidation, most consistent with infection or aspiration. Increased right and new tiny left pleural effusions. Suspect developing left lower lobe airspace disease which could represent atelectasis or multifocal infection/aspiration. Aortic Atherosclerosis (ICD10-I70.0). Electronically Signed   By: Rockey Kilts M.D.   On: June 03, 2024 11:34   DG Chest Portable 1 View Result Date: 30-May-2024 CLINICAL DATA:  SOB EXAM: PORTABLE CHEST - 1 VIEW COMPARISON:  May 10, 2024 FINDINGS: Similar appearance of the dense airspace consolidation in the right lung base with adjacent pleural effusion. Patchy airspace opacities also persist in the left lung base. The trace left effusion has resolved in the interim. No pneumothorax. No cardiomegaly. Aortic atherosclerosis. No acute fracture or destructive lesions. Multilevel thoracic osteophytosis. IMPRESSION: 1. No significant interval change of the dense consolidation in the right mid and lower lung zones with adjacent pleural effusion. Patchy airspace opacities are also unchanged in the left lung base. 2. Resolution of the trace left pleural effusion. Electronically Signed   By: Rogelia Myers M.D.   On: May 30, 2024 19:39   DG Chest 2 View Result  Date: 05/10/2024 CLINICAL DATA:  Shortness of  breath. Bilateral lower leg swelling. History of bronchiectasis, COPD, hypertension and lung nodules. EXAM: CHEST - 2 VIEW COMPARISON:  08/30/2021 and chest CT dated 11/28/2023. FINDINGS: Stable normal sized heart. Stable aortic arch calcifications. No significant change in chronic complete consolidation of the right middle lobe with bronchiectasis and right pleural fluid. No significant change in lingular scarring. Possible mild superimposed airspace opacity in the lingula. Previously demonstrated small probable infectious nodular areas in the left lung are faintly visible. Minimal left pleural effusion. Diffuse osteopenia. IMPRESSION: 1. Possible mild lingular pneumonia superimposed on chronic scarring. 2. No significant change in chronic complete consolidation of the right middle lobe with bronchiectasis and right pleural fluid. 3. Minimal left pleural effusion. 4. Previously demonstrated small probable infectious nodular areas in the left lung are faintly visible. Electronically Signed   By: Elspeth Bathe M.D.   On: 05/10/2024 17:29    Microbiology: Recent Results (from the past 240 hours)  Culture, blood (routine x 2)     Status: None (Preliminary result)   Collection Time: 05/17/24  7:18 PM   Specimen: BLOOD  Result Value Ref Range Status   Specimen Description BLOOD RIGHT ANTECUBITAL  Final   Special Requests   Final    BOTTLES DRAWN AEROBIC AND ANAEROBIC Blood Culture results may not be optimal due to an inadequate volume of blood received in culture bottles   Culture   Final    NO GROWTH 4 DAYS Performed at [facility], [address], [place], [place] [address]    Report Status PENDING  Incomplete  Culture, blood (routine x 2)     Status: None (Preliminary result)   Collection Time: 05/17/24  7:18 PM   Specimen: BLOOD  Result Value Ref Range Status   Specimen Description BLOOD LEFT ANTECUBITAL  Final   Special Requests   Final     BOTTLES DRAWN AEROBIC AND ANAEROBIC Blood Culture results may not be optimal due to an inadequate volume of blood received in culture bottles   Culture   Final    NO GROWTH 4 DAYS Performed at [facility], [address], [place], [place] [address]    Report Status PENDING  Incomplete  Resp panel by RT-PCR (RSV, Flu A&B, Covid) Anterior Nasal Swab     Status: None   Collection Time: 05/17/24  7:18 PM   Specimen: Anterior Nasal Swab  Result Value Ref Range Status   SARS Coronavirus 2 by RT PCR NEGATIVE NEGATIVE Final    Comment: (NOTE) SARS-CoV-2 target nucleic acids are NOT DETECTED.  The SARS-CoV-2 RNA is generally detectable in upper respiratory specimens during the acute phase of infection. The lowest concentration of SARS-CoV-2 viral copies this assay can detect is 138 copies/mL. A negative result does not preclude SARS-Cov-2 infection and should not be used as the sole basis for treatment or other patient management decisions. A negative result may occur with  improper specimen collection/handling, submission of specimen other than nasopharyngeal swab, presence of viral mutation(s) within the areas targeted by this assay, and inadequate number of viral copies(<138 copies/mL). A negative result must be combined with clinical observations, patient history, and epidemiological information. The expected result is Negative.  Fact Sheet for Patients:  BloggerCourse.com  Fact Sheet for Healthcare Providers:  SeriousBroker.it  This test is no t yet approved or cleared by the [place]  FDA and  has been authorized for detection and/or diagnosis of SARS-CoV-2 by FDA under an Emergency Use Authorization (EUA). This EUA will remain  in effect (meaning this test can be used) for the duration of the COVID-19 declaration under Section 564(b)(1) of the Act, 21 U.S.C.section 360bbb-3(b)(1), unless the authorization is  terminated  or revoked sooner.       Influenza A by PCR NEGATIVE NEGATIVE Final   Influenza B by PCR NEGATIVE NEGATIVE Final    Comment: (NOTE) The Xpert Xpress SARS-CoV-2/FLU/RSV plus assay is intended as an aid in the diagnosis of influenza from Nasopharyngeal swab specimens and should not be used as a sole basis for treatment. Nasal washings and aspirates are unacceptable for Xpert Xpress SARS-CoV-2/FLU/RSV testing.  Fact Sheet for Patients: BloggerCourse.com  Fact Sheet for Healthcare Providers: SeriousBroker.it  This test is not yet approved or cleared by the [place]  FDA and has been authorized for detection and/or diagnosis of SARS-CoV-2 by FDA under an Emergency Use Authorization (EUA). This EUA will remain in effect (meaning this test can be used) for the duration of the COVID-19 declaration under Section 564(b)(1) of the Act, 21 U.S.C. section 360bbb-3(b)(1), unless the authorization is terminated or revoked.     Resp Syncytial Virus by PCR NEGATIVE NEGATIVE Final    Comment: (NOTE) Fact Sheet for Patients: BloggerCourse.com  Fact Sheet for Healthcare Providers: SeriousBroker.it  This test is not yet approved or cleared by the [place]  FDA and has been authorized for detection and/or diagnosis of SARS-CoV-2 by FDA under an Emergency Use Authorization (EUA). This EUA will remain in effect (meaning this test can be used) for the duration of the COVID-19 declaration under Section 564(b)(1) of the Act, 21 U.S.C. section 360bbb-3(b)(1), unless the authorization is terminated or revoked.  Performed at [facility], [address] [place], [place] [address]   MRSA Next Gen by PCR, Nasal     Status: None   Collection Time: 05/18/24  9:29 AM   Specimen: Nasal Mucosa; Nasal Swab  Result Value Ref Range Status   MRSA by PCR Next Gen NOT DETECTED NOT  DETECTED Final    Comment: (NOTE) The GeneXpert MRSA Assay (FDA approved for NASAL specimens only), is one component of a comprehensive MRSA colonization surveillance program. It is not intended to diagnose MRSA infection nor to guide or monitor treatment for MRSA infections. Test performance is not FDA approved in patients less than 92 years old. Performed at [facility], [address] [place], [place] [address]   Respiratory (~20 pathogens) panel by PCR     Status: None   Collection Time: 05/19/24 12:20 PM   Specimen: Nasopharyngeal Swab; Respiratory  Result Value Ref Range Status   Adenovirus NOT DETECTED NOT DETECTED Final   Coronavirus 229E NOT DETECTED NOT DETECTED Final    Comment: (NOTE) The Coronavirus on the Respiratory Panel, DOES NOT test for the novel  Coronavirus (2019 nCoV)    Coronavirus HKU1 NOT DETECTED NOT DETECTED Final   Coronavirus NL63 NOT DETECTED NOT DETECTED Final   Coronavirus OC43 NOT DETECTED NOT DETECTED Final   Metapneumovirus NOT DETECTED NOT DETECTED Final   Rhinovirus / Enterovirus NOT DETECTED NOT DETECTED Final   Influenza A NOT DETECTED NOT DETECTED Final   Influenza B NOT DETECTED NOT DETECTED Final   Parainfluenza Virus 1 NOT DETECTED NOT DETECTED Final   Parainfluenza Virus 2 NOT DETECTED NOT DETECTED Final   Parainfluenza Virus 3 NOT DETECTED NOT DETECTED Final   Parainfluenza Virus 4 NOT DETECTED NOT DETECTED Final   Respiratory Syncytial Virus NOT DETECTED NOT DETECTED Final   Bordetella pertussis NOT DETECTED  NOT DETECTED Final   Bordetella Parapertussis NOT DETECTED NOT DETECTED Final   Chlamydophila pneumoniae NOT DETECTED NOT DETECTED Final   Mycoplasma pneumoniae NOT DETECTED NOT DETECTED Final    Comment: Performed at [facility], [address], [place], [place] [address]  Acid Fast Smear (AFB)     Status: None   Collection Time: 05/19/24  5:05 PM   Specimen: Sputum; Respiratory  Result Value Ref Range  Status   AFB Specimen Processing Concentration  Final   Acid Fast Smear Negative  Final    Comment: (NOTE) Performed At: [facility] [address] [place], [place] [id number] Jennette Shorter MD Ey:[id number]    Source (AFB) SPUTUM  Final    Comment: Performed at [facility], [address] [place], [place] [address]  MTB-RIF NAA with AFB Culture, sputum     Status: None (Preliminary result)   Collection Time: 05/19/24  5:05 PM  Result Value Ref Range Status   Myco tuberculosis Complex REFERT  Final    Comment: (NOTE) Please refer to the following specimen for additional lab results. Please see [id number] 05/30/24-[place]    Rifampin  NOT PERFORMED  Final    Comment: Test not performed   Acid Fast Culture NOT PERFORMED  Final    Comment: (NOTE) Test not performed Performed At: [facility] [address] [place], [place] [id number] Jennette Shorter MD Ey:[id number]    Source (MTB RIF) PENDING  Incomplete    Time spent: 30 minutes  Signed: Leita Blanch, MD May 30, 2024

## 2024-06-21 NOTE — Progress Notes (Signed)
 PULMONOLOGY         Date: May 25, 2024,   MRN# [id number] Breanna Christian Apr 11, 1941     AdmissionWeight: 44.5 kg                 CurrentWeight: 45.7 kg  Referring provider: Dr Tobie   CHIEF COMPLAINT:   Acute on chronic hypoxemic hypercapnic respiratory failre   HISTORY OF PRESENT ILLNESS   83 yo F with hx of cardiac dysfunction with diastolic CHF, Non hodgkins lymphoma s/p chemoradiation, Sjogrens disease , bronchiectasis with MAI infection who came in for dyspnea on exertion and worsening SOB at rest. She was febrile on arrival with Tmax >101 with leukocytosis, negative for flu/rsv/covid, tachypneic with resp distress.  She had VBG with resp alkalosis due to hyperventilation and severe hypoxemia, she required bipap on admission and was hospitalized for this. She had CXR with r pleural effusion and right lung pneumonia. PCCM for further investigation and management.    05/20/24-patient seen at bedside. She appears to be lethargic and not better from prior.  She does have VBG today with hypercapnia and has been placed on bipap.  She continues to have transient hypotension.  She may benefit from upgrade to SDU if not better although she is DNR/DNI so unlikely to qualify for aggressive care.  Ive reached out to ID for additional input as this patient has been on multi drug antimicrbial therapy daily for past 8 years and is at risk of resistance and non bacterial infectious etiology such as fungal source.  Her viral workup has been negative including RSV/COVID/FLU and RVP and blood cultures are negative.  Son at bedside and wanted to stop rocephin due to concern that antibiotic is causing worsening encephalopathy    May 25, 2024- Patient seen at bedside with multiple family members and chaplain present. Her condition continues to show signs of decline.  She had worsening CXR.  After further discussion with attending family decided to move forward to comfort measures only.  I think this is  appropriate given chronic decline with respiratory failure over past 20 years.   PAST MEDICAL HISTORY   Past Medical History:  Diagnosis Date   Bronchiectasis (HCC) 2011   COPD (chronic obstructive pulmonary disease) (HCC)    HOH (hard of hearing)    Hypertension    Lung nodules    Mycobacterium avium complex colonization    Non-Hodgkin lymphoma (HCC) 2003   Personal history of chemotherapy    Pneumonia    FREQUENT IN PAST    H/O MYCOBACTERIAL   Rheumatoid arthritis (HCC)    Sjogren's syndrome (HCC)    Xerostomia      SURGICAL HISTORY   Past Surgical History:  Procedure Laterality Date   APPENDECTOMY     BRONCHOSCOPY  02/2016   CATARACT EXTRACTION W/PHACO Right 10/26/2016   Procedure: CATARACT EXTRACTION PHACO AND INTRAOCULAR LENS PLACEMENT (IOC);  Surgeon: Steven Dingeldein, MD;  Location: [facility] ORS;  Service: Ophthalmology;  Laterality: Right;  US  2.17 AP% 26.5 CDE 56.86 Fluid pack lot # [id number]   FLEXIBLE BRONCHOSCOPY Bilateral 03/16/2016   Procedure: FLEXIBLE BRONCHOSCOPY;  Surgeon: Elfreda DELENA Bathe, MD;  Location: [facility] OR[facility];  Service: Pulmonary;  Laterality: Bilateral;   FLEXIBLE BRONCHOSCOPY N/A 03/03/2017   Procedure: FLEXIBLE BRONCHOSCOPY;  Surgeon: Elfreda DELENA Bathe, MD;  Location: [facility] OR[facility];  Service: Pulmonary;  Laterality: N/A;   TUBAL LIGATION       FAMILY HISTORY   Family History  Problem Relation Age of Onset  Cancer Sister    Breast cancer Neg Hx      SOCIAL HISTORY   Social History   Tobacco Use   Smoking status: Never   Smokeless tobacco: Never  Vaping Use   Vaping status: Never Used  Substance Use Topics   Alcohol use: No   Drug use: No     MEDICATIONS    Home Medication:    Current Medication:  Current Facility-Administered Medications:    0.9 %  sodium chloride  infusion, , Intravenous, Continuous, Tobie Calix, MD, Last Rate: 75 mL/hr at 2024-06-02 0350, Infusion Verify at 2024-06-02 0350   acetaminophen  (TYLENOL ) tablet 650 mg, 650 mg,  Oral, Q6H PRN, Niu, Xilin, MD   albuterol  (PROVENTIL ) (2.5 MG/3ML) 0.083% nebulizer solution 2.5 mg, 2.5 mg, Inhalation, Q4H PRN, Niu, Xilin, MD   azithromycin  (ZITHROMAX ) tablet 250 mg, 250 mg, Oral, Daily, Niu, Xilin, MD, 250 mg at 05/19/24 0940   bisoprolol  (ZEBETA ) tablet 5 mg, 5 mg, Oral, BID, Patel, Sona, MD   budesonide -glycopyrrolate -formoterol  (BREZTRI ) 160-9-4.8 MCG/ACT inhaler 2 puff, 2 puff, Inhalation, BID, Tobie Calix, MD, 2 puff at 05/20/24 1954   dextromethorphan-guaiFENesin (MUCINEX DM) 30-600 MG per 12 hr tablet 1 tablet, 1 tablet, Oral, BID PRN, Niu, Xilin, MD   enoxaparin  (LOVENOX ) injection 30 mg, 30 mg, Subcutaneous, Q24H, Niu, Xilin, MD, 30 mg at 05/20/24 2126   ethambutol  (MYAMBUTOL ) tablet 400 mg, 400 mg, Oral, Daily, Niu, Xilin, MD, 400 mg at 05/19/24 0940   furosemide  (LASIX ) tablet 40 mg, 40 mg, Oral, Daily, Patel, Sona, MD   hydrALAZINE (APRESOLINE) injection 5 mg, 5 mg, Intravenous, Q2H PRN, Niu, Xilin, MD   ibuprofen (ADVIL) tablet 400 mg, 400 mg, Oral, Once, Niu, Xilin, MD   ipratropium-albuterol  (DUONEB) 0.5-2.5 (3) MG/3ML nebulizer solution 3 mL, 3 mL, Nebulization, TID, Patel, Sona, MD, 3 mL at June 02, 2024 0751   Lifitegrast 5 % SOLN 1 drop, 1 drop, Ophthalmic, BID, Niu, Xilin, MD   midodrine (PROAMATINE) tablet 10 mg, 10 mg, Oral, TID WC, Niu, Xilin, MD, 10 mg at 05/20/24 1808   mirtazapine  (REMERON ) tablet 7.5 mg, 7.5 mg, Oral, QHS, Niu, Xilin, MD, 7.5 mg at 05/20/24 2125   multivitamin with minerals tablet 1 tablet, 1 tablet, Oral, Daily, Patel, Sona, MD, 1 tablet at 05/19/24 0940   omega-3 acid ethyl esters (LOVAZA) capsule 1 g, 1 capsule, Oral, Daily, Patel, Sona, MD, 1 g at 05/19/24 0940   ondansetron  (ZOFRAN ) injection 4 mg, 4 mg, Intravenous, Q8H PRN, Niu, Xilin, MD   Oral care mouth rinse, 15 mL, Mouth Rinse, PRN, Patel, Sona, MD   piperacillin-tazobactam (ZOSYN) IVPB 3.375 g, 3.375 g, Intravenous, Q8H, Epifanio Alm SQUIBB, MD, Last Rate: 12.5 mL/hr at  2024/06/02 0535, 3.375 g at 2024-06-02 0535   rifampin  (RIFADIN ) capsule 150 mg, 150 mg, Oral, Daily, Niu, Xilin, MD    ALLERGIES   Prednisone      REVIEW OF SYSTEMS    Review of Systems:  Gen:  Denies  fever, sweats, chills weigh loss  HEENT: Denies blurred vision, double vision, ear pain, eye pain, hearing loss, nose bleeds, sore throat Cardiac:  No dizziness, chest pain or heaviness, chest tightness,edema Resp:   reports dyspnea chronically  Gi: Denies swallowing difficulty, stomach pain, nausea or vomiting, diarrhea, constipation, bowel incontinence Gu:  Denies bladder incontinence, burning urine Ext:   Denies Joint pain, stiffness or swelling Skin: Denies  skin rash, easy bruising or bleeding or hives Endoc:  Denies polyuria, polydipsia , polyphagia or weight change Psych:  Denies depression, insomnia or hallucinations   Other:  All other systems negative   VS: BP 128/66 (BP Location: Left Arm)   Pulse (!) 115   Temp 98 F (36.7 C) (Axillary)   Resp 20   Ht 5' 1 (1.549 m)   Wt 45.7 kg   SpO2 99%   BMI 19.03 kg/m      PHYSICAL EXAM    GENERAL:NAD, no fevers, chills, no weakness no fatigue HEAD: Normocephalic, atraumatic.  EYES: Pupils equal, round, reactive to light. Extraocular muscles intact. No scleral icterus.  MOUTH: Moist mucosal membrane. Dentition intact. No abscess noted.  EAR, NOSE, THROAT: Clear without exudates. No external lesions.  NECK: Supple. No thyromegaly. No nodules. No JVD.  PULMONARY: decreased breath sounds with mild rhonchi worse at bases bilaterally.  CARDIOVASCULAR: S1 and S2. Regular rate and rhythm. No murmurs, rubs, or gallops. No edema. Pedal pulses 2+ bilaterally.  GASTROINTESTINAL: Soft, nontender, nondistended. No masses. Positive bowel sounds. No hepatosplenomegaly.  MUSCULOSKELETAL: No swelling, clubbing, or edema. Range of motion full in all extremities.  NEUROLOGIC: Cranial nerves II through XII are intact. No gross focal  neurological deficits. Sensation intact. Reflexes intact.  SKIN: No ulceration, lesions, rashes, or cyanosis. Skin warm and dry. Turgor intact.  PSYCHIATRIC: Mood, affect within normal limits. The patient is awake, alert and oriented x 3. Insight, judgment intact.       IMAGING       ASSESSMENT/PLAN   Acute on chronic hypoxemic hypercapnic respiratory failure     -report of possible MAI, will send off cultures for AFB today to check presence of this bacteria     - Respiratory viral panel ordered      - daily CRP      - procalcitonin trend     - MRSA is negative     - RSV/FLU/COVID is negative     -Cefepime  has been changed to rocephin due to encephalopathy  Transient hypotension     Possibly drug related vs due to distributive shock   - cotinue midodrine and wean for MAP goal >65   Type 2 NSTEMI    - diurese as able     - consider cardiology evaluation on outpatient basis   Chronic respiratory failure with hypercapnia    -patient has NIV at home     Bronchiectasis with right middle lobe syndrome    Continue chest TP, continue flutter valve    -CT chest with fluctuation infiltrates since 2011     Thank you for allowing me to participate in the care of this patient.   Patient/Family are satisfied with care plan and all questions have been answered.    Provider disclosure: Patient with at least one acute or chronic illness or injury that poses a threat to life or bodily function and is being managed actively during this encounter.  All of the below services have been performed independently by signing provider:  review of prior documentation from internal and or external health records.  Review of previous and current lab results.  Interview and comprehensive assessment during patient visit today. Review of current and previous chest radiographs/CT scans. Discussion of management and test interpretation with health care team and patient/family.   This document was  prepared using Dragon voice recognition software and may include unintentional dictation errors.     Winna Golla, M.D.  Division of Pulmonary & Critical Care Medicine

## 2024-06-21 DEATH — deceased

## 2024-07-23 ENCOUNTER — Ambulatory Visit: Admitting: Internal Medicine

## 2024-08-08 ENCOUNTER — Other Ambulatory Visit: Payer: Self-pay | Admitting: Internal Medicine

## 2024-08-08 DIAGNOSIS — M810 Age-related osteoporosis without current pathological fracture: Secondary | ICD-10-CM

## 2024-08-15 ENCOUNTER — Ambulatory Visit: Admitting: Cardiovascular Disease

## 2024-09-05 LAB — MTB-RIF NAA WITH AFB CULTURE, SPUTUM

## 2024-11-05 ENCOUNTER — Ambulatory Visit: Payer: Medicare Other | Admitting: Internal Medicine
